# Patient Record
Sex: Male | Born: 1935 | Race: White | Hispanic: No | Smoking: Former smoker
Health system: Southern US, Community
[De-identification: ages and names within clinical notes are randomized; demographics above are authoritative.]

## PROBLEM LIST (undated history)

## (undated) DIAGNOSIS — G20A1 Parkinson's disease without dyskinesia, without mention of fluctuations: Secondary | ICD-10-CM

## (undated) DIAGNOSIS — J45909 Unspecified asthma, uncomplicated: Secondary | ICD-10-CM

## (undated) DIAGNOSIS — H9193 Unspecified hearing loss, bilateral: Secondary | ICD-10-CM

## (undated) DIAGNOSIS — M1990 Unspecified osteoarthritis, unspecified site: Secondary | ICD-10-CM

## (undated) DIAGNOSIS — J439 Emphysema, unspecified: Secondary | ICD-10-CM

## (undated) DIAGNOSIS — G20 Parkinson's disease: Secondary | ICD-10-CM

## (undated) DIAGNOSIS — E785 Hyperlipidemia, unspecified: Secondary | ICD-10-CM

## (undated) DIAGNOSIS — I10 Essential (primary) hypertension: Secondary | ICD-10-CM

## (undated) DIAGNOSIS — I2510 Atherosclerotic heart disease of native coronary artery without angina pectoris: Secondary | ICD-10-CM

## (undated) DIAGNOSIS — J849 Interstitial pulmonary disease, unspecified: Secondary | ICD-10-CM

## (undated) HISTORY — DX: Emphysema, unspecified: J43.9

## (undated) HISTORY — DX: Parkinson's disease without dyskinesia, without mention of fluctuations: G20.A1

## (undated) HISTORY — DX: Parkinson's disease: G20

## (undated) HISTORY — DX: Unspecified osteoarthritis, unspecified site: M19.90

## (undated) HISTORY — DX: Unspecified asthma, uncomplicated: J45.909

## (undated) HISTORY — DX: Unspecified hearing loss, bilateral: H91.93

## (undated) HISTORY — PX: CORONARY ANGIOPLASTY WITH STENT PLACEMENT: SHX49

## (undated) HISTORY — DX: Essential (primary) hypertension: I10

## (undated) HISTORY — DX: Interstitial pulmonary disease, unspecified: J84.9

## (undated) HISTORY — DX: Hyperlipidemia, unspecified: E78.5

## (undated) HISTORY — PX: VEIN BYPASS SURGERY: SHX833

---

## 1990-02-06 HISTORY — PX: CORONARY ARTERY BYPASS GRAFT: SHX141

## 2013-10-07 HISTORY — PX: HERNIA REPAIR: SHX51

## 2014-02-12 DIAGNOSIS — Z9861 Coronary angioplasty status: Secondary | ICD-10-CM | POA: Diagnosis not present

## 2014-02-12 DIAGNOSIS — I2510 Atherosclerotic heart disease of native coronary artery without angina pectoris: Secondary | ICD-10-CM | POA: Diagnosis not present

## 2014-02-16 DIAGNOSIS — Z9861 Coronary angioplasty status: Secondary | ICD-10-CM | POA: Diagnosis not present

## 2014-02-16 DIAGNOSIS — I2510 Atherosclerotic heart disease of native coronary artery without angina pectoris: Secondary | ICD-10-CM | POA: Diagnosis not present

## 2014-02-18 DIAGNOSIS — Z9861 Coronary angioplasty status: Secondary | ICD-10-CM | POA: Diagnosis not present

## 2014-02-18 DIAGNOSIS — I2510 Atherosclerotic heart disease of native coronary artery without angina pectoris: Secondary | ICD-10-CM | POA: Diagnosis not present

## 2014-02-20 DIAGNOSIS — Z9861 Coronary angioplasty status: Secondary | ICD-10-CM | POA: Diagnosis not present

## 2014-02-20 DIAGNOSIS — I2510 Atherosclerotic heart disease of native coronary artery without angina pectoris: Secondary | ICD-10-CM | POA: Diagnosis not present

## 2014-02-25 DIAGNOSIS — I2510 Atherosclerotic heart disease of native coronary artery without angina pectoris: Secondary | ICD-10-CM | POA: Diagnosis not present

## 2014-02-25 DIAGNOSIS — Z9861 Coronary angioplasty status: Secondary | ICD-10-CM | POA: Diagnosis not present

## 2014-03-02 DIAGNOSIS — J441 Chronic obstructive pulmonary disease with (acute) exacerbation: Secondary | ICD-10-CM | POA: Diagnosis not present

## 2014-03-02 DIAGNOSIS — J45909 Unspecified asthma, uncomplicated: Secondary | ICD-10-CM | POA: Diagnosis not present

## 2014-03-02 DIAGNOSIS — R000 Tachycardia, unspecified: Secondary | ICD-10-CM | POA: Diagnosis not present

## 2014-03-02 DIAGNOSIS — I2510 Atherosclerotic heart disease of native coronary artery without angina pectoris: Secondary | ICD-10-CM | POA: Diagnosis not present

## 2014-03-02 DIAGNOSIS — R0600 Dyspnea, unspecified: Secondary | ICD-10-CM | POA: Diagnosis not present

## 2014-03-02 DIAGNOSIS — R05 Cough: Secondary | ICD-10-CM | POA: Diagnosis not present

## 2014-03-02 DIAGNOSIS — J90 Pleural effusion, not elsewhere classified: Secondary | ICD-10-CM | POA: Diagnosis not present

## 2014-03-02 DIAGNOSIS — J9611 Chronic respiratory failure with hypoxia: Secondary | ICD-10-CM | POA: Diagnosis not present

## 2014-03-02 DIAGNOSIS — J9811 Atelectasis: Secondary | ICD-10-CM | POA: Diagnosis not present

## 2014-03-02 DIAGNOSIS — J449 Chronic obstructive pulmonary disease, unspecified: Secondary | ICD-10-CM | POA: Diagnosis not present

## 2014-03-02 DIAGNOSIS — R0602 Shortness of breath: Secondary | ICD-10-CM | POA: Diagnosis not present

## 2014-03-02 DIAGNOSIS — J984 Other disorders of lung: Secondary | ICD-10-CM | POA: Diagnosis not present

## 2014-03-02 DIAGNOSIS — R0902 Hypoxemia: Secondary | ICD-10-CM | POA: Diagnosis not present

## 2014-03-03 DIAGNOSIS — J90 Pleural effusion, not elsewhere classified: Secondary | ICD-10-CM | POA: Diagnosis present

## 2014-03-03 DIAGNOSIS — J441 Chronic obstructive pulmonary disease with (acute) exacerbation: Secondary | ICD-10-CM | POA: Diagnosis not present

## 2014-03-03 DIAGNOSIS — I2510 Atherosclerotic heart disease of native coronary artery without angina pectoris: Secondary | ICD-10-CM | POA: Diagnosis present

## 2014-03-03 DIAGNOSIS — I10 Essential (primary) hypertension: Secondary | ICD-10-CM | POA: Diagnosis present

## 2014-03-03 DIAGNOSIS — R05 Cough: Secondary | ICD-10-CM | POA: Diagnosis not present

## 2014-03-03 DIAGNOSIS — J40 Bronchitis, not specified as acute or chronic: Secondary | ICD-10-CM | POA: Diagnosis present

## 2014-03-03 DIAGNOSIS — J9690 Respiratory failure, unspecified, unspecified whether with hypoxia or hypercapnia: Secondary | ICD-10-CM | POA: Diagnosis not present

## 2014-03-03 DIAGNOSIS — J9811 Atelectasis: Secondary | ICD-10-CM | POA: Diagnosis present

## 2014-03-03 DIAGNOSIS — Z955 Presence of coronary angioplasty implant and graft: Secondary | ICD-10-CM | POA: Diagnosis not present

## 2014-03-03 DIAGNOSIS — J449 Chronic obstructive pulmonary disease, unspecified: Secondary | ICD-10-CM | POA: Diagnosis not present

## 2014-03-03 DIAGNOSIS — G20 Parkinson's disease: Secondary | ICD-10-CM | POA: Diagnosis present

## 2014-03-03 DIAGNOSIS — J9611 Chronic respiratory failure with hypoxia: Secondary | ICD-10-CM | POA: Diagnosis present

## 2014-03-03 DIAGNOSIS — J45909 Unspecified asthma, uncomplicated: Secondary | ICD-10-CM | POA: Diagnosis present

## 2014-03-03 DIAGNOSIS — R0902 Hypoxemia: Secondary | ICD-10-CM | POA: Diagnosis not present

## 2014-03-03 DIAGNOSIS — R0602 Shortness of breath: Secondary | ICD-10-CM | POA: Diagnosis not present

## 2014-03-03 DIAGNOSIS — I252 Old myocardial infarction: Secondary | ICD-10-CM | POA: Diagnosis not present

## 2014-03-03 DIAGNOSIS — Z951 Presence of aortocoronary bypass graft: Secondary | ICD-10-CM | POA: Diagnosis not present

## 2014-03-05 DIAGNOSIS — J441 Chronic obstructive pulmonary disease with (acute) exacerbation: Secondary | ICD-10-CM | POA: Diagnosis not present

## 2014-03-09 HISTORY — PX: CARPAL TUNNEL RELEASE: SHX101

## 2014-03-17 DIAGNOSIS — I2510 Atherosclerotic heart disease of native coronary artery without angina pectoris: Secondary | ICD-10-CM | POA: Diagnosis not present

## 2014-03-17 DIAGNOSIS — E782 Mixed hyperlipidemia: Secondary | ICD-10-CM | POA: Diagnosis not present

## 2014-03-17 DIAGNOSIS — R5383 Other fatigue: Secondary | ICD-10-CM | POA: Diagnosis not present

## 2014-03-17 DIAGNOSIS — Z955 Presence of coronary angioplasty implant and graft: Secondary | ICD-10-CM | POA: Diagnosis not present

## 2014-03-17 DIAGNOSIS — I10 Essential (primary) hypertension: Secondary | ICD-10-CM | POA: Diagnosis not present

## 2014-03-17 DIAGNOSIS — Z9861 Coronary angioplasty status: Secondary | ICD-10-CM | POA: Diagnosis not present

## 2014-03-23 DIAGNOSIS — J449 Chronic obstructive pulmonary disease, unspecified: Secondary | ICD-10-CM | POA: Diagnosis not present

## 2014-03-23 DIAGNOSIS — E785 Hyperlipidemia, unspecified: Secondary | ICD-10-CM | POA: Diagnosis not present

## 2014-04-02 DIAGNOSIS — J449 Chronic obstructive pulmonary disease, unspecified: Secondary | ICD-10-CM | POA: Diagnosis not present

## 2014-04-02 DIAGNOSIS — R0901 Asphyxia: Secondary | ICD-10-CM | POA: Diagnosis not present

## 2014-04-07 DIAGNOSIS — Z955 Presence of coronary angioplasty implant and graft: Secondary | ICD-10-CM | POA: Diagnosis not present

## 2014-04-07 DIAGNOSIS — E782 Mixed hyperlipidemia: Secondary | ICD-10-CM | POA: Diagnosis not present

## 2014-04-07 DIAGNOSIS — I2510 Atherosclerotic heart disease of native coronary artery without angina pectoris: Secondary | ICD-10-CM | POA: Diagnosis not present

## 2014-04-07 DIAGNOSIS — Z9861 Coronary angioplasty status: Secondary | ICD-10-CM | POA: Diagnosis not present

## 2014-04-07 DIAGNOSIS — I10 Essential (primary) hypertension: Secondary | ICD-10-CM | POA: Diagnosis not present

## 2014-04-09 DIAGNOSIS — H3532 Exudative age-related macular degeneration: Secondary | ICD-10-CM | POA: Diagnosis not present

## 2014-04-10 DIAGNOSIS — J449 Chronic obstructive pulmonary disease, unspecified: Secondary | ICD-10-CM | POA: Diagnosis not present

## 2014-04-10 DIAGNOSIS — Z7982 Long term (current) use of aspirin: Secondary | ICD-10-CM | POA: Diagnosis not present

## 2014-04-10 DIAGNOSIS — G20 Parkinson's disease: Secondary | ICD-10-CM | POA: Diagnosis not present

## 2014-04-10 DIAGNOSIS — Z7951 Long term (current) use of inhaled steroids: Secondary | ICD-10-CM | POA: Diagnosis not present

## 2014-04-10 DIAGNOSIS — Z955 Presence of coronary angioplasty implant and graft: Secondary | ICD-10-CM | POA: Diagnosis not present

## 2014-04-10 DIAGNOSIS — E039 Hypothyroidism, unspecified: Secondary | ICD-10-CM | POA: Diagnosis not present

## 2014-04-10 DIAGNOSIS — J45909 Unspecified asthma, uncomplicated: Secondary | ICD-10-CM | POA: Diagnosis not present

## 2014-04-10 DIAGNOSIS — Z7902 Long term (current) use of antithrombotics/antiplatelets: Secondary | ICD-10-CM | POA: Diagnosis not present

## 2014-04-10 DIAGNOSIS — M1990 Unspecified osteoarthritis, unspecified site: Secondary | ICD-10-CM | POA: Diagnosis not present

## 2014-04-10 DIAGNOSIS — I2510 Atherosclerotic heart disease of native coronary artery without angina pectoris: Secondary | ICD-10-CM | POA: Diagnosis not present

## 2014-04-10 DIAGNOSIS — I252 Old myocardial infarction: Secondary | ICD-10-CM | POA: Diagnosis not present

## 2014-04-10 DIAGNOSIS — I10 Essential (primary) hypertension: Secondary | ICD-10-CM | POA: Diagnosis not present

## 2014-04-10 DIAGNOSIS — J9611 Chronic respiratory failure with hypoxia: Secondary | ICD-10-CM | POA: Diagnosis not present

## 2014-04-10 DIAGNOSIS — Z951 Presence of aortocoronary bypass graft: Secondary | ICD-10-CM | POA: Diagnosis not present

## 2014-04-10 DIAGNOSIS — E785 Hyperlipidemia, unspecified: Secondary | ICD-10-CM | POA: Diagnosis not present

## 2014-04-10 DIAGNOSIS — Z9981 Dependence on supplemental oxygen: Secondary | ICD-10-CM | POA: Diagnosis not present

## 2014-04-13 DIAGNOSIS — G20 Parkinson's disease: Secondary | ICD-10-CM | POA: Diagnosis not present

## 2014-04-13 DIAGNOSIS — M1990 Unspecified osteoarthritis, unspecified site: Secondary | ICD-10-CM | POA: Diagnosis not present

## 2014-04-13 DIAGNOSIS — J9611 Chronic respiratory failure with hypoxia: Secondary | ICD-10-CM | POA: Diagnosis not present

## 2014-04-13 DIAGNOSIS — I2510 Atherosclerotic heart disease of native coronary artery without angina pectoris: Secondary | ICD-10-CM | POA: Diagnosis not present

## 2014-04-13 DIAGNOSIS — I10 Essential (primary) hypertension: Secondary | ICD-10-CM | POA: Diagnosis not present

## 2014-04-13 DIAGNOSIS — J449 Chronic obstructive pulmonary disease, unspecified: Secondary | ICD-10-CM | POA: Diagnosis not present

## 2014-04-14 DIAGNOSIS — J449 Chronic obstructive pulmonary disease, unspecified: Secondary | ICD-10-CM | POA: Diagnosis not present

## 2014-04-14 DIAGNOSIS — I2510 Atherosclerotic heart disease of native coronary artery without angina pectoris: Secondary | ICD-10-CM | POA: Diagnosis not present

## 2014-04-14 DIAGNOSIS — M1990 Unspecified osteoarthritis, unspecified site: Secondary | ICD-10-CM | POA: Diagnosis not present

## 2014-04-14 DIAGNOSIS — I10 Essential (primary) hypertension: Secondary | ICD-10-CM | POA: Diagnosis not present

## 2014-04-14 DIAGNOSIS — J9611 Chronic respiratory failure with hypoxia: Secondary | ICD-10-CM | POA: Diagnosis not present

## 2014-04-14 DIAGNOSIS — G20 Parkinson's disease: Secondary | ICD-10-CM | POA: Diagnosis not present

## 2014-04-17 DIAGNOSIS — I10 Essential (primary) hypertension: Secondary | ICD-10-CM | POA: Diagnosis not present

## 2014-04-17 DIAGNOSIS — J9611 Chronic respiratory failure with hypoxia: Secondary | ICD-10-CM | POA: Diagnosis not present

## 2014-04-17 DIAGNOSIS — G20 Parkinson's disease: Secondary | ICD-10-CM | POA: Diagnosis not present

## 2014-04-17 DIAGNOSIS — J449 Chronic obstructive pulmonary disease, unspecified: Secondary | ICD-10-CM | POA: Diagnosis not present

## 2014-04-17 DIAGNOSIS — I2510 Atherosclerotic heart disease of native coronary artery without angina pectoris: Secondary | ICD-10-CM | POA: Diagnosis not present

## 2014-04-17 DIAGNOSIS — M1990 Unspecified osteoarthritis, unspecified site: Secondary | ICD-10-CM | POA: Diagnosis not present

## 2014-04-20 DIAGNOSIS — G20 Parkinson's disease: Secondary | ICD-10-CM | POA: Diagnosis not present

## 2014-04-20 DIAGNOSIS — M1990 Unspecified osteoarthritis, unspecified site: Secondary | ICD-10-CM | POA: Diagnosis not present

## 2014-04-20 DIAGNOSIS — J449 Chronic obstructive pulmonary disease, unspecified: Secondary | ICD-10-CM | POA: Diagnosis not present

## 2014-04-20 DIAGNOSIS — I10 Essential (primary) hypertension: Secondary | ICD-10-CM | POA: Diagnosis not present

## 2014-04-20 DIAGNOSIS — I2510 Atherosclerotic heart disease of native coronary artery without angina pectoris: Secondary | ICD-10-CM | POA: Diagnosis not present

## 2014-04-20 DIAGNOSIS — J9611 Chronic respiratory failure with hypoxia: Secondary | ICD-10-CM | POA: Diagnosis not present

## 2014-04-21 DIAGNOSIS — Z9981 Dependence on supplemental oxygen: Secondary | ICD-10-CM | POA: Diagnosis not present

## 2014-04-21 DIAGNOSIS — R55 Syncope and collapse: Secondary | ICD-10-CM | POA: Diagnosis not present

## 2014-04-21 DIAGNOSIS — R1310 Dysphagia, unspecified: Secondary | ICD-10-CM | POA: Diagnosis not present

## 2014-04-21 DIAGNOSIS — R0602 Shortness of breath: Secondary | ICD-10-CM | POA: Diagnosis not present

## 2014-04-21 DIAGNOSIS — J9611 Chronic respiratory failure with hypoxia: Secondary | ICD-10-CM | POA: Diagnosis not present

## 2014-04-21 DIAGNOSIS — R072 Precordial pain: Secondary | ICD-10-CM | POA: Diagnosis not present

## 2014-04-21 DIAGNOSIS — J849 Interstitial pulmonary disease, unspecified: Secondary | ICD-10-CM | POA: Diagnosis not present

## 2014-04-21 DIAGNOSIS — D509 Iron deficiency anemia, unspecified: Secondary | ICD-10-CM | POA: Diagnosis not present

## 2014-04-22 DIAGNOSIS — Z7982 Long term (current) use of aspirin: Secondary | ICD-10-CM | POA: Diagnosis not present

## 2014-04-22 DIAGNOSIS — I480 Paroxysmal atrial fibrillation: Secondary | ICD-10-CM | POA: Diagnosis not present

## 2014-04-22 DIAGNOSIS — E039 Hypothyroidism, unspecified: Secondary | ICD-10-CM | POA: Diagnosis present

## 2014-04-22 DIAGNOSIS — R1310 Dysphagia, unspecified: Secondary | ICD-10-CM | POA: Diagnosis present

## 2014-04-22 DIAGNOSIS — J449 Chronic obstructive pulmonary disease, unspecified: Secondary | ICD-10-CM | POA: Diagnosis present

## 2014-04-22 DIAGNOSIS — D500 Iron deficiency anemia secondary to blood loss (chronic): Secondary | ICD-10-CM | POA: Diagnosis not present

## 2014-04-22 DIAGNOSIS — I6523 Occlusion and stenosis of bilateral carotid arteries: Secondary | ICD-10-CM | POA: Diagnosis not present

## 2014-04-22 DIAGNOSIS — Z7902 Long term (current) use of antithrombotics/antiplatelets: Secondary | ICD-10-CM | POA: Diagnosis not present

## 2014-04-22 DIAGNOSIS — R51 Headache: Secondary | ICD-10-CM | POA: Diagnosis not present

## 2014-04-22 DIAGNOSIS — Z79899 Other long term (current) drug therapy: Secondary | ICD-10-CM | POA: Diagnosis not present

## 2014-04-22 DIAGNOSIS — R062 Wheezing: Secondary | ICD-10-CM | POA: Diagnosis not present

## 2014-04-22 DIAGNOSIS — Z951 Presence of aortocoronary bypass graft: Secondary | ICD-10-CM | POA: Diagnosis not present

## 2014-04-22 DIAGNOSIS — J45909 Unspecified asthma, uncomplicated: Secondary | ICD-10-CM | POA: Diagnosis present

## 2014-04-22 DIAGNOSIS — G20 Parkinson's disease: Secondary | ICD-10-CM | POA: Diagnosis present

## 2014-04-22 DIAGNOSIS — Z955 Presence of coronary angioplasty implant and graft: Secondary | ICD-10-CM | POA: Diagnosis not present

## 2014-04-22 DIAGNOSIS — J9611 Chronic respiratory failure with hypoxia: Secondary | ICD-10-CM | POA: Diagnosis present

## 2014-04-22 DIAGNOSIS — J439 Emphysema, unspecified: Secondary | ICD-10-CM | POA: Diagnosis not present

## 2014-04-22 DIAGNOSIS — E119 Type 2 diabetes mellitus without complications: Secondary | ICD-10-CM | POA: Diagnosis present

## 2014-04-22 DIAGNOSIS — R55 Syncope and collapse: Secondary | ICD-10-CM | POA: Diagnosis not present

## 2014-04-22 DIAGNOSIS — E785 Hyperlipidemia, unspecified: Secondary | ICD-10-CM | POA: Diagnosis present

## 2014-04-22 DIAGNOSIS — Z9981 Dependence on supplemental oxygen: Secondary | ICD-10-CM | POA: Diagnosis not present

## 2014-04-22 DIAGNOSIS — I2510 Atherosclerotic heart disease of native coronary artery without angina pectoris: Secondary | ICD-10-CM | POA: Diagnosis present

## 2014-04-22 DIAGNOSIS — T17300A Unspecified foreign body in larynx causing asphyxiation, initial encounter: Secondary | ICD-10-CM | POA: Diagnosis not present

## 2014-04-22 DIAGNOSIS — D509 Iron deficiency anemia, unspecified: Secondary | ICD-10-CM | POA: Diagnosis present

## 2014-04-22 DIAGNOSIS — Z87891 Personal history of nicotine dependence: Secondary | ICD-10-CM | POA: Diagnosis not present

## 2014-04-22 DIAGNOSIS — Z888 Allergy status to other drugs, medicaments and biological substances status: Secondary | ICD-10-CM | POA: Diagnosis not present

## 2014-04-22 DIAGNOSIS — I252 Old myocardial infarction: Secondary | ICD-10-CM | POA: Diagnosis not present

## 2014-04-22 DIAGNOSIS — I10 Essential (primary) hypertension: Secondary | ICD-10-CM | POA: Diagnosis present

## 2014-04-22 DIAGNOSIS — R072 Precordial pain: Secondary | ICD-10-CM | POA: Diagnosis not present

## 2014-04-22 DIAGNOSIS — J849 Interstitial pulmonary disease, unspecified: Secondary | ICD-10-CM | POA: Diagnosis present

## 2014-04-22 DIAGNOSIS — J8410 Pulmonary fibrosis, unspecified: Secondary | ICD-10-CM | POA: Diagnosis not present

## 2014-04-26 DIAGNOSIS — G20 Parkinson's disease: Secondary | ICD-10-CM | POA: Diagnosis not present

## 2014-04-26 DIAGNOSIS — J449 Chronic obstructive pulmonary disease, unspecified: Secondary | ICD-10-CM | POA: Diagnosis not present

## 2014-04-26 DIAGNOSIS — M1990 Unspecified osteoarthritis, unspecified site: Secondary | ICD-10-CM | POA: Diagnosis not present

## 2014-04-26 DIAGNOSIS — J9611 Chronic respiratory failure with hypoxia: Secondary | ICD-10-CM | POA: Diagnosis not present

## 2014-04-26 DIAGNOSIS — I10 Essential (primary) hypertension: Secondary | ICD-10-CM | POA: Diagnosis not present

## 2014-04-26 DIAGNOSIS — I2510 Atherosclerotic heart disease of native coronary artery without angina pectoris: Secondary | ICD-10-CM | POA: Diagnosis not present

## 2014-04-28 DIAGNOSIS — M1990 Unspecified osteoarthritis, unspecified site: Secondary | ICD-10-CM | POA: Diagnosis not present

## 2014-04-28 DIAGNOSIS — G20 Parkinson's disease: Secondary | ICD-10-CM | POA: Diagnosis not present

## 2014-04-28 DIAGNOSIS — J449 Chronic obstructive pulmonary disease, unspecified: Secondary | ICD-10-CM | POA: Diagnosis not present

## 2014-04-28 DIAGNOSIS — J9611 Chronic respiratory failure with hypoxia: Secondary | ICD-10-CM | POA: Diagnosis not present

## 2014-04-28 DIAGNOSIS — I10 Essential (primary) hypertension: Secondary | ICD-10-CM | POA: Diagnosis not present

## 2014-04-28 DIAGNOSIS — I2510 Atherosclerotic heart disease of native coronary artery without angina pectoris: Secondary | ICD-10-CM | POA: Diagnosis not present

## 2014-04-29 DIAGNOSIS — I10 Essential (primary) hypertension: Secondary | ICD-10-CM | POA: Diagnosis not present

## 2014-04-29 DIAGNOSIS — G20 Parkinson's disease: Secondary | ICD-10-CM | POA: Diagnosis not present

## 2014-04-29 DIAGNOSIS — J111 Influenza due to unidentified influenza virus with other respiratory manifestations: Secondary | ICD-10-CM | POA: Diagnosis not present

## 2014-04-29 DIAGNOSIS — J449 Chronic obstructive pulmonary disease, unspecified: Secondary | ICD-10-CM | POA: Diagnosis not present

## 2014-04-29 DIAGNOSIS — R918 Other nonspecific abnormal finding of lung field: Secondary | ICD-10-CM | POA: Diagnosis not present

## 2014-04-29 DIAGNOSIS — M1990 Unspecified osteoarthritis, unspecified site: Secondary | ICD-10-CM | POA: Diagnosis not present

## 2014-04-29 DIAGNOSIS — R05 Cough: Secondary | ICD-10-CM | POA: Diagnosis not present

## 2014-04-29 DIAGNOSIS — R509 Fever, unspecified: Secondary | ICD-10-CM | POA: Diagnosis not present

## 2014-04-29 DIAGNOSIS — I2510 Atherosclerotic heart disease of native coronary artery without angina pectoris: Secondary | ICD-10-CM | POA: Diagnosis not present

## 2014-04-29 DIAGNOSIS — J9611 Chronic respiratory failure with hypoxia: Secondary | ICD-10-CM | POA: Diagnosis not present

## 2014-04-30 DIAGNOSIS — Z961 Presence of intraocular lens: Secondary | ICD-10-CM | POA: Diagnosis not present

## 2014-04-30 DIAGNOSIS — H3531 Nonexudative age-related macular degeneration: Secondary | ICD-10-CM | POA: Diagnosis not present

## 2014-04-30 DIAGNOSIS — J449 Chronic obstructive pulmonary disease, unspecified: Secondary | ICD-10-CM | POA: Diagnosis not present

## 2014-04-30 DIAGNOSIS — I2510 Atherosclerotic heart disease of native coronary artery without angina pectoris: Secondary | ICD-10-CM | POA: Diagnosis not present

## 2014-04-30 DIAGNOSIS — H3532 Exudative age-related macular degeneration: Secondary | ICD-10-CM | POA: Diagnosis not present

## 2014-04-30 DIAGNOSIS — H04123 Dry eye syndrome of bilateral lacrimal glands: Secondary | ICD-10-CM | POA: Diagnosis not present

## 2014-04-30 DIAGNOSIS — M1990 Unspecified osteoarthritis, unspecified site: Secondary | ICD-10-CM | POA: Diagnosis not present

## 2014-04-30 DIAGNOSIS — G20 Parkinson's disease: Secondary | ICD-10-CM | POA: Diagnosis not present

## 2014-04-30 DIAGNOSIS — J9611 Chronic respiratory failure with hypoxia: Secondary | ICD-10-CM | POA: Diagnosis not present

## 2014-04-30 DIAGNOSIS — I10 Essential (primary) hypertension: Secondary | ICD-10-CM | POA: Diagnosis not present

## 2014-05-01 DIAGNOSIS — I10 Essential (primary) hypertension: Secondary | ICD-10-CM | POA: Diagnosis not present

## 2014-05-01 DIAGNOSIS — J9611 Chronic respiratory failure with hypoxia: Secondary | ICD-10-CM | POA: Diagnosis not present

## 2014-05-01 DIAGNOSIS — I2510 Atherosclerotic heart disease of native coronary artery without angina pectoris: Secondary | ICD-10-CM | POA: Diagnosis not present

## 2014-05-01 DIAGNOSIS — G20 Parkinson's disease: Secondary | ICD-10-CM | POA: Diagnosis not present

## 2014-05-01 DIAGNOSIS — M1990 Unspecified osteoarthritis, unspecified site: Secondary | ICD-10-CM | POA: Diagnosis not present

## 2014-05-01 DIAGNOSIS — J449 Chronic obstructive pulmonary disease, unspecified: Secondary | ICD-10-CM | POA: Diagnosis not present

## 2014-05-02 DIAGNOSIS — I10 Essential (primary) hypertension: Secondary | ICD-10-CM | POA: Diagnosis not present

## 2014-05-02 DIAGNOSIS — G20 Parkinson's disease: Secondary | ICD-10-CM | POA: Diagnosis not present

## 2014-05-02 DIAGNOSIS — M1990 Unspecified osteoarthritis, unspecified site: Secondary | ICD-10-CM | POA: Diagnosis not present

## 2014-05-02 DIAGNOSIS — J449 Chronic obstructive pulmonary disease, unspecified: Secondary | ICD-10-CM | POA: Diagnosis not present

## 2014-05-02 DIAGNOSIS — I2510 Atherosclerotic heart disease of native coronary artery without angina pectoris: Secondary | ICD-10-CM | POA: Diagnosis not present

## 2014-05-02 DIAGNOSIS — J9611 Chronic respiratory failure with hypoxia: Secondary | ICD-10-CM | POA: Diagnosis not present

## 2014-05-04 DIAGNOSIS — J449 Chronic obstructive pulmonary disease, unspecified: Secondary | ICD-10-CM | POA: Diagnosis not present

## 2014-05-04 DIAGNOSIS — M1990 Unspecified osteoarthritis, unspecified site: Secondary | ICD-10-CM | POA: Diagnosis not present

## 2014-05-04 DIAGNOSIS — I2510 Atherosclerotic heart disease of native coronary artery without angina pectoris: Secondary | ICD-10-CM | POA: Diagnosis not present

## 2014-05-04 DIAGNOSIS — G20 Parkinson's disease: Secondary | ICD-10-CM | POA: Diagnosis not present

## 2014-05-04 DIAGNOSIS — I10 Essential (primary) hypertension: Secondary | ICD-10-CM | POA: Diagnosis not present

## 2014-05-04 DIAGNOSIS — J9611 Chronic respiratory failure with hypoxia: Secondary | ICD-10-CM | POA: Diagnosis not present

## 2014-05-05 DIAGNOSIS — I10 Essential (primary) hypertension: Secondary | ICD-10-CM | POA: Diagnosis not present

## 2014-05-05 DIAGNOSIS — G20 Parkinson's disease: Secondary | ICD-10-CM | POA: Diagnosis not present

## 2014-05-05 DIAGNOSIS — R0901 Asphyxia: Secondary | ICD-10-CM | POA: Diagnosis not present

## 2014-05-05 DIAGNOSIS — M1990 Unspecified osteoarthritis, unspecified site: Secondary | ICD-10-CM | POA: Diagnosis not present

## 2014-05-05 DIAGNOSIS — R0600 Dyspnea, unspecified: Secondary | ICD-10-CM | POA: Diagnosis not present

## 2014-05-05 DIAGNOSIS — I2510 Atherosclerotic heart disease of native coronary artery without angina pectoris: Secondary | ICD-10-CM | POA: Diagnosis not present

## 2014-05-05 DIAGNOSIS — J9611 Chronic respiratory failure with hypoxia: Secondary | ICD-10-CM | POA: Diagnosis not present

## 2014-05-05 DIAGNOSIS — J849 Interstitial pulmonary disease, unspecified: Secondary | ICD-10-CM | POA: Diagnosis not present

## 2014-05-05 DIAGNOSIS — J449 Chronic obstructive pulmonary disease, unspecified: Secondary | ICD-10-CM | POA: Diagnosis not present

## 2014-05-07 DIAGNOSIS — M1990 Unspecified osteoarthritis, unspecified site: Secondary | ICD-10-CM | POA: Diagnosis not present

## 2014-05-07 DIAGNOSIS — J449 Chronic obstructive pulmonary disease, unspecified: Secondary | ICD-10-CM | POA: Diagnosis not present

## 2014-05-07 DIAGNOSIS — I10 Essential (primary) hypertension: Secondary | ICD-10-CM | POA: Diagnosis not present

## 2014-05-07 DIAGNOSIS — G20 Parkinson's disease: Secondary | ICD-10-CM | POA: Diagnosis not present

## 2014-05-07 DIAGNOSIS — I2510 Atherosclerotic heart disease of native coronary artery without angina pectoris: Secondary | ICD-10-CM | POA: Diagnosis not present

## 2014-05-07 DIAGNOSIS — J9611 Chronic respiratory failure with hypoxia: Secondary | ICD-10-CM | POA: Diagnosis not present

## 2014-05-11 DIAGNOSIS — Z09 Encounter for follow-up examination after completed treatment for conditions other than malignant neoplasm: Secondary | ICD-10-CM | POA: Diagnosis not present

## 2014-05-11 DIAGNOSIS — G20 Parkinson's disease: Secondary | ICD-10-CM | POA: Diagnosis not present

## 2014-05-12 DIAGNOSIS — I2510 Atherosclerotic heart disease of native coronary artery without angina pectoris: Secondary | ICD-10-CM | POA: Diagnosis not present

## 2014-05-12 DIAGNOSIS — G20 Parkinson's disease: Secondary | ICD-10-CM | POA: Diagnosis not present

## 2014-05-12 DIAGNOSIS — J449 Chronic obstructive pulmonary disease, unspecified: Secondary | ICD-10-CM | POA: Diagnosis not present

## 2014-05-12 DIAGNOSIS — M1990 Unspecified osteoarthritis, unspecified site: Secondary | ICD-10-CM | POA: Diagnosis not present

## 2014-05-12 DIAGNOSIS — J9611 Chronic respiratory failure with hypoxia: Secondary | ICD-10-CM | POA: Diagnosis not present

## 2014-05-12 DIAGNOSIS — I10 Essential (primary) hypertension: Secondary | ICD-10-CM | POA: Diagnosis not present

## 2014-05-14 DIAGNOSIS — J9611 Chronic respiratory failure with hypoxia: Secondary | ICD-10-CM | POA: Diagnosis not present

## 2014-05-14 DIAGNOSIS — J449 Chronic obstructive pulmonary disease, unspecified: Secondary | ICD-10-CM | POA: Diagnosis not present

## 2014-05-14 DIAGNOSIS — G20 Parkinson's disease: Secondary | ICD-10-CM | POA: Diagnosis not present

## 2014-05-14 DIAGNOSIS — M1990 Unspecified osteoarthritis, unspecified site: Secondary | ICD-10-CM | POA: Diagnosis not present

## 2014-05-14 DIAGNOSIS — I2510 Atherosclerotic heart disease of native coronary artery without angina pectoris: Secondary | ICD-10-CM | POA: Diagnosis not present

## 2014-05-14 DIAGNOSIS — I10 Essential (primary) hypertension: Secondary | ICD-10-CM | POA: Diagnosis not present

## 2014-05-15 DIAGNOSIS — M1990 Unspecified osteoarthritis, unspecified site: Secondary | ICD-10-CM | POA: Diagnosis not present

## 2014-05-15 DIAGNOSIS — J9611 Chronic respiratory failure with hypoxia: Secondary | ICD-10-CM | POA: Diagnosis not present

## 2014-05-15 DIAGNOSIS — I2510 Atherosclerotic heart disease of native coronary artery without angina pectoris: Secondary | ICD-10-CM | POA: Diagnosis not present

## 2014-05-15 DIAGNOSIS — I10 Essential (primary) hypertension: Secondary | ICD-10-CM | POA: Diagnosis not present

## 2014-05-15 DIAGNOSIS — J449 Chronic obstructive pulmonary disease, unspecified: Secondary | ICD-10-CM | POA: Diagnosis not present

## 2014-05-15 DIAGNOSIS — G20 Parkinson's disease: Secondary | ICD-10-CM | POA: Diagnosis not present

## 2014-05-19 DIAGNOSIS — J449 Chronic obstructive pulmonary disease, unspecified: Secondary | ICD-10-CM | POA: Diagnosis not present

## 2014-05-19 DIAGNOSIS — I2510 Atherosclerotic heart disease of native coronary artery without angina pectoris: Secondary | ICD-10-CM | POA: Diagnosis not present

## 2014-05-19 DIAGNOSIS — J9611 Chronic respiratory failure with hypoxia: Secondary | ICD-10-CM | POA: Diagnosis not present

## 2014-05-19 DIAGNOSIS — G20 Parkinson's disease: Secondary | ICD-10-CM | POA: Diagnosis not present

## 2014-05-19 DIAGNOSIS — I10 Essential (primary) hypertension: Secondary | ICD-10-CM | POA: Diagnosis not present

## 2014-05-19 DIAGNOSIS — M1990 Unspecified osteoarthritis, unspecified site: Secondary | ICD-10-CM | POA: Diagnosis not present

## 2014-05-21 DIAGNOSIS — J449 Chronic obstructive pulmonary disease, unspecified: Secondary | ICD-10-CM | POA: Diagnosis not present

## 2014-05-21 DIAGNOSIS — M1990 Unspecified osteoarthritis, unspecified site: Secondary | ICD-10-CM | POA: Diagnosis not present

## 2014-05-21 DIAGNOSIS — J9611 Chronic respiratory failure with hypoxia: Secondary | ICD-10-CM | POA: Diagnosis not present

## 2014-05-21 DIAGNOSIS — G20 Parkinson's disease: Secondary | ICD-10-CM | POA: Diagnosis not present

## 2014-05-21 DIAGNOSIS — I2510 Atherosclerotic heart disease of native coronary artery without angina pectoris: Secondary | ICD-10-CM | POA: Diagnosis not present

## 2014-05-21 DIAGNOSIS — I10 Essential (primary) hypertension: Secondary | ICD-10-CM | POA: Diagnosis not present

## 2014-05-25 DIAGNOSIS — G20 Parkinson's disease: Secondary | ICD-10-CM | POA: Diagnosis not present

## 2014-05-25 DIAGNOSIS — I2510 Atherosclerotic heart disease of native coronary artery without angina pectoris: Secondary | ICD-10-CM | POA: Diagnosis not present

## 2014-05-25 DIAGNOSIS — E782 Mixed hyperlipidemia: Secondary | ICD-10-CM | POA: Diagnosis not present

## 2014-05-25 DIAGNOSIS — M545 Low back pain: Secondary | ICD-10-CM | POA: Diagnosis not present

## 2014-05-26 DIAGNOSIS — J449 Chronic obstructive pulmonary disease, unspecified: Secondary | ICD-10-CM | POA: Diagnosis not present

## 2014-05-26 DIAGNOSIS — I2510 Atherosclerotic heart disease of native coronary artery without angina pectoris: Secondary | ICD-10-CM | POA: Diagnosis not present

## 2014-05-26 DIAGNOSIS — I10 Essential (primary) hypertension: Secondary | ICD-10-CM | POA: Diagnosis not present

## 2014-05-26 DIAGNOSIS — G20 Parkinson's disease: Secondary | ICD-10-CM | POA: Diagnosis not present

## 2014-05-26 DIAGNOSIS — J9611 Chronic respiratory failure with hypoxia: Secondary | ICD-10-CM | POA: Diagnosis not present

## 2014-05-26 DIAGNOSIS — M1990 Unspecified osteoarthritis, unspecified site: Secondary | ICD-10-CM | POA: Diagnosis not present

## 2014-06-02 DIAGNOSIS — G20 Parkinson's disease: Secondary | ICD-10-CM | POA: Diagnosis not present

## 2014-06-02 DIAGNOSIS — I10 Essential (primary) hypertension: Secondary | ICD-10-CM | POA: Diagnosis not present

## 2014-06-02 DIAGNOSIS — I2510 Atherosclerotic heart disease of native coronary artery without angina pectoris: Secondary | ICD-10-CM | POA: Diagnosis not present

## 2014-06-02 DIAGNOSIS — M1990 Unspecified osteoarthritis, unspecified site: Secondary | ICD-10-CM | POA: Diagnosis not present

## 2014-06-02 DIAGNOSIS — J449 Chronic obstructive pulmonary disease, unspecified: Secondary | ICD-10-CM | POA: Diagnosis not present

## 2014-06-02 DIAGNOSIS — J9611 Chronic respiratory failure with hypoxia: Secondary | ICD-10-CM | POA: Diagnosis not present

## 2014-06-08 DIAGNOSIS — K4091 Unilateral inguinal hernia, without obstruction or gangrene, recurrent: Secondary | ICD-10-CM | POA: Diagnosis not present

## 2014-06-09 DIAGNOSIS — K4090 Unilateral inguinal hernia, without obstruction or gangrene, not specified as recurrent: Secondary | ICD-10-CM | POA: Diagnosis not present

## 2014-06-11 DIAGNOSIS — I2510 Atherosclerotic heart disease of native coronary artery without angina pectoris: Secondary | ICD-10-CM | POA: Diagnosis not present

## 2014-06-16 DIAGNOSIS — I2510 Atherosclerotic heart disease of native coronary artery without angina pectoris: Secondary | ICD-10-CM | POA: Diagnosis not present

## 2014-06-18 DIAGNOSIS — J84111 Idiopathic interstitial pneumonia, not otherwise specified: Secondary | ICD-10-CM | POA: Diagnosis not present

## 2014-06-18 DIAGNOSIS — R0600 Dyspnea, unspecified: Secondary | ICD-10-CM | POA: Diagnosis not present

## 2014-06-18 DIAGNOSIS — R05 Cough: Secondary | ICD-10-CM | POA: Diagnosis not present

## 2014-06-19 DIAGNOSIS — I2510 Atherosclerotic heart disease of native coronary artery without angina pectoris: Secondary | ICD-10-CM | POA: Diagnosis not present

## 2014-06-22 DIAGNOSIS — I2510 Atherosclerotic heart disease of native coronary artery without angina pectoris: Secondary | ICD-10-CM | POA: Diagnosis not present

## 2014-06-24 DIAGNOSIS — I2510 Atherosclerotic heart disease of native coronary artery without angina pectoris: Secondary | ICD-10-CM | POA: Diagnosis not present

## 2014-06-26 DIAGNOSIS — I2510 Atherosclerotic heart disease of native coronary artery without angina pectoris: Secondary | ICD-10-CM | POA: Diagnosis not present

## 2014-07-01 DIAGNOSIS — I2510 Atherosclerotic heart disease of native coronary artery without angina pectoris: Secondary | ICD-10-CM | POA: Diagnosis not present

## 2014-07-03 DIAGNOSIS — I2510 Atherosclerotic heart disease of native coronary artery without angina pectoris: Secondary | ICD-10-CM | POA: Diagnosis not present

## 2014-07-08 DIAGNOSIS — I2510 Atherosclerotic heart disease of native coronary artery without angina pectoris: Secondary | ICD-10-CM | POA: Diagnosis not present

## 2014-07-10 DIAGNOSIS — I2510 Atherosclerotic heart disease of native coronary artery without angina pectoris: Secondary | ICD-10-CM | POA: Diagnosis not present

## 2014-07-13 DIAGNOSIS — I2510 Atherosclerotic heart disease of native coronary artery without angina pectoris: Secondary | ICD-10-CM | POA: Diagnosis not present

## 2014-07-15 DIAGNOSIS — I2510 Atherosclerotic heart disease of native coronary artery without angina pectoris: Secondary | ICD-10-CM | POA: Diagnosis not present

## 2014-07-20 DIAGNOSIS — I2510 Atherosclerotic heart disease of native coronary artery without angina pectoris: Secondary | ICD-10-CM | POA: Diagnosis not present

## 2014-07-22 DIAGNOSIS — I2510 Atherosclerotic heart disease of native coronary artery without angina pectoris: Secondary | ICD-10-CM | POA: Diagnosis not present

## 2014-07-24 DIAGNOSIS — I2510 Atherosclerotic heart disease of native coronary artery without angina pectoris: Secondary | ICD-10-CM | POA: Diagnosis not present

## 2014-08-03 DIAGNOSIS — I2510 Atherosclerotic heart disease of native coronary artery without angina pectoris: Secondary | ICD-10-CM | POA: Diagnosis not present

## 2014-08-11 DIAGNOSIS — R1031 Right lower quadrant pain: Secondary | ICD-10-CM | POA: Diagnosis not present

## 2014-08-12 DIAGNOSIS — K439 Ventral hernia without obstruction or gangrene: Secondary | ICD-10-CM | POA: Diagnosis not present

## 2014-08-12 DIAGNOSIS — K4090 Unilateral inguinal hernia, without obstruction or gangrene, not specified as recurrent: Secondary | ICD-10-CM | POA: Diagnosis not present

## 2014-08-13 DIAGNOSIS — E782 Mixed hyperlipidemia: Secondary | ICD-10-CM | POA: Diagnosis not present

## 2014-08-13 DIAGNOSIS — I10 Essential (primary) hypertension: Secondary | ICD-10-CM | POA: Diagnosis not present

## 2014-08-13 DIAGNOSIS — Z951 Presence of aortocoronary bypass graft: Secondary | ICD-10-CM | POA: Diagnosis not present

## 2014-08-13 DIAGNOSIS — I2510 Atherosclerotic heart disease of native coronary artery without angina pectoris: Secondary | ICD-10-CM | POA: Diagnosis not present

## 2014-08-13 DIAGNOSIS — Z9861 Coronary angioplasty status: Secondary | ICD-10-CM | POA: Diagnosis not present

## 2014-08-20 DIAGNOSIS — I723 Aneurysm of iliac artery: Secondary | ICD-10-CM | POA: Diagnosis not present

## 2014-08-20 DIAGNOSIS — K5660 Unspecified intestinal obstruction: Secondary | ICD-10-CM | POA: Diagnosis not present

## 2014-08-20 DIAGNOSIS — K439 Ventral hernia without obstruction or gangrene: Secondary | ICD-10-CM | POA: Diagnosis not present

## 2014-08-20 DIAGNOSIS — K4090 Unilateral inguinal hernia, without obstruction or gangrene, not specified as recurrent: Secondary | ICD-10-CM | POA: Diagnosis not present

## 2014-09-14 DIAGNOSIS — R109 Unspecified abdominal pain: Secondary | ICD-10-CM | POA: Diagnosis not present

## 2014-09-14 DIAGNOSIS — K4000 Bilateral inguinal hernia, with obstruction, without gangrene, not specified as recurrent: Secondary | ICD-10-CM | POA: Diagnosis not present

## 2014-09-14 DIAGNOSIS — K5660 Unspecified intestinal obstruction: Secondary | ICD-10-CM | POA: Diagnosis not present

## 2014-09-14 DIAGNOSIS — Z79899 Other long term (current) drug therapy: Secondary | ICD-10-CM | POA: Diagnosis not present

## 2014-09-14 DIAGNOSIS — I2510 Atherosclerotic heart disease of native coronary artery without angina pectoris: Secondary | ICD-10-CM | POA: Diagnosis not present

## 2014-09-14 DIAGNOSIS — K420 Umbilical hernia with obstruction, without gangrene: Secondary | ICD-10-CM | POA: Diagnosis not present

## 2014-09-14 DIAGNOSIS — K5669 Other intestinal obstruction: Secondary | ICD-10-CM | POA: Diagnosis not present

## 2014-09-14 DIAGNOSIS — K436 Other and unspecified ventral hernia with obstruction, without gangrene: Secondary | ICD-10-CM | POA: Diagnosis not present

## 2014-09-14 DIAGNOSIS — Z87891 Personal history of nicotine dependence: Secondary | ICD-10-CM | POA: Diagnosis not present

## 2014-09-14 DIAGNOSIS — Z9861 Coronary angioplasty status: Secondary | ICD-10-CM | POA: Diagnosis not present

## 2014-09-14 DIAGNOSIS — G20 Parkinson's disease: Secondary | ICD-10-CM | POA: Diagnosis not present

## 2014-09-14 DIAGNOSIS — I252 Old myocardial infarction: Secondary | ICD-10-CM | POA: Diagnosis not present

## 2014-09-14 DIAGNOSIS — I10 Essential (primary) hypertension: Secondary | ICD-10-CM | POA: Diagnosis not present

## 2014-09-14 DIAGNOSIS — J449 Chronic obstructive pulmonary disease, unspecified: Secondary | ICD-10-CM | POA: Diagnosis not present

## 2014-09-14 DIAGNOSIS — J45909 Unspecified asthma, uncomplicated: Secondary | ICD-10-CM | POA: Diagnosis not present

## 2014-09-14 DIAGNOSIS — E039 Hypothyroidism, unspecified: Secondary | ICD-10-CM | POA: Diagnosis not present

## 2014-09-14 DIAGNOSIS — E785 Hyperlipidemia, unspecified: Secondary | ICD-10-CM | POA: Diagnosis not present

## 2014-09-14 DIAGNOSIS — J439 Emphysema, unspecified: Secondary | ICD-10-CM | POA: Diagnosis not present

## 2014-09-15 DIAGNOSIS — K439 Ventral hernia without obstruction or gangrene: Secondary | ICD-10-CM | POA: Diagnosis not present

## 2014-09-15 DIAGNOSIS — K4090 Unilateral inguinal hernia, without obstruction or gangrene, not specified as recurrent: Secondary | ICD-10-CM | POA: Diagnosis not present

## 2014-09-15 DIAGNOSIS — K436 Other and unspecified ventral hernia with obstruction, without gangrene: Secondary | ICD-10-CM | POA: Diagnosis not present

## 2014-09-15 DIAGNOSIS — I739 Peripheral vascular disease, unspecified: Secondary | ICD-10-CM | POA: Diagnosis not present

## 2014-09-15 DIAGNOSIS — K429 Umbilical hernia without obstruction or gangrene: Secondary | ICD-10-CM | POA: Diagnosis not present

## 2014-09-15 DIAGNOSIS — K5669 Other intestinal obstruction: Secondary | ICD-10-CM | POA: Diagnosis not present

## 2014-09-15 DIAGNOSIS — K5660 Unspecified intestinal obstruction: Secondary | ICD-10-CM | POA: Diagnosis not present

## 2014-09-15 DIAGNOSIS — I10 Essential (primary) hypertension: Secondary | ICD-10-CM | POA: Diagnosis not present

## 2014-09-15 DIAGNOSIS — I2510 Atherosclerotic heart disease of native coronary artery without angina pectoris: Secondary | ICD-10-CM | POA: Diagnosis not present

## 2014-09-15 DIAGNOSIS — R109 Unspecified abdominal pain: Secondary | ICD-10-CM | POA: Diagnosis not present

## 2014-09-15 DIAGNOSIS — R9431 Abnormal electrocardiogram [ECG] [EKG]: Secondary | ICD-10-CM | POA: Diagnosis not present

## 2014-09-21 DIAGNOSIS — K439 Ventral hernia without obstruction or gangrene: Secondary | ICD-10-CM | POA: Diagnosis not present

## 2014-09-21 DIAGNOSIS — E782 Mixed hyperlipidemia: Secondary | ICD-10-CM | POA: Diagnosis not present

## 2014-09-21 DIAGNOSIS — I2510 Atherosclerotic heart disease of native coronary artery without angina pectoris: Secondary | ICD-10-CM | POA: Diagnosis not present

## 2014-09-21 DIAGNOSIS — M545 Low back pain: Secondary | ICD-10-CM | POA: Diagnosis not present

## 2014-09-21 DIAGNOSIS — G20 Parkinson's disease: Secondary | ICD-10-CM | POA: Diagnosis not present

## 2014-09-21 DIAGNOSIS — G8929 Other chronic pain: Secondary | ICD-10-CM | POA: Diagnosis not present

## 2014-09-21 DIAGNOSIS — E039 Hypothyroidism, unspecified: Secondary | ICD-10-CM | POA: Diagnosis not present

## 2014-09-21 DIAGNOSIS — K4090 Unilateral inguinal hernia, without obstruction or gangrene, not specified as recurrent: Secondary | ICD-10-CM | POA: Diagnosis not present

## 2014-09-21 DIAGNOSIS — J9611 Chronic respiratory failure with hypoxia: Secondary | ICD-10-CM | POA: Diagnosis not present

## 2014-09-21 DIAGNOSIS — J410 Simple chronic bronchitis: Secondary | ICD-10-CM | POA: Diagnosis not present

## 2014-09-23 DIAGNOSIS — K4030 Unilateral inguinal hernia, with obstruction, without gangrene, not specified as recurrent: Secondary | ICD-10-CM | POA: Diagnosis not present

## 2014-10-01 DIAGNOSIS — M79604 Pain in right leg: Secondary | ICD-10-CM | POA: Diagnosis not present

## 2014-10-01 DIAGNOSIS — M79605 Pain in left leg: Secondary | ICD-10-CM | POA: Diagnosis not present

## 2014-10-01 DIAGNOSIS — I2510 Atherosclerotic heart disease of native coronary artery without angina pectoris: Secondary | ICD-10-CM | POA: Diagnosis not present

## 2014-10-01 DIAGNOSIS — Z01810 Encounter for preprocedural cardiovascular examination: Secondary | ICD-10-CM | POA: Diagnosis not present

## 2014-10-01 DIAGNOSIS — R262 Difficulty in walking, not elsewhere classified: Secondary | ICD-10-CM | POA: Diagnosis not present

## 2014-10-08 DIAGNOSIS — J849 Interstitial pulmonary disease, unspecified: Secondary | ICD-10-CM | POA: Diagnosis not present

## 2014-10-08 DIAGNOSIS — Z01811 Encounter for preprocedural respiratory examination: Secondary | ICD-10-CM | POA: Diagnosis not present

## 2014-10-13 DIAGNOSIS — R739 Hyperglycemia, unspecified: Secondary | ICD-10-CM | POA: Diagnosis not present

## 2014-10-13 DIAGNOSIS — I509 Heart failure, unspecified: Secondary | ICD-10-CM | POA: Diagnosis not present

## 2014-10-13 DIAGNOSIS — G8918 Other acute postprocedural pain: Secondary | ICD-10-CM | POA: Diagnosis not present

## 2014-10-13 DIAGNOSIS — G4700 Insomnia, unspecified: Secondary | ICD-10-CM | POA: Diagnosis not present

## 2014-10-13 DIAGNOSIS — Z9981 Dependence on supplemental oxygen: Secondary | ICD-10-CM | POA: Diagnosis not present

## 2014-10-13 DIAGNOSIS — R6510 Systemic inflammatory response syndrome (SIRS) of non-infectious origin without acute organ dysfunction: Secondary | ICD-10-CM | POA: Diagnosis not present

## 2014-10-13 DIAGNOSIS — Z951 Presence of aortocoronary bypass graft: Secondary | ICD-10-CM | POA: Diagnosis not present

## 2014-10-13 DIAGNOSIS — R5381 Other malaise: Secondary | ICD-10-CM | POA: Diagnosis not present

## 2014-10-13 DIAGNOSIS — E039 Hypothyroidism, unspecified: Secondary | ICD-10-CM | POA: Diagnosis not present

## 2014-10-13 DIAGNOSIS — K469 Unspecified abdominal hernia without obstruction or gangrene: Secondary | ICD-10-CM | POA: Diagnosis not present

## 2014-10-13 DIAGNOSIS — I10 Essential (primary) hypertension: Secondary | ICD-10-CM | POA: Diagnosis present

## 2014-10-13 DIAGNOSIS — N400 Enlarged prostate without lower urinary tract symptoms: Secondary | ICD-10-CM | POA: Diagnosis not present

## 2014-10-13 DIAGNOSIS — D638 Anemia in other chronic diseases classified elsewhere: Secondary | ICD-10-CM | POA: Diagnosis present

## 2014-10-13 DIAGNOSIS — I252 Old myocardial infarction: Secondary | ICD-10-CM | POA: Diagnosis not present

## 2014-10-13 DIAGNOSIS — I2510 Atherosclerotic heart disease of native coronary artery without angina pectoris: Secondary | ICD-10-CM | POA: Diagnosis present

## 2014-10-13 DIAGNOSIS — R1310 Dysphagia, unspecified: Secondary | ICD-10-CM | POA: Diagnosis present

## 2014-10-13 DIAGNOSIS — K429 Umbilical hernia without obstruction or gangrene: Secondary | ICD-10-CM | POA: Diagnosis not present

## 2014-10-13 DIAGNOSIS — H3530 Unspecified macular degeneration: Secondary | ICD-10-CM | POA: Diagnosis present

## 2014-10-13 DIAGNOSIS — J849 Interstitial pulmonary disease, unspecified: Secondary | ICD-10-CM | POA: Diagnosis not present

## 2014-10-13 DIAGNOSIS — R000 Tachycardia, unspecified: Secondary | ICD-10-CM | POA: Diagnosis not present

## 2014-10-13 DIAGNOSIS — R918 Other nonspecific abnormal finding of lung field: Secondary | ICD-10-CM | POA: Diagnosis not present

## 2014-10-13 DIAGNOSIS — E785 Hyperlipidemia, unspecified: Secondary | ICD-10-CM | POA: Diagnosis not present

## 2014-10-13 DIAGNOSIS — K439 Ventral hernia without obstruction or gangrene: Secondary | ICD-10-CM | POA: Diagnosis not present

## 2014-10-13 DIAGNOSIS — R5082 Postprocedural fever: Secondary | ICD-10-CM | POA: Diagnosis not present

## 2014-10-13 DIAGNOSIS — K430 Incisional hernia with obstruction, without gangrene: Secondary | ICD-10-CM | POA: Diagnosis not present

## 2014-10-13 DIAGNOSIS — J84115 Respiratory bronchiolitis interstitial lung disease: Secondary | ICD-10-CM | POA: Diagnosis not present

## 2014-10-13 DIAGNOSIS — M6281 Muscle weakness (generalized): Secondary | ICD-10-CM | POA: Diagnosis not present

## 2014-10-13 DIAGNOSIS — K4030 Unilateral inguinal hernia, with obstruction, without gangrene, not specified as recurrent: Secondary | ICD-10-CM | POA: Diagnosis not present

## 2014-10-13 DIAGNOSIS — Z87891 Personal history of nicotine dependence: Secondary | ICD-10-CM | POA: Diagnosis not present

## 2014-10-13 DIAGNOSIS — R0902 Hypoxemia: Secondary | ICD-10-CM | POA: Diagnosis not present

## 2014-10-13 DIAGNOSIS — Z4801 Encounter for change or removal of surgical wound dressing: Secondary | ICD-10-CM | POA: Diagnosis not present

## 2014-10-13 DIAGNOSIS — J9811 Atelectasis: Secondary | ICD-10-CM | POA: Diagnosis not present

## 2014-10-13 DIAGNOSIS — G20 Parkinson's disease: Secondary | ICD-10-CM | POA: Diagnosis present

## 2014-10-13 DIAGNOSIS — J449 Chronic obstructive pulmonary disease, unspecified: Secondary | ICD-10-CM | POA: Diagnosis not present

## 2014-10-13 DIAGNOSIS — Z7982 Long term (current) use of aspirin: Secondary | ICD-10-CM | POA: Diagnosis not present

## 2014-10-13 DIAGNOSIS — R0689 Other abnormalities of breathing: Secondary | ICD-10-CM | POA: Diagnosis not present

## 2014-10-13 DIAGNOSIS — R200 Anesthesia of skin: Secondary | ICD-10-CM | POA: Diagnosis not present

## 2014-10-13 DIAGNOSIS — J439 Emphysema, unspecified: Secondary | ICD-10-CM | POA: Diagnosis not present

## 2014-10-13 DIAGNOSIS — Z4889 Encounter for other specified surgical aftercare: Secondary | ICD-10-CM | POA: Diagnosis not present

## 2014-10-13 DIAGNOSIS — K4000 Bilateral inguinal hernia, with obstruction, without gangrene, not specified as recurrent: Secondary | ICD-10-CM | POA: Diagnosis not present

## 2014-10-13 DIAGNOSIS — D649 Anemia, unspecified: Secondary | ICD-10-CM | POA: Diagnosis not present

## 2014-10-13 DIAGNOSIS — K460 Unspecified abdominal hernia with obstruction, without gangrene: Secondary | ICD-10-CM | POA: Diagnosis not present

## 2014-10-16 DIAGNOSIS — G20 Parkinson's disease: Secondary | ICD-10-CM | POA: Diagnosis not present

## 2014-10-16 DIAGNOSIS — E039 Hypothyroidism, unspecified: Secondary | ICD-10-CM | POA: Diagnosis not present

## 2014-10-16 DIAGNOSIS — R531 Weakness: Secondary | ICD-10-CM | POA: Diagnosis not present

## 2014-10-16 DIAGNOSIS — K460 Unspecified abdominal hernia with obstruction, without gangrene: Secondary | ICD-10-CM | POA: Diagnosis not present

## 2014-10-16 DIAGNOSIS — R0902 Hypoxemia: Secondary | ICD-10-CM | POA: Diagnosis not present

## 2014-10-16 DIAGNOSIS — I2510 Atherosclerotic heart disease of native coronary artery without angina pectoris: Secondary | ICD-10-CM | POA: Diagnosis not present

## 2014-10-16 DIAGNOSIS — G629 Polyneuropathy, unspecified: Secondary | ICD-10-CM | POA: Diagnosis not present

## 2014-10-16 DIAGNOSIS — R000 Tachycardia, unspecified: Secondary | ICD-10-CM | POA: Diagnosis not present

## 2014-10-16 DIAGNOSIS — J439 Emphysema, unspecified: Secondary | ICD-10-CM | POA: Diagnosis not present

## 2014-10-16 DIAGNOSIS — R1310 Dysphagia, unspecified: Secondary | ICD-10-CM | POA: Diagnosis not present

## 2014-10-16 DIAGNOSIS — E785 Hyperlipidemia, unspecified: Secondary | ICD-10-CM | POA: Diagnosis not present

## 2014-10-16 DIAGNOSIS — N400 Enlarged prostate without lower urinary tract symptoms: Secondary | ICD-10-CM | POA: Diagnosis not present

## 2014-10-16 DIAGNOSIS — J329 Chronic sinusitis, unspecified: Secondary | ICD-10-CM | POA: Diagnosis not present

## 2014-10-16 DIAGNOSIS — Z5189 Encounter for other specified aftercare: Secondary | ICD-10-CM | POA: Diagnosis not present

## 2014-10-16 DIAGNOSIS — R5082 Postprocedural fever: Secondary | ICD-10-CM | POA: Diagnosis not present

## 2014-10-16 DIAGNOSIS — I509 Heart failure, unspecified: Secondary | ICD-10-CM | POA: Diagnosis not present

## 2014-10-16 DIAGNOSIS — Z4801 Encounter for change or removal of surgical wound dressing: Secondary | ICD-10-CM | POA: Diagnosis not present

## 2014-10-16 DIAGNOSIS — R2689 Other abnormalities of gait and mobility: Secondary | ICD-10-CM | POA: Diagnosis not present

## 2014-10-16 DIAGNOSIS — K4000 Bilateral inguinal hernia, with obstruction, without gangrene, not specified as recurrent: Secondary | ICD-10-CM | POA: Diagnosis not present

## 2014-10-16 DIAGNOSIS — I209 Angina pectoris, unspecified: Secondary | ICD-10-CM | POA: Diagnosis not present

## 2014-10-16 DIAGNOSIS — Z9981 Dependence on supplemental oxygen: Secondary | ICD-10-CM | POA: Diagnosis not present

## 2014-10-16 DIAGNOSIS — K5900 Constipation, unspecified: Secondary | ICD-10-CM | POA: Diagnosis not present

## 2014-10-16 DIAGNOSIS — M6281 Muscle weakness (generalized): Secondary | ICD-10-CM | POA: Diagnosis not present

## 2014-10-16 DIAGNOSIS — H3530 Unspecified macular degeneration: Secondary | ICD-10-CM | POA: Diagnosis not present

## 2014-10-16 DIAGNOSIS — K469 Unspecified abdominal hernia without obstruction or gangrene: Secondary | ICD-10-CM | POA: Diagnosis not present

## 2014-10-16 DIAGNOSIS — M79675 Pain in left toe(s): Secondary | ICD-10-CM | POA: Diagnosis not present

## 2014-10-16 DIAGNOSIS — J84115 Respiratory bronchiolitis interstitial lung disease: Secondary | ICD-10-CM | POA: Diagnosis not present

## 2014-10-16 DIAGNOSIS — M79674 Pain in right toe(s): Secondary | ICD-10-CM | POA: Diagnosis not present

## 2014-10-16 DIAGNOSIS — B351 Tinea unguium: Secondary | ICD-10-CM | POA: Diagnosis not present

## 2014-10-16 DIAGNOSIS — R0689 Other abnormalities of breathing: Secondary | ICD-10-CM | POA: Diagnosis not present

## 2014-10-16 DIAGNOSIS — J449 Chronic obstructive pulmonary disease, unspecified: Secondary | ICD-10-CM | POA: Diagnosis not present

## 2014-10-16 DIAGNOSIS — G4700 Insomnia, unspecified: Secondary | ICD-10-CM | POA: Diagnosis not present

## 2014-10-16 DIAGNOSIS — K439 Ventral hernia without obstruction or gangrene: Secondary | ICD-10-CM | POA: Diagnosis not present

## 2014-10-16 DIAGNOSIS — R918 Other nonspecific abnormal finding of lung field: Secondary | ICD-10-CM | POA: Diagnosis not present

## 2014-10-16 DIAGNOSIS — J849 Interstitial pulmonary disease, unspecified: Secondary | ICD-10-CM | POA: Diagnosis not present

## 2014-10-16 DIAGNOSIS — Z4889 Encounter for other specified surgical aftercare: Secondary | ICD-10-CM | POA: Diagnosis not present

## 2014-10-17 DIAGNOSIS — K5900 Constipation, unspecified: Secondary | ICD-10-CM | POA: Diagnosis not present

## 2014-10-17 DIAGNOSIS — G20 Parkinson's disease: Secondary | ICD-10-CM | POA: Diagnosis not present

## 2014-10-17 DIAGNOSIS — I209 Angina pectoris, unspecified: Secondary | ICD-10-CM | POA: Diagnosis not present

## 2014-10-17 DIAGNOSIS — J449 Chronic obstructive pulmonary disease, unspecified: Secondary | ICD-10-CM | POA: Diagnosis not present

## 2014-10-18 DIAGNOSIS — M79675 Pain in left toe(s): Secondary | ICD-10-CM | POA: Diagnosis not present

## 2014-10-18 DIAGNOSIS — B351 Tinea unguium: Secondary | ICD-10-CM | POA: Diagnosis not present

## 2014-10-18 DIAGNOSIS — M79674 Pain in right toe(s): Secondary | ICD-10-CM | POA: Diagnosis not present

## 2014-10-19 DIAGNOSIS — Z5189 Encounter for other specified aftercare: Secondary | ICD-10-CM | POA: Diagnosis not present

## 2014-10-19 DIAGNOSIS — G20 Parkinson's disease: Secondary | ICD-10-CM | POA: Diagnosis not present

## 2014-10-19 DIAGNOSIS — E039 Hypothyroidism, unspecified: Secondary | ICD-10-CM | POA: Diagnosis not present

## 2014-10-19 DIAGNOSIS — R531 Weakness: Secondary | ICD-10-CM | POA: Diagnosis not present

## 2014-10-23 DIAGNOSIS — Z5189 Encounter for other specified aftercare: Secondary | ICD-10-CM | POA: Diagnosis not present

## 2014-10-23 DIAGNOSIS — J849 Interstitial pulmonary disease, unspecified: Secondary | ICD-10-CM | POA: Diagnosis not present

## 2014-10-23 DIAGNOSIS — G629 Polyneuropathy, unspecified: Secondary | ICD-10-CM | POA: Diagnosis not present

## 2014-10-23 DIAGNOSIS — R2689 Other abnormalities of gait and mobility: Secondary | ICD-10-CM | POA: Diagnosis not present

## 2014-10-23 DIAGNOSIS — J449 Chronic obstructive pulmonary disease, unspecified: Secondary | ICD-10-CM | POA: Diagnosis not present

## 2014-10-23 DIAGNOSIS — G20 Parkinson's disease: Secondary | ICD-10-CM | POA: Diagnosis not present

## 2014-10-23 DIAGNOSIS — R0902 Hypoxemia: Secondary | ICD-10-CM | POA: Diagnosis not present

## 2014-10-23 DIAGNOSIS — Z9981 Dependence on supplemental oxygen: Secondary | ICD-10-CM | POA: Diagnosis not present

## 2014-10-25 DIAGNOSIS — J449 Chronic obstructive pulmonary disease, unspecified: Secondary | ICD-10-CM | POA: Diagnosis not present

## 2014-10-25 DIAGNOSIS — Z9981 Dependence on supplemental oxygen: Secondary | ICD-10-CM | POA: Diagnosis not present

## 2014-10-25 DIAGNOSIS — G20 Parkinson's disease: Secondary | ICD-10-CM | POA: Diagnosis not present

## 2014-10-25 DIAGNOSIS — Z5189 Encounter for other specified aftercare: Secondary | ICD-10-CM | POA: Diagnosis not present

## 2014-10-30 DIAGNOSIS — E039 Hypothyroidism, unspecified: Secondary | ICD-10-CM | POA: Diagnosis not present

## 2014-10-30 DIAGNOSIS — Z5189 Encounter for other specified aftercare: Secondary | ICD-10-CM | POA: Diagnosis not present

## 2014-10-30 DIAGNOSIS — G20 Parkinson's disease: Secondary | ICD-10-CM | POA: Diagnosis not present

## 2014-10-30 DIAGNOSIS — R531 Weakness: Secondary | ICD-10-CM | POA: Diagnosis not present

## 2014-11-01 DIAGNOSIS — Z9981 Dependence on supplemental oxygen: Secondary | ICD-10-CM | POA: Diagnosis not present

## 2014-11-01 DIAGNOSIS — J449 Chronic obstructive pulmonary disease, unspecified: Secondary | ICD-10-CM | POA: Diagnosis not present

## 2014-11-01 DIAGNOSIS — Z5189 Encounter for other specified aftercare: Secondary | ICD-10-CM | POA: Diagnosis not present

## 2014-11-01 DIAGNOSIS — G20 Parkinson's disease: Secondary | ICD-10-CM | POA: Diagnosis not present

## 2014-11-05 DIAGNOSIS — G20 Parkinson's disease: Secondary | ICD-10-CM | POA: Diagnosis not present

## 2014-11-05 DIAGNOSIS — R0902 Hypoxemia: Secondary | ICD-10-CM | POA: Diagnosis not present

## 2014-11-05 DIAGNOSIS — R2689 Other abnormalities of gait and mobility: Secondary | ICD-10-CM | POA: Diagnosis not present

## 2014-11-05 DIAGNOSIS — G629 Polyneuropathy, unspecified: Secondary | ICD-10-CM | POA: Diagnosis not present

## 2014-11-05 DIAGNOSIS — J849 Interstitial pulmonary disease, unspecified: Secondary | ICD-10-CM | POA: Diagnosis not present

## 2014-11-05 DIAGNOSIS — J449 Chronic obstructive pulmonary disease, unspecified: Secondary | ICD-10-CM | POA: Diagnosis not present

## 2014-11-07 DIAGNOSIS — J329 Chronic sinusitis, unspecified: Secondary | ICD-10-CM | POA: Diagnosis not present

## 2014-11-07 DIAGNOSIS — Z9981 Dependence on supplemental oxygen: Secondary | ICD-10-CM | POA: Diagnosis not present

## 2014-11-07 DIAGNOSIS — J449 Chronic obstructive pulmonary disease, unspecified: Secondary | ICD-10-CM | POA: Diagnosis not present

## 2014-11-07 DIAGNOSIS — G20 Parkinson's disease: Secondary | ICD-10-CM | POA: Diagnosis not present

## 2014-11-11 DIAGNOSIS — J449 Chronic obstructive pulmonary disease, unspecified: Secondary | ICD-10-CM | POA: Diagnosis not present

## 2014-11-11 DIAGNOSIS — M6281 Muscle weakness (generalized): Secondary | ICD-10-CM | POA: Diagnosis not present

## 2014-11-11 DIAGNOSIS — G20 Parkinson's disease: Secondary | ICD-10-CM | POA: Diagnosis not present

## 2014-11-11 DIAGNOSIS — I2510 Atherosclerotic heart disease of native coronary artery without angina pectoris: Secondary | ICD-10-CM | POA: Diagnosis not present

## 2014-11-11 DIAGNOSIS — E039 Hypothyroidism, unspecified: Secondary | ICD-10-CM | POA: Diagnosis not present

## 2014-11-11 DIAGNOSIS — I509 Heart failure, unspecified: Secondary | ICD-10-CM | POA: Diagnosis not present

## 2014-11-11 DIAGNOSIS — E785 Hyperlipidemia, unspecified: Secondary | ICD-10-CM | POA: Diagnosis not present

## 2014-11-11 DIAGNOSIS — Z9981 Dependence on supplemental oxygen: Secondary | ICD-10-CM | POA: Diagnosis not present

## 2014-11-12 DIAGNOSIS — I509 Heart failure, unspecified: Secondary | ICD-10-CM | POA: Diagnosis not present

## 2014-11-12 DIAGNOSIS — G20 Parkinson's disease: Secondary | ICD-10-CM | POA: Diagnosis not present

## 2014-11-12 DIAGNOSIS — R200 Anesthesia of skin: Secondary | ICD-10-CM | POA: Diagnosis not present

## 2014-11-12 DIAGNOSIS — Z9981 Dependence on supplemental oxygen: Secondary | ICD-10-CM | POA: Diagnosis not present

## 2014-11-12 DIAGNOSIS — I2510 Atherosclerotic heart disease of native coronary artery without angina pectoris: Secondary | ICD-10-CM | POA: Diagnosis not present

## 2014-11-12 DIAGNOSIS — Z09 Encounter for follow-up examination after completed treatment for conditions other than malignant neoplasm: Secondary | ICD-10-CM | POA: Diagnosis not present

## 2014-11-12 DIAGNOSIS — J449 Chronic obstructive pulmonary disease, unspecified: Secondary | ICD-10-CM | POA: Diagnosis not present

## 2014-11-12 DIAGNOSIS — M79601 Pain in right arm: Secondary | ICD-10-CM | POA: Diagnosis not present

## 2014-11-12 DIAGNOSIS — M6281 Muscle weakness (generalized): Secondary | ICD-10-CM | POA: Diagnosis not present

## 2014-11-13 DIAGNOSIS — Z9981 Dependence on supplemental oxygen: Secondary | ICD-10-CM | POA: Diagnosis not present

## 2014-11-13 DIAGNOSIS — I509 Heart failure, unspecified: Secondary | ICD-10-CM | POA: Diagnosis not present

## 2014-11-13 DIAGNOSIS — G20 Parkinson's disease: Secondary | ICD-10-CM | POA: Diagnosis not present

## 2014-11-13 DIAGNOSIS — M6281 Muscle weakness (generalized): Secondary | ICD-10-CM | POA: Diagnosis not present

## 2014-11-13 DIAGNOSIS — J449 Chronic obstructive pulmonary disease, unspecified: Secondary | ICD-10-CM | POA: Diagnosis not present

## 2014-11-13 DIAGNOSIS — I2510 Atherosclerotic heart disease of native coronary artery without angina pectoris: Secondary | ICD-10-CM | POA: Diagnosis not present

## 2014-11-14 DIAGNOSIS — I2510 Atherosclerotic heart disease of native coronary artery without angina pectoris: Secondary | ICD-10-CM | POA: Diagnosis not present

## 2014-11-14 DIAGNOSIS — M6281 Muscle weakness (generalized): Secondary | ICD-10-CM | POA: Diagnosis not present

## 2014-11-14 DIAGNOSIS — G20 Parkinson's disease: Secondary | ICD-10-CM | POA: Diagnosis not present

## 2014-11-14 DIAGNOSIS — J449 Chronic obstructive pulmonary disease, unspecified: Secondary | ICD-10-CM | POA: Diagnosis not present

## 2014-11-14 DIAGNOSIS — Z9981 Dependence on supplemental oxygen: Secondary | ICD-10-CM | POA: Diagnosis not present

## 2014-11-14 DIAGNOSIS — I509 Heart failure, unspecified: Secondary | ICD-10-CM | POA: Diagnosis not present

## 2014-11-16 DIAGNOSIS — Z9981 Dependence on supplemental oxygen: Secondary | ICD-10-CM | POA: Diagnosis not present

## 2014-11-16 DIAGNOSIS — I2510 Atherosclerotic heart disease of native coronary artery without angina pectoris: Secondary | ICD-10-CM | POA: Diagnosis not present

## 2014-11-16 DIAGNOSIS — I509 Heart failure, unspecified: Secondary | ICD-10-CM | POA: Diagnosis not present

## 2014-11-16 DIAGNOSIS — M6281 Muscle weakness (generalized): Secondary | ICD-10-CM | POA: Diagnosis not present

## 2014-11-16 DIAGNOSIS — G20 Parkinson's disease: Secondary | ICD-10-CM | POA: Diagnosis not present

## 2014-11-16 DIAGNOSIS — J449 Chronic obstructive pulmonary disease, unspecified: Secondary | ICD-10-CM | POA: Diagnosis not present

## 2014-11-17 DIAGNOSIS — J449 Chronic obstructive pulmonary disease, unspecified: Secondary | ICD-10-CM | POA: Diagnosis not present

## 2014-11-17 DIAGNOSIS — Z9981 Dependence on supplemental oxygen: Secondary | ICD-10-CM | POA: Diagnosis not present

## 2014-11-17 DIAGNOSIS — M6281 Muscle weakness (generalized): Secondary | ICD-10-CM | POA: Diagnosis not present

## 2014-11-17 DIAGNOSIS — I509 Heart failure, unspecified: Secondary | ICD-10-CM | POA: Diagnosis not present

## 2014-11-17 DIAGNOSIS — G20 Parkinson's disease: Secondary | ICD-10-CM | POA: Diagnosis not present

## 2014-11-17 DIAGNOSIS — I2510 Atherosclerotic heart disease of native coronary artery without angina pectoris: Secondary | ICD-10-CM | POA: Diagnosis not present

## 2014-11-18 DIAGNOSIS — Z9981 Dependence on supplemental oxygen: Secondary | ICD-10-CM | POA: Diagnosis not present

## 2014-11-18 DIAGNOSIS — I2510 Atherosclerotic heart disease of native coronary artery without angina pectoris: Secondary | ICD-10-CM | POA: Diagnosis not present

## 2014-11-18 DIAGNOSIS — M6281 Muscle weakness (generalized): Secondary | ICD-10-CM | POA: Diagnosis not present

## 2014-11-18 DIAGNOSIS — G20 Parkinson's disease: Secondary | ICD-10-CM | POA: Diagnosis not present

## 2014-11-18 DIAGNOSIS — I509 Heart failure, unspecified: Secondary | ICD-10-CM | POA: Diagnosis not present

## 2014-11-18 DIAGNOSIS — J449 Chronic obstructive pulmonary disease, unspecified: Secondary | ICD-10-CM | POA: Diagnosis not present

## 2014-11-19 DIAGNOSIS — M6281 Muscle weakness (generalized): Secondary | ICD-10-CM | POA: Diagnosis not present

## 2014-11-19 DIAGNOSIS — J449 Chronic obstructive pulmonary disease, unspecified: Secondary | ICD-10-CM | POA: Diagnosis not present

## 2014-11-19 DIAGNOSIS — Z9981 Dependence on supplemental oxygen: Secondary | ICD-10-CM | POA: Diagnosis not present

## 2014-11-19 DIAGNOSIS — I2510 Atherosclerotic heart disease of native coronary artery without angina pectoris: Secondary | ICD-10-CM | POA: Diagnosis not present

## 2014-11-19 DIAGNOSIS — G20 Parkinson's disease: Secondary | ICD-10-CM | POA: Diagnosis not present

## 2014-11-19 DIAGNOSIS — I509 Heart failure, unspecified: Secondary | ICD-10-CM | POA: Diagnosis not present

## 2014-11-23 DIAGNOSIS — M6281 Muscle weakness (generalized): Secondary | ICD-10-CM | POA: Diagnosis not present

## 2014-11-23 DIAGNOSIS — I2510 Atherosclerotic heart disease of native coronary artery without angina pectoris: Secondary | ICD-10-CM | POA: Diagnosis not present

## 2014-11-23 DIAGNOSIS — Z9981 Dependence on supplemental oxygen: Secondary | ICD-10-CM | POA: Diagnosis not present

## 2014-11-23 DIAGNOSIS — I509 Heart failure, unspecified: Secondary | ICD-10-CM | POA: Diagnosis not present

## 2014-11-23 DIAGNOSIS — G20 Parkinson's disease: Secondary | ICD-10-CM | POA: Diagnosis not present

## 2014-11-23 DIAGNOSIS — J449 Chronic obstructive pulmonary disease, unspecified: Secondary | ICD-10-CM | POA: Diagnosis not present

## 2014-11-24 DIAGNOSIS — Z9981 Dependence on supplemental oxygen: Secondary | ICD-10-CM | POA: Diagnosis not present

## 2014-11-24 DIAGNOSIS — J449 Chronic obstructive pulmonary disease, unspecified: Secondary | ICD-10-CM | POA: Diagnosis not present

## 2014-11-24 DIAGNOSIS — M6281 Muscle weakness (generalized): Secondary | ICD-10-CM | POA: Diagnosis not present

## 2014-11-24 DIAGNOSIS — I2510 Atherosclerotic heart disease of native coronary artery without angina pectoris: Secondary | ICD-10-CM | POA: Diagnosis not present

## 2014-11-24 DIAGNOSIS — I509 Heart failure, unspecified: Secondary | ICD-10-CM | POA: Diagnosis not present

## 2014-11-24 DIAGNOSIS — G20 Parkinson's disease: Secondary | ICD-10-CM | POA: Diagnosis not present

## 2014-11-25 DIAGNOSIS — Z9981 Dependence on supplemental oxygen: Secondary | ICD-10-CM | POA: Diagnosis not present

## 2014-11-25 DIAGNOSIS — I509 Heart failure, unspecified: Secondary | ICD-10-CM | POA: Diagnosis not present

## 2014-11-25 DIAGNOSIS — G20 Parkinson's disease: Secondary | ICD-10-CM | POA: Diagnosis not present

## 2014-11-25 DIAGNOSIS — M6281 Muscle weakness (generalized): Secondary | ICD-10-CM | POA: Diagnosis not present

## 2014-11-25 DIAGNOSIS — G608 Other hereditary and idiopathic neuropathies: Secondary | ICD-10-CM | POA: Diagnosis not present

## 2014-11-25 DIAGNOSIS — J449 Chronic obstructive pulmonary disease, unspecified: Secondary | ICD-10-CM | POA: Diagnosis not present

## 2014-11-25 DIAGNOSIS — I2510 Atherosclerotic heart disease of native coronary artery without angina pectoris: Secondary | ICD-10-CM | POA: Diagnosis not present

## 2014-11-25 DIAGNOSIS — R208 Other disturbances of skin sensation: Secondary | ICD-10-CM | POA: Diagnosis not present

## 2014-11-25 DIAGNOSIS — G5603 Carpal tunnel syndrome, bilateral upper limbs: Secondary | ICD-10-CM | POA: Diagnosis not present

## 2014-11-26 DIAGNOSIS — I509 Heart failure, unspecified: Secondary | ICD-10-CM | POA: Diagnosis not present

## 2014-11-26 DIAGNOSIS — M6281 Muscle weakness (generalized): Secondary | ICD-10-CM | POA: Diagnosis not present

## 2014-11-26 DIAGNOSIS — Z9981 Dependence on supplemental oxygen: Secondary | ICD-10-CM | POA: Diagnosis not present

## 2014-11-26 DIAGNOSIS — I2510 Atherosclerotic heart disease of native coronary artery without angina pectoris: Secondary | ICD-10-CM | POA: Diagnosis not present

## 2014-11-26 DIAGNOSIS — J449 Chronic obstructive pulmonary disease, unspecified: Secondary | ICD-10-CM | POA: Diagnosis not present

## 2014-11-26 DIAGNOSIS — G20 Parkinson's disease: Secondary | ICD-10-CM | POA: Diagnosis not present

## 2014-11-27 DIAGNOSIS — K436 Other and unspecified ventral hernia with obstruction, without gangrene: Secondary | ICD-10-CM | POA: Diagnosis not present

## 2014-11-27 DIAGNOSIS — I2510 Atherosclerotic heart disease of native coronary artery without angina pectoris: Secondary | ICD-10-CM | POA: Diagnosis not present

## 2014-11-27 DIAGNOSIS — Z23 Encounter for immunization: Secondary | ICD-10-CM | POA: Diagnosis not present

## 2014-11-27 DIAGNOSIS — K429 Umbilical hernia without obstruction or gangrene: Secondary | ICD-10-CM | POA: Diagnosis not present

## 2014-11-27 DIAGNOSIS — R1084 Generalized abdominal pain: Secondary | ICD-10-CM | POA: Diagnosis not present

## 2014-11-30 DIAGNOSIS — J449 Chronic obstructive pulmonary disease, unspecified: Secondary | ICD-10-CM | POA: Diagnosis not present

## 2014-11-30 DIAGNOSIS — I509 Heart failure, unspecified: Secondary | ICD-10-CM | POA: Diagnosis not present

## 2014-11-30 DIAGNOSIS — M6281 Muscle weakness (generalized): Secondary | ICD-10-CM | POA: Diagnosis not present

## 2014-11-30 DIAGNOSIS — G20 Parkinson's disease: Secondary | ICD-10-CM | POA: Diagnosis not present

## 2014-11-30 DIAGNOSIS — I2510 Atherosclerotic heart disease of native coronary artery without angina pectoris: Secondary | ICD-10-CM | POA: Diagnosis not present

## 2014-11-30 DIAGNOSIS — Z9981 Dependence on supplemental oxygen: Secondary | ICD-10-CM | POA: Diagnosis not present

## 2014-12-01 DIAGNOSIS — Z9981 Dependence on supplemental oxygen: Secondary | ICD-10-CM | POA: Diagnosis not present

## 2014-12-01 DIAGNOSIS — M6281 Muscle weakness (generalized): Secondary | ICD-10-CM | POA: Diagnosis not present

## 2014-12-01 DIAGNOSIS — I509 Heart failure, unspecified: Secondary | ICD-10-CM | POA: Diagnosis not present

## 2014-12-01 DIAGNOSIS — J449 Chronic obstructive pulmonary disease, unspecified: Secondary | ICD-10-CM | POA: Diagnosis not present

## 2014-12-01 DIAGNOSIS — I2510 Atherosclerotic heart disease of native coronary artery without angina pectoris: Secondary | ICD-10-CM | POA: Diagnosis not present

## 2014-12-01 DIAGNOSIS — G20 Parkinson's disease: Secondary | ICD-10-CM | POA: Diagnosis not present

## 2014-12-02 DIAGNOSIS — G20 Parkinson's disease: Secondary | ICD-10-CM | POA: Diagnosis not present

## 2014-12-02 DIAGNOSIS — M6281 Muscle weakness (generalized): Secondary | ICD-10-CM | POA: Diagnosis not present

## 2014-12-02 DIAGNOSIS — Z9981 Dependence on supplemental oxygen: Secondary | ICD-10-CM | POA: Diagnosis not present

## 2014-12-02 DIAGNOSIS — J449 Chronic obstructive pulmonary disease, unspecified: Secondary | ICD-10-CM | POA: Diagnosis not present

## 2014-12-02 DIAGNOSIS — I509 Heart failure, unspecified: Secondary | ICD-10-CM | POA: Diagnosis not present

## 2014-12-02 DIAGNOSIS — I2510 Atherosclerotic heart disease of native coronary artery without angina pectoris: Secondary | ICD-10-CM | POA: Diagnosis not present

## 2014-12-03 DIAGNOSIS — G20 Parkinson's disease: Secondary | ICD-10-CM | POA: Diagnosis not present

## 2014-12-03 DIAGNOSIS — I509 Heart failure, unspecified: Secondary | ICD-10-CM | POA: Diagnosis not present

## 2014-12-03 DIAGNOSIS — I2510 Atherosclerotic heart disease of native coronary artery without angina pectoris: Secondary | ICD-10-CM | POA: Diagnosis not present

## 2014-12-03 DIAGNOSIS — J449 Chronic obstructive pulmonary disease, unspecified: Secondary | ICD-10-CM | POA: Diagnosis not present

## 2014-12-03 DIAGNOSIS — M6281 Muscle weakness (generalized): Secondary | ICD-10-CM | POA: Diagnosis not present

## 2014-12-03 DIAGNOSIS — Z9981 Dependence on supplemental oxygen: Secondary | ICD-10-CM | POA: Diagnosis not present

## 2014-12-07 DIAGNOSIS — G20 Parkinson's disease: Secondary | ICD-10-CM | POA: Diagnosis not present

## 2014-12-07 DIAGNOSIS — I509 Heart failure, unspecified: Secondary | ICD-10-CM | POA: Diagnosis not present

## 2014-12-07 DIAGNOSIS — J449 Chronic obstructive pulmonary disease, unspecified: Secondary | ICD-10-CM | POA: Diagnosis not present

## 2014-12-07 DIAGNOSIS — M6281 Muscle weakness (generalized): Secondary | ICD-10-CM | POA: Diagnosis not present

## 2014-12-07 DIAGNOSIS — Z9981 Dependence on supplemental oxygen: Secondary | ICD-10-CM | POA: Diagnosis not present

## 2014-12-07 DIAGNOSIS — I2510 Atherosclerotic heart disease of native coronary artery without angina pectoris: Secondary | ICD-10-CM | POA: Diagnosis not present

## 2014-12-09 DIAGNOSIS — I509 Heart failure, unspecified: Secondary | ICD-10-CM | POA: Diagnosis not present

## 2014-12-09 DIAGNOSIS — G20 Parkinson's disease: Secondary | ICD-10-CM | POA: Diagnosis not present

## 2014-12-09 DIAGNOSIS — J449 Chronic obstructive pulmonary disease, unspecified: Secondary | ICD-10-CM | POA: Diagnosis not present

## 2014-12-09 DIAGNOSIS — I2510 Atherosclerotic heart disease of native coronary artery without angina pectoris: Secondary | ICD-10-CM | POA: Diagnosis not present

## 2014-12-09 DIAGNOSIS — M6281 Muscle weakness (generalized): Secondary | ICD-10-CM | POA: Diagnosis not present

## 2014-12-09 DIAGNOSIS — Z9981 Dependence on supplemental oxygen: Secondary | ICD-10-CM | POA: Diagnosis not present

## 2014-12-10 DIAGNOSIS — G20 Parkinson's disease: Secondary | ICD-10-CM | POA: Diagnosis not present

## 2014-12-10 DIAGNOSIS — I2510 Atherosclerotic heart disease of native coronary artery without angina pectoris: Secondary | ICD-10-CM | POA: Diagnosis not present

## 2014-12-10 DIAGNOSIS — M6281 Muscle weakness (generalized): Secondary | ICD-10-CM | POA: Diagnosis not present

## 2014-12-10 DIAGNOSIS — J449 Chronic obstructive pulmonary disease, unspecified: Secondary | ICD-10-CM | POA: Diagnosis not present

## 2014-12-10 DIAGNOSIS — Z9981 Dependence on supplemental oxygen: Secondary | ICD-10-CM | POA: Diagnosis not present

## 2014-12-10 DIAGNOSIS — I509 Heart failure, unspecified: Secondary | ICD-10-CM | POA: Diagnosis not present

## 2014-12-11 DIAGNOSIS — J322 Chronic ethmoidal sinusitis: Secondary | ICD-10-CM | POA: Diagnosis not present

## 2014-12-11 DIAGNOSIS — J320 Chronic maxillary sinusitis: Secondary | ICD-10-CM | POA: Diagnosis not present

## 2014-12-15 DIAGNOSIS — J449 Chronic obstructive pulmonary disease, unspecified: Secondary | ICD-10-CM | POA: Diagnosis not present

## 2014-12-15 DIAGNOSIS — Z9981 Dependence on supplemental oxygen: Secondary | ICD-10-CM | POA: Diagnosis not present

## 2014-12-15 DIAGNOSIS — I2510 Atherosclerotic heart disease of native coronary artery without angina pectoris: Secondary | ICD-10-CM | POA: Diagnosis not present

## 2014-12-15 DIAGNOSIS — G20 Parkinson's disease: Secondary | ICD-10-CM | POA: Diagnosis not present

## 2014-12-15 DIAGNOSIS — M6281 Muscle weakness (generalized): Secondary | ICD-10-CM | POA: Diagnosis not present

## 2014-12-15 DIAGNOSIS — I509 Heart failure, unspecified: Secondary | ICD-10-CM | POA: Diagnosis not present

## 2014-12-17 DIAGNOSIS — I509 Heart failure, unspecified: Secondary | ICD-10-CM | POA: Diagnosis not present

## 2014-12-17 DIAGNOSIS — G20 Parkinson's disease: Secondary | ICD-10-CM | POA: Diagnosis not present

## 2014-12-17 DIAGNOSIS — M6281 Muscle weakness (generalized): Secondary | ICD-10-CM | POA: Diagnosis not present

## 2014-12-17 DIAGNOSIS — Z9981 Dependence on supplemental oxygen: Secondary | ICD-10-CM | POA: Diagnosis not present

## 2014-12-17 DIAGNOSIS — I2510 Atherosclerotic heart disease of native coronary artery without angina pectoris: Secondary | ICD-10-CM | POA: Diagnosis not present

## 2014-12-17 DIAGNOSIS — J449 Chronic obstructive pulmonary disease, unspecified: Secondary | ICD-10-CM | POA: Diagnosis not present

## 2014-12-18 DIAGNOSIS — G20 Parkinson's disease: Secondary | ICD-10-CM | POA: Diagnosis not present

## 2014-12-18 DIAGNOSIS — I509 Heart failure, unspecified: Secondary | ICD-10-CM | POA: Diagnosis not present

## 2014-12-18 DIAGNOSIS — Z9981 Dependence on supplemental oxygen: Secondary | ICD-10-CM | POA: Diagnosis not present

## 2014-12-18 DIAGNOSIS — M6281 Muscle weakness (generalized): Secondary | ICD-10-CM | POA: Diagnosis not present

## 2014-12-18 DIAGNOSIS — J449 Chronic obstructive pulmonary disease, unspecified: Secondary | ICD-10-CM | POA: Diagnosis not present

## 2014-12-18 DIAGNOSIS — I2510 Atherosclerotic heart disease of native coronary artery without angina pectoris: Secondary | ICD-10-CM | POA: Diagnosis not present

## 2014-12-21 DIAGNOSIS — K5669 Other intestinal obstruction: Secondary | ICD-10-CM | POA: Diagnosis not present

## 2014-12-21 DIAGNOSIS — I2510 Atherosclerotic heart disease of native coronary artery without angina pectoris: Secondary | ICD-10-CM | POA: Diagnosis not present

## 2014-12-21 DIAGNOSIS — E039 Hypothyroidism, unspecified: Secondary | ICD-10-CM | POA: Diagnosis not present

## 2014-12-21 DIAGNOSIS — N401 Enlarged prostate with lower urinary tract symptoms: Secondary | ICD-10-CM | POA: Diagnosis not present

## 2014-12-22 DIAGNOSIS — I509 Heart failure, unspecified: Secondary | ICD-10-CM | POA: Diagnosis not present

## 2014-12-22 DIAGNOSIS — F5101 Primary insomnia: Secondary | ICD-10-CM | POA: Diagnosis not present

## 2014-12-22 DIAGNOSIS — J449 Chronic obstructive pulmonary disease, unspecified: Secondary | ICD-10-CM | POA: Diagnosis not present

## 2014-12-22 DIAGNOSIS — M6281 Muscle weakness (generalized): Secondary | ICD-10-CM | POA: Diagnosis not present

## 2014-12-22 DIAGNOSIS — G20 Parkinson's disease: Secondary | ICD-10-CM | POA: Diagnosis not present

## 2014-12-22 DIAGNOSIS — F419 Anxiety disorder, unspecified: Secondary | ICD-10-CM | POA: Diagnosis not present

## 2014-12-22 DIAGNOSIS — I2510 Atherosclerotic heart disease of native coronary artery without angina pectoris: Secondary | ICD-10-CM | POA: Diagnosis not present

## 2014-12-22 DIAGNOSIS — Z9981 Dependence on supplemental oxygen: Secondary | ICD-10-CM | POA: Diagnosis not present

## 2014-12-24 DIAGNOSIS — J449 Chronic obstructive pulmonary disease, unspecified: Secondary | ICD-10-CM | POA: Diagnosis not present

## 2014-12-24 DIAGNOSIS — I509 Heart failure, unspecified: Secondary | ICD-10-CM | POA: Diagnosis not present

## 2014-12-24 DIAGNOSIS — I2510 Atherosclerotic heart disease of native coronary artery without angina pectoris: Secondary | ICD-10-CM | POA: Diagnosis not present

## 2014-12-24 DIAGNOSIS — M6281 Muscle weakness (generalized): Secondary | ICD-10-CM | POA: Diagnosis not present

## 2014-12-24 DIAGNOSIS — G20 Parkinson's disease: Secondary | ICD-10-CM | POA: Diagnosis not present

## 2014-12-24 DIAGNOSIS — Z9981 Dependence on supplemental oxygen: Secondary | ICD-10-CM | POA: Diagnosis not present

## 2014-12-25 DIAGNOSIS — I509 Heart failure, unspecified: Secondary | ICD-10-CM | POA: Diagnosis not present

## 2014-12-25 DIAGNOSIS — G20 Parkinson's disease: Secondary | ICD-10-CM | POA: Diagnosis not present

## 2014-12-25 DIAGNOSIS — M6281 Muscle weakness (generalized): Secondary | ICD-10-CM | POA: Diagnosis not present

## 2014-12-25 DIAGNOSIS — I2510 Atherosclerotic heart disease of native coronary artery without angina pectoris: Secondary | ICD-10-CM | POA: Diagnosis not present

## 2014-12-25 DIAGNOSIS — J449 Chronic obstructive pulmonary disease, unspecified: Secondary | ICD-10-CM | POA: Diagnosis not present

## 2014-12-25 DIAGNOSIS — Z9981 Dependence on supplemental oxygen: Secondary | ICD-10-CM | POA: Diagnosis not present

## 2014-12-27 DIAGNOSIS — G20 Parkinson's disease: Secondary | ICD-10-CM | POA: Diagnosis not present

## 2014-12-27 DIAGNOSIS — E785 Hyperlipidemia, unspecified: Secondary | ICD-10-CM | POA: Diagnosis not present

## 2014-12-27 DIAGNOSIS — I2510 Atherosclerotic heart disease of native coronary artery without angina pectoris: Secondary | ICD-10-CM | POA: Diagnosis not present

## 2014-12-27 DIAGNOSIS — F329 Major depressive disorder, single episode, unspecified: Secondary | ICD-10-CM | POA: Diagnosis not present

## 2014-12-27 DIAGNOSIS — E039 Hypothyroidism, unspecified: Secondary | ICD-10-CM | POA: Diagnosis not present

## 2014-12-27 DIAGNOSIS — J449 Chronic obstructive pulmonary disease, unspecified: Secondary | ICD-10-CM | POA: Diagnosis not present

## 2014-12-27 DIAGNOSIS — R040 Epistaxis: Secondary | ICD-10-CM | POA: Diagnosis not present

## 2014-12-27 DIAGNOSIS — Z9861 Coronary angioplasty status: Secondary | ICD-10-CM | POA: Diagnosis not present

## 2014-12-27 DIAGNOSIS — Z9981 Dependence on supplemental oxygen: Secondary | ICD-10-CM | POA: Diagnosis not present

## 2015-01-01 DIAGNOSIS — M6281 Muscle weakness (generalized): Secondary | ICD-10-CM | POA: Diagnosis not present

## 2015-01-01 DIAGNOSIS — I509 Heart failure, unspecified: Secondary | ICD-10-CM | POA: Diagnosis not present

## 2015-01-01 DIAGNOSIS — J449 Chronic obstructive pulmonary disease, unspecified: Secondary | ICD-10-CM | POA: Diagnosis not present

## 2015-01-01 DIAGNOSIS — G20 Parkinson's disease: Secondary | ICD-10-CM | POA: Diagnosis not present

## 2015-01-01 DIAGNOSIS — I2510 Atherosclerotic heart disease of native coronary artery without angina pectoris: Secondary | ICD-10-CM | POA: Diagnosis not present

## 2015-01-01 DIAGNOSIS — Z9981 Dependence on supplemental oxygen: Secondary | ICD-10-CM | POA: Diagnosis not present

## 2015-01-05 DIAGNOSIS — R040 Epistaxis: Secondary | ICD-10-CM | POA: Diagnosis not present

## 2015-01-06 DIAGNOSIS — Z9981 Dependence on supplemental oxygen: Secondary | ICD-10-CM | POA: Diagnosis not present

## 2015-01-06 DIAGNOSIS — I2510 Atherosclerotic heart disease of native coronary artery without angina pectoris: Secondary | ICD-10-CM | POA: Diagnosis not present

## 2015-01-06 DIAGNOSIS — G20 Parkinson's disease: Secondary | ICD-10-CM | POA: Diagnosis not present

## 2015-01-06 DIAGNOSIS — M6281 Muscle weakness (generalized): Secondary | ICD-10-CM | POA: Diagnosis not present

## 2015-01-06 DIAGNOSIS — I509 Heart failure, unspecified: Secondary | ICD-10-CM | POA: Diagnosis not present

## 2015-01-06 DIAGNOSIS — J449 Chronic obstructive pulmonary disease, unspecified: Secondary | ICD-10-CM | POA: Diagnosis not present

## 2015-01-10 DIAGNOSIS — N400 Enlarged prostate without lower urinary tract symptoms: Secondary | ICD-10-CM | POA: Diagnosis not present

## 2015-01-10 DIAGNOSIS — Z9981 Dependence on supplemental oxygen: Secondary | ICD-10-CM | POA: Diagnosis not present

## 2015-01-10 DIAGNOSIS — G20 Parkinson's disease: Secondary | ICD-10-CM | POA: Diagnosis not present

## 2015-01-10 DIAGNOSIS — J449 Chronic obstructive pulmonary disease, unspecified: Secondary | ICD-10-CM | POA: Diagnosis not present

## 2015-01-10 DIAGNOSIS — E039 Hypothyroidism, unspecified: Secondary | ICD-10-CM | POA: Diagnosis not present

## 2015-01-10 DIAGNOSIS — E785 Hyperlipidemia, unspecified: Secondary | ICD-10-CM | POA: Diagnosis not present

## 2015-01-10 DIAGNOSIS — I5032 Chronic diastolic (congestive) heart failure: Secondary | ICD-10-CM | POA: Diagnosis not present

## 2015-01-10 DIAGNOSIS — M79641 Pain in right hand: Secondary | ICD-10-CM | POA: Diagnosis not present

## 2015-01-11 DIAGNOSIS — M79641 Pain in right hand: Secondary | ICD-10-CM | POA: Diagnosis not present

## 2015-01-11 DIAGNOSIS — G5603 Carpal tunnel syndrome, bilateral upper limbs: Secondary | ICD-10-CM | POA: Diagnosis not present

## 2015-01-11 DIAGNOSIS — G5601 Carpal tunnel syndrome, right upper limb: Secondary | ICD-10-CM | POA: Diagnosis not present

## 2015-01-12 DIAGNOSIS — I7389 Other specified peripheral vascular diseases: Secondary | ICD-10-CM | POA: Diagnosis not present

## 2015-01-12 DIAGNOSIS — M2011 Hallux valgus (acquired), right foot: Secondary | ICD-10-CM | POA: Diagnosis not present

## 2015-01-14 DIAGNOSIS — G20 Parkinson's disease: Secondary | ICD-10-CM | POA: Diagnosis not present

## 2015-01-14 DIAGNOSIS — J449 Chronic obstructive pulmonary disease, unspecified: Secondary | ICD-10-CM | POA: Diagnosis not present

## 2015-01-14 DIAGNOSIS — E785 Hyperlipidemia, unspecified: Secondary | ICD-10-CM | POA: Diagnosis not present

## 2015-01-14 DIAGNOSIS — F411 Generalized anxiety disorder: Secondary | ICD-10-CM | POA: Diagnosis not present

## 2015-01-14 DIAGNOSIS — F331 Major depressive disorder, recurrent, moderate: Secondary | ICD-10-CM | POA: Diagnosis not present

## 2015-01-14 DIAGNOSIS — I5032 Chronic diastolic (congestive) heart failure: Secondary | ICD-10-CM | POA: Diagnosis not present

## 2015-01-14 DIAGNOSIS — E039 Hypothyroidism, unspecified: Secondary | ICD-10-CM | POA: Diagnosis not present

## 2015-01-14 DIAGNOSIS — F419 Anxiety disorder, unspecified: Secondary | ICD-10-CM | POA: Diagnosis not present

## 2015-01-14 DIAGNOSIS — G4700 Insomnia, unspecified: Secondary | ICD-10-CM | POA: Diagnosis not present

## 2015-01-14 DIAGNOSIS — M79641 Pain in right hand: Secondary | ICD-10-CM | POA: Diagnosis not present

## 2015-01-18 DIAGNOSIS — M5031 Other cervical disc degeneration,  high cervical region: Secondary | ICD-10-CM | POA: Diagnosis not present

## 2015-01-18 DIAGNOSIS — M542 Cervicalgia: Secondary | ICD-10-CM | POA: Diagnosis not present

## 2015-01-18 DIAGNOSIS — M4802 Spinal stenosis, cervical region: Secondary | ICD-10-CM | POA: Diagnosis not present

## 2015-01-19 DIAGNOSIS — R05 Cough: Secondary | ICD-10-CM | POA: Diagnosis not present

## 2015-01-21 DIAGNOSIS — R0602 Shortness of breath: Secondary | ICD-10-CM | POA: Diagnosis not present

## 2015-01-21 DIAGNOSIS — R05 Cough: Secondary | ICD-10-CM | POA: Diagnosis not present

## 2015-01-21 DIAGNOSIS — J9811 Atelectasis: Secondary | ICD-10-CM | POA: Diagnosis not present

## 2015-01-21 DIAGNOSIS — J939 Pneumothorax, unspecified: Secondary | ICD-10-CM | POA: Diagnosis not present

## 2015-01-21 DIAGNOSIS — J90 Pleural effusion, not elsewhere classified: Secondary | ICD-10-CM | POA: Diagnosis not present

## 2015-01-21 DIAGNOSIS — J439 Emphysema, unspecified: Secondary | ICD-10-CM | POA: Diagnosis not present

## 2015-01-21 DIAGNOSIS — R599 Enlarged lymph nodes, unspecified: Secondary | ICD-10-CM | POA: Diagnosis not present

## 2015-01-22 DIAGNOSIS — J449 Chronic obstructive pulmonary disease, unspecified: Secondary | ICD-10-CM | POA: Diagnosis not present

## 2015-01-22 DIAGNOSIS — E785 Hyperlipidemia, unspecified: Secondary | ICD-10-CM | POA: Diagnosis not present

## 2015-01-22 DIAGNOSIS — G20 Parkinson's disease: Secondary | ICD-10-CM | POA: Diagnosis not present

## 2015-01-22 DIAGNOSIS — E039 Hypothyroidism, unspecified: Secondary | ICD-10-CM | POA: Diagnosis not present

## 2015-01-22 DIAGNOSIS — M79641 Pain in right hand: Secondary | ICD-10-CM | POA: Diagnosis not present

## 2015-01-22 DIAGNOSIS — I5032 Chronic diastolic (congestive) heart failure: Secondary | ICD-10-CM | POA: Diagnosis not present

## 2015-01-27 DIAGNOSIS — I5032 Chronic diastolic (congestive) heart failure: Secondary | ICD-10-CM | POA: Diagnosis not present

## 2015-01-27 DIAGNOSIS — M79641 Pain in right hand: Secondary | ICD-10-CM | POA: Diagnosis not present

## 2015-01-27 DIAGNOSIS — G20 Parkinson's disease: Secondary | ICD-10-CM | POA: Diagnosis not present

## 2015-01-27 DIAGNOSIS — J449 Chronic obstructive pulmonary disease, unspecified: Secondary | ICD-10-CM | POA: Diagnosis not present

## 2015-01-27 DIAGNOSIS — E785 Hyperlipidemia, unspecified: Secondary | ICD-10-CM | POA: Diagnosis not present

## 2015-01-27 DIAGNOSIS — E039 Hypothyroidism, unspecified: Secondary | ICD-10-CM | POA: Diagnosis not present

## 2015-01-28 DIAGNOSIS — F331 Major depressive disorder, recurrent, moderate: Secondary | ICD-10-CM | POA: Diagnosis not present

## 2015-01-28 DIAGNOSIS — F411 Generalized anxiety disorder: Secondary | ICD-10-CM | POA: Diagnosis not present

## 2015-01-29 DIAGNOSIS — Z09 Encounter for follow-up examination after completed treatment for conditions other than malignant neoplasm: Secondary | ICD-10-CM | POA: Diagnosis not present

## 2015-01-29 DIAGNOSIS — D689 Coagulation defect, unspecified: Secondary | ICD-10-CM | POA: Diagnosis not present

## 2015-01-29 DIAGNOSIS — Z112 Encounter for screening for other bacterial diseases: Secondary | ICD-10-CM | POA: Diagnosis not present

## 2015-01-29 DIAGNOSIS — Z87891 Personal history of nicotine dependence: Secondary | ICD-10-CM | POA: Diagnosis not present

## 2015-01-29 DIAGNOSIS — R918 Other nonspecific abnormal finding of lung field: Secondary | ICD-10-CM | POA: Diagnosis not present

## 2015-01-29 DIAGNOSIS — N390 Urinary tract infection, site not specified: Secondary | ICD-10-CM | POA: Diagnosis not present

## 2015-01-29 DIAGNOSIS — G5601 Carpal tunnel syndrome, right upper limb: Secondary | ICD-10-CM | POA: Diagnosis not present

## 2015-02-03 DIAGNOSIS — I5032 Chronic diastolic (congestive) heart failure: Secondary | ICD-10-CM | POA: Diagnosis not present

## 2015-02-03 DIAGNOSIS — G20 Parkinson's disease: Secondary | ICD-10-CM | POA: Diagnosis not present

## 2015-02-03 DIAGNOSIS — M79641 Pain in right hand: Secondary | ICD-10-CM | POA: Diagnosis not present

## 2015-02-03 DIAGNOSIS — E039 Hypothyroidism, unspecified: Secondary | ICD-10-CM | POA: Diagnosis not present

## 2015-02-03 DIAGNOSIS — E785 Hyperlipidemia, unspecified: Secondary | ICD-10-CM | POA: Diagnosis not present

## 2015-02-03 DIAGNOSIS — J449 Chronic obstructive pulmonary disease, unspecified: Secondary | ICD-10-CM | POA: Diagnosis not present

## 2015-02-11 DIAGNOSIS — F331 Major depressive disorder, recurrent, moderate: Secondary | ICD-10-CM | POA: Diagnosis not present

## 2015-02-11 DIAGNOSIS — F411 Generalized anxiety disorder: Secondary | ICD-10-CM | POA: Diagnosis not present

## 2015-02-13 DIAGNOSIS — G20 Parkinson's disease: Secondary | ICD-10-CM | POA: Diagnosis not present

## 2015-02-13 DIAGNOSIS — J449 Chronic obstructive pulmonary disease, unspecified: Secondary | ICD-10-CM | POA: Diagnosis not present

## 2015-02-13 DIAGNOSIS — M79641 Pain in right hand: Secondary | ICD-10-CM | POA: Diagnosis not present

## 2015-02-13 DIAGNOSIS — I5032 Chronic diastolic (congestive) heart failure: Secondary | ICD-10-CM | POA: Diagnosis not present

## 2015-02-13 DIAGNOSIS — E785 Hyperlipidemia, unspecified: Secondary | ICD-10-CM | POA: Diagnosis not present

## 2015-02-13 DIAGNOSIS — E039 Hypothyroidism, unspecified: Secondary | ICD-10-CM | POA: Diagnosis not present

## 2015-02-18 DIAGNOSIS — G5601 Carpal tunnel syndrome, right upper limb: Secondary | ICD-10-CM | POA: Diagnosis not present

## 2015-02-18 DIAGNOSIS — J449 Chronic obstructive pulmonary disease, unspecified: Secondary | ICD-10-CM | POA: Diagnosis not present

## 2015-02-18 DIAGNOSIS — G20 Parkinson's disease: Secondary | ICD-10-CM | POA: Diagnosis not present

## 2015-02-20 DIAGNOSIS — E039 Hypothyroidism, unspecified: Secondary | ICD-10-CM | POA: Diagnosis not present

## 2015-02-20 DIAGNOSIS — E785 Hyperlipidemia, unspecified: Secondary | ICD-10-CM | POA: Diagnosis not present

## 2015-02-20 DIAGNOSIS — G20 Parkinson's disease: Secondary | ICD-10-CM | POA: Diagnosis not present

## 2015-02-20 DIAGNOSIS — M79641 Pain in right hand: Secondary | ICD-10-CM | POA: Diagnosis not present

## 2015-02-20 DIAGNOSIS — J449 Chronic obstructive pulmonary disease, unspecified: Secondary | ICD-10-CM | POA: Diagnosis not present

## 2015-02-20 DIAGNOSIS — I5032 Chronic diastolic (congestive) heart failure: Secondary | ICD-10-CM | POA: Diagnosis not present

## 2015-02-23 DIAGNOSIS — I10 Essential (primary) hypertension: Secondary | ICD-10-CM | POA: Diagnosis not present

## 2015-02-23 DIAGNOSIS — J449 Chronic obstructive pulmonary disease, unspecified: Secondary | ICD-10-CM | POA: Diagnosis not present

## 2015-02-23 DIAGNOSIS — G5601 Carpal tunnel syndrome, right upper limb: Secondary | ICD-10-CM | POA: Diagnosis not present

## 2015-02-23 DIAGNOSIS — E039 Hypothyroidism, unspecified: Secondary | ICD-10-CM | POA: Diagnosis not present

## 2015-02-23 DIAGNOSIS — Z9981 Dependence on supplemental oxygen: Secondary | ICD-10-CM | POA: Diagnosis not present

## 2015-02-23 DIAGNOSIS — E7800 Pure hypercholesterolemia, unspecified: Secondary | ICD-10-CM | POA: Diagnosis not present

## 2015-02-23 DIAGNOSIS — Z79899 Other long term (current) drug therapy: Secondary | ICD-10-CM | POA: Diagnosis not present

## 2015-02-25 DIAGNOSIS — F331 Major depressive disorder, recurrent, moderate: Secondary | ICD-10-CM | POA: Diagnosis not present

## 2015-02-25 DIAGNOSIS — I5032 Chronic diastolic (congestive) heart failure: Secondary | ICD-10-CM | POA: Diagnosis not present

## 2015-02-25 DIAGNOSIS — G20 Parkinson's disease: Secondary | ICD-10-CM | POA: Diagnosis not present

## 2015-02-25 DIAGNOSIS — J449 Chronic obstructive pulmonary disease, unspecified: Secondary | ICD-10-CM | POA: Diagnosis not present

## 2015-02-25 DIAGNOSIS — M79641 Pain in right hand: Secondary | ICD-10-CM | POA: Diagnosis not present

## 2015-02-25 DIAGNOSIS — E785 Hyperlipidemia, unspecified: Secondary | ICD-10-CM | POA: Diagnosis not present

## 2015-02-25 DIAGNOSIS — F411 Generalized anxiety disorder: Secondary | ICD-10-CM | POA: Diagnosis not present

## 2015-02-25 DIAGNOSIS — E039 Hypothyroidism, unspecified: Secondary | ICD-10-CM | POA: Diagnosis not present

## 2015-03-03 DIAGNOSIS — E039 Hypothyroidism, unspecified: Secondary | ICD-10-CM | POA: Diagnosis not present

## 2015-03-03 DIAGNOSIS — E785 Hyperlipidemia, unspecified: Secondary | ICD-10-CM | POA: Diagnosis not present

## 2015-03-03 DIAGNOSIS — J449 Chronic obstructive pulmonary disease, unspecified: Secondary | ICD-10-CM | POA: Diagnosis not present

## 2015-03-03 DIAGNOSIS — M79641 Pain in right hand: Secondary | ICD-10-CM | POA: Diagnosis not present

## 2015-03-03 DIAGNOSIS — G20 Parkinson's disease: Secondary | ICD-10-CM | POA: Diagnosis not present

## 2015-03-03 DIAGNOSIS — I5032 Chronic diastolic (congestive) heart failure: Secondary | ICD-10-CM | POA: Diagnosis not present

## 2015-03-09 DIAGNOSIS — Z4789 Encounter for other orthopedic aftercare: Secondary | ICD-10-CM | POA: Diagnosis not present

## 2015-03-09 DIAGNOSIS — M79641 Pain in right hand: Secondary | ICD-10-CM | POA: Diagnosis not present

## 2015-03-09 DIAGNOSIS — M25641 Stiffness of right hand, not elsewhere classified: Secondary | ICD-10-CM | POA: Diagnosis not present

## 2015-03-09 DIAGNOSIS — G5601 Carpal tunnel syndrome, right upper limb: Secondary | ICD-10-CM | POA: Diagnosis not present

## 2015-03-10 DIAGNOSIS — G20 Parkinson's disease: Secondary | ICD-10-CM | POA: Diagnosis not present

## 2015-03-10 DIAGNOSIS — I5032 Chronic diastolic (congestive) heart failure: Secondary | ICD-10-CM | POA: Diagnosis not present

## 2015-03-10 DIAGNOSIS — E785 Hyperlipidemia, unspecified: Secondary | ICD-10-CM | POA: Diagnosis not present

## 2015-03-10 DIAGNOSIS — M79641 Pain in right hand: Secondary | ICD-10-CM | POA: Diagnosis not present

## 2015-03-10 DIAGNOSIS — J449 Chronic obstructive pulmonary disease, unspecified: Secondary | ICD-10-CM | POA: Diagnosis not present

## 2015-03-10 DIAGNOSIS — E039 Hypothyroidism, unspecified: Secondary | ICD-10-CM | POA: Diagnosis not present

## 2015-03-10 DIAGNOSIS — I7389 Other specified peripheral vascular diseases: Secondary | ICD-10-CM | POA: Diagnosis not present

## 2015-03-10 DIAGNOSIS — M2011 Hallux valgus (acquired), right foot: Secondary | ICD-10-CM | POA: Diagnosis not present

## 2015-03-11 DIAGNOSIS — F411 Generalized anxiety disorder: Secondary | ICD-10-CM | POA: Diagnosis not present

## 2015-03-11 DIAGNOSIS — N400 Enlarged prostate without lower urinary tract symptoms: Secondary | ICD-10-CM | POA: Diagnosis not present

## 2015-03-11 DIAGNOSIS — Z9981 Dependence on supplemental oxygen: Secondary | ICD-10-CM | POA: Diagnosis not present

## 2015-03-11 DIAGNOSIS — Z4789 Encounter for other orthopedic aftercare: Secondary | ICD-10-CM | POA: Diagnosis not present

## 2015-03-11 DIAGNOSIS — I25810 Atherosclerosis of coronary artery bypass graft(s) without angina pectoris: Secondary | ICD-10-CM | POA: Diagnosis not present

## 2015-03-11 DIAGNOSIS — I5032 Chronic diastolic (congestive) heart failure: Secondary | ICD-10-CM | POA: Diagnosis not present

## 2015-03-11 DIAGNOSIS — G5601 Carpal tunnel syndrome, right upper limb: Secondary | ICD-10-CM | POA: Diagnosis not present

## 2015-03-11 DIAGNOSIS — E785 Hyperlipidemia, unspecified: Secondary | ICD-10-CM | POA: Diagnosis not present

## 2015-03-11 DIAGNOSIS — I2510 Atherosclerotic heart disease of native coronary artery without angina pectoris: Secondary | ICD-10-CM | POA: Diagnosis not present

## 2015-03-11 DIAGNOSIS — E782 Mixed hyperlipidemia: Secondary | ICD-10-CM | POA: Diagnosis not present

## 2015-03-11 DIAGNOSIS — G20 Parkinson's disease: Secondary | ICD-10-CM | POA: Diagnosis not present

## 2015-03-11 DIAGNOSIS — J449 Chronic obstructive pulmonary disease, unspecified: Secondary | ICD-10-CM | POA: Diagnosis not present

## 2015-03-11 DIAGNOSIS — G5600 Carpal tunnel syndrome, unspecified upper limb: Secondary | ICD-10-CM | POA: Diagnosis not present

## 2015-03-11 DIAGNOSIS — M79641 Pain in right hand: Secondary | ICD-10-CM | POA: Diagnosis not present

## 2015-03-11 DIAGNOSIS — M25641 Stiffness of right hand, not elsewhere classified: Secondary | ICD-10-CM | POA: Diagnosis not present

## 2015-03-11 DIAGNOSIS — E039 Hypothyroidism, unspecified: Secondary | ICD-10-CM | POA: Diagnosis not present

## 2015-03-11 DIAGNOSIS — F331 Major depressive disorder, recurrent, moderate: Secondary | ICD-10-CM | POA: Diagnosis not present

## 2015-03-11 DIAGNOSIS — I10 Essential (primary) hypertension: Secondary | ICD-10-CM | POA: Diagnosis not present

## 2015-03-16 DIAGNOSIS — G5601 Carpal tunnel syndrome, right upper limb: Secondary | ICD-10-CM | POA: Diagnosis not present

## 2015-03-16 DIAGNOSIS — M79641 Pain in right hand: Secondary | ICD-10-CM | POA: Diagnosis not present

## 2015-03-16 DIAGNOSIS — M25641 Stiffness of right hand, not elsewhere classified: Secondary | ICD-10-CM | POA: Diagnosis not present

## 2015-03-16 DIAGNOSIS — Z4789 Encounter for other orthopedic aftercare: Secondary | ICD-10-CM | POA: Diagnosis not present

## 2015-03-18 DIAGNOSIS — M25641 Stiffness of right hand, not elsewhere classified: Secondary | ICD-10-CM | POA: Diagnosis not present

## 2015-03-18 DIAGNOSIS — F411 Generalized anxiety disorder: Secondary | ICD-10-CM | POA: Diagnosis not present

## 2015-03-18 DIAGNOSIS — M79641 Pain in right hand: Secondary | ICD-10-CM | POA: Diagnosis not present

## 2015-03-18 DIAGNOSIS — G20 Parkinson's disease: Secondary | ICD-10-CM | POA: Diagnosis not present

## 2015-03-18 DIAGNOSIS — I5032 Chronic diastolic (congestive) heart failure: Secondary | ICD-10-CM | POA: Diagnosis not present

## 2015-03-18 DIAGNOSIS — Z4789 Encounter for other orthopedic aftercare: Secondary | ICD-10-CM | POA: Diagnosis not present

## 2015-03-18 DIAGNOSIS — Z9981 Dependence on supplemental oxygen: Secondary | ICD-10-CM | POA: Diagnosis not present

## 2015-03-18 DIAGNOSIS — J449 Chronic obstructive pulmonary disease, unspecified: Secondary | ICD-10-CM | POA: Diagnosis not present

## 2015-03-18 DIAGNOSIS — G5601 Carpal tunnel syndrome, right upper limb: Secondary | ICD-10-CM | POA: Diagnosis not present

## 2015-03-18 DIAGNOSIS — G5600 Carpal tunnel syndrome, unspecified upper limb: Secondary | ICD-10-CM | POA: Diagnosis not present

## 2015-03-18 DIAGNOSIS — F331 Major depressive disorder, recurrent, moderate: Secondary | ICD-10-CM | POA: Diagnosis not present

## 2015-03-21 DIAGNOSIS — F411 Generalized anxiety disorder: Secondary | ICD-10-CM | POA: Diagnosis not present

## 2015-03-21 DIAGNOSIS — F331 Major depressive disorder, recurrent, moderate: Secondary | ICD-10-CM | POA: Diagnosis not present

## 2015-03-23 DIAGNOSIS — Z4789 Encounter for other orthopedic aftercare: Secondary | ICD-10-CM | POA: Diagnosis not present

## 2015-03-23 DIAGNOSIS — M25641 Stiffness of right hand, not elsewhere classified: Secondary | ICD-10-CM | POA: Diagnosis not present

## 2015-03-23 DIAGNOSIS — G5601 Carpal tunnel syndrome, right upper limb: Secondary | ICD-10-CM | POA: Diagnosis not present

## 2015-03-23 DIAGNOSIS — M79641 Pain in right hand: Secondary | ICD-10-CM | POA: Diagnosis not present

## 2015-03-25 DIAGNOSIS — G20 Parkinson's disease: Secondary | ICD-10-CM | POA: Diagnosis not present

## 2015-03-25 DIAGNOSIS — F411 Generalized anxiety disorder: Secondary | ICD-10-CM | POA: Diagnosis not present

## 2015-03-25 DIAGNOSIS — G5601 Carpal tunnel syndrome, right upper limb: Secondary | ICD-10-CM | POA: Diagnosis not present

## 2015-03-25 DIAGNOSIS — G5600 Carpal tunnel syndrome, unspecified upper limb: Secondary | ICD-10-CM | POA: Diagnosis not present

## 2015-03-25 DIAGNOSIS — M79641 Pain in right hand: Secondary | ICD-10-CM | POA: Diagnosis not present

## 2015-03-25 DIAGNOSIS — Z9981 Dependence on supplemental oxygen: Secondary | ICD-10-CM | POA: Diagnosis not present

## 2015-03-25 DIAGNOSIS — M25641 Stiffness of right hand, not elsewhere classified: Secondary | ICD-10-CM | POA: Diagnosis not present

## 2015-03-25 DIAGNOSIS — Z4789 Encounter for other orthopedic aftercare: Secondary | ICD-10-CM | POA: Diagnosis not present

## 2015-03-25 DIAGNOSIS — F331 Major depressive disorder, recurrent, moderate: Secondary | ICD-10-CM | POA: Diagnosis not present

## 2015-03-25 DIAGNOSIS — J449 Chronic obstructive pulmonary disease, unspecified: Secondary | ICD-10-CM | POA: Diagnosis not present

## 2015-03-25 DIAGNOSIS — I5032 Chronic diastolic (congestive) heart failure: Secondary | ICD-10-CM | POA: Diagnosis not present

## 2015-03-30 DIAGNOSIS — Z4789 Encounter for other orthopedic aftercare: Secondary | ICD-10-CM | POA: Diagnosis not present

## 2015-03-30 DIAGNOSIS — M25641 Stiffness of right hand, not elsewhere classified: Secondary | ICD-10-CM | POA: Diagnosis not present

## 2015-03-30 DIAGNOSIS — G5601 Carpal tunnel syndrome, right upper limb: Secondary | ICD-10-CM | POA: Diagnosis not present

## 2015-03-30 DIAGNOSIS — M79641 Pain in right hand: Secondary | ICD-10-CM | POA: Diagnosis not present

## 2015-04-01 DIAGNOSIS — J849 Interstitial pulmonary disease, unspecified: Secondary | ICD-10-CM | POA: Diagnosis not present

## 2015-04-01 DIAGNOSIS — F331 Major depressive disorder, recurrent, moderate: Secondary | ICD-10-CM | POA: Diagnosis not present

## 2015-04-01 DIAGNOSIS — R0902 Hypoxemia: Secondary | ICD-10-CM | POA: Diagnosis not present

## 2015-04-01 DIAGNOSIS — G5601 Carpal tunnel syndrome, right upper limb: Secondary | ICD-10-CM | POA: Diagnosis not present

## 2015-04-01 DIAGNOSIS — F411 Generalized anxiety disorder: Secondary | ICD-10-CM | POA: Diagnosis not present

## 2015-04-01 DIAGNOSIS — M25641 Stiffness of right hand, not elsewhere classified: Secondary | ICD-10-CM | POA: Diagnosis not present

## 2015-04-01 DIAGNOSIS — Z4789 Encounter for other orthopedic aftercare: Secondary | ICD-10-CM | POA: Diagnosis not present

## 2015-04-01 DIAGNOSIS — R0600 Dyspnea, unspecified: Secondary | ICD-10-CM | POA: Diagnosis not present

## 2015-04-01 DIAGNOSIS — M79641 Pain in right hand: Secondary | ICD-10-CM | POA: Diagnosis not present

## 2015-04-01 DIAGNOSIS — R05 Cough: Secondary | ICD-10-CM | POA: Diagnosis not present

## 2015-04-02 DIAGNOSIS — Z4789 Encounter for other orthopedic aftercare: Secondary | ICD-10-CM | POA: Diagnosis not present

## 2015-04-02 DIAGNOSIS — J449 Chronic obstructive pulmonary disease, unspecified: Secondary | ICD-10-CM | POA: Diagnosis not present

## 2015-04-02 DIAGNOSIS — Z9981 Dependence on supplemental oxygen: Secondary | ICD-10-CM | POA: Diagnosis not present

## 2015-04-02 DIAGNOSIS — G5600 Carpal tunnel syndrome, unspecified upper limb: Secondary | ICD-10-CM | POA: Diagnosis not present

## 2015-04-02 DIAGNOSIS — I5032 Chronic diastolic (congestive) heart failure: Secondary | ICD-10-CM | POA: Diagnosis not present

## 2015-04-02 DIAGNOSIS — G20 Parkinson's disease: Secondary | ICD-10-CM | POA: Diagnosis not present

## 2015-04-05 DIAGNOSIS — Z09 Encounter for follow-up examination after completed treatment for conditions other than malignant neoplasm: Secondary | ICD-10-CM | POA: Diagnosis not present

## 2015-04-05 DIAGNOSIS — G5601 Carpal tunnel syndrome, right upper limb: Secondary | ICD-10-CM | POA: Diagnosis not present

## 2015-04-06 DIAGNOSIS — M25641 Stiffness of right hand, not elsewhere classified: Secondary | ICD-10-CM | POA: Diagnosis not present

## 2015-04-06 DIAGNOSIS — Z4789 Encounter for other orthopedic aftercare: Secondary | ICD-10-CM | POA: Diagnosis not present

## 2015-04-06 DIAGNOSIS — G5601 Carpal tunnel syndrome, right upper limb: Secondary | ICD-10-CM | POA: Diagnosis not present

## 2015-04-06 DIAGNOSIS — M79641 Pain in right hand: Secondary | ICD-10-CM | POA: Diagnosis not present

## 2015-04-08 DIAGNOSIS — G5600 Carpal tunnel syndrome, unspecified upper limb: Secondary | ICD-10-CM | POA: Diagnosis not present

## 2015-04-08 DIAGNOSIS — Z9981 Dependence on supplemental oxygen: Secondary | ICD-10-CM | POA: Diagnosis not present

## 2015-04-08 DIAGNOSIS — J449 Chronic obstructive pulmonary disease, unspecified: Secondary | ICD-10-CM | POA: Diagnosis not present

## 2015-04-08 DIAGNOSIS — M25641 Stiffness of right hand, not elsewhere classified: Secondary | ICD-10-CM | POA: Diagnosis not present

## 2015-04-08 DIAGNOSIS — G5601 Carpal tunnel syndrome, right upper limb: Secondary | ICD-10-CM | POA: Diagnosis not present

## 2015-04-08 DIAGNOSIS — I5032 Chronic diastolic (congestive) heart failure: Secondary | ICD-10-CM | POA: Diagnosis not present

## 2015-04-08 DIAGNOSIS — Z4789 Encounter for other orthopedic aftercare: Secondary | ICD-10-CM | POA: Diagnosis not present

## 2015-04-08 DIAGNOSIS — G20 Parkinson's disease: Secondary | ICD-10-CM | POA: Diagnosis not present

## 2015-04-08 DIAGNOSIS — M79641 Pain in right hand: Secondary | ICD-10-CM | POA: Diagnosis not present

## 2015-04-10 DIAGNOSIS — F331 Major depressive disorder, recurrent, moderate: Secondary | ICD-10-CM | POA: Diagnosis not present

## 2015-04-10 DIAGNOSIS — F411 Generalized anxiety disorder: Secondary | ICD-10-CM | POA: Diagnosis not present

## 2015-04-13 DIAGNOSIS — G5601 Carpal tunnel syndrome, right upper limb: Secondary | ICD-10-CM | POA: Diagnosis not present

## 2015-04-13 DIAGNOSIS — Z4789 Encounter for other orthopedic aftercare: Secondary | ICD-10-CM | POA: Diagnosis not present

## 2015-04-13 DIAGNOSIS — J309 Allergic rhinitis, unspecified: Secondary | ICD-10-CM | POA: Diagnosis not present

## 2015-04-13 DIAGNOSIS — J320 Chronic maxillary sinusitis: Secondary | ICD-10-CM | POA: Diagnosis not present

## 2015-04-13 DIAGNOSIS — M25641 Stiffness of right hand, not elsewhere classified: Secondary | ICD-10-CM | POA: Diagnosis not present

## 2015-04-13 DIAGNOSIS — J322 Chronic ethmoidal sinusitis: Secondary | ICD-10-CM | POA: Diagnosis not present

## 2015-04-13 DIAGNOSIS — M79641 Pain in right hand: Secondary | ICD-10-CM | POA: Diagnosis not present

## 2015-04-15 DIAGNOSIS — G5600 Carpal tunnel syndrome, unspecified upper limb: Secondary | ICD-10-CM | POA: Diagnosis not present

## 2015-04-15 DIAGNOSIS — I2510 Atherosclerotic heart disease of native coronary artery without angina pectoris: Secondary | ICD-10-CM | POA: Diagnosis not present

## 2015-04-15 DIAGNOSIS — J449 Chronic obstructive pulmonary disease, unspecified: Secondary | ICD-10-CM | POA: Diagnosis not present

## 2015-04-15 DIAGNOSIS — G20 Parkinson's disease: Secondary | ICD-10-CM | POA: Diagnosis not present

## 2015-04-15 DIAGNOSIS — F419 Anxiety disorder, unspecified: Secondary | ICD-10-CM | POA: Diagnosis not present

## 2015-04-15 DIAGNOSIS — M25641 Stiffness of right hand, not elsewhere classified: Secondary | ICD-10-CM | POA: Diagnosis not present

## 2015-04-15 DIAGNOSIS — F411 Generalized anxiety disorder: Secondary | ICD-10-CM | POA: Diagnosis not present

## 2015-04-15 DIAGNOSIS — G4700 Insomnia, unspecified: Secondary | ICD-10-CM | POA: Diagnosis not present

## 2015-04-15 DIAGNOSIS — M79641 Pain in right hand: Secondary | ICD-10-CM | POA: Diagnosis not present

## 2015-04-15 DIAGNOSIS — G5601 Carpal tunnel syndrome, right upper limb: Secondary | ICD-10-CM | POA: Diagnosis not present

## 2015-04-15 DIAGNOSIS — F331 Major depressive disorder, recurrent, moderate: Secondary | ICD-10-CM | POA: Diagnosis not present

## 2015-04-15 DIAGNOSIS — Z4789 Encounter for other orthopedic aftercare: Secondary | ICD-10-CM | POA: Diagnosis not present

## 2015-04-15 DIAGNOSIS — Z9981 Dependence on supplemental oxygen: Secondary | ICD-10-CM | POA: Diagnosis not present

## 2015-04-15 DIAGNOSIS — I5032 Chronic diastolic (congestive) heart failure: Secondary | ICD-10-CM | POA: Diagnosis not present

## 2015-04-20 DIAGNOSIS — M25641 Stiffness of right hand, not elsewhere classified: Secondary | ICD-10-CM | POA: Diagnosis not present

## 2015-04-20 DIAGNOSIS — G5601 Carpal tunnel syndrome, right upper limb: Secondary | ICD-10-CM | POA: Diagnosis not present

## 2015-04-20 DIAGNOSIS — Z4789 Encounter for other orthopedic aftercare: Secondary | ICD-10-CM | POA: Diagnosis not present

## 2015-04-20 DIAGNOSIS — M79641 Pain in right hand: Secondary | ICD-10-CM | POA: Diagnosis not present

## 2015-04-22 DIAGNOSIS — F411 Generalized anxiety disorder: Secondary | ICD-10-CM | POA: Diagnosis not present

## 2015-04-22 DIAGNOSIS — F331 Major depressive disorder, recurrent, moderate: Secondary | ICD-10-CM | POA: Diagnosis not present

## 2015-04-24 DIAGNOSIS — J449 Chronic obstructive pulmonary disease, unspecified: Secondary | ICD-10-CM | POA: Diagnosis not present

## 2015-04-24 DIAGNOSIS — G5600 Carpal tunnel syndrome, unspecified upper limb: Secondary | ICD-10-CM | POA: Diagnosis not present

## 2015-04-24 DIAGNOSIS — I5032 Chronic diastolic (congestive) heart failure: Secondary | ICD-10-CM | POA: Diagnosis not present

## 2015-04-24 DIAGNOSIS — G20 Parkinson's disease: Secondary | ICD-10-CM | POA: Diagnosis not present

## 2015-04-24 DIAGNOSIS — Z9981 Dependence on supplemental oxygen: Secondary | ICD-10-CM | POA: Diagnosis not present

## 2015-04-24 DIAGNOSIS — Z4789 Encounter for other orthopedic aftercare: Secondary | ICD-10-CM | POA: Diagnosis not present

## 2015-04-27 DIAGNOSIS — G5600 Carpal tunnel syndrome, unspecified upper limb: Secondary | ICD-10-CM | POA: Diagnosis not present

## 2015-04-27 DIAGNOSIS — J449 Chronic obstructive pulmonary disease, unspecified: Secondary | ICD-10-CM | POA: Diagnosis not present

## 2015-04-27 DIAGNOSIS — Z9981 Dependence on supplemental oxygen: Secondary | ICD-10-CM | POA: Diagnosis not present

## 2015-04-27 DIAGNOSIS — G5601 Carpal tunnel syndrome, right upper limb: Secondary | ICD-10-CM | POA: Diagnosis not present

## 2015-04-27 DIAGNOSIS — Z4789 Encounter for other orthopedic aftercare: Secondary | ICD-10-CM | POA: Diagnosis not present

## 2015-04-27 DIAGNOSIS — I5032 Chronic diastolic (congestive) heart failure: Secondary | ICD-10-CM | POA: Diagnosis not present

## 2015-04-27 DIAGNOSIS — G20 Parkinson's disease: Secondary | ICD-10-CM | POA: Diagnosis not present

## 2015-04-27 DIAGNOSIS — M79641 Pain in right hand: Secondary | ICD-10-CM | POA: Diagnosis not present

## 2015-04-27 DIAGNOSIS — M25641 Stiffness of right hand, not elsewhere classified: Secondary | ICD-10-CM | POA: Diagnosis not present

## 2015-04-29 DIAGNOSIS — M25641 Stiffness of right hand, not elsewhere classified: Secondary | ICD-10-CM | POA: Diagnosis not present

## 2015-04-29 DIAGNOSIS — M79641 Pain in right hand: Secondary | ICD-10-CM | POA: Diagnosis not present

## 2015-04-29 DIAGNOSIS — Z4789 Encounter for other orthopedic aftercare: Secondary | ICD-10-CM | POA: Diagnosis not present

## 2015-04-29 DIAGNOSIS — G5601 Carpal tunnel syndrome, right upper limb: Secondary | ICD-10-CM | POA: Diagnosis not present

## 2015-05-04 DIAGNOSIS — G20 Parkinson's disease: Secondary | ICD-10-CM | POA: Diagnosis not present

## 2015-05-04 DIAGNOSIS — Z9981 Dependence on supplemental oxygen: Secondary | ICD-10-CM | POA: Diagnosis not present

## 2015-05-04 DIAGNOSIS — J449 Chronic obstructive pulmonary disease, unspecified: Secondary | ICD-10-CM | POA: Diagnosis not present

## 2015-05-04 DIAGNOSIS — G5600 Carpal tunnel syndrome, unspecified upper limb: Secondary | ICD-10-CM | POA: Diagnosis not present

## 2015-05-04 DIAGNOSIS — Z4789 Encounter for other orthopedic aftercare: Secondary | ICD-10-CM | POA: Diagnosis not present

## 2015-05-04 DIAGNOSIS — I5032 Chronic diastolic (congestive) heart failure: Secondary | ICD-10-CM | POA: Diagnosis not present

## 2015-05-06 DIAGNOSIS — F411 Generalized anxiety disorder: Secondary | ICD-10-CM | POA: Diagnosis not present

## 2015-05-06 DIAGNOSIS — F331 Major depressive disorder, recurrent, moderate: Secondary | ICD-10-CM | POA: Diagnosis not present

## 2015-05-10 DIAGNOSIS — R209 Unspecified disturbances of skin sensation: Secondary | ICD-10-CM | POA: Diagnosis not present

## 2015-05-10 DIAGNOSIS — Z9889 Other specified postprocedural states: Secondary | ICD-10-CM | POA: Diagnosis not present

## 2015-05-10 DIAGNOSIS — G5601 Carpal tunnel syndrome, right upper limb: Secondary | ICD-10-CM | POA: Diagnosis not present

## 2015-05-15 DIAGNOSIS — F331 Major depressive disorder, recurrent, moderate: Secondary | ICD-10-CM | POA: Diagnosis not present

## 2015-05-15 DIAGNOSIS — F411 Generalized anxiety disorder: Secondary | ICD-10-CM | POA: Diagnosis not present

## 2015-05-26 ENCOUNTER — Institutional Professional Consult (permissible substitution): Payer: Self-pay | Admitting: Pulmonary Disease

## 2015-05-26 ENCOUNTER — Encounter: Payer: Self-pay | Admitting: Pulmonary Disease

## 2015-05-26 ENCOUNTER — Ambulatory Visit (INDEPENDENT_AMBULATORY_CARE_PROVIDER_SITE_OTHER): Payer: Medicare Other | Admitting: Pulmonary Disease

## 2015-05-26 VITALS — BP 130/72 | HR 91 | Ht 70.5 in | Wt 178.8 lb

## 2015-05-26 DIAGNOSIS — I2510 Atherosclerotic heart disease of native coronary artery without angina pectoris: Secondary | ICD-10-CM | POA: Diagnosis not present

## 2015-05-26 DIAGNOSIS — J8410 Pulmonary fibrosis, unspecified: Secondary | ICD-10-CM

## 2015-05-26 DIAGNOSIS — J449 Chronic obstructive pulmonary disease, unspecified: Secondary | ICD-10-CM | POA: Diagnosis not present

## 2015-05-26 DIAGNOSIS — R0902 Hypoxemia: Secondary | ICD-10-CM

## 2015-05-27 ENCOUNTER — Encounter: Payer: Self-pay | Admitting: Pulmonary Disease

## 2015-05-27 NOTE — Progress Notes (Signed)
PULMONARY CONSULT NOTE  Requesting MD/Service: Self referral Date of initial consultation: 05/26/15 Reason for consultation: COPD, ILD, Chronic hypoxemia  PT PROFILE: 82 y.o. M self referred, former smoker, previously followed by pulmonologist in [place], chronically O2 dependent, history of COPD, post inflammatory pulmonary fibrosis, Parkinson's disease, chronic aspiration  HPI:  82 yo M with above history moved recently from [place] and here to establish pulmonary care. He has COPD, ILD, chronic aspiration diagnosed by [facility] in 2016 (presumably related to Parkinson's). He has been hospitalized twice for PNA in the past 2 years. He has also recently undergone inguinal and umbilical hernia repairs in Sept 2016. He has a prior history of CAD and has undergone CABG and stent placement. At his baseline, he is moderately to severely limited by dyspnea and fatigue as well as by Parkinson's. He is presently @ his baseline. Denies CP, fever, purulent sputum, hemoptysis, LE edema and calf tenderness. He wears O2 chronically @ 3-4 lpm by [place]   Past Medical History  Diagnosis Date  . Parkinson disease ([place])   . ILD (interstitial lung disease) ([place])   . Heart attack ([place])   . Hearing difficulty of both ears   Hypothyroidism Pneumonias - 2015, 2016  Past Surgical History  Procedure Laterality Date  . Vein bypass surgery    . Stents in heart     CABG. Umbilical hernia repair. Inguinal hernia repair  MEDICATIONS: I have reviewed all medications and confirmed regimen as documented  Social History   Social History  . Marital Status: Divorced    Spouse Name: N/A  . Number of Children: N/A  . Years of Education: N/A   Occupational History  . Not on file.   Social History Main Topics  . Smoking status: Former Smoker -- 2.00 packs/day for 35 years  . Smokeless tobacco: Former Systems developer    Quit date: 05/13/1990  . Alcohol Use: Yes     Comment: occ  . Drug Use: No  . Sexual Activity: Not on file    Other Topics Concern  . Not on file   Social History Narrative    Family History  Problem Relation Age of Onset  . Arthritis Mother   . Colon cancer Father   . Stroke Father   . Arthritis Sister   . Heart disease Brother   . Kidney cancer Brother     ROS: No fever, myalgias/arthralgias, unexplained weight loss or weight gain No new focal weakness or sensory deficits No otalgia, hearing loss, visual changes, nasal and sinus symptoms, mouth and throat problems No neck pain or adenopathy No abdominal pain, N/V/D, diarrhea, change in bowel pattern No dysuria, change in urinary pattern No LE edema or calf tenderness   Filed Vitals:   05/26/15 1111  BP: 130/72  Pulse: 91  Height: 5' 10.5" (1.791 m)  Weight: 178 lb 12.8 oz (81.103 kg)  SpO2: 95%  2 LPM [place]   EXAM:  Gen: tremulous, NAD @ rest HEENT: NCAT, sclera white, oropharynx normal Neck: Supple without LAN, thyromegaly, JVD Lungs: breath sounds moderately diminished, percussion note normal, No wheezes Cardiovascular: Normal rate, reg rhythm, no murmurs noted Abdomen: Soft, nontender, normal BS Ext: without clubbing, cyanosis, edema Neuro: tremor, CNs intact, motor and sensory intact, DTRs symmetric Skin: Limited exam, no lesions noted  DATA:   No flowsheet data found.  No flowsheet data found.   Ambulatory SpO2 on RA: 81%. Recovered back to 95% on 2 lpm [place] with rest  CXR:  N/A  IMPRESSION:  ICD-9-CM ICD-10-CM   1. Hypoxemia 799.02 R09.02   2. Postinflammatory pulmonary fibrosis (HCC) 515 J84.10 DG Chest 2 View     AMB REFERRAL FOR DME  3. Chronic obstructive pulmonary disease, unspecified COPD type ([place]) 496 J44.9   4. Coronary artery disease involving native coronary artery of native heart without angina pectoris 414.01 I25.10 Ambulatory referral to Cardiology   COPD regimen: Spiriva Advair Singulair Albuterol PRN - rarely uses  PLAN:  Cont regimen as above Establish home oxygen  company Cardiology referral to establish care with them ROV 8 weeks with CXR   Merton Border, MD PCCM service Mobile [phone] Pager [phone] 05/27/2015

## 2015-06-03 ENCOUNTER — Ambulatory Visit: Payer: Medicare Other | Admitting: Adult Health

## 2015-06-15 ENCOUNTER — Encounter: Payer: Self-pay | Admitting: Cardiology

## 2015-06-15 ENCOUNTER — Ambulatory Visit (INDEPENDENT_AMBULATORY_CARE_PROVIDER_SITE_OTHER): Payer: Medicare Other | Admitting: Cardiology

## 2015-06-15 VITALS — BP 110/70 | HR 83 | Ht 70.5 in | Wt 183.8 lb

## 2015-06-15 DIAGNOSIS — Z955 Presence of coronary angioplasty implant and graft: Secondary | ICD-10-CM | POA: Diagnosis not present

## 2015-06-15 DIAGNOSIS — I2510 Atherosclerotic heart disease of native coronary artery without angina pectoris: Secondary | ICD-10-CM | POA: Diagnosis not present

## 2015-06-15 DIAGNOSIS — Z951 Presence of aortocoronary bypass graft: Secondary | ICD-10-CM | POA: Diagnosis not present

## 2015-06-15 DIAGNOSIS — E785 Hyperlipidemia, unspecified: Secondary | ICD-10-CM | POA: Diagnosis not present

## 2015-06-15 NOTE — Patient Instructions (Signed)
Medication Instructions:  Your physician recommends that you continue on your current medications as directed. Please refer to the Current Medication list given to you today.   Labwork: None ordered  Testing/Procedures: None ordered  Follow-Up: Your physician wants you to follow-up in: 6 months with Dr. Ingal. You will receive a reminder letter in the mail two months in advance. If you don't receive a letter, please call our office to schedule the follow-up appointment.   Any Other Special Instructions Will Be Listed Below (If Applicable).     If you need a refill on your cardiac medications before your next appointment, please call your pharmacy.   

## 2015-06-15 NOTE — Progress Notes (Signed)
Cardiology Office Note   Date:  06/15/2015   ID:  Bradley Bennett, DOB [birth year], MRN [id number]  Referring Doctor:  Laverda Sorenson, PA-C   Cardiologist:   Wende Bushy, MD   Reason for consultation:  Chief Complaint  Patient presents with  . Coronary Artery Disease    no cp, sob or swelling. not other complaints.  . New Patient (Initial Visit)      History of Present Illness: Bradley Bennett is a 82 y.o. male who presents for Establishing care for history of CAD. Patient moved to the area approximately 2 months ago.  Patient reports a history of heart attack back in 1992 for which he ended up with CABG 4. Afterwards, he had several rounds of stents last one being in 2015.  Currently, he has chronic shortness of breath from an interstitial lung disease. He is on chronic O2. Otherwise, no complaints of chest pain, palpitations, loss of consciousness, dizziness, lightheadedness.  No PND, orthopnea, edema. No fever, cough, colds, abdominal pain.   ROS:  Please see the history of present illness. Aside from mentioned under HPI, all other systems are reviewed and negative.    Past Medical History  Diagnosis Date  . Parkinson disease ([place])   . ILD (interstitial lung disease) ([place])   . Heart attack ([place])   . Hearing difficulty of both ears     Past Surgical History  Procedure Laterality Date  . Vein bypass surgery    . Stents in heart        reports that he has quit smoking. He quit smokeless tobacco use about 25 years ago. He reports that he drinks alcohol. He reports that he does not use illicit drugs.   family history includes Arthritis in his mother and sister; Colon cancer in his father; Heart disease in his brother; Kidney cancer in his brother; Stroke in his father.   Current Outpatient Prescriptions  Medication Sig Dispense Refill  . ALPRAZolam (XANAX) 0.25 MG tablet Take 0.25 mg by mouth at bedtime as needed for anxiety.    Marland Kitchen atorvastatin (LIPITOR) 10 MG tablet  Take 10 mg by mouth daily.    . busPIRone (BUSPAR) 5 MG tablet Take 5 mg by mouth daily.  1  . clopidogrel (PLAVIX) 75 MG tablet Take 75 mg by mouth daily.    . finasteride (PROSCAR) 5 MG tablet Take 5 mg by mouth daily.    . Fluticasone-Salmeterol (ADVAIR DISKUS) 500-50 MCG/DOSE AEPB Inhale 1 puff into the lungs 2 (two) times daily.    Marland Kitchen levothyroxine (SYNTHROID, LEVOTHROID) 137 MCG tablet Take 137 mcg by mouth daily before breakfast.    . Multiple Vitamins-Minerals (CENTRUM VITAMINTS PO) Take 1 tablet by mouth daily.     . NON FORMULARY Take 1 tablet by mouth 3 (three) times daily. Carbo/levo 1-1/2 tab 3x daily 25-100mg     . tamsulosin (FLOMAX) 0.4 MG CAPS capsule Take 0.4 mg by mouth daily.     . traZODone (DESYREL) 50 MG tablet Take 50 mg by mouth at bedtime.    Marland Kitchen umeclidinium bromide (INCRUSE ELLIPTA) 62.5 MCG/INH AEPB Inhale 1 puff into the lungs daily.     No current facility-administered medications for this visit.    Allergies: Isosorbide    PHYSICAL EXAM: VS:  BP 80/58 mmHg  Pulse 83  Ht 5' 10.5" (1.791 m)  Wt 183 lb 12.8 oz (83.371 kg)  BMI 25.99 kg/m2 , Body mass index is 25.99 kg/(m^2). Wt Readings from Last 3 Encounters:  06/15/15 183 lb 12.8 oz (83.371 kg)  05/26/15 178 lb 12.8 oz (81.103 kg)    GENERAL:  well developed, well nourished, not in acute distress HEENT: normocephalic, pink conjunctivae, anicteric sclerae, no xanthelasma, normal dentition, oropharynx clear NECK:  no neck vein engorgement, JVP normal, no hepatojugular reflux, carotid upstroke brisk and symmetric, no bruit, no thyromegaly, no lymphadenopathy LUNGS:  good respiratory effort, clear to auscultation bilaterally CV:  PMI not displaced, no thrills, no lifts, S1 and S2 within normal limits, no palpable S3 or S4, no murmurs, no rubs, no gallops ABD:  Soft, nontender, nondistended, normoactive bowel sounds, no abdominal aortic bruit, no hepatomegaly, no splenomegaly MS: nontender back, no kyphosis, no  scoliosis, no joint deformities EXT:  2+ DP/PT pulses, no edema, no varicosities, no cyanosis, no clubbing SKIN: warm, nondiaphoretic, normal turgor, no ulcers NEUROPSYCH: alert, oriented to person, place, and time, sensory/motor grossly intact, normal mood, appropriate affect  Recent Labs: No results found for requested labs within last 365 days.   Lipid Panel No results found for: CHOL, TRIG, HDL, CHOLHDL, VLDL, LDLCALC, LDLDIRECT   Other studies Reviewed:  EKG:  The ekg from 06/15/2015 was personally reviewed by me and it revealed sinus rhythm, 83 BPM. Q waves in V1.  Additional studies/ records that were reviewed personally reviewed by me today include: None available   ASSESSMENT AND PLAN:  CAD status post CABG 4, 1992, in setting of MI Status post multiple PCI, last one in 2015 Patient denies angina. Chronic shortness of breath from interstitial lung disease. No evidence of congestive heart failure on physical examination. We'll need to obtain records from previous cardiologist. Burnis Medin need to review most recent office note, echocardiogram, stress testing, etc.  In the meantime, agree with Plavix, atorvastatin. LDL goal is less than 70. Patient will be seeing his PCP soon. Likely patient is not on beta blockade/ACE inhibitor/ARB due to borderline blood pressure.  Hyperlipidemia LDL goal less than 70  Current medicines are reviewed at length with the patient today.  The patient does not have concerns regarding medicines.  Labs/ tests ordered today include:  Orders Placed This Encounter  Procedures  . EKG 12-Lead    I had a lengthy and detailed discussion with the patient regarding diagnoses, prognosis, diagnostic options, treatment options, and side effects of medications.   I counseled the patient on importance of lifestyle modification including heart healthy diet, regular physical activity .   Disposition:   FU with undersigned In 6 months. Will await records  from previous cardiologist.  I spent at least 45 minutes with the patient today and more than 50% of the time was spent counseling the patient and coordinating care.    Signed, Wende Bushy, MD  06/15/2015 3:26 PM    [place]

## 2015-07-09 ENCOUNTER — Telehealth: Payer: Self-pay | Admitting: Pulmonary Disease

## 2015-07-09 NOTE — Telephone Encounter (Signed)
Attempted  to r/s appt 6/15 with Dr. Alva Garnet.  Schedule changed and provider out of office.  Can see Kasa same day or r/s with [place] .

## 2015-07-16 ENCOUNTER — Encounter: Payer: Self-pay | Admitting: Internal Medicine

## 2015-07-16 ENCOUNTER — Ambulatory Visit (INDEPENDENT_AMBULATORY_CARE_PROVIDER_SITE_OTHER)
Admission: RE | Admit: 2015-07-16 | Discharge: 2015-07-16 | Disposition: A | Discharge status: Home / Self Care | Payer: Medicare Other | Source: Ambulatory Visit | Attending: Internal Medicine | Admitting: Internal Medicine

## 2015-07-16 ENCOUNTER — Ambulatory Visit (INDEPENDENT_AMBULATORY_CARE_PROVIDER_SITE_OTHER): Payer: Medicare Other | Admitting: Internal Medicine

## 2015-07-16 VITALS — BP 134/84 | HR 81 | Temp 97.9°F | Resp 18 | Wt 189.0 lb

## 2015-07-16 DIAGNOSIS — G20A1 Parkinson's disease without dyskinesia, without mention of fluctuations: Secondary | ICD-10-CM

## 2015-07-16 DIAGNOSIS — E039 Hypothyroidism, unspecified: Secondary | ICD-10-CM | POA: Insufficient documentation

## 2015-07-16 DIAGNOSIS — G20 Parkinson's disease: Secondary | ICD-10-CM

## 2015-07-16 DIAGNOSIS — I2510 Atherosclerotic heart disease of native coronary artery without angina pectoris: Secondary | ICD-10-CM

## 2015-07-16 DIAGNOSIS — M545 Low back pain, unspecified: Secondary | ICD-10-CM

## 2015-07-16 DIAGNOSIS — G5601 Carpal tunnel syndrome, right upper limb: Secondary | ICD-10-CM

## 2015-07-16 DIAGNOSIS — M47816 Spondylosis without myelopathy or radiculopathy, lumbar region: Secondary | ICD-10-CM | POA: Diagnosis not present

## 2015-07-16 DIAGNOSIS — F419 Anxiety disorder, unspecified: Secondary | ICD-10-CM

## 2015-07-16 DIAGNOSIS — E038 Other specified hypothyroidism: Secondary | ICD-10-CM

## 2015-07-16 DIAGNOSIS — J8410 Pulmonary fibrosis, unspecified: Secondary | ICD-10-CM | POA: Diagnosis not present

## 2015-07-16 DIAGNOSIS — J449 Chronic obstructive pulmonary disease, unspecified: Secondary | ICD-10-CM

## 2015-07-16 DIAGNOSIS — G8929 Other chronic pain: Secondary | ICD-10-CM | POA: Diagnosis not present

## 2015-07-16 DIAGNOSIS — G4700 Insomnia, unspecified: Secondary | ICD-10-CM

## 2015-07-16 DIAGNOSIS — R0902 Hypoxemia: Secondary | ICD-10-CM | POA: Diagnosis not present

## 2015-07-16 DIAGNOSIS — R739 Hyperglycemia, unspecified: Secondary | ICD-10-CM

## 2015-07-16 DIAGNOSIS — E785 Hyperlipidemia, unspecified: Secondary | ICD-10-CM

## 2015-07-16 DIAGNOSIS — N400 Enlarged prostate without lower urinary tract symptoms: Secondary | ICD-10-CM

## 2015-07-16 DIAGNOSIS — H3530 Unspecified macular degeneration: Secondary | ICD-10-CM | POA: Insufficient documentation

## 2015-07-16 MED ORDER — TRAZODONE HCL 50 MG PO TABS: 50.0000 mg | ORAL_TABLET | Freq: Every day | ORAL | Status: DC

## 2015-07-16 MED ORDER — BUSPIRONE HCL 5 MG PO TABS: 5.0000 mg | ORAL_TABLET | Freq: Every day | ORAL | Status: DC

## 2015-07-16 MED ORDER — FINASTERIDE 5 MG PO TABS: 5.0000 mg | ORAL_TABLET | Freq: Every day | ORAL | Status: DC

## 2015-07-16 NOTE — Assessment & Plan Note (Signed)
Following with pulmonary With hypoxemia on oxygen continuously Using incruse

## 2015-07-16 NOTE — Assessment & Plan Note (Signed)
S/p surgery which helped, but now with overactive bladder symptoms, some incontinence Taking proscar and flomax Will refer to urology

## 2015-07-16 NOTE — Assessment & Plan Note (Signed)
?   H/o diabetes Check a1c

## 2015-07-16 NOTE — Assessment & Plan Note (Signed)
Following with pulmonary On oxygen 3-4 lpm

## 2015-07-16 NOTE — Assessment & Plan Note (Signed)
Refer to neurology to establish Continue current medication Encouraged increased activity Referred to PT for back pain and to help increase activity

## 2015-07-16 NOTE — Assessment & Plan Note (Signed)
Controlled on buspar 5 mg daily Has xanax that he can take if needed, but has not taken it in a long time - encouraged him to avoid taking if possible - discussed possible side effects

## 2015-07-16 NOTE — Assessment & Plan Note (Signed)
DOE likely related to COPD No concerning cardiac symptoms Following with cardiology Continue current medication

## 2015-07-16 NOTE — Progress Notes (Signed)
Subjective:    Patient ID: Bradley Bennett, male    DOB: 1932-06-20, 82 y.o.   MRN: [id number]  HPI He is here to establish with a new pcp.   He just moved to the area two months ago.  His daughter is here with him.    Lower back pain:  He is having chronic lower back pain and arthritis in the lower back.  He did have a L5 vertebral fracture in the past.  She cannot get out of bed without stretching his hamstrings.  He denies pain radiating into his legs and denies numbness/tingling in his legs.  He has not seen anyone for the back and has not had an imaging done recently.    BPH:  He has had surgery for BPH about 15 years ago and it helped, but recently the prostate has gotten large again.  He is taking proscar and flomax daily.  He has frequent urination and nocturia x several times.  He has some incontinence.  He denies any difficulty initiating urination or emptying his bladder.  He also has fecal incontinence at times. His bowel movements are typically normal, but he will have bouts of loose stool.    Parkinson's disease, dysarthria:  He is taking his medication daily.  He needs a referral to neuro.  He is not currently not exercising regularly.  He tires easily.  He has a walker, but does not use it much.  He does have urinary and bowel incontinence.    Hypothyroidism:  He is taking his medication daily.  He denies any recent changes in energy or weight that are unexplained.   CAD:  He has already seen Cardiology here.  He had a CABG x 4 and has had stent placement as well.  He is taking all his medication daily.  He denies chest pain, palpitations, edema and regular headaches.  He has some SOB and tires easily with exertion.  This is thought to be related to his COPD.  COPD, hypoxemia:  He has also seen pulmonary.  He is on oxygen continuously 3-4 L/min.  He is not very active and get SOB with exertion.  He denies a cough or wheeze.   He was told he had diabetes in the past but was not  told what to do about this. He has never been on medication.  He thinks his last blood work was in the fall.    Hyperlipidemia: He is taking his medication daily. He is compliant with a low fat/cholesterol diet. He is not exercising regularly. He denies myalgias.   Anxiety: He is taking his medication daily as prescribed (buspar).  He also has xanax to use if needed and has not taken this in a long time.  He denies any side effects from the medication. He feels his anxiety is well controlled and he is happy with his current dose of medication.   Insomnia:  He is taking the trazodone nightly. He does have to get up to urinate, but sleeps well. He feels the medication is working well.     Medications and allergies reviewed with patient and updated if appropriate.  Patient Active Problem List   Diagnosis Date Noted  . COPD (chronic obstructive pulmonary disease) ([place]) 07/16/2015  . Hypoxemia 07/16/2015  . Postinflammatory pulmonary fibrosis ([place]) 07/16/2015  . Parkinson disease ([place]) 07/16/2015  . Hypothyroidism 07/16/2015  . Insomnia 07/16/2015  . BPH (benign prostatic hypertrophy) 07/16/2015  . Anxiety 07/16/2015  . CAD in native  artery 06/15/2015  . 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 S/P coronary artery stent placement 06/15/2015  . Hyperlipidemia 06/15/2015    Current Outpatient Prescriptions on File Prior to Visit  Medication Sig Dispense Refill  . ALPRAZolam (XANAX) 0.25 MG tablet Take 0.25 mg by mouth at bedtime as needed for anxiety.    Marland Kitchen atorvastatin (LIPITOR) 10 MG tablet Take 10 mg by mouth daily.    . busPIRone (BUSPAR) 5 MG tablet Take 5 mg by mouth daily.  1  . clopidogrel (PLAVIX) 75 MG tablet Take 75 mg by mouth daily.    . finasteride (PROSCAR) 5 MG tablet Take 5 mg by mouth daily.    . Fluticasone-Salmeterol (ADVAIR DISKUS) 500-50 MCG/DOSE AEPB Inhale 1 puff into the lungs 2 (two) times daily.    Marland Kitchen levothyroxine (SYNTHROID, LEVOTHROID) 137 MCG tablet Take 137 mcg by mouth  daily before breakfast.    . Multiple Vitamins-Minerals (CENTRUM VITAMINTS PO) Take 1 tablet by mouth daily.     . NON FORMULARY Take 1 tablet by mouth 3 (three) times daily. Carbo/levo 1-1/2 tab 3x daily 25-100mg     . tamsulosin (FLOMAX) 0.4 MG CAPS capsule Take 0.4 mg by mouth daily.     . traZODone (DESYREL) 50 MG tablet Take 50 mg by mouth at bedtime.    Marland Kitchen umeclidinium bromide (INCRUSE ELLIPTA) 62.5 MCG/INH AEPB Inhale 1 puff into the lungs daily.     No current facility-administered medications on file prior to visit.    Past Medical History  Diagnosis Date  . Parkinson disease ([place])   . ILD (interstitial lung disease) ([place])   . Heart attack ([place])   . Hearing difficulty of both ears   . Asthma   . Arthritis   . Emphysema of lung ([place])   . Heart murmur   . Hyperlipidemia   . Hypertension     Past Surgical History  Procedure Laterality Date  . Vein bypass surgery    . Stents in heart       Social History   Social History  . Marital Status: Divorced    Spouse Name: N/A  . Number of Children: N/A  . Years of Education: N/A   Social History Main Topics  . Smoking status: Former Smoker -- 2.00 packs/day for 35 years  . Smokeless tobacco: Former Systems developer    Quit date: 05/13/1990  . Alcohol Use: Yes     Comment: occ  . Drug Use: No  . Sexual Activity: Not Asked   Other Topics Concern  . None   Social History Narrative    Family History  Problem Relation Age of Onset  . Arthritis Mother   . Colon cancer Father   . Stroke Father   . Arthritis Sister   . Heart disease Brother   . Kidney cancer Brother     Review of Systems  Constitutional: Positive for fatigue. Negative for fever, chills, appetite change and unexpected weight change.  Eyes: Negative for visual disturbance.  Respiratory: Positive for shortness of breath (50 feet). Negative for cough and wheezing.   Cardiovascular: Negative for chest pain, palpitations and leg swelling.  Gastrointestinal:  Negative for nausea, abdominal pain, diarrhea, constipation and blood in stool.       No gerd occ loose stools occ fecal incontinence  Genitourinary: Positive for frequency. Negative for dysuria, hematuria and difficulty urinating.       Occ incontinence  Musculoskeletal: Positive for back pain.  Neurological: Positive for numbness (bottom of feet,  right fingers from CTS). Negative for dizziness, light-headedness and headaches.  Psychiatric/Behavioral: Negative for dysphoric mood. The patient is nervous/anxious (controlled - related to breathing difficulty).        Objective:   Filed Vitals:   07/16/15 0833  BP: 134/84  Pulse: 81  Temp: 97.9 F (36.6 C)  Resp: 18   Filed Weights   07/16/15 0833  Weight: 189 lb (85.73 kg)   Body mass index is 26.73 kg/(m^2).   Physical Exam Constitutional: He appears well-developed and well-nourished. No distress. Has  on.  HENT:  Head: Normocephalic and atraumatic.  Right Ear: External ear normal.  Wearing hearing aides. Left Ear: External ear normal.  Mouth/Throat: Oropharynx is clear and moist.  Eyes: Conjunctivae and EOM are normal.  Neck: Neck supple. No tracheal deviation present. No thyromegaly present.  No carotid bruit  Cardiovascular: Normal rate, regular rhythm, normal heart sounds and intact distal pulses.  No murmur heard. Pulmonary/Chest: Effort normal and breath sounds normal. No respiratory distress. He has no wheezes. He has no rales.  Abdominal: Soft. He exhibits no distension. There is no tenderness.  Musculoskeletal: He exhibits trace edema.  Lymphadenopathy:   He has no cervical adenopathy.  Skin: Skin is warm and dry. He is not diaphoretic.  Psychiatric: He has a normal mood and affect. His behavior is normal.        Assessment & Plan:   See Problem List for Assessment and Plan of chronic medical problems.   F/u in 6 months, sooner if needed

## 2015-07-16 NOTE — Patient Instructions (Addendum)
  Test(s) ordered today. Your results will be released to [place] (or called to you) after review, usually within 72hours after test completion. If any changes need to be made, you will be notified at that same time.   No immunizations administered today.   Medications reviewed and updated.  No changes recommended at this time.  Your prescription(s) have been submitted to your pharmacy. Please take as directed and contact our office if you believe you are having problem(s) with the medication(s).  A referral was ordered for physical therapy, urology and neurology.   Please followup in 6 months

## 2015-07-16 NOTE — Assessment & Plan Note (Signed)
Xray today Will refer for PT

## 2015-07-16 NOTE — Assessment & Plan Note (Signed)
Check lipids On lipitor 10 mg

## 2015-07-16 NOTE — Assessment & Plan Note (Signed)
Check tsh  Titrate med dose if needed  

## 2015-07-16 NOTE — Assessment & Plan Note (Signed)
Controlled with trazodone Continue current dose of trazodone

## 2015-07-16 NOTE — Progress Notes (Signed)
Pre visit review using our clinic review tool, if applicable. No additional management support is needed unless otherwise documented below in the visit note. 

## 2015-07-17 ENCOUNTER — Encounter: Payer: Self-pay | Admitting: Internal Medicine

## 2015-07-22 ENCOUNTER — Ambulatory Visit: Payer: Medicare Other | Admitting: Pulmonary Disease

## 2015-07-23 ENCOUNTER — Other Ambulatory Visit (INDEPENDENT_AMBULATORY_CARE_PROVIDER_SITE_OTHER): Payer: Medicare Other

## 2015-07-23 DIAGNOSIS — J449 Chronic obstructive pulmonary disease, unspecified: Secondary | ICD-10-CM | POA: Diagnosis not present

## 2015-07-23 DIAGNOSIS — G8929 Other chronic pain: Secondary | ICD-10-CM

## 2015-07-23 DIAGNOSIS — J8410 Pulmonary fibrosis, unspecified: Secondary | ICD-10-CM

## 2015-07-23 DIAGNOSIS — G20A1 Parkinson's disease without dyskinesia, without mention of fluctuations: Secondary | ICD-10-CM

## 2015-07-23 DIAGNOSIS — R739 Hyperglycemia, unspecified: Secondary | ICD-10-CM | POA: Diagnosis not present

## 2015-07-23 DIAGNOSIS — M545 Low back pain, unspecified: Secondary | ICD-10-CM

## 2015-07-23 DIAGNOSIS — F419 Anxiety disorder, unspecified: Secondary | ICD-10-CM

## 2015-07-23 DIAGNOSIS — I2510 Atherosclerotic heart disease of native coronary artery without angina pectoris: Secondary | ICD-10-CM

## 2015-07-23 DIAGNOSIS — E038 Other specified hypothyroidism: Secondary | ICD-10-CM

## 2015-07-23 DIAGNOSIS — G20 Parkinson's disease: Secondary | ICD-10-CM

## 2015-07-23 DIAGNOSIS — R0902 Hypoxemia: Secondary | ICD-10-CM

## 2015-07-23 DIAGNOSIS — G5601 Carpal tunnel syndrome, right upper limb: Secondary | ICD-10-CM

## 2015-07-23 DIAGNOSIS — G4700 Insomnia, unspecified: Secondary | ICD-10-CM

## 2015-07-23 DIAGNOSIS — N400 Enlarged prostate without lower urinary tract symptoms: Secondary | ICD-10-CM

## 2015-07-23 LAB — LIPID PANEL
CHOLESTEROL: 172 mg/dL (ref 0–200)
HDL: 57.6 mg/dL (ref >39.00)
LDL CALC: 92 mg/dL (ref 0–99)
NONHDL: 114.06
Total CHOL/HDL Ratio: 3
Triglycerides: 108 mg/dL (ref 0.0–149.0)
VLDL: 21.6 mg/dL (ref 0.0–40.0)

## 2015-07-23 LAB — CBC WITH DIFFERENTIAL/PLATELET
BASOS PCT: 0.4 % (ref 0.0–3.0)
Basophils Absolute: 0 10*3/uL (ref 0.0–0.1)
EOS PCT: 4.8 % (ref 0.0–5.0)
Eosinophils Absolute: 0.3 10*3/uL (ref 0.0–0.7)
HCT: 43.6 % (ref 39.0–52.0)
Hemoglobin: 14.1 g/dL (ref 13.0–17.0)
LYMPHS ABS: 2.2 10*3/uL (ref 0.7–4.0)
Lymphocytes Relative: 31.8 % (ref 12.0–46.0)
MCHC: 32.4 g/dL (ref 30.0–36.0)
MCV: 85.8 fl (ref 78.0–100.0)
MONO ABS: 0.7 10*3/uL (ref 0.1–1.0)
MONOS PCT: 9.4 % (ref 3.0–12.0)
NEUTROS ABS: 3.7 10*3/uL (ref 1.4–7.7)
NEUTROS PCT: 53.6 % (ref 43.0–77.0)
Platelets: 264 10*3/uL (ref 150.0–400.0)
RBC: 5.08 Mil/uL (ref 4.22–5.81)
RDW: 19.1 % — AB (ref 11.5–15.5)
WBC: 7 10*3/uL (ref 4.0–10.5)

## 2015-07-23 LAB — COMPREHENSIVE METABOLIC PANEL
ALK PHOS: 85 U/L (ref 39–117)
ALT: 4 U/L (ref 0–53)
AST: 18 U/L (ref 0–37)
Albumin: 4 g/dL (ref 3.5–5.2)
BUN: 19 mg/dL (ref 6–23)
CHLORIDE: 103 meq/L (ref 96–112)
CO2: 28 mEq/L (ref 19–32)
Calcium: 9.7 mg/dL (ref 8.4–10.5)
Creatinine, Ser: 0.88 mg/dL (ref 0.40–1.50)
GFR: 88.31 mL/min (ref >60.00)
GLUCOSE: 99 mg/dL (ref 70–99)
POTASSIUM: 4.4 meq/L (ref 3.5–5.1)
SODIUM: 139 meq/L (ref 135–145)
TOTAL PROTEIN: 7.4 g/dL (ref 6.0–8.3)
Total Bilirubin: 0.7 mg/dL (ref 0.2–1.2)

## 2015-07-23 LAB — TSH: TSH: 8.75 u[IU]/mL — AB (ref 0.35–4.50)

## 2015-07-23 LAB — HEMOGLOBIN A1C: Hgb A1c MFr Bld: 5.7 % (ref 4.6–6.5)

## 2015-07-24 ENCOUNTER — Other Ambulatory Visit: Payer: Self-pay | Admitting: Internal Medicine

## 2015-07-24 DIAGNOSIS — E038 Other specified hypothyroidism: Secondary | ICD-10-CM

## 2015-07-24 MED ORDER — LEVOTHYROXINE SODIUM 150 MCG PO TABS: 150.0000 ug | ORAL_TABLET | Freq: Every day | ORAL | Status: DC

## 2015-07-26 ENCOUNTER — Telehealth: Payer: Self-pay

## 2015-07-26 NOTE — Telephone Encounter (Signed)
Daughter called back. I informed her of the labs and the new rx being sent. She understood.

## 2015-07-26 NOTE — Telephone Encounter (Signed)
Noted  

## 2015-07-27 DIAGNOSIS — M545 Low back pain: Secondary | ICD-10-CM | POA: Diagnosis not present

## 2015-07-28 ENCOUNTER — Ambulatory Visit (INDEPENDENT_AMBULATORY_CARE_PROVIDER_SITE_OTHER): Payer: Medicare Other | Admitting: Pulmonary Disease

## 2015-07-28 ENCOUNTER — Ambulatory Visit
Admission: RE | Admit: 2015-07-28 | Discharge: 2015-07-28 | Disposition: A | Discharge status: Home / Self Care | Payer: Medicare Other | Source: Ambulatory Visit | Attending: Pulmonary Disease | Admitting: Pulmonary Disease

## 2015-07-28 ENCOUNTER — Encounter: Payer: Self-pay | Admitting: Pulmonary Disease

## 2015-07-28 VITALS — BP 106/60 | HR 91 | Ht 70.5 in | Wt 184.0 lb

## 2015-07-28 DIAGNOSIS — I2510 Atherosclerotic heart disease of native coronary artery without angina pectoris: Secondary | ICD-10-CM | POA: Diagnosis not present

## 2015-07-28 DIAGNOSIS — Z955 Presence of coronary angioplasty implant and graft: Secondary | ICD-10-CM | POA: Insufficient documentation

## 2015-07-28 DIAGNOSIS — J449 Chronic obstructive pulmonary disease, unspecified: Secondary | ICD-10-CM

## 2015-07-28 DIAGNOSIS — I700 Atherosclerosis of aorta: Secondary | ICD-10-CM | POA: Diagnosis not present

## 2015-07-28 DIAGNOSIS — J8410 Pulmonary fibrosis, unspecified: Secondary | ICD-10-CM | POA: Diagnosis not present

## 2015-07-28 NOTE — Patient Instructions (Signed)
Continue Advair and Incruse Follow up in 4-6 months

## 2015-07-29 ENCOUNTER — Encounter: Payer: Self-pay | Admitting: Pulmonary Disease

## 2015-07-29 NOTE — Progress Notes (Signed)
PULMONARY OFFICE FOLLOW UP NOTE  Requesting MD/Service: Self referral Date of initial consultation: 05/26/15 Reason for consultation: COPD, ILD, Chronic hypoxemia  PT PROFILE: 82 y.o. M self referred, former smoker, previously followed by pulmonologist in [place], chronically O2 dependent, history of COPD, post inflammatory pulmonary fibrosis, Parkinson's disease, chronic aspiration  INITIAL HPI:  82 yo M with above history moved recently from [place] and here to establish pulmonary care. He has COPD, ILD, chronic aspiration diagnosed by [facility] in 2016 (presumably related to Parkinson's). He has been hospitalized twice for PNA in the past 2 years. He has also recently undergone inguinal and umbilical hernia repairs in Sept 2016. He has a prior history of CAD and has undergone CABG and stent placement. At his baseline, he is moderately to severely limited by dyspnea and fatigue as well as by Parkinson's. He is presently @ his baseline. Denies CP, fever, purulent sputum, hemoptysis, LE edema and calf tenderness. He wears O2 chronically @ 3-4 lpm by [place] INITIAL IMPRESSION/PLAN: Chronic hypoxic respiratory failure due to mild COPD and severe pulmonary fibrosis due to chronic aspiration  SUBJ: No new complaints. No distress. Denies CP, fever, purulent sputum, hemoptysis, LE edema and calf tenderness   OBJ:  Filed Vitals:   07/28/15 1046  BP: 106/60  Pulse: 91  Height: 5' 10.5" (1.791 m)  Weight: 184 lb (83.462 kg)  SpO2: 93%  3 LPM [place] (pulse setting)   EXAM:  Gen: tremulous, NAD @ rest HEENT: NCAT, sclera white, oropharynx normal Neck: Supple without LAN, thyromegaly, JVD Lungs: Bilateral crackles, no wheezes Cardiovascular: Normal rate, reg rhythm, no murmurs noted Abdomen: Soft, nontender, normal BS Ext: without clubbing, cyanosis, edema Neuro: coarse tremor, no focal deficits  DATA:   BMP Latest Ref Rng 07/23/2015  Glucose 70 - 99 mg/dL 99  BUN 6 - 23 mg/dL 19  Creatinine 0.40  - 1.50 mg/dL 0.88  Sodium 135 - 145 mEq/L 139  Potassium 3.5 - 5.1 mEq/L 4.4  Chloride 96 - 112 mEq/L 103  CO2 19 - 32 mEq/L 28  Calcium 8.4 - 10.5 mg/dL 9.7    CBC Latest Ref Rng 07/23/2015  WBC 4.0 - 10.5 K/uL 7.0  Hemoglobin 13.0 - 17.0 g/dL 14.1  Hematocrit 39.0 - 52.0 % 43.6  Platelets 150.0 - 400.0 K/uL 264.0      CXR (07/28/15): extensive fibrosis    IMPRESSION:   Chronic hypoxemic respiratory failure due to: 1) COPD - suspect mild obstruction but he seems to benefit from bronchodilator therapy COPD regimen: Incruse Advair Albuterol PRN - rarely uses  2) Post inflammatory pulmonary fibrosis/chronic aspiration  Due to Parkinson's related dysphagia   PLAN:  Cont regimen as above Continue supplemental O2 by [place] Discussed aspiration precautions - he voiced great reluctance to use thickening agents   ROV 4-6 months   Merton Border, MD PCCM service Mobile [phone] Pager [phone] 07/29/2015

## 2015-07-30 DIAGNOSIS — M545 Low back pain: Secondary | ICD-10-CM | POA: Diagnosis not present

## 2015-08-03 DIAGNOSIS — M545 Low back pain: Secondary | ICD-10-CM | POA: Diagnosis not present

## 2015-08-05 DIAGNOSIS — M545 Low back pain: Secondary | ICD-10-CM | POA: Diagnosis not present

## 2015-08-09 DIAGNOSIS — M545 Low back pain: Secondary | ICD-10-CM | POA: Diagnosis not present

## 2015-08-16 ENCOUNTER — Ambulatory Visit (INDEPENDENT_AMBULATORY_CARE_PROVIDER_SITE_OTHER): Payer: Medicare Other | Admitting: Neurology

## 2015-08-16 ENCOUNTER — Encounter: Payer: Self-pay | Admitting: Neurology

## 2015-08-16 VITALS — BP 104/60 | HR 100 | Ht 70.5 in | Wt 186.0 lb

## 2015-08-16 DIAGNOSIS — G20 Parkinson's disease: Secondary | ICD-10-CM | POA: Diagnosis not present

## 2015-08-16 DIAGNOSIS — I2510 Atherosclerotic heart disease of native coronary artery without angina pectoris: Secondary | ICD-10-CM

## 2015-08-16 DIAGNOSIS — R292 Abnormal reflex: Secondary | ICD-10-CM | POA: Diagnosis not present

## 2015-08-16 DIAGNOSIS — F458 Other somatoform disorders: Secondary | ICD-10-CM | POA: Diagnosis not present

## 2015-08-16 DIAGNOSIS — R1319 Other dysphagia: Secondary | ICD-10-CM

## 2015-08-16 MED ORDER — CARBIDOPA-LEVODOPA ER 50-200 MG PO TBCR: 1.0000 | EXTENDED_RELEASE_TABLET | Freq: Every day | ORAL | Status: DC

## 2015-08-16 MED ORDER — CARBIDOPA-LEVODOPA 25-100 MG PO TABS: 2.0000 | ORAL_TABLET | Freq: Three times a day (TID) | ORAL | Status: DC

## 2015-08-16 NOTE — Patient Instructions (Signed)
1. Increase Carbidopa Levodopa 25/100 IR to 2 tablets at 8 am, 2 tablets at 12 pm, and 2 tablets at 4 pm Add Carbidopa Levodopa 50/200 CR- 1 tablet at bedtime  2. We have sent a referral to [facility] for your MRI and they will call you directly to schedule your appt. They are located at [place]. If you need to contact them directly please call [phone].

## 2015-08-16 NOTE — Progress Notes (Signed)
Bradley Bennett was seen today in the movement disorders clinic for neurologic consultation at the request of [name], MD.  The consultation is for the evaluation of PD.  This patient is accompanied in the office by his child who supplements the history.  Pt just moved here from [place].  I don't have any previous neurology records.  Pt reports that he was dx with PD about 4 years ago per pt and about 10 years ago per daughter.  His first sx was L hand tremor.  Daughter states he was on something else prior to the levodopa but she isn't sure that it was specifically for PD.  He is currently on carbidopa/levodopa 25/100, 1.5 tablets tid (8:30am/5:30/9pm).  Daughter thinks that the med wears off   Specific Symptoms:  Tremor: Yes.  . L more than R hand and sometimes L leg Family hx of similar:  No. Voice: yes,  Sleep: sleeps well but nocturia x 1  Vivid Dreams:  No.  Acting out dreams:  No. Wet Pillows: Yes.   Postural symptoms:  Yes.  , but been doing PT  Falls?  No. Bradykinesia symptoms: shuffling gait, slow movements and difficulty getting out of a chair Loss of smell:  Yes.   Loss of taste:  Yes.   Urinary Incontinence:  No. Difficulty Swallowing:  Yes.   ; last swallow test 1.5 years ago.  Per pulm records, pt does have a hx of dysphagia with chronic aspiration/post inflammatory pulm fibrosis but refuses thickening agents. Handwriting, micrographia: No. Trouble with ADL's:  Yes.    Trouble buttoning clothing: Yes.   Depression:  Yes.   (states that will get anxious about breathing but its better and so is depression) Memory changes:  Yes.   ("good days and bad" - plans to get a  drivers license as just moved here; lives independent assisted living.  Daughter sets up pill box and he takes med.  Moved to be closer to daughter) Hallucinations:  No.  visual distortions: No. N/V:  No. Lightheaded:  No.  Syncope: No. Diplopia:  No. Dyskinesia:  No.  PREVIOUS MEDICATIONS:  Sinemet  ALLERGIES:   Allergies  Allergen Reactions  . Isosorbide     CURRENT MEDICATIONS:  Outpatient Encounter Prescriptions as of 08/16/2015  Medication Sig  . acetaminophen (TYLENOL) 500 MG tablet Take 500 mg by mouth as needed.  . ALPRAZolam (XANAX) 0.25 MG tablet Take 0.25 mg by mouth at bedtime as needed for anxiety.  Marland Kitchen atorvastatin (LIPITOR) 10 MG tablet Take 10 mg by mouth daily.  . busPIRone (BUSPAR) 5 MG tablet Take 1 tablet (5 mg total) by mouth daily.  . carbidopa-levodopa (SINEMET IR) 25-100 MG tablet Take 1.5 tablets by mouth 3 (three) times daily.  . clopidogrel (PLAVIX) 75 MG tablet Take 75 mg by mouth daily.  . finasteride (PROSCAR) 5 MG tablet Take 1 tablet (5 mg total) by mouth daily.  . Fluticasone-Salmeterol (ADVAIR DISKUS) 500-50 MCG/DOSE AEPB Inhale 1 puff into the lungs 2 (two) times daily.  Marland Kitchen levothyroxine (SYNTHROID, LEVOTHROID) 150 MCG tablet Take 1 tablet (150 mcg total) by mouth daily.  . Multiple Vitamins-Minerals (CENTRUM VITAMINTS PO) Take 1 tablet by mouth daily.   . tamsulosin (FLOMAX) 0.4 MG CAPS capsule Take 0.4 mg by mouth daily.   . traZODone (DESYREL) 50 MG tablet Take 1 tablet (50 mg total) by mouth at bedtime.  Marland Kitchen umeclidinium bromide (INCRUSE ELLIPTA) 62.5 MCG/INH AEPB Inhale 1 puff into the lungs daily.  . [  DISCONTINUED] carbidopa-levodopa (PARCOPA) 25-100 MG disintegrating tablet Take 1.5 tablets by mouth 3 (three) times daily.   . [DISCONTINUED] NON FORMULARY Take 1 tablet by mouth 3 (three) times daily. Carbo/levo 1-1/2 tab 3x daily 25-100mg    No facility-administered encounter medications on file as of 08/16/2015.    PAST MEDICAL HISTORY:   Past Medical History  Diagnosis Date  . Parkinson disease ([place])   . ILD (interstitial lung disease) ([place])   . Heart attack ([place])   . Hearing difficulty of both ears   . Asthma   . Arthritis   . Emphysema of lung ([place])   . Heart murmur   . Hyperlipidemia   . Hypertension     PAST SURGICAL  HISTORY:   Past Surgical History  Procedure Laterality Date  . Vein bypass surgery    . Coronary angioplasty with stent placement    . Carpal tunnel release Right 03/2014  . Hernia repair  10/2013    x3     SOCIAL HISTORY:   Social History   Social History  . Marital Status: Divorced    Spouse Name: N/A  . Number of Children: N/A  . Years of Education: N/A   Occupational History  . retired     [place] History Main Topics  . Smoking status: Former Smoker -- 2.00 packs/day for 35 years    Quit date: 08/15/1985  . Smokeless tobacco: Never Used     Comment: quit smoking in 1992  . Alcohol Use: 0.0 oz/week    0 Standard drinks or equivalent per week     Comment: twice every 6 months  . Drug Use: No  . Sexual Activity: Not on file   Other Topics Concern  . Not on file   Social History Narrative    FAMILY HISTORY:   Family Status  Relation Status Death Age  . Father Deceased     stroke, colon cancer  . Mother Deceased     arthritis, ruptured spleen  . Brother Deceased     MI, DM, kidney cancer  . Sister Alive     lupus  . Sister Alive     heart disease, DM  . Daughter Alive     2, healthy  . Son Alive     1, healthy    ROS:  A complete 10 system review of systems was obtained and was unremarkable apart from what is mentioned above.  PHYSICAL EXAMINATION:    VITALS:   Filed Vitals:   08/16/15 1422  BP: 104/60  Pulse: 100  Height: 5' 10.5" (1.791 m)  Weight: 186 lb (84.369 kg)    GEN:  The patient appears stated age and is in NAD. HEENT:  Normocephalic, atraumatic.  The mucous membranes are moist. The superficial temporal arteries are without ropiness or tenderness. CV:  RRR Lungs:  CTAB.  He is wearing O2 Neck/HEME:  There are no carotid bruits bilaterally.  Neurological examination:  Orientation:  Montreal Cognitive Assessment  08/16/2015  Visuospatial/ Executive (0/5) 5  Naming (0/3) 3  Attention: Read list of digits (0/2) 2  Attention:  Read list of letters (0/1) 1  Attention: Serial 7 subtraction starting at 100 (0/3) 3  Language: Repeat phrase (0/2) 2  Language : Fluency (0/1) 1  Abstraction (0/2) 2  Delayed Recall (0/5) 2  Orientation (0/6) 6  Total 27  Adjusted Score (based on education) 27   Cranial nerves: There is good facial symmetry. Pupils are equal round and reactive  to light bilaterally. Fundoscopic exam reveals clear margins bilaterally. Extraocular muscles are intact. The visual fields are full to confrontational testing. The speech is fluent and clear. Soft palate rises symmetrically and there is no tongue deviation. Hearing is decreased to conversational tone. Sensation: Sensation is intact to light and pinprick throughout (facial, trunk, extremities). Vibration is intact at the bilateral big toe. There is no extinction with double simultaneous stimulation. There is no sensory dermatomal level identified. Motor: Strength is 5/5 in the bilateral upper and lower extremities.   Shoulder shrug is equal and symmetric.  There is no pronator drift. Deep tendon reflexes: Deep tendon reflexes are 2+-3/4 at the bilateral biceps, triceps, brachioradialis, 3+patella and achilles. Plantar responses are downgoing bilaterally.  Movement examination: Tone: There is mild increased tone in the RUE and mod in the L.    The tone in the lower extremities is normal.  Abnormal movements: There is LUE resting tremor that is near constant but with distraction a moderate RUE resting tremor is noted Coordination:  There is decremation with RAM's, with any form of RAMS, including alternating supination and pronation of the forearm, hand opening and closing, finger taps, heel taps and toe taps on the L.  He is better on the right Gait and Station: The patient has minimal difficulty arising out of a deep-seated chair without the use of the hands. The patient's stride length is good with reemergent tremor on the left.     ASSESSMENT/PLAN:  1.  idiopathic Parkinson's disease.  The patient has tremor, bradykinesia, rigidity and Minimal postural instability.  -We discussed the diagnosis as well as pathophysiology of the disease.  We discussed treatment options as well as prognostic indicators.  Patient education was provided.  -Greater than 50% of the 60 minute visit was spent in counseling answering questions and talking about what to expect now as well as in the future.  We talked about medication options as well as potential future surgical options.  We talked about safety in the home.  -We talked about the way he is currently dosing his levodopa and decided to increase the dose and move it together closer.  He will start taking carbidopa/levodopa 25/100, 2 tablets at 8 AM/noon/4 PM and then we will add carbidopa/levodopa 50/200 at bedtime.  -He really cannot exercise because of pulmonary disease, but encouraged him to do physical therapy every 6 months.  He just completed physical therapy.  2.  Hyperreflexia  -I told the patient this could be from cervical disc disease or cerebrovascular disease or could be physiologic.  I told him that if he was going to do nothing about it if we found something, then there would be no use to doing any neuroimaging.  He wants to go ahead and proceed with neuroimaging.  We will do an MRI of the brain and cervical spine.  He does have low back pain, but we decided to hold off on that given that he is hyperreflexic in the arms.  3.  Dysphagia with aspiration  -Per pulm records, pt does have a hx of dysphagia with chronic aspiration/post inflammatory pulm fibrosis but refuses thickening agents.  His last swallow study per the patient was over a year ago, but if he is unwilling to follow recommendations, there is no use repeating it.  -pt requests DNR status which was added to the chart today.  I asked him to give me a copy of his living will.  4.  The patient is new to [place]  and is hoping to get his [place] driving license.  I told the patient that this is really not something I recommend, although he is going through the state licensing process, so will be screened appropriately.  He did very well on his MoCA today.  5.  Follow up is anticipated in the next few months, sooner should new neurologic issues arise.  Much greater than 50% of this visit was spent in counseling and coordinating care.  Total face to face time:  60 min

## 2015-08-26 ENCOUNTER — Ambulatory Visit
Admission: RE | Admit: 2015-08-26 | Discharge: 2015-08-26 | Disposition: A | Discharge status: Home / Self Care | Payer: Medicare Other | Source: Ambulatory Visit | Attending: Neurology | Admitting: Neurology

## 2015-08-26 DIAGNOSIS — G20A1 Parkinson's disease without dyskinesia, without mention of fluctuations: Secondary | ICD-10-CM

## 2015-08-26 DIAGNOSIS — G20 Parkinson's disease: Secondary | ICD-10-CM

## 2015-08-26 DIAGNOSIS — M4802 Spinal stenosis, cervical region: Secondary | ICD-10-CM | POA: Diagnosis not present

## 2015-08-26 DIAGNOSIS — R4781 Slurred speech: Secondary | ICD-10-CM | POA: Diagnosis not present

## 2015-08-26 DIAGNOSIS — R292 Abnormal reflex: Secondary | ICD-10-CM

## 2015-08-26 DIAGNOSIS — R1319 Other dysphagia: Secondary | ICD-10-CM

## 2015-08-27 ENCOUNTER — Telehealth: Payer: Self-pay | Admitting: Neurology

## 2015-08-27 NOTE — Telephone Encounter (Signed)
-----   Message from [place], DO sent at 08/27/2015  7:36 AM EDT ----- Brain with mild to mod WMD; cervical spine with degenerative changes and spinal stenosis.  Bradley Bennett, you can let pt/daughter know but nothing surgical on examination, which is what I was making sure

## 2015-08-27 NOTE — Telephone Encounter (Signed)
Patients daughter made aware

## 2015-08-31 ENCOUNTER — Encounter: Payer: Self-pay | Admitting: Internal Medicine

## 2015-08-31 ENCOUNTER — Ambulatory Visit (INDEPENDENT_AMBULATORY_CARE_PROVIDER_SITE_OTHER): Payer: Medicare Other | Admitting: Internal Medicine

## 2015-08-31 VITALS — Wt 187.0 lb

## 2015-08-31 DIAGNOSIS — H9193 Unspecified hearing loss, bilateral: Secondary | ICD-10-CM | POA: Insufficient documentation

## 2015-08-31 DIAGNOSIS — I2510 Atherosclerotic heart disease of native coronary artery without angina pectoris: Secondary | ICD-10-CM

## 2015-08-31 DIAGNOSIS — H6123 Impacted cerumen, bilateral: Secondary | ICD-10-CM

## 2015-08-31 DIAGNOSIS — H6120 Impacted cerumen, unspecified ear: Secondary | ICD-10-CM | POA: Insufficient documentation

## 2015-08-31 NOTE — Patient Instructions (Signed)
Start using over the counter ear wax removal drops, such as debrox.

## 2015-08-31 NOTE — Progress Notes (Signed)
Pre visit review using our clinic review tool, if applicable. No additional management support is needed unless otherwise documented below in the visit note. 

## 2015-08-31 NOTE — Progress Notes (Signed)
Subjective:    Patient ID: Bradley Bennett, male    DOB: 09-07-1932, 82 y.o.   MRN: [id number]  HPI He is here today for decreased hearing.   He had excessive wax in the ear canals.  His hearing has been decreased and he followed up with his hearing aid provider who felt it may be cerumen.  He denies pain in his ears, fever, sore throat, PND.  He does have increased nasal congestion and is taking allergy medication.     Medications and allergies reviewed with patient and updated if appropriate.  Patient Active Problem List   Diagnosis Date Noted  . COPD (chronic obstructive pulmonary disease) ([place]) 07/16/2015  . Hypoxemia 07/16/2015  . Postinflammatory pulmonary fibrosis ([place]) 07/16/2015  . Parkinson disease ([place]) 07/16/2015  . Hypothyroidism 07/16/2015  . Insomnia 07/16/2015  . BPH (benign prostatic hypertrophy) 07/16/2015  . Anxiety 07/16/2015  . Carpal tunnel syndrome, right 07/16/2015  . Chronic lower back pain 07/16/2015  . Hyperglycemia 07/16/2015  . Macular degeneration 07/16/2015  . CAD in native artery 06/15/2015  . 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 S/P coronary artery stent placement 06/15/2015  . Hyperlipidemia 06/15/2015    Current Outpatient Prescriptions on File Prior to Visit  Medication Sig Dispense Refill  . acetaminophen (TYLENOL) 500 MG tablet Take 500 mg by mouth as needed.    . ALPRAZolam (XANAX) 0.25 MG tablet Take 0.25 mg by mouth at bedtime as needed for anxiety.    Marland Kitchen atorvastatin (LIPITOR) 10 MG tablet Take 10 mg by mouth daily.    . busPIRone (BUSPAR) 5 MG tablet Take 1 tablet (5 mg total) by mouth daily. 90 tablet 1  . carbidopa-levodopa (SINEMET CR) 50-200 MG tablet Take 1 tablet by mouth at bedtime. 90 tablet 1  . carbidopa-levodopa (SINEMET IR) 25-100 MG tablet Take 1.5 tablets by mouth 3 (three) times daily.    . carbidopa-levodopa (SINEMET IR) 25-100 MG tablet Take 2 tablets by mouth 3 (three) times daily. 540 tablet 1  . clopidogrel (PLAVIX) 75  MG tablet Take 75 mg by mouth daily.    . finasteride (PROSCAR) 5 MG tablet Take 1 tablet (5 mg total) by mouth daily. 90 tablet 1  . Fluticasone-Salmeterol (ADVAIR DISKUS) 500-50 MCG/DOSE AEPB Inhale 1 puff into the lungs 2 (two) times daily.    Marland Kitchen levothyroxine (SYNTHROID, LEVOTHROID) 150 MCG tablet Take 1 tablet (150 mcg total) by mouth daily. 90 tablet 3  . Multiple Vitamins-Minerals (CENTRUM VITAMINTS PO) Take 1 tablet by mouth daily.     . tamsulosin (FLOMAX) 0.4 MG CAPS capsule Take 0.4 mg by mouth daily.     . traZODone (DESYREL) 50 MG tablet Take 1 tablet (50 mg total) by mouth at bedtime. 90 tablet 1  . umeclidinium bromide (INCRUSE ELLIPTA) 62.5 MCG/INH AEPB Inhale 1 puff into the lungs daily.     No current facility-administered medications on file prior to visit.     Past Medical History:  Diagnosis Date  . Arthritis   . Asthma   . Emphysema of lung ([place])   . Hearing difficulty of both ears   . Heart attack ([place])   . Heart murmur   . Hyperlipidemia   . Hypertension   . ILD (interstitial lung disease) ([place])   . Parkinson disease [facility])     Past Surgical History:  Procedure Laterality Date  . CARPAL TUNNEL RELEASE Right 03/2014  . CORONARY ANGIOPLASTY WITH STENT PLACEMENT    . HERNIA REPAIR  10/2013   x3   . VEIN BYPASS SURGERY      Social History   Social History  . Marital status: Divorced    Spouse name: N/A  . Number of children: N/A  . Years of education: N/A   Occupational History  . retired     [place] History Main Topics  . Smoking status: Former Smoker    Packs/day: 2.00    Years: 35.00    Quit date: 08/15/1985  . Smokeless tobacco: Never Used     Comment: quit smoking in 1992  . Alcohol use 0.0 oz/week     Comment: twice every 6 months  . Drug use: No  . Sexual activity: Not on file   Other Topics Concern  . Not on file   Social History Narrative  . No narrative on file    Family History  Problem Relation Age of Onset  .  Arthritis Mother   . Colon cancer Father   . Stroke Father   . Arthritis Sister   . Heart disease Brother   . Kidney cancer Brother     Review of Systems  Constitutional: Negative for fever.  HENT: Positive for congestion and hearing loss. Negative for ear pain, postnasal drip and sore throat.   Respiratory: Positive for cough (a little) and shortness of breath (chronic and unchanged). Negative for chest tightness and wheezing.        Objective:   Vitals:   08/31/15 1609  BP: (P) 138/88  Pulse: (P) 91  Resp: (P) 18  Temp: (P) 98.5 F (36.9 C)   Filed Weights   08/31/15 1609  Weight: 187 lb (84.8 kg)   Body mass index is 26.45 kg/m.   Physical Exam  Constitutional: He appears well-developed and well-nourished. No distress.  HENT:  Head: Normocephalic and atraumatic.  Right Ear: External ear normal.  Left Ear: External ear normal.  Mouth/Throat: Oropharynx is clear and moist.  Left ear canal had excessive cerumen which was cleaned out completely by ear lavage by the CMA; right ear canal had excessive cerumen and most was removed but residual cerumen resided in his ear canal along his TM, there was mild erythema in the ear canal.  Eyes: Conjunctivae are normal.  Neck: Neck supple. No tracheal deviation present. No thyromegaly present.  Lymphadenopathy:    He has no cervical adenopathy.  Skin: Skin is warm and dry. He is not diaphoretic.          Assessment & Plan:   Decreased hearing, acute on chronic  Secondary to impacted ear wax Most of his ear wax was successfully removed, except some residual ear wax in right ear Will try otc ear wax removal drops Difficulty to say if hearing has improved - poor to begin with  Continue allergy medications, saline nasal spray, mucinex and can try flonase for chronic nasal congestion  Follow up as needed

## 2015-09-03 DIAGNOSIS — R351 Nocturia: Secondary | ICD-10-CM | POA: Diagnosis not present

## 2015-09-03 DIAGNOSIS — N401 Enlarged prostate with lower urinary tract symptoms: Secondary | ICD-10-CM | POA: Diagnosis not present

## 2015-10-08 DIAGNOSIS — N3281 Overactive bladder: Secondary | ICD-10-CM | POA: Diagnosis not present

## 2015-10-08 DIAGNOSIS — N401 Enlarged prostate with lower urinary tract symptoms: Secondary | ICD-10-CM | POA: Diagnosis not present

## 2015-10-12 ENCOUNTER — Telehealth: Payer: Self-pay | Admitting: Pulmonary Disease

## 2015-10-12 DIAGNOSIS — J8410 Pulmonary fibrosis, unspecified: Secondary | ICD-10-CM

## 2015-10-12 NOTE — Telephone Encounter (Signed)
LM X1  Will await call back.

## 2015-10-12 NOTE — Telephone Encounter (Signed)
Pt states he is interested in pulmonary rehab as he would like to be a part of a local program. I explained to pt that pulmonary rehab is a exercise program that works with patients that has pulmonary issues such as COPD.   DS please advise if you think this is a good option for this pt. Thanks.

## 2015-10-12 NOTE — Telephone Encounter (Signed)
Patient is interested in a Pulmonary Rehab Program and wanted to get some information from Dr. Alva Garnet office. He would love to be a part of a local program if possible.

## 2015-10-14 NOTE — Telephone Encounter (Signed)
LMOM for daughter Margarita Grizzle to call back in regards to rehab.

## 2015-10-14 NOTE — Telephone Encounter (Signed)
I support his desire to participate in Rehab. Please arrange  Bradley Bennett

## 2015-10-15 NOTE — Telephone Encounter (Signed)
Spoke with daughter Margarita Grizzle and she states pt wants to do [employer] at [facility]. Will place order. Nothing further needed.

## 2015-10-25 ENCOUNTER — Ambulatory Visit (HOSPITAL_COMMUNITY): Payer: Medicare Other

## 2015-10-25 ENCOUNTER — Telehealth (HOSPITAL_COMMUNITY): Payer: Self-pay | Admitting: *Deleted

## 2015-11-08 ENCOUNTER — Encounter (HOSPITAL_COMMUNITY)
Admission: RE | Admit: 2015-11-08 | Discharge: 2015-11-08 | Disposition: A | Discharge status: Home / Self Care | Payer: Medicare Other | Source: Ambulatory Visit | Attending: Pulmonary Disease | Admitting: Pulmonary Disease

## 2015-11-08 ENCOUNTER — Encounter (HOSPITAL_COMMUNITY): Payer: Self-pay

## 2015-11-08 ENCOUNTER — Telehealth (HOSPITAL_COMMUNITY): Payer: Self-pay | Admitting: *Deleted

## 2015-11-08 VITALS — BP 119/75 | HR 86 | Ht 71.0 in | Wt 181.7 lb

## 2015-11-08 DIAGNOSIS — J8410 Pulmonary fibrosis, unspecified: Secondary | ICD-10-CM | POA: Insufficient documentation

## 2015-11-08 HISTORY — DX: Atherosclerotic heart disease of native coronary artery without angina pectoris: I25.10

## 2015-11-08 NOTE — Progress Notes (Signed)
Bradley Bennett 82 y.o. male Pulmonary Rehab Orientation Note Patient arrived today in Cardiac and Pulmonary Rehab for orientation to Pulmonary Rehab. He was transported from [employer] via wheel chair. He does carry portable oxygen. Per pt, he uses oxygen continuously. Color good, skin warm and dry. Patient is oriented to time and place. Patient's medical history, psychosocial health, and medications reviewed. Psychosocial assessment reveals pt lives alone, but in a retirement facility that provides his meals and apartment cleaning services. Pt is currently retired as a  Insurance account manager. Pt hobbies include chess on the computer that he plays daily.  He used to square dance and enjoy photography. Pt reports his stress level is low. He states he has no stress in his life.  Pt does not exhibit signs of depression. PHQ2/9 score 0/0. Pt shows good  coping skills with positive outlook. Physical assessment reveals heart rate is normal, breath sounds clear to auscultation, no wheezes, rales, or rhonchi. Grip strength equal, strong. Distal pulses 3+ bilateral posterior tibial pulses present. Patient reports he does take medications as prescribed. Patient states he follows a Regular diet. The patient reports no specific efforts to gain or lose weight.. Patient's weight will be monitored closely. Demonstration and practice of PLB using pulse oximeter. Patient able to return demonstration satisfactorily. Safety and hand hygiene in the exercise area reviewed with patient. Patient voices understanding of the information reviewed. Department expectations discussed with patient and achievable goals were set. The patient shows enthusiasm about attending the program and we look forward to working with this nice gentleman. The patient is scheduled for a 6 min walk test on Tuesday, November 09, 2015 @ 3:45 pm and to begin exercise on Tuesday, November 16, 2015 in the 1030 class.  [id number]

## 2015-11-09 ENCOUNTER — Encounter (HOSPITAL_COMMUNITY)
Admission: RE | Admit: 2015-11-09 | Discharge: 2015-11-09 | Disposition: A | Discharge status: Home / Self Care | Payer: Medicare Other | Source: Ambulatory Visit | Attending: Pulmonary Disease | Admitting: Pulmonary Disease

## 2015-11-09 DIAGNOSIS — J8410 Pulmonary fibrosis, unspecified: Secondary | ICD-10-CM

## 2015-11-09 NOTE — Progress Notes (Signed)
Bradley Bennett was seen today in the movement disorders clinic for neurologic consultation at the request of Bradley Rail, MD.  The consultation is for the evaluation of PD.  This patient is accompanied in the office by his child who supplements the history.  Pt just moved here from [place].  I don't have any previous neurology records.  Pt reports that he was dx with PD about 4 years ago per pt and about 10 years ago per daughter.  His first sx was L hand tremor.  Daughter states he was on something else prior to the levodopa but she isn't sure that it was specifically for PD.  He is currently on carbidopa/levodopa 25/100, 1.5 tablets tid (8:30am/5:30/9pm).  Daughter thinks that the med wears off   11/16/15 update: The patient follows up today, accompanied by his daughter who supplements the history.  Last visit, we talked about changing his dosing of his medication, so that he takes carbidopa/levodopa 25/100, 2 tablets at 8 AM/noon/4 PM and then we added carbidopa/levodopa 50/200 at bedtime.  Daughter thinks that it helps.  He had an MRI of the brain and cervical spine since our last visit.  The MRI of the brain demonstrated mild to moderate white matter disease.  The MRI of the cervical spine demonstrated degenerative changes and spinal stenosis, but nothing surgical noted.  He has not had any falls since last visit.  No hallucinations.  No lightheadedness or near syncope.  He is using melatonin, 10 mg, for sleep and trazodone for sleep.  Doing pulm rehab on tues/thurs but daughter states that more active on the other days.  He is doing Wii bowling.  No hallucinations.  He lives in independent assisted living but he does his own medication.  His daughter prepares the pill box and he has no trouble remembering to take the medication.    PREVIOUS MEDICATIONS: Sinemet  ALLERGIES:   Allergies  Allergen Reactions  . Isosorbide     CURRENT MEDICATIONS:  Outpatient Encounter Prescriptions as of  11/16/2015  Medication Sig  . acetaminophen (TYLENOL) 500 MG tablet Take 500 mg by mouth as needed.  . ALPRAZolam (XANAX) 0.25 MG tablet Take 0.25 mg by mouth at bedtime as needed for anxiety.  Marland Kitchen atorvastatin (LIPITOR) 10 MG tablet Take 10 mg by mouth daily.  . busPIRone (BUSPAR) 5 MG tablet Take 1 tablet (5 mg total) by mouth daily.  . carbidopa-levodopa (SINEMET CR) 50-200 MG tablet Take 1 tablet by mouth at bedtime.  . carbidopa-levodopa (SINEMET IR) 25-100 MG tablet Take 1.5 tablets by mouth 3 (three) times daily.  . carbidopa-levodopa (SINEMET IR) 25-100 MG tablet Take 2 tablets by mouth 3 (three) times daily.  . clopidogrel (PLAVIX) 75 MG tablet Take 75 mg by mouth daily.  . finasteride (PROSCAR) 5 MG tablet Take 1 tablet (5 mg total) by mouth daily.  . Fluticasone-Salmeterol (ADVAIR DISKUS) 500-50 MCG/DOSE AEPB Inhale 1 puff into the lungs 2 (two) times daily.  Marland Kitchen levothyroxine (SYNTHROID, LEVOTHROID) 150 MCG tablet Take 1 tablet (150 mcg total) by mouth daily.  . Multiple Vitamins-Minerals (CENTRUM VITAMINTS PO) Take 1 tablet by mouth daily.   . tamsulosin (FLOMAX) 0.4 MG CAPS capsule Take 0.4 mg by mouth daily.   . traZODone (DESYREL) 50 MG tablet Take 1 tablet (50 mg total) by mouth at bedtime.  Marland Kitchen umeclidinium bromide (INCRUSE ELLIPTA) 62.5 MCG/INH AEPB Inhale 1 puff into the lungs daily.   No facility-administered encounter medications on file as of  11/16/2015.     PAST MEDICAL HISTORY:   Past Medical History:  Diagnosis Date  . Arthritis   . Asthma   . Coronary artery disease   . Emphysema of lung ([place])   . Hearing difficulty of both ears   . Heart attack   . Heart murmur   . Hyperlipidemia   . Hypertension   . ILD (interstitial lung disease) ([place])   . Parkinson disease ([place])     PAST SURGICAL HISTORY:   Past Surgical History:  Procedure Laterality Date  . CARPAL TUNNEL RELEASE Right 03/2014  . CORONARY ANGIOPLASTY WITH STENT PLACEMENT    . CORONARY ARTERY BYPASS  GRAFT    . HERNIA REPAIR  10/2013   x3   . VEIN BYPASS SURGERY      SOCIAL HISTORY:   Social History   Social History  . Marital status: Divorced    Spouse name: N/A  . Number of children: N/A  . Years of education: N/A   Occupational History  . retired     [place] History Main Topics  . Smoking status: Former Smoker    Packs/day: 2.00    Years: 35.00    Quit date: 08/15/1985  . Smokeless tobacco: Never Used     Comment: quit smoking in 1992  . Alcohol use 0.0 oz/week     Comment: twice every 6 months  . Drug use: No  . Sexual activity: Not on file   Other Topics Concern  . Not on file   Social History Narrative  . No narrative on file    FAMILY HISTORY:   Family Status  Relation Status  . Father Deceased   stroke, colon cancer  . Mother Deceased   arthritis, ruptured spleen  . Brother Deceased   MI, DM, kidney cancer  . Sister Alive   lupus  . Sister Alive   heart disease, DM  . Daughter Alive   2, healthy  . Son Alive   1, healthy  . Sister   . Brother     ROS:  A complete 10 system review of systems was obtained and was unremarkable apart from what is mentioned above.  PHYSICAL EXAMINATION:    VITALS:   There were no vitals filed for this visit.  GEN:  The patient appears stated age and is in NAD. HEENT:  Normocephalic, atraumatic.  The mucous membranes are moist. The superficial temporal arteries are without ropiness or tenderness. CV:  RRR Lungs:  CTAB.  He is wearing O2 Neck/HEME:  There are no carotid bruits bilaterally.  Neurological examination:  Orientation:  Montreal Cognitive Assessment  08/16/2015  Visuospatial/ Executive (0/5) 5  Naming (0/3) 3  Attention: Read list of digits (0/2) 2  Attention: Read list of letters (0/1) 1  Attention: Serial 7 subtraction starting at 100 (0/3) 3  Language: Repeat phrase (0/2) 2  Language : Fluency (0/1) 1  Abstraction (0/2) 2  Delayed Recall (0/5) 2  Orientation (0/6) 6  Total 27    Adjusted Score (based on education) 27   Cranial nerves: There is good facial symmetry. Pupils are equal round and reactive to light bilaterally. Fundoscopic exam reveals clear margins bilaterally. Extraocular muscles are intact. The visual fields are full to confrontational testing. The speech is fluent and clear. Soft palate rises symmetrically and there is no tongue deviation. Hearing is decreased to conversational tone. Sensation: Sensation is intact to light and pinprick throughout (facial, trunk, extremities). Vibration is intact at  the bilateral big toe. There is no extinction with double simultaneous stimulation. There is no sensory dermatomal level identified. Motor: Strength is 5/5 in the bilateral upper and lower extremities.   Shoulder shrug is equal and symmetric.  There is no pronator drift. Deep tendon reflexes: Deep tendon reflexes are 2+-3/4 at the bilateral biceps, triceps, brachioradialis, 3+patella and achilles. Plantar responses are downgoing bilaterally.  Movement examination: Tone: There is no increased tone in the UE's.     The tone in the lower extremities is normal.  Abnormal movements: There is LUE resting tremor  Coordination:  There is decremation with RAM's, with any form of RAMS, including alternating supination and pronation of the forearm, hand opening and closing, finger taps, heel taps and toe taps on the L.  He is better on the right Gait and Station: The patient has minimal difficulty arising out of a deep-seated chair without the use of the hands. The patient's stride length is good with reemergent tremor on the left.    ASSESSMENT/PLAN:  1.  idiopathic Parkinson's disease.  The patient has tremor, bradykinesia, rigidity and Minimal postural instability.  -We will continue carbidopa/levodopa 25/100, 2 tablets at 8 AM/noon/4 PM.  Looks better with that increase.  -He will continue carbidopa/levodopa 50/200 at bedtime.  -He really cannot exercise because of  pulmonary disease, but encouraged him to do physical therapy every 6 months.    2.  Hyperreflexia  -MRI of the brain just demonstrated mild to moderate white matter disease and MRI the cervical spine demonstrates degenerative changes, but no cervical surgical lesions.  3.  Dysphagia with aspiration  -Per pulm records, pt does have a hx of dysphagia with chronic aspiration/post inflammatory pulm fibrosis but refuses thickening agents.  His last swallow study per the patient was over a year ago, but if he is unwilling to follow recommendations, there is no use repeating it.  -pt requests DNR status which was added to the chart today.  I asked him to give me a copy of his living will.  4.   Follow up is anticipated in the next few months, sooner should new neurologic issues arise.  Much greater than 50% of this visit was spent in counseling and coordinating care.  Total face to face time:  60 min

## 2015-11-11 ENCOUNTER — Encounter (HOSPITAL_COMMUNITY)
Admission: RE | Admit: 2015-11-11 | Discharge: 2015-11-11 | Disposition: A | Discharge status: Home / Self Care | Payer: Medicare Other | Source: Ambulatory Visit | Attending: Pulmonary Disease | Admitting: Pulmonary Disease

## 2015-11-11 VITALS — Wt 183.2 lb

## 2015-11-11 DIAGNOSIS — J8410 Pulmonary fibrosis, unspecified: Secondary | ICD-10-CM | POA: Diagnosis not present

## 2015-11-11 NOTE — Progress Notes (Signed)
Daily Session Note  Patient Details  Name: Bradley Bennett MRN: [id number] Date of Birth: Mar 01, 1932 Referring Provider:   [name] Pulmonary Rehab Walk Test from 11/09/2015 in [place]  Referring Provider  Dr. Alva Garnet      Encounter Date: 11/11/2015  Check In:     Session Check In - 11/11/15 1033      Check-In   Location [facility]   Staff Present Su Hilt, MS, ACSM RCEP, Exercise Physiologist;Joan Leonia Reeves, RN, BSN;Takoda Janowiak, RN;Portia Rollene Rotunda, RN, BSN   Supervising physician immediately available to respond to emergencies [facility] immediately available   Physician(s) Dr. [place] Pink   Medication changes reported     No   Fall or balance concerns reported    No   Warm-up and Cool-down Performed as group-led instruction   Resistance Training Performed Yes   VAD Patient? No     Pain Assessment   Currently in Pain? No/denies   Multiple Pain Sites No      Capillary Blood Glucose: No results found for this or any previous visit (from the past 24 hour(s)).      Exercise Prescription Changes - 11/11/15 1200      Response to Exercise   Blood Pressure (Admit) 112/72   Blood Pressure (Exercise) 150/80   Blood Pressure (Exit) 100/60   Heart Rate (Admit) 77 bpm   Heart Rate (Exercise) 114 bpm   Heart Rate (Exit) 88 bpm   Oxygen Saturation (Admit) 98 %   Oxygen Saturation (Exercise) 86 %   Oxygen Saturation (Exit) 99 %   Rating of Perceived Exertion (Exercise) 12   Perceived Dyspnea (Exercise) 1   Duration Progress to 45 minutes of aerobic exercise without signs/symptoms of physical distress   Intensity --  40-80% of THRR     Progression   Progression Continue to progress workloads to maintain intensity without signs/symptoms of physical distress.     Resistance Training   Training Prescription Yes   Weight green bands   Reps --  10 minutes of strength training     Oxygen   Oxygen Continuous   Liters 8     NuStep   Level 2   Minutes 17     Track   Laps 8   Minutes 17     Goals Met:  Exercise tolerated well No report of cardiac concerns or symptoms Strength training completed today  Goals Unmet:  Not Applicable  Comments: Service time is from 1030 to 1220    Dr. Rush Farmer is Medical Director for Pulmonary Rehab at [facility].

## 2015-11-11 NOTE — Progress Notes (Signed)
Pulmonary Individual Treatment Plan  Patient Details  Name: Bradley Bennett MRN: [id number] Date of Birth: 17-Jun-1932 Referring Provider:   [name] Pulmonary Rehab Walk Test from 11/09/2015 in [place]  Referring Provider  Dr. Alva Garnet      Initial Encounter Date:  Flowsheet Row Pulmonary Rehab Walk Test from 11/09/2015 in [place]  Date  11/11/15  Referring Provider  Dr. 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  acetaminophen (TYLENOL) 500 MG tablet, Take 500 mg by mouth as needed., Disp: , Rfl:  .  ALPRAZolam (XANAX) 0.25 MG tablet, Take 0.25 mg by mouth at bedtime as needed for anxiety., Disp: , Rfl:  .  atorvastatin (LIPITOR) 10 MG tablet, Take 10 mg by mouth daily., Disp: , Rfl:  .  busPIRone (BUSPAR) 5 MG tablet, Take 1 tablet (5 mg total) by mouth daily., Disp: 90 tablet, Rfl: 1 .  carbidopa-levodopa (SINEMET CR) 50-200 MG tablet, Take 1 tablet by mouth at bedtime., Disp: 90 tablet, Rfl: 1 .  carbidopa-levodopa (SINEMET IR) 25-100 MG tablet, Take 1.5 tablets by mouth 3 (three) times daily., Disp: , Rfl:  .  carbidopa-levodopa (SINEMET IR) 25-100 MG tablet, Take 2 tablets by mouth 3 (three) times daily., Disp: 540 tablet, Rfl: 1 .  clopidogrel (PLAVIX) 75 MG tablet, Take 75 mg by mouth daily., Disp: , Rfl:  .  finasteride (PROSCAR) 5 MG tablet, Take 1 tablet (5 mg total) by mouth daily., Disp: 90 tablet, Rfl: 1 .  Fluticasone-Salmeterol (ADVAIR DISKUS) 500-50 MCG/DOSE AEPB, Inhale 1 puff into the lungs 2 (two) times daily., Disp: , Rfl:  .  levothyroxine (SYNTHROID, LEVOTHROID) 150 MCG tablet, Take 1 tablet (150 mcg total) by mouth daily., Disp: 90 tablet, Rfl: 3 .  Multiple Vitamins-Minerals (CENTRUM VITAMINTS PO), Take 1 tablet by mouth daily. , Disp: , Rfl:  .  tamsulosin (FLOMAX) 0.4 MG CAPS  capsule, Take 0.4 mg by mouth daily. , Disp: , Rfl:  .  traZODone (DESYREL) 50 MG tablet, Take 1 tablet (50 mg total) by mouth at bedtime., Disp: 90 tablet, Rfl: 1 .  umeclidinium bromide (INCRUSE ELLIPTA) 62.5 MCG/INH AEPB, Inhale 1 puff into the lungs daily., Disp: , Rfl:   Past Medical History: Past Medical History:  Diagnosis Date  . Arthritis   . Asthma   . Coronary artery disease   . Emphysema of lung ([place])   . Hearing difficulty of both ears   . Heart attack   . Heart murmur   . Hyperlipidemia   . Hypertension   . ILD (interstitial lung disease) ([place])   . Parkinson disease ([place])     Tobacco Use: History  Smoking Status  . Former Smoker  . Packs/day: 2.00  . Years: 35.00  . Quit date: 08/15/1985  Smokeless Tobacco  . Never Used    Comment: quit smoking in 1992    Labs: Recent Review Flowsheet Data    Labs for ITP Cardiac and Pulmonary Rehab Latest Ref Rng & Units 07/23/2015   Cholestrol 0 - 200 mg/dL 172   LDLCALC 0 - 99 mg/dL 92   HDL >39.00 mg/dL 57.60   Trlycerides 0.0 - 149.0 mg/dL 108.0   Hemoglobin A1c 4.6 - 6.5 % 5.7      Capillary Blood Glucose: No results found for: GLUCAP   ADL UCSD:   Pulmonary Function Assessment:  Pulmonary Function Assessment - 11/08/15 1100      Breath   Bilateral Breath Sounds Clear   Shortness of Breath Yes      Exercise Target Goals: Date: 11/11/15  Exercise Program Goal: Individual exercise prescription set with THRR, safety & activity barriers. Participant demonstrates ability to understand and report RPE using BORG scale, to self-measure pulse accurately, and to acknowledge the importance of the exercise prescription.  Exercise Prescription Goal: Starting with aerobic activity 30 plus minutes a day, 3 days per week for initial exercise prescription. Provide home exercise prescription and guidelines that participant acknowledges understanding prior to discharge.  Activity Barriers & Risk  Stratification:     Activity Barriers & Cardiac Risk Stratification - 11/08/15 1059      Activity Barriers & Cardiac Risk Stratification   Activity Barriers Arthritis      6 Minute Walk:     6 Minute Walk    Row Name 11/11/15 0656         6 Minute Walk   Phase Initial     Distance 825 feet     Walk Time 4.55 minutes  4 minutes and 55 seconds     # of Rest Breaks 2  first rest break: 45 seconds Second Break: 20 seconds     MPH 1.56     METS 2.23     RPE 11     Perceived Dyspnea  1     Symptoms No     Resting HR 101 bpm     Resting BP 100/70     Max Ex. HR 122 bpm     Max Ex. BP 102/74       Interval HR   Baseline HR 101     1 Minute HR 102     2 Minute HR 103     3 Minute HR 115     4 Minute HR 107     5 Minute HR 122     6 Minute HR 120     2 Minute Post HR 113     Interval Heart Rate? Yes       Interval Oxygen   Interval Oxygen? Yes     Baseline Oxygen Saturation % 93 %     Baseline Liters of Oxygen 4 L     1 Minute Oxygen Saturation % 88 %     1 Minute Liters of Oxygen 4 L     2 Minute Oxygen Saturation % 81 %     2 Minute Liters of Oxygen 4 L     3 Minute Oxygen Saturation % 87 %     3 Minute Liters of Oxygen 8 L     4 Minute Oxygen Saturation % 81 %     4 Minute Liters of Oxygen 8 L     5 Minute Oxygen Saturation % 85 %     5 Minute Liters of Oxygen 8 L     6 Minute Oxygen Saturation % 91 %     6 Minute Liters of Oxygen 8 L        Initial Exercise Prescription:     Initial Exercise Prescription - 11/11/15 0600      Date of Initial Exercise RX and Referring Provider   Date 11/11/15   Referring Provider Dr. Alva Garnet     Oxygen   Oxygen Continuous   Liters 6-8     NuStep   Level 2   Minutes 17   METs 1.5  Arm Ergometer   Level 2   Minutes 17     Track   Laps 4   Minutes 17     Prescription Details   Frequency (times per week) 2   Duration Progress to 45 minutes of aerobic exercise without signs/symptoms of physical  distress     Intensity   THRR 40-80% of Max Heartrate 56-111   Ratings of Perceived Exertion 11-13   Perceived Dyspnea 0-4     Progression   Progression Continue progressive overload as per policy without signs/symptoms or physical distress.     Resistance Training   Training Prescription Yes   Weight GREEN BANDS   Reps 10-12      Perform Capillary Blood Glucose checks as needed.  Exercise Prescription Changes:   Exercise Comments:   Discharge Exercise Prescription (Final Exercise Prescription Changes):    Nutrition:  Target Goals: Understanding of nutrition guidelines, daily intake of sodium 1500mg , cholesterol 200mg , calories 30% from fat and 7% or less from saturated fats, daily to have 5 or more servings of fruits and vegetables.  Biometrics:     Pre Biometrics - 11/08/15 1102      Pre Biometrics   Grip Strength 30 kg       Nutrition Therapy Plan and Nutrition Goals:   Nutrition Discharge: Rate Your Plate Scores:   Psychosocial: Target Goals: Acknowledge presence or absence of depression, maximize coping skills, provide positive support system. Participant is able to verbalize types and ability to use techniques and skills needed for reducing stress and depression.  Initial Review & Psychosocial Screening:     Initial Psych Review & Screening - 11/08/15 [place]? Yes  daughters live in [place]     Barriers   Psychosocial barriers to participate in program There are no identifiable barriers or psychosocial needs.     Screening Interventions   Interventions Encouraged to exercise      Quality of Life Scores:   PHQ-9: Recent Review Flowsheet Data    Depression screen [facility] 2/9 11/08/2015   Decreased Interest 0   Down, Depressed, Hopeless 0   PHQ - 2 Score 0      Psychosocial Evaluation and Intervention:     Psychosocial Evaluation - 11/08/15 1106      Psychosocial Evaluation  & Interventions   Continued Psychosocial Services Needed --  none have been needed in the past, none needed now      Psychosocial Re-Evaluation:  Education: Education Goals: Education classes will be provided on a weekly basis, covering required topics. Participant will state understanding/return demonstration of topics presented.  Learning Barriers/Preferences:     Learning Barriers/Preferences - 11/08/15 1059      Learning Barriers/Preferences   Learning Barriers None   Learning Preferences Audio;Group Instruction;Individual Instruction;Skilled Demonstration;Verbal Instruction      Education Topics: Risk Factor Reduction:  -Group instruction that is supported by a PowerPoint presentation. Instructor discusses the definition of a risk factor, different risk factors for pulmonary disease, and how the heart and lungs work together.     Nutrition for Pulmonary Patient:  -Group instruction provided by PowerPoint slides, verbal discussion, and written materials to support subject matter. The instructor gives an explanation and review of healthy diet recommendations, which includes a discussion on weight management, recommendations for fruit and vegetable consumption, as well as protein, fluid, caffeine, fiber, sodium, sugar, and alcohol. Tips for eating when patients are short of breath are  discussed.   Pursed Lip Breathing:  -Group instruction that is supported by demonstration and informational handouts. Instructor discusses the benefits of pursed lip and diaphragmatic breathing and detailed demonstration on how to preform both.     Oxygen Safety:  -Group instruction provided by PowerPoint, verbal discussion, and written material to support subject matter. There is an overview of "What is Oxygen" and "Why do we need it".  Instructor also reviews how to create a safe environment for oxygen use, the importance of using oxygen as prescribed, and the risks of noncompliance. There is a  brief discussion on traveling with oxygen and resources the patient may utilize.   Oxygen Equipment:  -Group instruction provided by [facility] Staff utilizing handouts, written materials, and equipment demonstrations.   Signs and Symptoms:  -Group instruction provided by written material and verbal discussion to support subject matter. Warning signs and symptoms of infection, stroke, and heart attack are reviewed and when to call the physician/911 reinforced. Tips for preventing the spread of infection discussed.   Advanced Directives:  -Group instruction provided by verbal instruction and written material to support subject matter. Instructor reviews Advanced Directive laws and proper instruction for filling out document.   Pulmonary Video:  -Group video education that reviews the importance of medication and oxygen compliance, exercise, good nutrition, pulmonary hygiene, and pursed lip and diaphragmatic breathing for the pulmonary patient.   Exercise for the Pulmonary Patient:  -Group instruction that is supported by a PowerPoint presentation. Instructor discusses benefits of exercise, core components of exercise, frequency, duration, and intensity of an exercise routine, importance of utilizing pulse oximetry during exercise, safety while exercising, and options of places to exercise outside of rehab.     Pulmonary Medications:  -Verbally interactive group education provided by instructor with focus on inhaled medications and proper administration.   Anatomy and Physiology of the Respiratory System and Intimacy:  -Group instruction provided by PowerPoint, verbal discussion, and written material to support subject matter. Instructor reviews respiratory cycle and anatomical components of the respiratory system and their functions. Instructor also reviews differences in obstructive and restrictive respiratory diseases with examples of each. Intimacy, Sex, and Sexuality differences are  reviewed with a discussion on how relationships can change when diagnosed with pulmonary disease. Common sexual concerns are reviewed.   Knowledge Questionnaire Score:   Core Components/Risk Factors/Patient Goals at Admission:     Personal Goals and Risk Factors at Admission - 11/08/15 1102      Core Components/Risk Factors/Patient Goals on Admission   Increase Strength and Stamina Yes   Intervention Provide advice, education, support and counseling about physical activity/exercise needs.;Develop an individualized exercise prescription for aerobic and resistive training based on initial evaluation findings, risk stratification, comorbidities and participant's personal goals.   Expected Outcomes Achievement of increased cardiorespiratory fitness and enhanced flexibility, muscular endurance and strength shown through measurements of functional capacity and personal statement of participant.   Improve shortness of breath with ADL's Yes   Intervention Provide education, individualized exercise plan and daily activity instruction to help decrease symptoms of SOB with activities of daily living.   Expected Outcomes Short Term: Achieves a reduction of symptoms when performing activities of daily living.   Develop more efficient breathing techniques such as purse lipped breathing and diaphragmatic breathing; and practicing self-pacing with activity Yes   Intervention Provide education, demonstration and support about specific breathing techniuqes utilized for more efficient breathing. Include techniques such as pursed lipped breathing, diaphragmatic breathing and self-pacing activity.  Expected Outcomes Short Term: Participant will be able to demonstrate and use breathing techniques as needed throughout daily activities.      Core Components/Risk Factors/Patient Goals Review:    Core Components/Risk Factors/Patient Goals at Discharge (Final Review):    ITP Comments:   Comments:

## 2015-11-15 ENCOUNTER — Telehealth: Payer: Self-pay | Admitting: *Deleted

## 2015-11-15 DIAGNOSIS — J449 Chronic obstructive pulmonary disease, unspecified: Secondary | ICD-10-CM

## 2015-11-15 NOTE — Telephone Encounter (Signed)
New order placed per DS to increase pt's O2 w/exertion to 6L and at rest 4L. Order placed. Daughter informed. Nothing further needed.

## 2015-11-15 NOTE — Telephone Encounter (Signed)
-----   Message from Wilhelmina Mcardle, MD sent at 11/12/2015 11:46 AM EDT ----- Regarding: RE: Pulmonary Rehab I'm not sure who referred him to pulmonary rehab. I do not regard him as much of a candidate for rehab since his lung disease is so advanced and his likelihood of obtaining benefit is so limited. Nonetheless, I will forward this to this to my office and we will try to obtain proper oxygen delivery method.  Dannielle Huh,  Please address with is provider. Thanks  Waunita Schooner  ----- Message ----- From: Feliz Beam Divincenzo, RCEP Sent: 11/12/2015   8:14 AM To: Benedetto Goad, RN, Wilhelmina Mcardle, MD Subject: Pulmonary Rehab                                Dr. Alva Garnet,  We have Mr. Jonothan Melian in our Pulmonary Rehab. Mr. Friar completed a 6MWT on 11/09/15. During the walk test I needed to stop him twice due to extreme desaturations. The lowest he dropped was 81% on 4 liters continuous. I gradually increased him to 8 liters continuous which slowly progressed him to 91%.   He came for his first day of rehab on 11/11/15. His first exercise station was walking the track. I placed him on 8 liters continuous and monitored him very closely. The lowest he dropped was 86%. I used a forehead probe just to make sure I was getting an accurate reading.   My worry is that at home he uses a portable oxygen concentrator that only goes up to 5 liters pulsed, which I know is not enough oxygen for exertional activities of daily living or exercising at home. He stated that when he is making the bed he has observed his oxygen saturation drop into the 70's.   I feel that he needs oxygen tanks at home instead of the POC. I also feel that if he is going to be on a high flow, he would be more comfortable using an oxymizer pendant. If you agree, will you please place an order for both?  Thank you, Su Hilt, MS, ACSM RCEP

## 2015-11-16 ENCOUNTER — Ambulatory Visit (INDEPENDENT_AMBULATORY_CARE_PROVIDER_SITE_OTHER): Payer: Medicare Other | Admitting: Neurology

## 2015-11-16 ENCOUNTER — Encounter: Payer: Self-pay | Admitting: Neurology

## 2015-11-16 ENCOUNTER — Encounter (HOSPITAL_COMMUNITY)
Admission: RE | Admit: 2015-11-16 | Discharge: 2015-11-16 | Disposition: A | Discharge status: Home / Self Care | Payer: Medicare Other | Source: Ambulatory Visit | Attending: Pulmonary Disease | Admitting: Pulmonary Disease

## 2015-11-16 VITALS — Wt 184.3 lb

## 2015-11-16 VITALS — BP 104/68 | HR 99 | Ht 71.0 in | Wt 184.0 lb

## 2015-11-16 DIAGNOSIS — I2510 Atherosclerotic heart disease of native coronary artery without angina pectoris: Secondary | ICD-10-CM

## 2015-11-16 DIAGNOSIS — J8410 Pulmonary fibrosis, unspecified: Secondary | ICD-10-CM

## 2015-11-16 DIAGNOSIS — G20 Parkinson's disease: Secondary | ICD-10-CM | POA: Diagnosis not present

## 2015-11-16 MED ORDER — CARBIDOPA-LEVODOPA ER 50-200 MG PO TBCR: 1.0000 | EXTENDED_RELEASE_TABLET | Freq: Every day | ORAL | 1 refills | Status: DC

## 2015-11-16 MED ORDER — CARBIDOPA-LEVODOPA 25-100 MG PO TABS: 2.0000 | ORAL_TABLET | Freq: Three times a day (TID) | ORAL | 1 refills | Status: DC

## 2015-11-16 NOTE — Progress Notes (Signed)
Daily Session Note  Patient Details  Name: Bradley Bennett MRN: [id number] Date of Birth: 12-17-1932 Referring Provider:   [name] Pulmonary Rehab Walk Test from 11/09/2015 in [place]  Referring Provider  Dr. Alva Garnet      Encounter Date: 11/16/2015  Check In:     Session Check In - 11/16/15 1020      Check-In   Location [facility]   Staff Present Rosebud Poles, RN, BSN;Lyndol Vanderheiden, MS, ACSM RCEP, Exercise Physiologist;Lisa Ysidro Evert, RN;Portia Rollene Rotunda, RN, BSN   Supervising physician immediately available to respond to emergencies [facility] immediately available   Physician(s) Dr. Waldron Labs   Medication changes reported     No   Fall or balance concerns reported    No   Warm-up and Cool-down Performed as group-led instruction   Resistance Training Performed Yes   VAD Patient? No     Pain Assessment   Currently in Pain? No/denies   Multiple Pain Sites No      Capillary Blood Glucose: No results found for this or any previous visit (from the past 24 hour(s)).      Exercise Prescription Changes - 11/16/15 1200      Response to Exercise   Blood Pressure (Admit) 114/70   Blood Pressure (Exercise) 130/66   Blood Pressure (Exit) 102/60   Heart Rate (Admit) 93 bpm   Heart Rate (Exercise) 111 bpm   Heart Rate (Exit) 90 bpm   Oxygen Saturation (Admit) 95 %   Oxygen Saturation (Exercise) 82 %   Oxygen Saturation (Exit) 97 %   Rating of Perceived Exertion (Exercise) 13   Perceived Dyspnea (Exercise) 1   Duration Progress to 45 minutes of aerobic exercise without signs/symptoms of physical distress   Intensity --  40-80% of THRR     Progression   Progression Continue to progress workloads to maintain intensity without signs/symptoms of physical distress.     Resistance Training   Training Prescription Yes   Weight green bands   Reps --  10 minutes of strength training     Oxygen   Oxygen Continuous    Liters 8     NuStep   Level 4   Minutes 17   METs 2     Arm Ergometer   Level 2   Minutes 17     Track   Laps 8   Minutes 17     Goals Met:  Exercise tolerated well No report of cardiac concerns or symptoms Strength training completed today  Goals Unmet:  Not Applicable  Comments: Service time is from 10:30am to 12:00pm    Dr. Rush Farmer is Medical Director for Pulmonary Rehab at [facility].

## 2015-11-16 NOTE — Progress Notes (Signed)
Pulmonary Individual Treatment Plan  Patient Details  Name: Bradley Bennett MRN: [id number] Date of Birth: 10/30/1932 Referring Provider:   [name] Pulmonary Rehab Walk Test from 11/09/2015 in [place]  Referring Provider  Dr. Alva Garnet      Initial Encounter Date:  Flowsheet Row Pulmonary Rehab Walk Test from 11/09/2015 in [place]  Date  11/11/15  Referring Provider  Dr. 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  acetaminophen (TYLENOL) 500 MG tablet, Take 500 mg by mouth as needed., Disp: , Rfl:  .  ALPRAZolam (XANAX) 0.25 MG tablet, Take 0.25 mg by mouth at bedtime as needed for anxiety., Disp: , Rfl:  .  atorvastatin (LIPITOR) 10 MG tablet, Take 10 mg by mouth daily., Disp: , Rfl:  .  busPIRone (BUSPAR) 5 MG tablet, Take 1 tablet (5 mg total) by mouth daily., Disp: 90 tablet, Rfl: 1 .  carbidopa-levodopa (SINEMET CR) 50-200 MG tablet, Take 1 tablet by mouth at bedtime., Disp: 90 tablet, Rfl: 1 .  carbidopa-levodopa (SINEMET IR) 25-100 MG tablet, Take 1.5 tablets by mouth 3 (three) times daily., Disp: , Rfl:  .  carbidopa-levodopa (SINEMET IR) 25-100 MG tablet, Take 2 tablets by mouth 3 (three) times daily., Disp: 540 tablet, Rfl: 1 .  clopidogrel (PLAVIX) 75 MG tablet, Take 75 mg by mouth daily., Disp: , Rfl:  .  finasteride (PROSCAR) 5 MG tablet, Take 1 tablet (5 mg total) by mouth daily., Disp: 90 tablet, Rfl: 1 .  Fluticasone-Salmeterol (ADVAIR DISKUS) 500-50 MCG/DOSE AEPB, Inhale 1 puff into the lungs 2 (two) times daily., Disp: , Rfl:  .  levothyroxine (SYNTHROID, LEVOTHROID) 150 MCG tablet, Take 1 tablet (150 mcg total) by mouth daily., Disp: 90 tablet, Rfl: 3 .  Multiple Vitamins-Minerals (CENTRUM VITAMINTS PO), Take 1 tablet by mouth daily. , Disp: , Rfl:  .  tamsulosin (FLOMAX) 0.4 MG CAPS  capsule, Take 0.4 mg by mouth daily. , Disp: , Rfl:  .  traZODone (DESYREL) 50 MG tablet, Take 1 tablet (50 mg total) by mouth at bedtime., Disp: 90 tablet, Rfl: 1 .  umeclidinium bromide (INCRUSE ELLIPTA) 62.5 MCG/INH AEPB, Inhale 1 puff into the lungs daily., Disp: , Rfl:   Past Medical History: Past Medical History:  Diagnosis Date  . Arthritis   . Asthma   . Coronary artery disease   . Emphysema of lung ([place])   . Hearing difficulty of both ears   . Heart attack   . Heart murmur   . Hyperlipidemia   . Hypertension   . ILD (interstitial lung disease) ([place])   . Parkinson disease ([place])     Tobacco Use: History  Smoking Status  . Former Smoker  . Packs/day: 2.00  . Years: 35.00  . Quit date: 08/15/1985  Smokeless Tobacco  . Never Used    Comment: quit smoking in 1992    Labs: Recent Review Flowsheet Data    Labs for ITP Cardiac and Pulmonary Rehab Latest Ref Rng & Units 07/23/2015   Cholestrol 0 - 200 mg/dL 172   LDLCALC 0 - 99 mg/dL 92   HDL >39.00 mg/dL 57.60   Trlycerides 0.0 - 149.0 mg/dL 108.0   Hemoglobin A1c 4.6 - 6.5 % 5.7      Capillary Blood Glucose: No results found for: GLUCAP   ADL UCSD:   Pulmonary Function Assessment:  Pulmonary Function Assessment - 11/08/15 1100      Breath   Bilateral Breath Sounds Clear   Shortness of Breath Yes      Exercise Target Goals:    Exercise Program Goal: Individual exercise prescription set with THRR, safety & activity barriers. Participant demonstrates ability to understand and report RPE using BORG scale, to self-measure pulse accurately, and to acknowledge the importance of the exercise prescription.  Exercise Prescription Goal: Starting with aerobic activity 30 plus minutes a day, 3 days per week for initial exercise prescription. Provide home exercise prescription and guidelines that participant acknowledges understanding prior to discharge.  Activity Barriers & Risk Stratification:      Activity Barriers & Cardiac Risk Stratification - 11/08/15 1059      Activity Barriers & Cardiac Risk Stratification   Activity Barriers Arthritis      6 Minute Walk:     6 Minute Walk    Row Name 11/11/15 0656         6 Minute Walk   Phase Initial     Distance 825 feet     Walk Time 4.55 minutes  4 minutes and 55 seconds     # of Rest Breaks 2  first rest break: 45 seconds Second Break: 20 seconds     MPH 1.56     METS 2.23     RPE 11     Perceived Dyspnea  1     Symptoms No     Resting HR 101 bpm     Resting BP 100/70     Max Ex. HR 122 bpm     Max Ex. BP 102/74       Interval HR   Baseline HR 101     1 Minute HR 102     2 Minute HR 103     3 Minute HR 115     4 Minute HR 107     5 Minute HR 122     6 Minute HR 120     2 Minute Post HR 113     Interval Heart Rate? Yes       Interval Oxygen   Interval Oxygen? Yes     Baseline Oxygen Saturation % 93 %     Baseline Liters of Oxygen 4 L     1 Minute Oxygen Saturation % 88 %     1 Minute Liters of Oxygen 4 L     2 Minute Oxygen Saturation % 81 %     2 Minute Liters of Oxygen 4 L     3 Minute Oxygen Saturation % 87 %     3 Minute Liters of Oxygen 8 L     4 Minute Oxygen Saturation % 81 %     4 Minute Liters of Oxygen 8 L     5 Minute Oxygen Saturation % 85 %     5 Minute Liters of Oxygen 8 L     6 Minute Oxygen Saturation % 91 %     6 Minute Liters of Oxygen 8 L        Initial Exercise Prescription:     Initial Exercise Prescription - 11/11/15 0600      Date of Initial Exercise RX and Referring Provider   Date 11/11/15   Referring Provider Dr. Alva Garnet     Oxygen   Oxygen Continuous   Liters 6-8     NuStep   Level 2   Minutes 17   METs 1.5  Arm Ergometer   Level 2   Minutes 17     Track   Laps 4   Minutes 17     Prescription Details   Frequency (times per week) 2   Duration Progress to 45 minutes of aerobic exercise without signs/symptoms of physical distress     Intensity    THRR 40-80% of Max Heartrate 56-111   Ratings of Perceived Exertion 11-13   Perceived Dyspnea 0-4     Progression   Progression Continue progressive overload as per policy without signs/symptoms or physical distress.     Resistance Training   Training Prescription Yes   Weight GREEN BANDS   Reps 10-12      Perform Capillary Blood Glucose checks as needed.  Exercise Prescription Changes:     Exercise Prescription Changes    Row Name 11/11/15 1200             Response to Exercise   Blood Pressure (Admit) 112/72       Blood Pressure (Exercise) 150/80       Blood Pressure (Exit) 100/60       Heart Rate (Admit) 77 bpm       Heart Rate (Exercise) 114 bpm       Heart Rate (Exit) 88 bpm       Oxygen Saturation (Admit) 98 %       Oxygen Saturation (Exercise) 86 %       Oxygen Saturation (Exit) 99 %       Rating of Perceived Exertion (Exercise) 12       Perceived Dyspnea (Exercise) 1       Duration Progress to 45 minutes of aerobic exercise without signs/symptoms of physical distress       Intensity -  40-80% of THRR         Progression   Progression Continue to progress workloads to maintain intensity without signs/symptoms of physical distress.         Resistance Training   Training Prescription Yes       Weight green bands       Reps -  10 minutes of strength training         Oxygen   Oxygen Continuous       Liters 8         NuStep   Level 2       Minutes 17         Track   Laps 8       Minutes 17          Exercise Comments:     Exercise Comments    Row Name 11/15/15 1021           Exercise Comments Patient has only attended one sessions. Will cont. to monitor.           Discharge Exercise Prescription (Final Exercise Prescription Changes):     Exercise Prescription Changes - 11/11/15 1200      Response to Exercise   Blood Pressure (Admit) 112/72   Blood Pressure (Exercise) 150/80   Blood Pressure (Exit) 100/60   Heart Rate (Admit) 77  bpm   Heart Rate (Exercise) 114 bpm   Heart Rate (Exit) 88 bpm   Oxygen Saturation (Admit) 98 %   Oxygen Saturation (Exercise) 86 %   Oxygen Saturation (Exit) 99 %   Rating of Perceived Exertion (Exercise) 12   Perceived Dyspnea (Exercise) 1   Duration Progress to 45 minutes of aerobic exercise without signs/symptoms of  physical distress   Intensity --  40-80% of THRR     Progression   Progression Continue to progress workloads to maintain intensity without signs/symptoms of physical distress.     Resistance Training   Training Prescription Yes   Weight green bands   Reps --  10 minutes of strength training     Oxygen   Oxygen Continuous   Liters 8     NuStep   Level 2   Minutes 17     Track   Laps 8   Minutes 17       Nutrition:  Target Goals: Understanding of nutrition guidelines, daily intake of sodium 1500mg , cholesterol 200mg , calories 30% from fat and 7% or less from saturated fats, daily to have 5 or more servings of fruits and vegetables.  Biometrics:     Pre Biometrics - 11/08/15 1102      Pre Biometrics   Grip Strength 30 kg       Nutrition Therapy Plan and Nutrition Goals:   Nutrition Discharge: Rate Your Plate Scores:   Psychosocial: Target Goals: Acknowledge presence or absence of depression, maximize coping skills, provide positive support system. Participant is able to verbalize types and ability to use techniques and skills needed for reducing stress and depression.  Initial Review & Psychosocial Screening:     Initial Psych Review & Screening - 11/08/15 [place]? Yes  daughters live in [place]     Barriers   Psychosocial barriers to participate in program There are no identifiable barriers or psychosocial needs.     Screening Interventions   Interventions Encouraged to exercise      Quality of Life Scores:   PHQ-9: Recent Review Flowsheet Data    Depression  screen [facility] 2/9 11/08/2015   Decreased Interest 0   Down, Depressed, Hopeless 0   PHQ - 2 Score 0      Psychosocial Evaluation and Intervention:     Psychosocial Evaluation - 11/08/15 1106      Psychosocial Evaluation & Interventions   Continued Psychosocial Services Needed --  none have been needed in the past, none needed now      Psychosocial Re-Evaluation:     Psychosocial Re-Evaluation    [place] Name 11/16/15 0657             Psychosocial Re-Evaluation   Interventions Encouraged to attend Pulmonary Rehabilitation for the exercise       Comments no psychosocial barriers identified at this time         Education: Education Goals: Education classes will be provided on a weekly basis, covering required topics. Participant will state understanding/return demonstration of topics presented.  Learning Barriers/Preferences:     Learning Barriers/Preferences - 11/08/15 1059      Learning Barriers/Preferences   Learning Barriers None   Learning Preferences Audio;Group Instruction;Individual Instruction;Skilled Demonstration;Verbal Instruction      Education Topics: Risk Factor Reduction:  -Group instruction that is supported by a PowerPoint presentation. Instructor discusses the definition of a risk factor, different risk factors for pulmonary disease, and how the heart and lungs work together.     Nutrition for Pulmonary Patient:  -Group instruction provided by PowerPoint slides, verbal discussion, and written materials to support subject matter. The instructor gives an explanation and review of healthy diet recommendations, which includes a discussion on weight management, recommendations for fruit and vegetable consumption, as well as protein, fluid, caffeine, fiber, sodium, sugar,  and alcohol. Tips for eating when patients are short of breath are discussed.   Pursed Lip Breathing:  -Group instruction that is supported by demonstration and informational handouts.  Instructor discusses the benefits of pursed lip and diaphragmatic breathing and detailed demonstration on how to preform both.     Oxygen Safety:  -Group instruction provided by PowerPoint, verbal discussion, and written material to support subject matter. There is an overview of "What is Oxygen" and "Why do we need it".  Instructor also reviews how to create a safe environment for oxygen use, the importance of using oxygen as prescribed, and the risks of noncompliance. There is a brief discussion on traveling with oxygen and resources the patient may utilize.   Oxygen Equipment:  -Group instruction provided by [facility] Staff utilizing handouts, written materials, and equipment demonstrations.   Signs and Symptoms:  -Group instruction provided by written material and verbal discussion to support subject matter. Warning signs and symptoms of infection, stroke, and heart attack are reviewed and when to call the physician/911 reinforced. Tips for preventing the spread of infection discussed.   Advanced Directives:  -Group instruction provided by verbal instruction and written material to support subject matter. Instructor reviews Advanced Directive laws and proper instruction for filling out document.   Pulmonary Video:  -Group video education that reviews the importance of medication and oxygen compliance, exercise, good nutrition, pulmonary hygiene, and pursed lip and diaphragmatic breathing for the pulmonary patient.   Exercise for the Pulmonary Patient:  -Group instruction that is supported by a PowerPoint presentation. Instructor discusses benefits of exercise, core components of exercise, frequency, duration, and intensity of an exercise routine, importance of utilizing pulse oximetry during exercise, safety while exercising, and options of places to exercise outside of rehab.     Pulmonary Medications:  -Verbally interactive group education provided by instructor with focus on  inhaled medications and proper administration.   Anatomy and Physiology of the Respiratory System and Intimacy:  -Group instruction provided by PowerPoint, verbal discussion, and written material to support subject matter. Instructor reviews respiratory cycle and anatomical components of the respiratory system and their functions. Instructor also reviews differences in obstructive and restrictive respiratory diseases with examples of each. Intimacy, Sex, and Sexuality differences are reviewed with a discussion on how relationships can change when diagnosed with pulmonary disease. Common sexual concerns are reviewed.   Knowledge Questionnaire Score:   Core Components/Risk Factors/Patient Goals at Admission:     Personal Goals and Risk Factors at Admission - 11/08/15 1102      Core Components/Risk Factors/Patient Goals on Admission   Increase Strength and Stamina Yes   Intervention Provide advice, education, support and counseling about physical activity/exercise needs.;Develop an individualized exercise prescription for aerobic and resistive training based on initial evaluation findings, risk stratification, comorbidities and participant's personal goals.   Expected Outcomes Achievement of increased cardiorespiratory fitness and enhanced flexibility, muscular endurance and strength shown through measurements of functional capacity and personal statement of participant.   Improve shortness of breath with ADL's Yes   Intervention Provide education, individualized exercise plan and daily activity instruction to help decrease symptoms of SOB with activities of daily living.   Expected Outcomes Short Term: Achieves a reduction of symptoms when performing activities of daily living.   Develop more efficient breathing techniques such as purse lipped breathing and diaphragmatic breathing; and practicing self-pacing with activity Yes   Intervention Provide education, demonstration and support about  specific breathing techniuqes utilized for more efficient breathing. Include  techniques such as pursed lipped breathing, diaphragmatic breathing and self-pacing activity.   Expected Outcomes Short Term: Participant will be able to demonstrate and use breathing techniques as needed throughout daily activities.      Core Components/Risk Factors/Patient Goals Review:      Goals and Risk Factor Review    Row Name 11/16/15 0655             Core Components/Risk Factors/Patient Goals Review   Personal Goals Review Increase Strength and Stamina;Improve shortness of breath with ADL's;Develop more efficient breathing techniques such as purse lipped breathing and diaphragmatic breathing and practicing self-pacing with activity.       Review see "comments" section on ITP       Expected Outcomes see "Admission" expected outcomes          Core Components/Risk Factors/Patient Goals at Discharge (Final Review):      Goals and Risk Factor Review - 11/16/15 0655      Core Components/Risk Factors/Patient Goals Review   Personal Goals Review Increase Strength and Stamina;Improve shortness of breath with ADL's;Develop more efficient breathing techniques such as purse lipped breathing and diaphragmatic breathing and practicing self-pacing with activity.   Review see "comments" section on ITP   Expected Outcomes see "Admission" expected outcomes      ITP Comments:   Comments: ITP REVIEW Pt has only attended her first exercise session. Continue to recommend continued exercise, life style modification, education, and utilization of breathing techniques to increase stamina and strength and decrease shortness of breath with exertion. Will follow up with pulmonary rehab goals within the next 30 days.

## 2015-11-18 ENCOUNTER — Encounter (HOSPITAL_COMMUNITY)
Admission: RE | Admit: 2015-11-18 | Discharge: 2015-11-18 | Disposition: A | Discharge status: Home / Self Care | Payer: Medicare Other | Source: Ambulatory Visit | Attending: Pulmonary Disease | Admitting: Pulmonary Disease

## 2015-11-18 VITALS — Wt 185.4 lb

## 2015-11-18 DIAGNOSIS — J8410 Pulmonary fibrosis, unspecified: Secondary | ICD-10-CM

## 2015-11-18 NOTE — Progress Notes (Signed)
Daily Session Note  Patient Details  Name: Bradley Bennett MRN: [id number] Date of Birth: 1933-01-30 Referring Provider:   [name] Pulmonary Rehab Walk Test from 11/09/2015 in [place]  Referring Provider  Dr. Alva Garnet      Encounter Date: 11/18/2015  Check In:     Session Check In - 11/18/15 1219      Check-In   Location [facility]   Staff Present Rosebud Poles, RN, BSN;Aerika Groll, MS, ACSM RCEP, Exercise Physiologist;Lisa Ysidro Evert, RN;Portia Rollene Rotunda, RN, BSN   Supervising physician immediately available to respond to emergencies [facility] immediately available   Physician(s) Dr. Alfredia Ferguson   Medication changes reported     No   Fall or balance concerns reported    No   Warm-up and Cool-down Performed as group-led instruction   Resistance Training Performed Yes   VAD Patient? No     Pain Assessment   Currently in Pain? No/denies   Multiple Pain Sites No      Capillary Blood Glucose: No results found for this or any previous visit (from the past 24 hour(s)).      Exercise Prescription Changes - 11/18/15 1200      Response to Exercise   Blood Pressure (Admit) 110/56   Blood Pressure (Exercise) 118/80   Blood Pressure (Exit) 114/60   Heart Rate (Admit) 94 bpm   Heart Rate (Exercise) 90 bpm   Heart Rate (Exit) 92 bpm   Oxygen Saturation (Admit) 97 %   Oxygen Saturation (Exercise) 94 %   Oxygen Saturation (Exit) 97 %   Rating of Perceived Exertion (Exercise) 11   Perceived Dyspnea (Exercise) 1   Duration Progress to 45 minutes of aerobic exercise without signs/symptoms of physical distress   Intensity --  40-80% of THRR     Progression   Progression Continue to progress workloads to maintain intensity without signs/symptoms of physical distress.     Resistance Training   Training Prescription Yes   Weight green bands   Reps --  10 minutes of strength training     Oxygen   Oxygen Continuous   Liters 8     NuStep   Level 4   Minutes 17   METs 1.8     Arm Ergometer   Level 2   Minutes 17     Goals Met:  Exercise tolerated well No report of cardiac concerns or symptoms Strength training completed today  Goals Unmet:  Not Applicable  Comments: Service time is from 10:30am to 12:10pm    Dr. Rush Farmer is Medical Director for Pulmonary Rehab at [facility].

## 2015-11-23 ENCOUNTER — Encounter (HOSPITAL_COMMUNITY)
Admission: RE | Admit: 2015-11-23 | Discharge: 2015-11-23 | Disposition: A | Discharge status: Home / Self Care | Payer: Medicare Other | Source: Ambulatory Visit | Attending: Pulmonary Disease | Admitting: Pulmonary Disease

## 2015-11-23 VITALS — Wt 187.2 lb

## 2015-11-23 DIAGNOSIS — J8410 Pulmonary fibrosis, unspecified: Secondary | ICD-10-CM

## 2015-11-23 NOTE — Progress Notes (Signed)
Daily Session Note  Patient Details  Name: Bradley Bennett MRN: [id number] Date of Birth: [birth year] Referring Provider:   [name] Pulmonary Rehab Walk Test from 11/09/2015 in [place]  Referring Provider  Dr. Alva Garnet      Encounter Date: 11/23/2015  Check In:     Session Check In - 11/23/15 1013      Check-In   Location [facility]   Staff Present Su Hilt, MS, ACSM RCEP, Exercise Physiologist;Diedra Sinor Leonia Reeves, RN, Luisa Hart, RN, BSN;Ramon Dredge, RN, [facility]   Supervising physician immediately available to respond to emergencies [facility] immediately available   Physician(s) Dr. Alfredia Ferguson   Medication changes reported     No   Fall or balance concerns reported    No   Warm-up and Cool-down Performed as group-led instruction   Resistance Training Performed Yes   VAD Patient? No     Pain Assessment   Currently in Pain? No/denies   Multiple Pain Sites No      Capillary Blood Glucose: No results found for this or any previous visit (from the past 24 hour(s)).      Exercise Prescription Changes - 11/23/15 1200      Response to Exercise   Blood Pressure (Admit) 110/62   Blood Pressure (Exercise) 134/64   Blood Pressure (Exit) 102/64   Heart Rate (Admit) 90 bpm   Heart Rate (Exercise) 83 bpm   Heart Rate (Exit) 83 bpm   Oxygen Saturation (Admit) 99 %   Oxygen Saturation (Exercise) 82 %   Oxygen Saturation (Exit) 100 %   Rating of Perceived Exertion (Exercise) 13   Perceived Dyspnea (Exercise) 2   Duration Progress to 45 minutes of aerobic exercise without signs/symptoms of physical distress   Intensity THRR unchanged     Progression   Progression Continue to progress workloads to maintain intensity without signs/symptoms of physical distress.     Resistance Training   Training Prescription Yes   Weight green bands   Reps 10-12  10 minutes of strength training     Interval Training   Interval Training No     Oxygen   Oxygen Continuous   Liters 8     NuStep   Level 2   Minutes 17   METs 1.6     Arm Ergometer   Level 2   Minutes 17     Track   Laps 11   Minutes 17     Goals Met:  Exercise tolerated well Strength training completed today  Goals Unmet:  Not Applicable  Comments: Service time is from 1030 to 1205   Dr. Rush Farmer is Medical Director for Pulmonary Rehab at [facility].

## 2015-11-25 ENCOUNTER — Encounter (HOSPITAL_COMMUNITY)
Admission: RE | Admit: 2015-11-25 | Discharge: 2015-11-25 | Disposition: A | Discharge status: Home / Self Care | Payer: Medicare Other | Source: Ambulatory Visit | Attending: Pulmonary Disease | Admitting: Pulmonary Disease

## 2015-11-25 VITALS — Wt 188.3 lb

## 2015-11-25 DIAGNOSIS — J8410 Pulmonary fibrosis, unspecified: Secondary | ICD-10-CM

## 2015-11-25 NOTE — Progress Notes (Signed)
Daily Session Note  Patient Details  Name: Bradley Bennett MRN: [id number] Date of Birth: [birth year] Referring Provider:   [name] Pulmonary Rehab Walk Test from 11/09/2015 in [place]  Referring Provider  Dr. Alva Garnet      Encounter Date: 11/25/2015  Check In:     Session Check In - 11/25/15 1030      Check-In   Location [facility]   Staff Present Rosebud Poles, RN, BSN;Janvi Ammar, MS, ACSM RCEP, Exercise Physiologist;Annedrea Rosezella [place], RN, MHA;Portia Rollene Rotunda, RN, BSN   Supervising physician immediately available to respond to emergencies [facility] immediately available   Physician(s) Dr. Rockne Menghini   Medication changes reported     No   Fall or balance concerns reported    No   Warm-up and Cool-down Performed as group-led instruction   Resistance Training Performed Yes   VAD Patient? No     Pain Assessment   Currently in Pain? No/denies   Multiple Pain Sites No      Capillary Blood Glucose: No results found for this or any previous visit (from the past 24 hour(s)).      Exercise Prescription Changes - 11/25/15 1200      Response to Exercise   Blood Pressure (Admit) 104/56   Blood Pressure (Exercise) 132/72   Blood Pressure (Exit) 104/60   Heart Rate (Admit) 86 bpm   Heart Rate (Exercise) 91 bpm   Heart Rate (Exit) 75 bpm   Oxygen Saturation (Admit) 95 %   Oxygen Saturation (Exercise) 94 %   Oxygen Saturation (Exit) 95 %   Rating of Perceived Exertion (Exercise) 12   Perceived Dyspnea (Exercise) 1   Duration Progress to 45 minutes of aerobic exercise without signs/symptoms of physical distress   Intensity THRR unchanged     Progression   Progression Continue to progress workloads to maintain intensity without signs/symptoms of physical distress.     Resistance Training   Training Prescription Yes   Weight green bands   Reps 10-12  10 minutes of strength training     Interval Training   Interval Training No     Oxygen   Oxygen Continuous   Liters 8     NuStep   Level 2   Minutes 17   METs 1.7     Arm Ergometer   Level 2   Minutes 17     Goals Met:  Exercise tolerated well No report of cardiac concerns or symptoms Strength training completed today  Goals Unmet:  Not Applicable  Comments: Service time is from 10:30am to 12:00pm    Dr. Rush Farmer is Medical Director for Pulmonary Rehab at [facility].

## 2015-11-30 ENCOUNTER — Encounter (HOSPITAL_COMMUNITY)
Admission: RE | Admit: 2015-11-30 | Discharge: 2015-11-30 | Disposition: A | Discharge status: Home / Self Care | Payer: Medicare Other | Source: Ambulatory Visit | Attending: Pulmonary Disease | Admitting: Pulmonary Disease

## 2015-11-30 VITALS — Wt 184.5 lb

## 2015-11-30 DIAGNOSIS — J8410 Pulmonary fibrosis, unspecified: Secondary | ICD-10-CM

## 2015-11-30 NOTE — Progress Notes (Signed)
Daily Session Note  Patient Details  Name: Bradley Bennett MRN: [id number] Date of Birth: October 26, 1932 Referring Provider:   [name] Pulmonary Rehab Walk Test from 11/09/2015 in [place]  Referring Provider  Dr. Alva Garnet      Encounter Date: 11/30/2015  Check In:     Session Check In - 11/30/15 1016      Check-In   Location [facility]   Staff Present Su Hilt, MS, ACSM RCEP, Exercise Physiologist;Joan Leonia Reeves, RN, Luisa Hart, RN, Roque Cash, RN   Supervising physician immediately available to respond to emergencies [facility] immediately available   Physician(s) Dr. Rockne Menghini   Medication changes reported     No   Fall or balance concerns reported    No   Warm-up and Cool-down Performed as group-led instruction   Resistance Training Performed Yes   VAD Patient? No     Pain Assessment   Currently in Pain? No/denies   Multiple Pain Sites No      Capillary Blood Glucose: No results found for this or any previous visit (from the past 24 hour(s)).      Exercise Prescription Changes - 11/30/15 1226      Exercise Review   Progression Yes     Response to Exercise   Blood Pressure (Admit) 118/60   Blood Pressure (Exercise) 130/70   Blood Pressure (Exit) 122/68   Heart Rate (Admit) 91 bpm   Heart Rate (Exercise) 105 bpm   Heart Rate (Exit) 93 bpm   Oxygen Saturation (Admit) 94 %   Oxygen Saturation (Exercise) 83 %  increased to 93 with o2 increase to 10L   Oxygen Saturation (Exit) 96 %   Rating of Perceived Exertion (Exercise) 12   Perceived Dyspnea (Exercise) 1   Duration Progress to 45 minutes of aerobic exercise without signs/symptoms of physical distress   Intensity THRR unchanged     Progression   Progression Continue to progress workloads to maintain intensity without signs/symptoms of physical distress.     Resistance Training   Training Prescription Yes   Weight green bands   Reps 10-12  10 minutes of strength training     Interval Training   Interval Training No     Oxygen   Oxygen Continuous   Liters 8  increased to 10 on track, 4 at rest     NuStep   Level 3   Minutes 17   METs 1.6     Arm Ergometer   Level 3   Minutes 17     Track   Laps 8   Minutes 17     Goals Met:  Exercise tolerated well Queuing for purse lip breathing No report of cardiac concerns or symptoms Strength training completed today  Goals Unmet:  O2 Sat  Comments: Service time is from 1030 to 1200   Dr. Rush Farmer is Medical Director for Pulmonary Rehab at [facility].

## 2015-12-02 ENCOUNTER — Encounter (HOSPITAL_COMMUNITY)
Admission: RE | Admit: 2015-12-02 | Discharge: 2015-12-02 | Disposition: A | Discharge status: Home / Self Care | Payer: Medicare Other | Source: Ambulatory Visit | Attending: Pulmonary Disease | Admitting: Pulmonary Disease

## 2015-12-02 VITALS — Wt 185.2 lb

## 2015-12-02 DIAGNOSIS — J8410 Pulmonary fibrosis, unspecified: Secondary | ICD-10-CM

## 2015-12-02 NOTE — Progress Notes (Signed)
Daily Session Note  Patient Details  Name: Bradley Bennett MRN: [id number] Date of Birth: July 21, 1932 Referring Provider:   [name] Pulmonary Rehab Walk Test from 11/09/2015 in [place]  Referring Provider  Dr. Alva Garnet      Encounter Date: 12/02/2015  Check In:     Session Check In - 12/02/15 1019      Check-In   Location [facility]   Staff Present Rosebud Poles, RN, BSN;Lisa Ysidro Evert, RN;Molly diVincenzo, MS, ACSM RCEP, Exercise Physiologist;Tylisa Alcivar Rollene Rotunda, RN, BSN   Supervising physician immediately available to respond to emergencies [facility] immediately available   Physician(s) Dr. Alfredia Ferguson   Medication changes reported     No   Fall or balance concerns reported    No   Warm-up and Cool-down Performed as group-led instruction   Resistance Training Performed Yes   VAD Patient? No     Pain Assessment   Currently in Pain? No/denies   Multiple Pain Sites No      Capillary Blood Glucose: No results found for this or any previous visit (from the past 24 hour(s)).      Exercise Prescription Changes - 12/02/15 1324      Response to Exercise   Blood Pressure (Admit) 82/60   Blood Pressure (Exercise) 140/74   Blood Pressure (Exit) 98/54   Heart Rate (Admit) 78 bpm   Heart Rate (Exercise) 85 bpm   Heart Rate (Exit) 75 bpm   Oxygen Saturation (Admit) 98 %   Oxygen Saturation (Exercise) 94 %   Oxygen Saturation (Exit) 96 %   Rating of Perceived Exertion (Exercise) 12   Perceived Dyspnea (Exercise) 1   Duration Progress to 45 minutes of aerobic exercise without signs/symptoms of physical distress   Intensity THRR unchanged     Progression   Progression Continue to progress workloads to maintain intensity without signs/symptoms of physical distress.     Resistance Training   Training Prescription Yes   Weight green bands   Reps 10-12  10 minutes of strength training     Interval Training   Interval  Training No     Oxygen   Oxygen Continuous   Liters 8  increased to 10 on track, 4 at rest     NuStep   Level 3   Minutes 17   METs 1.6     Arm Ergometer   Level 3   Minutes 17     Goals Met:  Exercise tolerated well Queuing for purse lip breathing No report of cardiac concerns or symptoms Strength training completed today  Goals Unmet:  Not Applicable  Comments: Service time is from 1030 to 1230. Patient also attended a question and answer session with pulmonary rehab medical director Dr. Ellin Goodie.   Dr. Rush Farmer is Medical Director for Pulmonary Rehab at [facility].

## 2015-12-03 ENCOUNTER — Telehealth: Payer: Self-pay | Admitting: Cardiology

## 2015-12-03 NOTE — Telephone Encounter (Signed)
New message     Patient daughter calling stating she wants to transfer her father services over to Dr. Radford Pax - due to transportation  & closer to home.

## 2015-12-03 NOTE — Telephone Encounter (Signed)
I'm fine with that 

## 2015-12-07 ENCOUNTER — Encounter (HOSPITAL_COMMUNITY): Admission: RE | Admit: 2015-12-07 | Payer: Medicare Other | Source: Ambulatory Visit

## 2015-12-07 ENCOUNTER — Telehealth: Payer: Self-pay | Admitting: Pulmonary Disease

## 2015-12-07 DIAGNOSIS — J8410 Pulmonary fibrosis, unspecified: Secondary | ICD-10-CM

## 2015-12-07 NOTE — Telephone Encounter (Signed)
Molly with [facility] returned my call and stated that while patient is at [place] with exertion pt is requiring 10 lpm. Pt on 6 lpm is dropping into the low 80's.On 4 lpm at rest patient is dropping to 70's. Molly's recommendation at the present time is for pt to be placed on 10 lpm with exertion and 6 lpm at rest.    Please place referral to [facility] with change in o2 flow. Pt will need a 10 liter concentrator and 10 lpm regulator.   Thanks, Catha Gosselin

## 2015-12-07 NOTE — Telephone Encounter (Signed)
This does not appear to be our recommendation. It might be the recommendation from pulm rehab. If so, make the referral. If not, he needs ambulatory oximetry to determine what his O2 needs are - either in our office or @ pulm rehab center.  Misty, please assist  Thanks  Waunita Schooner

## 2015-12-07 NOTE — Telephone Encounter (Signed)
That is ok 

## 2015-12-07 NOTE — Telephone Encounter (Signed)
Called Pulmonary Rehab at [facility] and spoke with [facility]. Bradley Bennett will have Therapist or nurse call me back to advise on the liter flow issue, is this a liter flow they recommended pt to be on during exertion of 10 lpm. Bradley Bennett

## 2015-12-07 NOTE — Telephone Encounter (Signed)
Forde Radon with [facility] called, stating that patient has contacted them requesting a 10 liter concentrator and a 10 liter regulator. Pt is turning his o2 liter flow up to 10 lpm with exertion which he states that the physician told him he could turn it up between 8-10 lpm.  [facility] order states 6 lpm with exertion and 4 lpm at rest.    If physician is agreeable with this we will need a new referral to [facility] for a 10 liter concentrator and a 10 liter regulator.  Please advise. Rhonda J Cobb

## 2015-12-08 NOTE — Telephone Encounter (Signed)
Order placed for [facility] for a change in pt's O2 per message below. Nothing further needed.

## 2015-12-09 ENCOUNTER — Encounter (HOSPITAL_COMMUNITY)
Admission: RE | Admit: 2015-12-09 | Discharge: 2015-12-09 | Disposition: A | Discharge status: Home / Self Care | Payer: Medicare Other | Source: Ambulatory Visit | Attending: Pulmonary Disease | Admitting: Pulmonary Disease

## 2015-12-09 VITALS — Wt 185.0 lb

## 2015-12-09 DIAGNOSIS — J8410 Pulmonary fibrosis, unspecified: Secondary | ICD-10-CM | POA: Diagnosis not present

## 2015-12-09 NOTE — Progress Notes (Signed)
Bradley Bennett 82 y.o. male Nutrition Note Spoke with pt. Pt is overweight. Per discussion, pt lost wt several years ago and has maintained his wt for the past 1.5 years, which pt is pleased about. There are some ways the pt can make his eating habits healthier. Pt's Rate Your Plate results reviewed with pt. Age-appropriate nutrition recommendations discussed. Pt does not look to avoid salty food; "I used to eat out more frequently just for variety." Pt states he lives in a facility that feeds him 3 meals/d. The role of sodium in lung disease reviewed with pt. According to pt's last A1c, pt is pre-diabetic. Pre-diabetes discussed. Pt encouraged to discuss pre-diabetes with his PCP further. Pt expressed understanding of the information reviewed via feedback method.    Lab Results  Component Value Date   HGBA1C 5.7 07/23/2015   Nutrition Diagnosis ? Food-and nutrition-related knowledge deficit related to lack of exposure to information as related to diagnosis of pulmonary disease Nutrition Intervention ? Pt's individual nutrition plan and goals reviewed with pt. ? Benefits of adopting healthy eating habits discussed when pt's Rate Your Plate reviewed. ? Pt to attend the Nutrition and Lung Disease class ? Continual client-centered nutrition education by RD, as part of interdisciplinary care. Goal(s) Describe the benefit of including fruits, vegetables, whole grains, and low-fat dairy products in a healthy meal plan. Monitor and Evaluate progress toward nutrition goal with team.   Derek Mound, M.Ed, RD, LDN, CDE 12/09/2015 12:31 PM

## 2015-12-09 NOTE — Progress Notes (Signed)
Daily Session Note  Patient Details  Name: Bradley Bennett MRN: [id number] Date of Birth: 1932-08-28 Referring Provider:   [name] Pulmonary Rehab Walk Test from 11/09/2015 in [place]  Referring Provider  Dr. Alva Garnet      Encounter Date: 12/09/2015  Check In:     Session Check In - 12/09/15 1023      Check-In   Location [facility]   Staff Present Rosebud Poles, RN, BSN;Shakayla Hickox Ysidro Evert, RN;Molly diVincenzo, MS, ACSM RCEP, Exercise Physiologist;Portia Rollene Rotunda, RN, BSN   Supervising physician immediately available to respond to emergencies [facility] immediately available   Physician(s) Dr. Lonny Prude   Medication changes reported     No   Fall or balance concerns reported    No   Warm-up and Cool-down Performed as group-led instruction   Resistance Training Performed Yes   VAD Patient? No     Pain Assessment   Currently in Pain? No/denies   Multiple Pain Sites No      Capillary Blood Glucose: No results found for this or any previous visit (from the past 24 hour(s)).      Exercise Prescription Changes - 12/09/15 1200      Response to Exercise   Blood Pressure (Admit) 121/72   Blood Pressure (Exercise) 136/70   Blood Pressure (Exit) 112/60   Heart Rate (Admit) 89 bpm   Heart Rate (Exercise) 107 bpm   Heart Rate (Exit) 91 bpm   Oxygen Saturation (Admit) 97 %   Oxygen Saturation (Exercise) 88 %   Oxygen Saturation (Exit) 99 %   Rating of Perceived Exertion (Exercise) 11   Perceived Dyspnea (Exercise) 1   Duration Progress to 45 minutes of aerobic exercise without signs/symptoms of physical distress   Intensity THRR unchanged     Progression   Progression Continue to progress workloads to maintain intensity without signs/symptoms of physical distress.     Resistance Training   Training Prescription Yes   Weight greeen bands   Reps 10-12  10 minutes of strength training     Oxygen   Oxygen Continuous   Liters 8     NuStep   Level 3   Minutes 17   METs 1.6     Track   Laps 13   Minutes 17     Goals Met:  Exercise tolerated well No report of cardiac concerns or symptoms Strength training completed today  Goals Unmet:  Not Applicable  Comments: Service time is from 1030 to 1230    Dr. Rush Farmer is Medical Director for Pulmonary Rehab at [facility].

## 2015-12-13 ENCOUNTER — Other Ambulatory Visit: Payer: Self-pay | Admitting: *Deleted

## 2015-12-13 DIAGNOSIS — J449 Chronic obstructive pulmonary disease, unspecified: Secondary | ICD-10-CM

## 2015-12-14 ENCOUNTER — Encounter (HOSPITAL_COMMUNITY)
Admission: RE | Admit: 2015-12-14 | Discharge: 2015-12-14 | Disposition: A | Discharge status: Home / Self Care | Payer: Medicare Other | Source: Ambulatory Visit | Attending: Pulmonary Disease | Admitting: Pulmonary Disease

## 2015-12-14 VITALS — Wt 187.4 lb

## 2015-12-14 DIAGNOSIS — J8410 Pulmonary fibrosis, unspecified: Secondary | ICD-10-CM

## 2015-12-14 NOTE — Progress Notes (Signed)
Daily Session Note  Patient Details  Name: Nevaeh Casillas MRN: [id number] Date of Birth: 20-Jun-1932 Referring Provider:   [name] Pulmonary Rehab Walk Test from 11/09/2015 in [place]  Referring Provider  Dr. Alva Garnet      Encounter Date: 12/14/2015  Check In:     Session Check In - 12/14/15 1036      Check-In   Location [facility]   Staff Present Rosebud Poles, RN, BSN;Molly diVincenzo, MS, ACSM RCEP, Exercise Physiologist;Lisa Ysidro Evert, RN;Portia Rollene Rotunda, RN, BSN   Supervising physician immediately available to respond to emergencies [facility] immediately available   Physician(s) Dr. Lonny Prude   Medication changes reported     No   Fall or balance concerns reported    No   Warm-up and Cool-down Performed as group-led instruction   Resistance Training Performed Yes   VAD Patient? No     Pain Assessment   Currently in Pain? No/denies   Multiple Pain Sites No      Capillary Blood Glucose: No results found for this or any previous visit (from the past 24 hour(s)).      Exercise Prescription Changes - 12/14/15 1200      Response to Exercise   Blood Pressure (Admit) 122/60   Blood Pressure (Exercise) 124/64   Blood Pressure (Exit) 106/60   Heart Rate (Admit) 94 bpm   Heart Rate (Exercise) 112 bpm   Heart Rate (Exit) 93 bpm   Oxygen Saturation (Admit) 97 %   Oxygen Saturation (Exercise) 93 %   Oxygen Saturation (Exit) 98 %   Rating of Perceived Exertion (Exercise) 14   Perceived Dyspnea (Exercise) 1   Duration Progress to 45 minutes of aerobic exercise without signs/symptoms of physical distress   Intensity THRR unchanged     Progression   Progression Continue to progress workloads to maintain intensity without signs/symptoms of physical distress.     Resistance Training   Training Prescription Yes   Weight greeen bands   Reps 10-12  10 minutes of strength training     Interval Training   Interval  Training No     Oxygen   Oxygen Continuous   Liters 8-10     NuStep   Level 3   Minutes 17   METs 1.4     Arm Ergometer   Level 3   Minutes 17     Track   Laps 12   Minutes 17     Goals Met:  Exercise tolerated well Strength training completed today  Goals Unmet:  Not Applicable  Comments: Service time is from 1030 to 1205    Dr. Rush Farmer is Medical Director for Pulmonary Rehab at [facility].

## 2015-12-14 NOTE — Progress Notes (Signed)
I have reviewed a Home Exercise Prescription with [name] . Bradley Bennett is not currently exercising at home.  The patient was advised to walk 2-3 days a week for 30 minutes.  Hassell Done and I discussed how to progress their exercise prescription.  The patient stated that their goals were to increase daily energy, increase stamina, and increase strength in legs.  The patient stated that they understand the exercise prescription.  We reviewed exercise guidelines, target heart rate during exercise, oxygen use, weather, home pulse oximeter, endpoints for exercise, and goals.  Patient is encouraged to come to me with any questions. I will continue to follow up with the patient to assist them with progression and safety.

## 2015-12-14 NOTE — Progress Notes (Signed)
Pulmonary Individual Treatment Plan  Patient Details  Name: Bradley Bennett MRN: [id number] Date of Birth: 13-Jun-1932 Referring Provider:   [name] Pulmonary Rehab Walk Test from 11/09/2015 in [place]  Referring Provider  Dr. Alva Garnet      Initial Encounter Date:  Flowsheet Row Pulmonary Rehab Walk Test from 11/09/2015 in [place]  Date  11/11/15  Referring Provider  Dr. 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  acetaminophen (TYLENOL) 500 MG tablet, Take 500 mg by mouth as needed., Disp: , Rfl:  .  ALPRAZolam (XANAX) 0.25 MG tablet, Take 0.25 mg by mouth at bedtime as needed for anxiety., Disp: , Rfl:  .  atorvastatin (LIPITOR) 10 MG tablet, Take 10 mg by mouth daily., Disp: , Rfl:  .  busPIRone (BUSPAR) 5 MG tablet, Take 1 tablet (5 mg total) by mouth daily., Disp: 90 tablet, Rfl: 1 .  carbidopa-levodopa (SINEMET CR) 50-200 MG tablet, Take 1 tablet by mouth at bedtime., Disp: 90 tablet, Rfl: 1 .  carbidopa-levodopa (SINEMET IR) 25-100 MG tablet, Take 2 tablets by mouth 3 (three) times daily., Disp: 540 tablet, Rfl: 1 .  clopidogrel (PLAVIX) 75 MG tablet, Take 75 mg by mouth daily., Disp: , Rfl:  .  finasteride (PROSCAR) 5 MG tablet, Take 1 tablet (5 mg total) by mouth daily., Disp: 90 tablet, Rfl: 1 .  Fluticasone-Salmeterol (ADVAIR DISKUS) 500-50 MCG/DOSE AEPB, Inhale 1 puff into the lungs 2 (two) times daily., Disp: , Rfl:  .  levothyroxine (SYNTHROID, LEVOTHROID) 150 MCG tablet, Take 1 tablet (150 mcg total) by mouth daily., Disp: 90 tablet, Rfl: 3 .  Multiple Vitamins-Minerals (CENTRUM VITAMINTS PO), Take 1 tablet by mouth daily. , Disp: , Rfl:  .  tamsulosin (FLOMAX) 0.4 MG CAPS capsule, Take 0.4 mg by mouth daily. , Disp: , Rfl:  .  traZODone (DESYREL) 50 MG tablet, Take 1 tablet (50 mg total)  by mouth at bedtime., Disp: 90 tablet, Rfl: 1 .  umeclidinium bromide (INCRUSE ELLIPTA) 62.5 MCG/INH AEPB, Inhale 1 puff into the lungs daily., Disp: , Rfl:   Past Medical History: Past Medical History:  Diagnosis Date  . Arthritis   . Asthma   . Coronary artery disease   . Emphysema of lung ([place])   . Hearing difficulty of both ears   . Heart attack   . Heart murmur   . Hyperlipidemia   . Hypertension   . ILD (interstitial lung disease) ([place])   . Parkinson disease ([place])     Tobacco Use: History  Smoking Status  . Former Smoker  . Packs/day: 2.00  . Years: 35.00  . Quit date: 08/15/1985  Smokeless Tobacco  . Never Used    Comment: quit smoking in 1992    Labs: Recent Review Flowsheet Data    Labs for ITP Cardiac and Pulmonary Rehab Latest Ref Rng & Units 07/23/2015   Cholestrol 0 - 200 mg/dL 172   LDLCALC 0 - 99 mg/dL 92   HDL >39.00 mg/dL 57.60   Trlycerides 0.0 - 149.0 mg/dL 108.0   Hemoglobin A1c 4.6 - 6.5 % 5.7      Capillary Blood Glucose: No results found for: GLUCAP   ADL UCSD:     Pulmonary Assessment Scores    Row Name 11/16/15 1617         ADL UCSD   ADL  Phase Entry     SOB Score total 86        Pulmonary Function Assessment:     Pulmonary Function Assessment - 11/08/15 1100      Breath   Bilateral Breath Sounds Clear   Shortness of Breath Yes      Exercise Target Goals:    Exercise Program Goal: Individual exercise prescription set with THRR, safety & activity barriers. Participant demonstrates ability to understand and report RPE using BORG scale, to self-measure pulse accurately, and to acknowledge the importance of the exercise prescription.  Exercise Prescription Goal: Starting with aerobic activity 30 plus minutes a day, 3 days per week for initial exercise prescription. Provide home exercise prescription and guidelines that participant acknowledges understanding prior to discharge.  Activity Barriers & Risk  Stratification:     Activity Barriers & Cardiac Risk Stratification - 11/08/15 1059      Activity Barriers & Cardiac Risk Stratification   Activity Barriers Arthritis      6 Minute Walk:     6 Minute Walk    Row Name 11/11/15 0656         6 Minute Walk   Phase Initial     Distance 825 feet     Walk Time 4.55 minutes  4 minutes and 55 seconds     # of Rest Breaks 2  first rest break: 45 seconds Second Break: 20 seconds     MPH 1.56     METS 2.23     RPE 11     Perceived Dyspnea  1     Symptoms No     Resting HR 101 bpm     Resting BP 100/70     Max Ex. HR 122 bpm     Max Ex. BP 102/74       Interval HR   Baseline HR 101     1 Minute HR 102     2 Minute HR 103     3 Minute HR 115     4 Minute HR 107     5 Minute HR 122     6 Minute HR 120     2 Minute Post HR 113     Interval Heart Rate? Yes       Interval Oxygen   Interval Oxygen? Yes     Baseline Oxygen Saturation % 93 %     Baseline Liters of Oxygen 4 L     1 Minute Oxygen Saturation % 88 %     1 Minute Liters of Oxygen 4 L     2 Minute Oxygen Saturation % 81 %     2 Minute Liters of Oxygen 4 L     3 Minute Oxygen Saturation % 87 %     3 Minute Liters of Oxygen 8 L     4 Minute Oxygen Saturation % 81 %     4 Minute Liters of Oxygen 8 L     5 Minute Oxygen Saturation % 85 %     5 Minute Liters of Oxygen 8 L     6 Minute Oxygen Saturation % 91 %     6 Minute Liters of Oxygen 8 L        Initial Exercise Prescription:     Initial Exercise Prescription - 11/11/15 0600      Date of Initial Exercise RX and Referring Provider   Date 11/11/15   Referring Provider Dr. Alva Garnet     Oxygen  Oxygen Continuous   Liters 6-8     NuStep   Level 2   Minutes 17   METs 1.5     Arm Ergometer   Level 2   Minutes 17     Track   Laps 4   Minutes 17     Prescription Details   Frequency (times per week) 2   Duration Progress to 45 minutes of aerobic exercise without signs/symptoms of physical  distress     Intensity   THRR 40-80% of Max Heartrate 56-111   Ratings of Perceived Exertion 11-13   Perceived Dyspnea 0-4     Progression   Progression Continue progressive overload as per policy without signs/symptoms or physical distress.     Resistance Training   Training Prescription Yes   Weight GREEN BANDS   Reps 10-12      Perform Capillary Blood Glucose checks as needed.  Exercise Prescription Changes:     Exercise Prescription Changes    Row Name 11/11/15 1200 11/16/15 1200 11/18/15 1200 11/23/15 1200 11/25/15 1200     Response to Exercise   Blood Pressure (Admit) 112/72 114/70 110/56 110/62 104/56   Blood Pressure (Exercise) 150/80 130/66 118/80 134/64 132/72   Blood Pressure (Exit) 100/60 102/60 114/60 102/64 104/60   Heart Rate (Admit) 77 bpm 93 bpm 94 bpm 90 bpm 86 bpm   Heart Rate (Exercise) 114 bpm 111 bpm 90 bpm 83 bpm 91 bpm   Heart Rate (Exit) 88 bpm 90 bpm 92 bpm 83 bpm 75 bpm   Oxygen Saturation (Admit) 98 % 95 % 97 % 99 % 95 %   Oxygen Saturation (Exercise) 86 % 82 % 94 % 82 % 94 %   Oxygen Saturation (Exit) 99 % 97 % 97 % 100 % 95 %   Rating of Perceived Exertion (Exercise) 12 13 11 13 12    Perceived Dyspnea (Exercise) 1 1 1 2 1    Duration Progress to 45 minutes of aerobic exercise without signs/symptoms of physical distress Progress to 45 minutes of aerobic exercise without signs/symptoms of physical distress Progress to 45 minutes of aerobic exercise without signs/symptoms of physical distress Progress to 45 minutes of aerobic exercise without signs/symptoms of physical distress Progress to 45 minutes of aerobic exercise without signs/symptoms of physical distress   Intensity -  40-80% of THRR -  40-80% of THRR -  40-80% of THRR THRR unchanged THRR unchanged     Progression   Progression Continue to progress workloads to maintain intensity without signs/symptoms of physical distress. Continue to progress workloads to maintain intensity without  signs/symptoms of physical distress. Continue to progress workloads to maintain intensity without signs/symptoms of physical distress. Continue to progress workloads to maintain intensity without signs/symptoms of physical distress. Continue to progress workloads to maintain intensity without signs/symptoms of physical distress.     Resistance Training   Training Prescription Yes Yes Yes Yes Yes   Weight green bands green bands green bands green bands green bands   Reps -  10 minutes of strength training -  10 minutes of strength training -  10 minutes of strength training 10-12  10 minutes of strength training 10-12  10 minutes of strength training     Interval Training   Interval Training  -  -  - No No     Oxygen   Oxygen Continuous Continuous Continuous Continuous Continuous   Liters 8 8 8 8 8      NuStep   Level 2 4 4  2 2   Minutes 17 17 17 17 17    METs  - 2 1.8 1.6 1.7     Arm Ergometer   Level  - 2 2 2 2    Minutes  - 17 17 17 17      Track   Laps 8 8  - 11  -   Minutes 17 17  - 17  -   Row Name 11/30/15 1226 12/02/15 1324 12/09/15 1200         Exercise Review   Progression Yes  -  -       Response to Exercise   Blood Pressure (Admit) 118/60 82/60 121/72     Blood Pressure (Exercise) 130/70 140/74 136/70     Blood Pressure (Exit) 122/68 98/54 112/60     Heart Rate (Admit) 91 bpm 78 bpm 89 bpm     Heart Rate (Exercise) 105 bpm 85 bpm 107 bpm     Heart Rate (Exit) 93 bpm 75 bpm 91 bpm     Oxygen Saturation (Admit) 94 % 98 % 97 %     Oxygen Saturation (Exercise) 83 %  increased to 93 with o2 increase to 10L 94 % 88 %     Oxygen Saturation (Exit) 96 % 96 % 99 %     Rating of Perceived Exertion (Exercise) 12 12 11      Perceived Dyspnea (Exercise) 1 1 1      Duration Progress to 45 minutes of aerobic exercise without signs/symptoms of physical distress Progress to 45 minutes of aerobic exercise without signs/symptoms of physical distress Progress to 45 minutes of  aerobic exercise without signs/symptoms of physical distress     Intensity THRR unchanged THRR unchanged THRR unchanged       Progression   Progression Continue to progress workloads to maintain intensity without signs/symptoms of physical distress. Continue to progress workloads to maintain intensity without signs/symptoms of physical distress. Continue to progress workloads to maintain intensity without signs/symptoms of physical distress.       Resistance Training   Training Prescription Yes Yes Yes     Weight green bands green bands greeen bands     Reps 10-12  10 minutes of strength training 10-12  10 minutes of strength training 10-12  10 minutes of strength training       Interval Training   Interval Training No No  -       Oxygen   Oxygen Continuous Continuous Continuous     Liters 8  increased to 10 on track, 4 at rest 8  increased to 10 on track, 4 at rest 8       NuStep   Level 3 3 3      Minutes 17 17 17      METs 1.6 1.6 1.6       Arm Ergometer   Level 3 3  -     Minutes 17 17  -       Track   Laps 8  - 13     Minutes 17  - 17        Exercise Comments:     Exercise Comments    Row Name 11/15/15 1021 12/13/15 1644         Exercise Comments Patient has only attended one sessions. Will cont. to monitor.  Patient is slowly progressing in Pulmonary Rehab. We have had to adjust his oxygen needs during exercise. He is now on 10 liters while walking. Will cont. to monitor and progress.  Discharge Exercise Prescription (Final Exercise Prescription Changes):     Exercise Prescription Changes - 12/09/15 1200      Response to Exercise   Blood Pressure (Admit) 121/72   Blood Pressure (Exercise) 136/70   Blood Pressure (Exit) 112/60   Heart Rate (Admit) 89 bpm   Heart Rate (Exercise) 107 bpm   Heart Rate (Exit) 91 bpm   Oxygen Saturation (Admit) 97 %   Oxygen Saturation (Exercise) 88 %   Oxygen Saturation (Exit) 99 %   Rating of Perceived Exertion  (Exercise) 11   Perceived Dyspnea (Exercise) 1   Duration Progress to 45 minutes of aerobic exercise without signs/symptoms of physical distress   Intensity THRR unchanged     Progression   Progression Continue to progress workloads to maintain intensity without signs/symptoms of physical distress.     Resistance Training   Training Prescription Yes   Weight greeen bands   Reps 10-12  10 minutes of strength training     Oxygen   Oxygen Continuous   Liters 8     NuStep   Level 3   Minutes 17   METs 1.6     Track   Laps 13   Minutes 17       Nutrition:  Target Goals: Understanding of nutrition guidelines, daily intake of sodium 1500mg , cholesterol 200mg , calories 30% from fat and 7% or less from saturated fats, daily to have 5 or more servings of fruits and vegetables.  Biometrics:     Pre Biometrics - 11/08/15 1102      Pre Biometrics   Grip Strength 30 kg       Nutrition Therapy Plan and Nutrition Goals:   Nutrition Discharge: Rate Your Plate Scores:   Psychosocial: Target Goals: Acknowledge presence or absence of depression, maximize coping skills, provide positive support system. Participant is able to verbalize types and ability to use techniques and skills needed for reducing stress and depression.  Initial Review & Psychosocial Screening:     Initial Psych Review & Screening - 11/08/15 [place]? Yes  daughters live in [place]     Barriers   Psychosocial barriers to participate in program There are no identifiable barriers or psychosocial needs.     Screening Interventions   Interventions Encouraged to exercise      Quality of Life Scores:     Quality of Life - 11/16/15 1618      Quality of Life Scores   Health/Function Pre 14.43 %   Socioeconomic Pre 21.36 %   Psych/Spiritual Pre 16.64 %   Family Pre 20.5 %   GLOBAL Pre 17.11 %      PHQ-9: Recent Review Flowsheet Data     Depression screen [facility] 2/9 11/08/2015   Decreased Interest 0   Down, Depressed, Hopeless 0   PHQ - 2 Score 0      Psychosocial Evaluation and Intervention:     Psychosocial Evaluation - 11/08/15 1106      Psychosocial Evaluation & Interventions   Continued Psychosocial Services Needed --  none have been needed in the past, none needed now      Psychosocial Re-Evaluation:     Psychosocial Re-Evaluation    [place] Name 11/16/15 0657 12/14/15 0659           Psychosocial Re-Evaluation   Interventions Encouraged to attend Pulmonary Rehabilitation for the exercise Encouraged to attend Pulmonary Rehabilitation for the exercise  Comments no psychosocial barriers identified at this time no psychosocial barriers identified at this time        Education: Education Goals: Education classes will be provided on a weekly basis, covering required topics. Participant will state understanding/return demonstration of topics presented.  Learning Barriers/Preferences:     Learning Barriers/Preferences - 11/08/15 1059      Learning Barriers/Preferences   Learning Barriers None   Learning Preferences Audio;Group Instruction;Individual Instruction;Skilled Demonstration;Verbal Instruction      Education Topics: Risk Factor Reduction:  -Group instruction that is supported by a PowerPoint presentation. Instructor discusses the definition of a risk factor, different risk factors for pulmonary disease, and how the heart and lungs work together.     Nutrition for Pulmonary Patient:  -Group instruction provided by PowerPoint slides, verbal discussion, and written materials to support subject matter. The instructor gives an explanation and review of healthy diet recommendations, which includes a discussion on weight management, recommendations for fruit and vegetable consumption, as well as protein, fluid, caffeine, fiber, sodium, sugar, and alcohol. Tips for eating when patients are short of  breath are discussed.   Pursed Lip Breathing:  -Group instruction that is supported by demonstration and informational handouts. Instructor discusses the benefits of pursed lip and diaphragmatic breathing and detailed demonstration on how to preform both.   Flowsheet Row PULMONARY REHAB OTHER RESPIRATORY from 12/09/2015 in [facility]  Date  11/18/15  Educator  ep  Instruction Review Code  2- meets goals/outcomes      Oxygen Safety:  -Group instruction provided by PowerPoint, verbal discussion, and written material to support subject matter. There is an overview of "What is Oxygen" and "Why do we need it".  Instructor also reviews how to create a safe environment for oxygen use, the importance of using oxygen as prescribed, and the risks of noncompliance. There is a brief discussion on traveling with oxygen and resources the patient may utilize. Flowsheet Row PULMONARY REHAB OTHER RESPIRATORY from 12/09/2015 in [facility]  Date  12/09/15  Educator  RN  Instruction Review Code  2- meets goals/outcomes      Oxygen Equipment:  -Group instruction provided by [facility] Staff utilizing handouts, written materials, and equipment demonstrations.   Signs and Symptoms:  -Group instruction provided by written material and verbal discussion to support subject matter. Warning signs and symptoms of infection, stroke, and heart attack are reviewed and when to call the physician/911 reinforced. Tips for preventing the spread of infection discussed.   Advanced Directives:  -Group instruction provided by verbal instruction and written material to support subject matter. Instructor reviews Advanced Directive laws and proper instruction for filling out document.   Pulmonary Video:  -Group video education that reviews the importance of medication and oxygen compliance, exercise, good nutrition, pulmonary hygiene, and pursed lip and  diaphragmatic breathing for the pulmonary patient. Flowsheet Row PULMONARY REHAB OTHER RESPIRATORY from 12/09/2015 in [place]  Date  11/25/15  Educator  video  Instruction Review Code  2- meets goals/outcomes      Exercise for the Pulmonary Patient:  -Group instruction that is supported by a PowerPoint presentation. Instructor discusses benefits of exercise, core components of exercise, frequency, duration, and intensity of an exercise routine, importance of utilizing pulse oximetry during exercise, safety while exercising, and options of places to exercise outside of rehab.     Pulmonary Medications:  -Verbally interactive group education provided by instructor with focus on  inhaled medications and proper administration.   Anatomy and Physiology of the Respiratory System and Intimacy:  -Group instruction provided by PowerPoint, verbal discussion, and written material to support subject matter. Instructor reviews respiratory cycle and anatomical components of the respiratory system and their functions. Instructor also reviews differences in obstructive and restrictive respiratory diseases with examples of each. Intimacy, Sex, and Sexuality differences are reviewed with a discussion on how relationships can change when diagnosed with pulmonary disease. Common sexual concerns are reviewed.   Knowledge Questionnaire Score:     Knowledge Questionnaire Score - 11/16/15 1617      Knowledge Questionnaire Score   Pre Score 12/13      Core Components/Risk Factors/Patient Goals at Admission:     Personal Goals and Risk Factors at Admission - 11/08/15 1102      Core Components/Risk Factors/Patient Goals on Admission   Increase Strength and Stamina Yes   Intervention Provide advice, education, support and counseling about physical activity/exercise needs.;Develop an individualized exercise prescription for aerobic and resistive training based on initial  evaluation findings, risk stratification, comorbidities and participant's personal goals.   Expected Outcomes Achievement of increased cardiorespiratory fitness and enhanced flexibility, muscular endurance and strength shown through measurements of functional capacity and personal statement of participant.   Improve shortness of breath with ADL's Yes   Intervention Provide education, individualized exercise plan and daily activity instruction to help decrease symptoms of SOB with activities of daily living.   Expected Outcomes Short Term: Achieves a reduction of symptoms when performing activities of daily living.   Develop more efficient breathing techniques such as purse lipped breathing and diaphragmatic breathing; and practicing self-pacing with activity Yes   Intervention Provide education, demonstration and support about specific breathing techniuqes utilized for more efficient breathing. Include techniques such as pursed lipped breathing, diaphragmatic breathing and self-pacing activity.   Expected Outcomes Short Term: Participant will be able to demonstrate and use breathing techniques as needed throughout daily activities.      Core Components/Risk Factors/Patient Goals Review:      Goals and Risk Factor Review    Row Name 11/16/15 0655 12/14/15 0659           Core Components/Risk Factors/Patient Goals Review   Personal Goals Review Increase Strength and Stamina;Improve shortness of breath with ADL's;Develop more efficient breathing techniques such as purse lipped breathing and diaphragmatic breathing and practicing self-pacing with activity. Increase Strength and Stamina;Improve shortness of breath with ADL's;Develop more efficient breathing techniques such as purse lipped breathing and diaphragmatic breathing and practicing self-pacing with activity.      Review see "comments" section on ITP see "comments" section on ITP      Expected Outcomes see "Admission" expected outcomes see  "Admission" expected outcomes         Core Components/Risk Factors/Patient Goals at Discharge (Final Review):      Goals and Risk Factor Review - 12/14/15 0659      Core Components/Risk Factors/Patient Goals Review   Personal Goals Review Increase Strength and Stamina;Improve shortness of breath with ADL's;Develop more efficient breathing techniques such as purse lipped breathing and diaphragmatic breathing and practicing self-pacing with activity.   Review see "comments" section on ITP   Expected Outcomes see "Admission" expected outcomes      ITP Comments:   Comments: ITP REVIEW Pt is beginning to make expected progress toward pulmonary rehab goals after completing 8 sessions. He now has adequate home O2 which will allow him to complete his exercise at home. He  is tolerating workload increases with 10L O2 and a pendant. Recommend continued exercise, life style modification, education, and utilization of breathing techniques to increase stamina and strength and decrease shortness of breath with exertion.

## 2015-12-16 ENCOUNTER — Encounter (HOSPITAL_COMMUNITY)
Admission: RE | Admit: 2015-12-16 | Discharge: 2015-12-16 | Disposition: A | Discharge status: Home / Self Care | Payer: Medicare Other | Source: Ambulatory Visit | Attending: Pulmonary Disease | Admitting: Pulmonary Disease

## 2015-12-16 VITALS — Wt 183.4 lb

## 2015-12-16 DIAGNOSIS — J8410 Pulmonary fibrosis, unspecified: Secondary | ICD-10-CM

## 2015-12-16 NOTE — Progress Notes (Signed)
Daily Session Note  Patient Details  Name: Bradley Bennett MRN: [id number] Date of Birth: 07-30-1932 Referring Provider:   [name] Pulmonary Rehab Walk Test from 11/09/2015 in [place]  Referring Provider  Dr. Alva Garnet      Encounter Date: 12/16/2015  Check In:     Session Check In - 12/16/15 1030      Check-In   Location [facility]   Staff Present Rosebud Poles, RN, BSN;Molly diVincenzo, MS, ACSM RCEP, Exercise Physiologist;Lisa Ysidro Evert, Felipe Drone, RN, MHA;Alaijah Gibler Rollene Rotunda, RN, BSN   Supervising physician immediately available to respond to emergencies [facility] immediately available   Physician(s) Dr. Tana Coast   Medication changes reported     No   Fall or balance concerns reported    No   Warm-up and Cool-down Performed as group-led instruction   Resistance Training Performed Yes   VAD Patient? No     Pain Assessment   Currently in Pain? No/denies   Multiple Pain Sites No      Capillary Blood Glucose: No results found for this or any previous visit (from the past 24 hour(s)).      Exercise Prescription Changes - 12/16/15 1354      Response to Exercise   Blood Pressure (Admit) 100/60   Blood Pressure (Exercise) 118/64   Blood Pressure (Exit) 100/60   Heart Rate (Admit) 95 bpm   Heart Rate (Exercise) 110 bpm   Heart Rate (Exit) 92 bpm   Oxygen Saturation (Admit) 92 %   Oxygen Saturation (Exercise) 92 %   Oxygen Saturation (Exit) 95 %   Rating of Perceived Exertion (Exercise) 14   Perceived Dyspnea (Exercise) 2   Duration Progress to 45 minutes of aerobic exercise without signs/symptoms of physical distress   Intensity THRR unchanged     Progression   Progression Continue to progress workloads to maintain intensity without signs/symptoms of physical distress.     Resistance Training   Training Prescription Yes   Weight greeen bands   Reps 10-12  10 minutes of strength training     Interval Training   Interval Training No     Oxygen   Oxygen Continuous   Liters 8-10     NuStep   Level 3   Minutes 17   METs 1.6     Arm Ergometer   Level 3   Minutes 17     Home Exercise Plan   Plans to continue exercise at Home   Frequency Add 3 additional days to program exercise sessions.     Goals Met:  Improved SOB with ADL's Using PLB without cueing & demonstrates good technique Exercise tolerated well No report of cardiac concerns or symptoms Strength training completed today  Goals Unmet:  Not Applicable  Comments: Service time is from 1030 to 1230   Dr. Rush Farmer is Medical Director for Pulmonary Rehab at [facility].

## 2015-12-21 ENCOUNTER — Encounter (HOSPITAL_COMMUNITY)
Admission: RE | Admit: 2015-12-21 | Discharge: 2015-12-21 | Disposition: A | Discharge status: Home / Self Care | Payer: Medicare Other | Source: Ambulatory Visit | Attending: Pulmonary Disease | Admitting: Pulmonary Disease

## 2015-12-21 VITALS — Wt 187.6 lb

## 2015-12-21 DIAGNOSIS — J8410 Pulmonary fibrosis, unspecified: Secondary | ICD-10-CM | POA: Diagnosis not present

## 2015-12-21 NOTE — Progress Notes (Signed)
Daily Session Note  Patient Details  Name: Bradley Bennett MRN: [id number] Date of Birth: 09/08/1932 Referring Provider:   [name] Pulmonary Rehab Walk Test from 11/09/2015 in [place]  Referring Provider  Dr. Alva Garnet      Encounter Date: 12/21/2015  Check In:     Session Check In - 12/21/15 1215      Check-In   [facility]   Staff Present Rosebud Poles, RN, BSN;Molly diVincenzo, MS, ACSM RCEP, Exercise Physiologist;Lisa Ysidro Evert, Duffy Bruce, RN, BSN   Supervising physician immediately available to respond to emergencies [facility] immediately available   Physician(s) Dr. Tana Coast   Medication changes reported     No   Fall or balance concerns reported    No   Warm-up and Cool-down Performed as group-led instruction   Resistance Training Performed Yes   VAD Patient? No     Pain Assessment   Currently in Pain? No/denies   Multiple Pain Sites No      Capillary Blood Glucose: No results found for this or any previous visit (from the past 24 hour(s)).      Exercise Prescription Changes - 12/21/15 1200      Exercise Review   Progression Yes     Response to Exercise   Blood Pressure (Admit) 120/64   Blood Pressure (Exercise) 126/70   Blood Pressure (Exit) 106/64   Heart Rate (Admit) 104 bpm   Heart Rate (Exercise) 114 bpm   Heart Rate (Exit) 95 bpm   Oxygen Saturation (Admit) 90 %   Oxygen Saturation (Exercise) 91 %   Oxygen Saturation (Exit) 97 %   Rating of Perceived Exertion (Exercise) 12   Perceived Dyspnea (Exercise) 1   Duration Progress to 45 minutes of aerobic exercise without signs/symptoms of physical distress   Intensity THRR unchanged     Progression   Progression Continue to progress workloads to maintain intensity without signs/symptoms of physical distress.     Resistance Training   Training Prescription Yes   Weight greeen bands   Reps 10-12  10 minutes of strength  training     Interval Training   Interval Training No     NuStep   Level 3   Minutes 17   METs 1.6     Arm Ergometer   Level 3   Minutes 17     Track   Laps 8   Minutes 17     Goals Met:  Exercise tolerated well Strength training completed today  Goals Unmet:  Not Applicable  Comments: Service time is from 1030 to 1205    Dr. Rush Farmer is Medical Director for Pulmonary Rehab at [facility].

## 2015-12-22 ENCOUNTER — Ambulatory Visit: Payer: Medicare Other | Admitting: Cardiology

## 2015-12-23 ENCOUNTER — Encounter (HOSPITAL_COMMUNITY)
Admission: RE | Admit: 2015-12-23 | Discharge: 2015-12-23 | Disposition: A | Discharge status: Home / Self Care | Payer: Medicare Other | Source: Ambulatory Visit | Attending: Pulmonary Disease | Admitting: Pulmonary Disease

## 2015-12-23 DIAGNOSIS — J8410 Pulmonary fibrosis, unspecified: Secondary | ICD-10-CM

## 2015-12-23 NOTE — Progress Notes (Signed)
Daily Session Note  Patient Details  Name: Bradley Bennett MRN: [id number] Date of Birth: May 23, 1932 Referring Provider:   [name] Pulmonary Rehab Walk Test from 11/09/2015 in [place]  Referring Provider  Dr. Alva Garnet      Encounter Date: 12/23/2015  Check In:     Session Check In - 12/23/15 1044      Check-In   [facility]   Staff Present Rosebud Poles, RN, BSN;Liyat Faulkenberry, MS, ACSM RCEP, Exercise Physiologist;Lisa Ysidro Evert, RN;Portia Rollene Rotunda, RN, BSN   Supervising physician immediately available to respond to emergencies [facility] immediately available   Physician(s) Dr. Grandville Silos   Medication changes reported     No   Fall or balance concerns reported    No   Warm-up and Cool-down Performed as group-led instruction   Resistance Training Performed Yes   VAD Patient? No     Pain Assessment   Currently in Pain? No/denies   Multiple Pain Sites No      Capillary Blood Glucose: No results found for this or any previous visit (from the past 24 hour(s)).      Exercise Prescription Changes - 12/23/15 1200      Response to Exercise   Blood Pressure (Admit) 98/50   Blood Pressure (Exercise) 100/60   Blood Pressure (Exit) 100/60   Heart Rate (Admit) 94 bpm   Heart Rate (Exercise) 102 bpm   Heart Rate (Exit) 93 bpm   Oxygen Saturation (Admit) 99 %   Oxygen Saturation (Exercise) 88 %   Oxygen Saturation (Exit) 98 %   Rating of Perceived Exertion (Exercise) 11   Perceived Dyspnea (Exercise) 2   Duration Progress to 45 minutes of aerobic exercise without signs/symptoms of physical distress   Intensity THRR unchanged     Progression   Progression Continue to progress workloads to maintain intensity without signs/symptoms of physical distress.     Resistance Training   Training Prescription Yes   Weight greeen bands   Reps 10-12  10 minutes of strength training     Interval Training   Interval  Training No     Arm Ergometer   Level 4   Minutes 17     Track   Laps 8   Minutes 17     Goals Met:  Exercise tolerated well No report of cardiac concerns or symptoms Strength training completed today  Goals Unmet:  Not Applicable  Comments: Service time is from 10:30am to 12:20pm    Dr. Rush Farmer is Medical Director for Pulmonary Rehab at [facility].

## 2015-12-28 ENCOUNTER — Telehealth (HOSPITAL_COMMUNITY): Payer: Self-pay | Admitting: Internal Medicine

## 2015-12-28 ENCOUNTER — Encounter (HOSPITAL_COMMUNITY): Payer: Medicare Other

## 2015-12-30 ENCOUNTER — Encounter (HOSPITAL_COMMUNITY): Payer: Medicare Other

## 2016-01-04 ENCOUNTER — Other Ambulatory Visit: Payer: Self-pay | Admitting: Pulmonary Disease

## 2016-01-04 ENCOUNTER — Encounter (HOSPITAL_COMMUNITY)
Admission: RE | Admit: 2016-01-04 | Discharge: 2016-01-04 | Disposition: A | Discharge status: Home / Self Care | Payer: Medicare Other | Source: Ambulatory Visit | Attending: Pulmonary Disease | Admitting: Pulmonary Disease

## 2016-01-04 ENCOUNTER — Ambulatory Visit (INDEPENDENT_AMBULATORY_CARE_PROVIDER_SITE_OTHER): Payer: Medicare Other | Admitting: Pulmonary Disease

## 2016-01-04 ENCOUNTER — Encounter: Payer: Self-pay | Admitting: Pulmonary Disease

## 2016-01-04 VITALS — Wt 185.4 lb

## 2016-01-04 VITALS — BP 124/62 | HR 104 | Ht 71.0 in | Wt 186.0 lb

## 2016-01-04 DIAGNOSIS — R1310 Dysphagia, unspecified: Secondary | ICD-10-CM | POA: Diagnosis not present

## 2016-01-04 DIAGNOSIS — I2510 Atherosclerotic heart disease of native coronary artery without angina pectoris: Secondary | ICD-10-CM

## 2016-01-04 DIAGNOSIS — J8410 Pulmonary fibrosis, unspecified: Secondary | ICD-10-CM

## 2016-01-04 NOTE — Patient Instructions (Signed)
For your pulmonary fibrosis: We will arrange for a lung function test  For your dysphasia: We will arrange for a modified barium swallow  For your chronic respiratory failure with hypoxemia: You need to use 6 L of continuous flow oxygen at rest, up to 10 with exertion We will contact a local DME company to see if there is a concentrator that can supply this amount of oxygen. In the meantime he needs to use the oxygen tanks in order to facilitate this much oxygen.  For your coronary artery disease: Follow-up with Dr. Radford Pax If the left arm pain accelerates, worsens or becomes more frequent then go to the emergency room  We'll see you back in 2 months or sooner if needed

## 2016-01-04 NOTE — Progress Notes (Signed)
Daily Session Note  Patient Details  Name: Bradley Bennett MRN: [id number] Date of Birth: 1932/08/05 Referring Provider:   [name] Pulmonary Rehab Walk Test from 11/09/2015 in [place]  Referring Provider  Dr. Alva Garnet      Encounter Date: 01/04/2016  Check In:     Session Check In - 01/04/16 1027      Check-In   [facility]   Staff Present Rosebud Poles, RN, BSN;Molly diVincenzo, MS, ACSM RCEP, Exercise Physiologist;Yalitza Teed Ysidro Evert, RN;Portia Rollene Rotunda, RN, BSN   Supervising physician immediately available to respond to emergencies [facility] immediately available   Physician(s) Dr. Cathlean Sauer   Medication changes reported     No   Fall or balance concerns reported    No   Warm-up and Cool-down Performed as group-led instruction   Resistance Training Performed Yes   VAD Patient? No     Pain Assessment   Currently in Pain? No/denies   Multiple Pain Sites No      Capillary Blood Glucose: No results found for this or any previous visit (from the past 24 hour(s)).      Exercise Prescription Changes - 01/04/16 1200      Response to Exercise   Blood Pressure (Admit) 114/58   Blood Pressure (Exercise) 126/70   Blood Pressure (Exit) 123/75   Heart Rate (Admit) 78 bpm   Heart Rate (Exercise) 101 bpm   Heart Rate (Exit) 88 bpm   Oxygen Saturation (Admit) 100 %   Oxygen Saturation (Exercise) 92 %   Oxygen Saturation (Exit) 99 %   Rating of Perceived Exertion (Exercise) 15   Perceived Dyspnea (Exercise) 1   Duration Progress to 45 minutes of aerobic exercise without signs/symptoms of physical distress   Intensity THRR unchanged     Progression   Progression Continue to progress workloads to maintain intensity without signs/symptoms of physical distress.     Resistance Training   Training Prescription Yes   Weight green bands   Reps 10-12  10 minutes of strength training     Interval Training   Interval  Training No     Oxygen   Oxygen Continuous   Liters 8-10     NuStep   Level 4   Minutes 17   METs 1.7     Arm Ergometer   Level 4   Minutes 17     Track   Laps 9   Minutes 17     Goals Met:  Exercise tolerated well No report of cardiac concerns or symptoms Strength training completed today  Goals Unmet:  Not Applicable  Comments: Service time is from 1030 to 1200    Dr. Rush Farmer is Medical Director for Pulmonary Rehab at [facility]

## 2016-01-04 NOTE — Progress Notes (Signed)
Subjective:    Patient ID: Bradley Bennett, male    DOB: [birth year], 82 y.o.   MRN: [id number]  Synopsis: On her fibrosis and possible COPD with a history of chronic aspiration in the setting of Parkinson's disease. Uses 3-4 L of oxygen continuously.  He was started on oxygen around 2013 after his diagnosis of COPD in 2013.  He was diagnosed with an ILD around 7 in [place]. A modified barium swallow in 2015 showed aspiration.  He smoked 35 years, 2 ppd, quit around 1992.   HPI Chief Complaint  Patient presents with  . Follow-up    switching from DS to BQ d/t location.  pt currently in pulm rehab.  Pt wears 6-10lpm.    Bradley Bennett and his daughter come to see me today for evaluation of his pulmonary fibrosis and chronic respiratory failure with hypoxemia.   See details of his prior diagnosis as I have detailed above.    He doubts a change in shortness of breath but his daughter disagrees with this assessment.  He can walk in a supermarket from the grocery store but then he uses a motorized cart.  His daughter notes that he can't walk much more than 100 feet now without stopping.  He feels that his swallowing is worse because he has more chest congestion while eating.  He has more trouble with congestion while lying flat.  He doesn't thicken any of his liquids.  He doesn't change the consistency of his solid foods.    He had a heart attack in 1992 and has had stents put in since then.  However he says that he has been having more trouble with left arm pain in the last few days.  He is supposed to see Dr. Radford Bennett next week.    Past Medical History:  Diagnosis Date  . Arthritis   . Asthma   . Coronary artery disease   . Emphysema of lung ([place])   . Hearing difficulty of both ears   . Heart attack   . Heart murmur   . Hyperlipidemia   . Hypertension   . ILD (interstitial lung disease) ([place])   . Parkinson disease ([place])      Family History  Problem Relation Age of Onset  . Colon  cancer Father   . Stroke Father   . Arthritis Mother   . Arthritis Sister   . Heart disease Brother   . Kidney cancer Brother      Social History   Social History  . Marital status: Divorced    Spouse name: N/A  . Number of children: N/A  . Years of education: N/A   Occupational History  . retired     [place] History Main Topics  . Smoking status: Former Smoker    Packs/day: 2.00    Years: 35.00    Quit date: 08/15/1985  . Smokeless tobacco: Never Used     Comment: quit smoking in 1992  . Alcohol use 0.0 oz/week     Comment: twice every 6 months  . Drug use: No  . Sexual activity: Not on file   Other Topics Concern  . Not on file   Social History Narrative  . No narrative on file     Allergies  Allergen Reactions  . Isosorbide      Outpatient Medications Prior to Visit  Medication Sig Dispense Refill  . acetaminophen (TYLENOL) 500 MG tablet Take 500 mg by mouth as needed.    Marland Kitchen  ALPRAZolam (XANAX) 0.25 MG tablet Take 0.25 mg by mouth at bedtime as needed for anxiety.    Marland Kitchen atorvastatin (LIPITOR) 10 MG tablet Take 10 mg by mouth daily.    . busPIRone (BUSPAR) 5 MG tablet Take 1 tablet (5 mg total) by mouth daily. 90 tablet 1  . carbidopa-levodopa (SINEMET CR) 50-200 MG tablet Take 1 tablet by mouth at bedtime. 90 tablet 1  . carbidopa-levodopa (SINEMET IR) 25-100 MG tablet Take 2 tablets by mouth 3 (three) times daily. 540 tablet 1  . clopidogrel (PLAVIX) 75 MG tablet Take 75 mg by mouth daily.    . finasteride (PROSCAR) 5 MG tablet Take 1 tablet (5 mg total) by mouth daily. 90 tablet 1  . Fluticasone-Salmeterol (ADVAIR DISKUS) 500-50 MCG/DOSE AEPB Inhale 1 puff into the lungs 2 (two) times daily.    Marland Kitchen levothyroxine (SYNTHROID, LEVOTHROID) 150 MCG tablet Take 1 tablet (150 mcg total) by mouth daily. 90 tablet 3  . Multiple Vitamins-Minerals (CENTRUM VITAMINTS PO) Take 1 tablet by mouth daily.     . tamsulosin (FLOMAX) 0.4 MG CAPS capsule Take 0.4 mg by mouth  daily.     . traZODone (DESYREL) 50 MG tablet Take 1 tablet (50 mg total) by mouth at bedtime. 90 tablet 1  . umeclidinium bromide (INCRUSE ELLIPTA) 62.5 MCG/INH AEPB Inhale 1 puff into the lungs daily.     No facility-administered medications prior to visit.       Review of Systems  Constitutional: Negative for chills, fatigue and fever.  HENT: Negative for postnasal drip, rhinorrhea, sinus pain and sinus pressure.   Respiratory: Positive for cough and shortness of breath. Negative for wheezing.   Cardiovascular: Negative for chest pain, palpitations and leg swelling.  Gastrointestinal: Negative for abdominal distention, constipation, diarrhea and nausea.  Musculoskeletal: Negative for arthralgias, gait problem, joint swelling and neck stiffness.  Neurological: Negative for seizures, speech difficulty, light-headedness, numbness and headaches.       Objective:   Physical Exam Vitals:   01/04/16 1540  BP: 124/62  Pulse: (!) 104  SpO2: 91%  Weight: 186 lb (84.4 kg)  Height: 5\' 11"  (1.803 m)   RA  Gen: chronically ill appearing, no acute distress HENT: NCAT, OP clear, neck supple without masses Eyes: PERRL, EOMi Lymph: no cervical lymphadenopathy PULM: Crackles bases bilaterally B, no wheezing CV: RRR, no mgr, no JVD GI: BS+, soft, nontender, no hsm Derm: no rash or skin breakdown MSK: normal bulk and tone Neuro: A&Ox4, CN II-XII intact, strength 5/5 in all 4 extremities Psyche: normal mood and affect  Records from Dr. Jamal Bennett reviewed where he was seen for pulmonary fibrosis.  Chest x-ray images personally reviewed from 2017 showing by basilar fibrotic changes as well as cysts versus bullous disease      Assessment & Plan:  Impression: Pulmonary fibrosis COPD Chronic respiratory failure with Hypoxemia Dysphagia Parkinson's disease  Discussion: Bradley Bennett has chronic fibrosis related to dysphagia and aspiration. He also has chronic respiratory failure with  hypoxemia which appears to be worsening secondary to the same. He currently needs to be using more oxygen. I explained to him today at length that walking around with low oxygen levels puts a significant amount of strain on his heart. Currently he is using an oxygen concentrator that is not supply adequate oxygen. He will need to use tanks until we can find a concentrator which can supply up to 10 L of oxygen continuously.  For your pulmonary fibrosis: We will arrange for a lung  function test  For your dysphasia: We will arrange for a modified barium swallow  For your chronic respiratory failure with hypoxemia: You need to use 6 L of continuous flow oxygen at rest, up to 10 with exertion We will contact a local DME company to see if there is a concentrator that can supply this amount of oxygen. In the meantime he needs to use the oxygen tanks in order to facilitate this much oxygen.  For your coronary artery disease: Follow-up with Dr. Radford Bennett If the left arm pain accelerates, worsens or becomes more frequent then go to the emergency room  We'll see you back in 2 months or sooner if needed  Current Outpatient Prescriptions:  .  acetaminophen (TYLENOL) 500 MG tablet, Take 500 mg by mouth as needed., Disp: , Rfl:  .  ALPRAZolam (XANAX) 0.25 MG tablet, Take 0.25 mg by mouth at bedtime as needed for anxiety., Disp: , Rfl:  .  atorvastatin (LIPITOR) 10 MG tablet, Take 10 mg by mouth daily., Disp: , Rfl:  .  busPIRone (BUSPAR) 5 MG tablet, Take 1 tablet (5 mg total) by mouth daily., Disp: 90 tablet, Rfl: 1 .  carbidopa-levodopa (SINEMET CR) 50-200 MG tablet, Take 1 tablet by mouth at bedtime., Disp: 90 tablet, Rfl: 1 .  carbidopa-levodopa (SINEMET IR) 25-100 MG tablet, Take 2 tablets by mouth 3 (three) times daily., Disp: 540 tablet, Rfl: 1 .  clopidogrel (PLAVIX) 75 MG tablet, Take 75 mg by mouth daily., Disp: , Rfl:  .  finasteride (PROSCAR) 5 MG tablet, Take 1 tablet (5 mg total) by mouth  daily., Disp: 90 tablet, Rfl: 1 .  Fluticasone-Salmeterol (ADVAIR DISKUS) 500-50 MCG/DOSE AEPB, Inhale 1 puff into the lungs 2 (two) times daily., Disp: , Rfl:  .  levothyroxine (SYNTHROID, LEVOTHROID) 150 MCG tablet, Take 1 tablet (150 mcg total) by mouth daily., Disp: 90 tablet, Rfl: 3 .  mirabegron ER (MYRBETRIQ) 25 MG TB24 tablet, Take 25 mg by mouth daily., Disp: , Rfl:  .  Multiple Vitamins-Minerals (CENTRUM VITAMINTS PO), Take 1 tablet by mouth daily. , Disp: , Rfl:  .  tamsulosin (FLOMAX) 0.4 MG CAPS capsule, Take 0.4 mg by mouth daily. , Disp: , Rfl:  .  traZODone (DESYREL) 50 MG tablet, Take 1 tablet (50 mg total) by mouth at bedtime., Disp: 90 tablet, Rfl: 1 .  umeclidinium bromide (INCRUSE ELLIPTA) 62.5 MCG/INH AEPB, Inhale 1 puff into the lungs daily., Disp: , Rfl:

## 2016-01-05 ENCOUNTER — Ambulatory Visit: Payer: Medicare Other | Admitting: Cardiology

## 2016-01-05 ENCOUNTER — Other Ambulatory Visit (HOSPITAL_COMMUNITY): Payer: Self-pay | Admitting: Pulmonary Disease

## 2016-01-05 DIAGNOSIS — R1319 Other dysphagia: Secondary | ICD-10-CM

## 2016-01-06 ENCOUNTER — Encounter (HOSPITAL_COMMUNITY)
Admission: RE | Admit: 2016-01-06 | Discharge: 2016-01-06 | Disposition: A | Discharge status: Home / Self Care | Payer: Medicare Other | Source: Ambulatory Visit | Attending: Pulmonary Disease | Admitting: Pulmonary Disease

## 2016-01-06 VITALS — Wt 186.9 lb

## 2016-01-06 DIAGNOSIS — J8410 Pulmonary fibrosis, unspecified: Secondary | ICD-10-CM | POA: Diagnosis not present

## 2016-01-06 NOTE — Progress Notes (Signed)
Daily Session Note  Patient Details  Name: Bradley Bennett MRN: [id number] Date of Birth: 1932/09/23 Referring Provider:   [name] Pulmonary Rehab Walk Test from 11/09/2015 in [place]  Referring Provider  Dr. Alva Garnet      Encounter Date: 01/06/2016  Check In:     Session Check In - 01/06/16 1020      Check-In   Location [facility]   Staff Present Su Hilt, MS, ACSM RCEP, Exercise Physiologist;Portia Rollene Rotunda, RN, Roque Cash, RN   Supervising physician immediately available to respond to emergencies [facility] immediately available   Physician(s) Dr. Ree Kida   Medication changes reported     No   Fall or balance concerns reported    No   Warm-up and Cool-down Performed as group-led instruction   Resistance Training Performed Yes   VAD Patient? No     Pain Assessment   Currently in Pain? No/denies   Multiple Pain Sites No      Capillary Blood Glucose: No results found for this or any previous visit (from the past 24 hour(s)).      Exercise Prescription Changes - 01/06/16 1200      Response to Exercise   Blood Pressure (Admit) 126/64   Blood Pressure (Exercise) 142/86   Blood Pressure (Exit) 112/76   Heart Rate (Admit) 89 bpm   Heart Rate (Exercise) 108 bpm   Heart Rate (Exit) 88 bpm   Oxygen Saturation (Admit) 99 %   Oxygen Saturation (Exercise) 89 %   Oxygen Saturation (Exit) 99 %   Rating of Perceived Exertion (Exercise) 12   Perceived Dyspnea (Exercise) 1   Duration Progress to 45 minutes of aerobic exercise without signs/symptoms of physical distress   Intensity THRR unchanged     Progression   Progression Continue to progress workloads to maintain intensity without signs/symptoms of physical distress.     Resistance Training   Training Prescription Yes   Weight green bands   Reps 10-12  10 minutes of strength training     Interval Training   Interval Training No     Oxygen    Oxygen Continuous   Liters 10     NuStep   Level 4   Minutes 17   METs 1.7     Track   Laps 8   Minutes 17     Goals Met:  Exercise tolerated well No report of cardiac concerns or symptoms Strength training completed today  Goals Unmet:  Not Applicable  Comments: Service time is from 1030 to 1150    Dr. Rush Farmer is Medical Director for Pulmonary Rehab at [facility].

## 2016-01-11 ENCOUNTER — Ambulatory Visit (HOSPITAL_COMMUNITY)
Admission: RE | Admit: 2016-01-11 | Discharge: 2016-01-11 | Disposition: A | Discharge status: Home / Self Care | Payer: Medicare Other | Source: Ambulatory Visit | Attending: Pulmonary Disease | Admitting: Pulmonary Disease

## 2016-01-11 ENCOUNTER — Encounter (HOSPITAL_COMMUNITY)
Admission: RE | Admit: 2016-01-11 | Discharge: 2016-01-11 | Disposition: A | Discharge status: Home / Self Care | Payer: Medicare Other | Source: Ambulatory Visit | Attending: Pulmonary Disease | Admitting: Pulmonary Disease

## 2016-01-11 ENCOUNTER — Ambulatory Visit (HOSPITAL_COMMUNITY): Payer: Medicare Other

## 2016-01-11 DIAGNOSIS — R1319 Other dysphagia: Secondary | ICD-10-CM

## 2016-01-11 DIAGNOSIS — G20 Parkinson's disease: Secondary | ICD-10-CM | POA: Diagnosis not present

## 2016-01-11 DIAGNOSIS — R942 Abnormal results of pulmonary function studies: Secondary | ICD-10-CM | POA: Insufficient documentation

## 2016-01-11 DIAGNOSIS — J8410 Pulmonary fibrosis, unspecified: Secondary | ICD-10-CM | POA: Insufficient documentation

## 2016-01-11 DIAGNOSIS — R1310 Dysphagia, unspecified: Secondary | ICD-10-CM

## 2016-01-11 DIAGNOSIS — Z87891 Personal history of nicotine dependence: Secondary | ICD-10-CM | POA: Diagnosis not present

## 2016-01-11 LAB — PULMONARY FUNCTION TEST
DL/VA % PRED: 24 %
DL/VA: 1.12 ml/min/mmHg/L
DLCO UNC: 6.75 ml/min/mmHg
DLCO unc % pred: 20 %
FEF 25-75 POST: 2.31 L/s
FEF 25-75 Pre: 2.68 L/sec
FEF2575-%Change-Post: -13 %
FEF2575-%Pred-Post: 119 %
FEF2575-%Pred-Pre: 138 %
FEV1-%CHANGE-POST: -4 %
FEV1-%PRED-PRE: 114 %
FEV1-%Pred-Post: 109 %
FEV1-POST: 3.14 L
FEV1-PRE: 3.28 L
FEV1FVC-%Change-Post: -3 %
FEV1FVC-%Pred-Pre: 105 %
FEV6-%Change-Post: -2 %
FEV6-%PRED-POST: 111 %
FEV6-%PRED-PRE: 114 %
FEV6-POST: 4.22 L
FEV6-PRE: 4.33 L
FEV6FVC-%CHANGE-POST: 0 %
FEV6FVC-%PRED-POST: 106 %
FEV6FVC-%PRED-PRE: 106 %
FVC-%Change-Post: 0 %
FVC-%PRED-PRE: 108 %
FVC-%Pred-Post: 107 %
FVC-POST: 4.37 L
FVC-PRE: 4.39 L
POST FEV6/FVC RATIO: 99 %
PRE FEV6/FVC RATIO: 99 %
Post FEV1/FVC ratio: 72 %
Pre FEV1/FVC ratio: 75 %
RV % PRED: 130 %
RV: 3.56 L
TLC % PRED: 119 %
TLC: 8.55 L

## 2016-01-11 MED ORDER — ALBUTEROL SULFATE (2.5 MG/3ML) 0.083% IN NEBU
2.5000 mg | INHALATION_SOLUTION | Freq: Once | RESPIRATORY_TRACT | Status: AC
Start: 2016-01-11 — End: 2016-01-11
  Administered 2016-01-11: 2.5 mg via RESPIRATORY_TRACT

## 2016-01-11 NOTE — Progress Notes (Signed)
Pulmonary Individual Treatment Plan  Patient Details  Name: Bradley Bennett MRN: [id number] Date of Birth: [birth year] Referring Provider:   [name] Pulmonary Rehab Walk Test from 11/09/2015 in [place]  Referring Provider  Dr. Alva Garnet      Initial Encounter Date:  Flowsheet Row Pulmonary Rehab Walk Test from 11/09/2015 in [place]  Date  11/11/15  Referring Provider  Dr. 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  acetaminophen (TYLENOL) 500 MG tablet, Take 500 mg by mouth as needed., Disp: , Rfl:  .  ALPRAZolam (XANAX) 0.25 MG tablet, Take 0.25 mg by mouth at bedtime as needed for anxiety., Disp: , Rfl:  .  atorvastatin (LIPITOR) 10 MG tablet, Take 10 mg by mouth daily., Disp: , Rfl:  .  busPIRone (BUSPAR) 5 MG tablet, Take 1 tablet (5 mg total) by mouth daily., Disp: 90 tablet, Rfl: 1 .  carbidopa-levodopa (SINEMET CR) 50-200 MG tablet, Take 1 tablet by mouth at bedtime., Disp: 90 tablet, Rfl: 1 .  carbidopa-levodopa (SINEMET IR) 25-100 MG tablet, Take 2 tablets by mouth 3 (three) times daily., Disp: 540 tablet, Rfl: 1 .  clopidogrel (PLAVIX) 75 MG tablet, Take 75 mg by mouth daily., Disp: , Rfl:  .  finasteride (PROSCAR) 5 MG tablet, Take 1 tablet (5 mg total) by mouth daily., Disp: 90 tablet, Rfl: 1 .  Fluticasone-Salmeterol (ADVAIR DISKUS) 500-50 MCG/DOSE AEPB, Inhale 1 puff into the lungs 2 (two) times daily., Disp: , Rfl:  .  levothyroxine (SYNTHROID, LEVOTHROID) 150 MCG tablet, Take 1 tablet (150 mcg total) by mouth daily., Disp: 90 tablet, Rfl: 3 .  mirabegron ER (MYRBETRIQ) 25 MG TB24 tablet, Take 25 mg by mouth daily., Disp: , Rfl:  .  Multiple Vitamins-Minerals (CENTRUM VITAMINTS PO), Take 1 tablet by mouth daily. , Disp: , Rfl:  .  tamsulosin (FLOMAX) 0.4 MG CAPS capsule, Take 0.4 mg by  mouth daily. , Disp: , Rfl:  .  traZODone (DESYREL) 50 MG tablet, Take 1 tablet (50 mg total) by mouth at bedtime., Disp: 90 tablet, Rfl: 1 .  umeclidinium bromide (INCRUSE ELLIPTA) 62.5 MCG/INH AEPB, Inhale 1 puff into the lungs daily., Disp: , Rfl:   Past Medical History: Past Medical History:  Diagnosis Date  . Arthritis   . Asthma   . Coronary artery disease   . Emphysema of lung ([place])   . Hearing difficulty of both ears   . Heart attack   . Heart murmur   . Hyperlipidemia   . Hypertension   . ILD (interstitial lung disease) ([place])   . Parkinson disease ([place])     Tobacco Use: History  Smoking Status  . Former Smoker  . Packs/day: 2.00  . Years: 35.00  . Quit date: 08/15/1985  Smokeless Tobacco  . Never Used    Comment: quit smoking in 1992    Labs: Recent Review Flowsheet Data    Labs for ITP Cardiac and Pulmonary Rehab Latest Ref Rng & Units 07/23/2015   Cholestrol 0 - 200 mg/dL 172   LDLCALC 0 - 99 mg/dL 92   HDL >39.00 mg/dL 57.60   Trlycerides 0.0 - 149.0 mg/dL 108.0   Hemoglobin A1c 4.6 - 6.5 % 5.7      Capillary Blood Glucose: No results found for: GLUCAP   ADL UCSD:     Pulmonary Assessment Scores  [place] Name 11/16/15 1617         ADL UCSD   ADL Phase Entry     SOB Score total 86        Pulmonary Function Assessment:     Pulmonary Function Assessment - 11/08/15 1100      Breath   Bilateral Breath Sounds Clear   Shortness of Breath Yes      Exercise Target Goals:    Exercise Program Goal: Individual exercise prescription set with THRR, safety & activity barriers. Participant demonstrates ability to understand and report RPE using BORG scale, to self-measure pulse accurately, and to acknowledge the importance of the exercise prescription.  Exercise Prescription Goal: Starting with aerobic activity 30 plus minutes a day, 3 days per week for initial exercise prescription. Provide home exercise prescription and guidelines that  participant acknowledges understanding prior to discharge.  Activity Barriers & Risk Stratification:     Activity Barriers & Cardiac Risk Stratification - 11/08/15 1059      Activity Barriers & Cardiac Risk Stratification   Activity Barriers Arthritis      6 Minute Walk:     6 Minute Walk    Row Name 11/11/15 0656         6 Minute Walk   Phase Initial     Distance 825 feet     Walk Time 4.55 minutes  4 minutes and 55 seconds     # of Rest Breaks 2  first rest break: 45 seconds Second Break: 20 seconds     MPH 1.56     METS 2.23     RPE 11     Perceived Dyspnea  1     Symptoms No     Resting HR 101 bpm     Resting BP 100/70     Max Ex. HR 122 bpm     Max Ex. BP 102/74       Interval HR   Baseline HR 101     1 Minute HR 102     2 Minute HR 103     3 Minute HR 115     4 Minute HR 107     5 Minute HR 122     6 Minute HR 120     2 Minute Post HR 113     Interval Heart Rate? Yes       Interval Oxygen   Interval Oxygen? Yes     Baseline Oxygen Saturation % 93 %     Baseline Liters of Oxygen 4 L     1 Minute Oxygen Saturation % 88 %     1 Minute Liters of Oxygen 4 L     2 Minute Oxygen Saturation % 81 %     2 Minute Liters of Oxygen 4 L     3 Minute Oxygen Saturation % 87 %     3 Minute Liters of Oxygen 8 L     4 Minute Oxygen Saturation % 81 %     4 Minute Liters of Oxygen 8 L     5 Minute Oxygen Saturation % 85 %     5 Minute Liters of Oxygen 8 L     6 Minute Oxygen Saturation % 91 %     6 Minute Liters of Oxygen 8 L        Initial Exercise Prescription:     Initial Exercise Prescription - 11/11/15 0600      Date of Initial Exercise RX and Referring Provider  Date 11/11/15   Referring Provider Dr. Alva Garnet     Oxygen   Oxygen Continuous   Liters 6-8     NuStep   Level 2   Minutes 17   METs 1.5     Arm Ergometer   Level 2   Minutes 17     Track   Laps 4   Minutes 17     Prescription Details   Frequency (times per week) 2    Duration Progress to 45 minutes of aerobic exercise without signs/symptoms of physical distress     Intensity   THRR 40-80% of Max Heartrate 56-111   Ratings of Perceived Exertion 11-13   Perceived Dyspnea 0-4     Progression   Progression Continue progressive overload as per policy without signs/symptoms or physical distress.     Resistance Training   Training Prescription Yes   Weight GREEN BANDS   Reps 10-12      Perform Capillary Blood Glucose checks as needed.  Exercise Prescription Changes:     Exercise Prescription Changes    Row Name 11/11/15 1200 11/16/15 1200 11/18/15 1200 11/23/15 1200 11/25/15 1200     Response to Exercise   Blood Pressure (Admit) 112/72 114/70 110/56 110/62 104/56   Blood Pressure (Exercise) 150/80 130/66 118/80 134/64 132/72   Blood Pressure (Exit) 100/60 102/60 114/60 102/64 104/60   Heart Rate (Admit) 77 bpm 93 bpm 94 bpm 90 bpm 86 bpm   Heart Rate (Exercise) 114 bpm 111 bpm 90 bpm 83 bpm 91 bpm   Heart Rate (Exit) 88 bpm 90 bpm 92 bpm 83 bpm 75 bpm   Oxygen Saturation (Admit) 98 % 95 % 97 % 99 % 95 %   Oxygen Saturation (Exercise) 86 % 82 % 94 % 82 % 94 %   Oxygen Saturation (Exit) 99 % 97 % 97 % 100 % 95 %   Rating of Perceived Exertion (Exercise) 12 13 11 13 12    Perceived Dyspnea (Exercise) 1 1 1 2 1    Duration Progress to 45 minutes of aerobic exercise without signs/symptoms of physical distress Progress to 45 minutes of aerobic exercise without signs/symptoms of physical distress Progress to 45 minutes of aerobic exercise without signs/symptoms of physical distress Progress to 45 minutes of aerobic exercise without signs/symptoms of physical distress Progress to 45 minutes of aerobic exercise without signs/symptoms of physical distress   Intensity -  40-80% of THRR -  40-80% of THRR -  40-80% of THRR THRR unchanged THRR unchanged     Progression   Progression Continue to progress workloads to maintain intensity without signs/symptoms  of physical distress. Continue to progress workloads to maintain intensity without signs/symptoms of physical distress. Continue to progress workloads to maintain intensity without signs/symptoms of physical distress. Continue to progress workloads to maintain intensity without signs/symptoms of physical distress. Continue to progress workloads to maintain intensity without signs/symptoms of physical distress.     Resistance Training   Training Prescription Yes Yes Yes Yes Yes   Weight green bands green bands green bands green bands green bands   Reps -  10 minutes of strength training -  10 minutes of strength training -  10 minutes of strength training 10-12  10 minutes of strength training 10-12  10 minutes of strength training     Interval Training   Interval Training  -  -  - No No     Oxygen   Oxygen Continuous Continuous Continuous Continuous Continuous   Liters 8  8 8 8 8      NuStep   Level 2 4 4 2 2    Minutes 17 17 17 17 17    METs  - 2 1.8 1.6 1.7     Arm Ergometer   Level  - 2 2 2 2    Minutes  - 17 17 17 17      Track   Laps 8 8  - 11  -   Minutes 17 17  - 17  -   Row Name 11/30/15 1226 12/02/15 1324 12/09/15 1200 12/14/15 1200 12/16/15 1354     Exercise Review   Progression Yes  -  -  -  -     Response to Exercise   Blood Pressure (Admit) 118/60 82/60 121/72 122/60 100/60   Blood Pressure (Exercise) 130/70 140/74 136/70 124/64 118/64   Blood Pressure (Exit) 122/68 98/54 112/60 106/60 100/60   Heart Rate (Admit) 91 bpm 78 bpm 89 bpm 94 bpm 95 bpm   Heart Rate (Exercise) 105 bpm 85 bpm 107 bpm 112 bpm 110 bpm   Heart Rate (Exit) 93 bpm 75 bpm 91 bpm 93 bpm 92 bpm   Oxygen Saturation (Admit) 94 % 98 % 97 % 97 % 92 %   Oxygen Saturation (Exercise) 83 %  increased to 93 with o2 increase to 10L 94 % 88 % 93 % 92 %   Oxygen Saturation (Exit) 96 % 96 % 99 % 98 % 95 %   Rating of Perceived Exertion (Exercise) 12 12 11 14 14    Perceived Dyspnea (Exercise) 1 1 1 1 2     Duration Progress to 45 minutes of aerobic exercise without signs/symptoms of physical distress Progress to 45 minutes of aerobic exercise without signs/symptoms of physical distress Progress to 45 minutes of aerobic exercise without signs/symptoms of physical distress Progress to 45 minutes of aerobic exercise without signs/symptoms of physical distress Progress to 45 minutes of aerobic exercise without signs/symptoms of physical distress   Intensity THRR unchanged THRR unchanged THRR unchanged THRR unchanged THRR unchanged     Progression   Progression Continue to progress workloads to maintain intensity without signs/symptoms of physical distress. Continue to progress workloads to maintain intensity without signs/symptoms of physical distress. Continue to progress workloads to maintain intensity without signs/symptoms of physical distress. Continue to progress workloads to maintain intensity without signs/symptoms of physical distress. Continue to progress workloads to maintain intensity without signs/symptoms of physical distress.     Resistance Training   Training Prescription Yes Yes Yes Yes Yes   Weight green bands green bands greeen bands greeen bands greeen bands   Reps 10-12  10 minutes of strength training 10-12  10 minutes of strength training 10-12  10 minutes of strength training 10-12  10 minutes of strength training 10-12  10 minutes of strength training     Interval Training   Interval Training No No  - No No     Oxygen   Oxygen Continuous Continuous Continuous Continuous Continuous   Liters 8  increased to 10 on track, 4 at rest 8  increased to 10 on track, 4 at rest 8 8-10 8-10     NuStep   Level 3 3 3 3 3    Minutes 17 17 17 17 17    METs 1.6 1.6 1.6 1.4 1.6     Arm Ergometer   Level 3 3  - 3 3   Minutes 17 17  - 17 [place]  8  - 13 12  -   Minutes 17  - 17 17  -     Home Exercise Plan   Plans to continue exercise at  -  -  - Home Home   Frequency   -  -  - Add 3 additional days to program exercise sessions. Add 3 additional days to program exercise sessions.   Row Name 12/21/15 1200 12/23/15 1200 01/04/16 1200 01/06/16 1200       Exercise Review   Progression Yes  -  -  -      Response to Exercise   Blood Pressure (Admit) 120/64 98/50 114/58 126/64    Blood Pressure (Exercise) 126/70 100/60 126/70 142/86    Blood Pressure (Exit) 106/64 100/60 123/75 112/76    Heart Rate (Admit) 104 bpm 94 bpm 78 bpm 89 bpm    Heart Rate (Exercise) 114 bpm 102 bpm 101 bpm 108 bpm    Heart Rate (Exit) 95 bpm 93 bpm 88 bpm 88 bpm    Oxygen Saturation (Admit) 90 % 99 % 100 % 99 %    Oxygen Saturation (Exercise) 91 % 88 % 92 % 89 %    Oxygen Saturation (Exit) 97 % 98 % 99 % 99 %    Rating of Perceived Exertion (Exercise) 12 11 15 12     Perceived Dyspnea (Exercise) 1 2 1 1     Duration Progress to 45 minutes of aerobic exercise without signs/symptoms of physical distress Progress to 45 minutes of aerobic exercise without signs/symptoms of physical distress Progress to 45 minutes of aerobic exercise without signs/symptoms of physical distress Progress to 45 minutes of aerobic exercise without signs/symptoms of physical distress    Intensity THRR unchanged THRR unchanged THRR unchanged THRR unchanged      Progression   Progression Continue to progress workloads to maintain intensity without signs/symptoms of physical distress. Continue to progress workloads to maintain intensity without signs/symptoms of physical distress. Continue to progress workloads to maintain intensity without signs/symptoms of physical distress. Continue to progress workloads to maintain intensity without signs/symptoms of physical distress.      Resistance Training   Training Prescription Yes Yes Yes Yes    Weight greeen bands greeen bands green bands green bands    Reps 10-12  10 minutes of strength training 10-12  10 minutes of strength training 10-12  10 minutes of strength  training 10-12  10 minutes of strength training      Interval Training   Interval Training No No No No      Oxygen   Oxygen  -  - Continuous Continuous    Liters  -  - 8-10 10      NuStep   Level 3  - 4 4    Minutes 17  - 17 17    METs 1.6  - 1.7 1.7      Arm Ergometer   Level 3 4 4   -    Minutes 17 17 17   -      Track   Laps 8 8 9 8     Minutes 17 17 17 17        Exercise Comments:     Exercise Comments    Row Name 11/15/15 1021 12/13/15 1644 12/14/15 1550 01/10/16 1000     Exercise Comments Patient has only attended one sessions. Will cont. to monitor.  Patient is slowly progressing in Pulmonary Rehab. We have had to adjust his oxygen needs during exercise. He is now  on 10 liters while walking. Will cont. to monitor and progress.  home exercise completed Patient is cont. to improve in workload intensities. O2 is now managed well. Will cont. to monitor.        Discharge Exercise Prescription (Final Exercise Prescription Changes):     Exercise Prescription Changes - 01/06/16 1200      Response to Exercise   Blood Pressure (Admit) 126/64   Blood Pressure (Exercise) 142/86   Blood Pressure (Exit) 112/76   Heart Rate (Admit) 89 bpm   Heart Rate (Exercise) 108 bpm   Heart Rate (Exit) 88 bpm   Oxygen Saturation (Admit) 99 %   Oxygen Saturation (Exercise) 89 %   Oxygen Saturation (Exit) 99 %   Rating of Perceived Exertion (Exercise) 12   Perceived Dyspnea (Exercise) 1   Duration Progress to 45 minutes of aerobic exercise without signs/symptoms of physical distress   Intensity THRR unchanged     Progression   Progression Continue to progress workloads to maintain intensity without signs/symptoms of physical distress.     Resistance Training   Training Prescription Yes   Weight green bands   Reps 10-12  10 minutes of strength training     Interval Training   Interval Training No     Oxygen   Oxygen Continuous   Liters 10     NuStep   Level 4   Minutes 17    METs 1.7     Track   Laps 8   Minutes 17       Nutrition:  Target Goals: Understanding of nutrition guidelines, daily intake of sodium 1500mg , cholesterol 200mg , calories 30% from fat and 7% or less from saturated fats, daily to have 5 or more servings of fruits and vegetables.  Biometrics:     Pre Biometrics - 11/08/15 1102      Pre Biometrics   Grip Strength 30 kg       Nutrition Therapy Plan and Nutrition Goals:   Nutrition Discharge: Rate Your Plate Scores:   Psychosocial: Target Goals: Acknowledge presence or absence of depression, maximize coping skills, provide positive support system. Participant is able to verbalize types and ability to use techniques and skills needed for reducing stress and depression.  Initial Review & Psychosocial Screening:     Initial Psych Review & Screening - 11/08/15 [place]? Yes  daughters live in [place]     Barriers   Psychosocial barriers to participate in program There are no identifiable barriers or psychosocial needs.     Screening Interventions   Interventions Encouraged to exercise      Quality of Life Scores:     Quality of Life - 11/16/15 1618      Quality of Life Scores   Health/Function Pre 14.43 %   Socioeconomic Pre 21.36 %   Psych/Spiritual Pre 16.64 %   Family Pre 20.5 %   GLOBAL Pre 17.11 %      PHQ-9: Recent Review Flowsheet Data    Depression screen PHQ 2/9 11/08/2015   Decreased Interest 0   Down, Depressed, Hopeless 0   PHQ - 2 Score 0      Psychosocial Evaluation and Intervention:     Psychosocial Evaluation - 11/08/15 1106      Psychosocial Evaluation & Interventions   Continued Psychosocial Services Needed --  none have been needed in the past, none needed now      Psychosocial Re-Evaluation:  Psychosocial Re-Evaluation    Row Name 11/16/15 [phone] 12/14/15 0659 01/11/16 0719         Psychosocial  Re-Evaluation   Interventions Encouraged to attend Pulmonary Rehabilitation for the exercise Encouraged to attend Pulmonary Rehabilitation for the exercise Encouraged to attend Pulmonary Rehabilitation for the exercise     Comments no psychosocial barriers identified at this time no psychosocial barriers identified at this time no psychosocial barriers identified at this time       Education: Education Goals: Education classes will be provided on a weekly basis, covering required topics. Participant will state understanding/return demonstration of topics presented.  Learning Barriers/Preferences:     Learning Barriers/Preferences - 11/08/15 1059      Learning Barriers/Preferences   Learning Barriers None   Learning Preferences Audio;Group Instruction;Individual Instruction;Skilled Demonstration;Verbal Instruction      Education Topics: Risk Factor Reduction:  -Group instruction that is supported by a PowerPoint presentation. Instructor discusses the definition of a risk factor, different risk factors for pulmonary disease, and how the heart and lungs work together.     Nutrition for Pulmonary Patient:  -Group instruction provided by PowerPoint slides, verbal discussion, and written materials to support subject matter. The instructor gives an explanation and review of healthy diet recommendations, which includes a discussion on weight management, recommendations for fruit and vegetable consumption, as well as protein, fluid, caffeine, fiber, sodium, sugar, and alcohol. Tips for eating when patients are short of breath are discussed. Flowsheet Row PULMONARY REHAB OTHER RESPIRATORY from 01/06/2016 in [place]  Date  12/16/15 [facility] Eating During the [place]  Educator  RD  Instruction Review Code  2- meets goals/outcomes      Pursed Lip Breathing:  -Group instruction that is supported by demonstration and informational handouts. Instructor discusses  the benefits of pursed lip and diaphragmatic breathing and detailed demonstration on how to preform both.   Flowsheet Row PULMONARY REHAB OTHER RESPIRATORY from 01/06/2016 in [place]  Date  11/18/15  Educator  ep  Instruction Review Code  2- meets goals/outcomes      Oxygen Safety:  -Group instruction provided by PowerPoint, verbal discussion, and written material to support subject matter. There is an overview of "What is Oxygen" and "Why do we need it".  Instructor also reviews how to create a safe environment for oxygen use, the importance of using oxygen as prescribed, and the risks of noncompliance. There is a brief discussion on traveling with oxygen and resources the patient may utilize. Flowsheet Row PULMONARY REHAB OTHER RESPIRATORY from 01/06/2016 in [place]  Date  12/09/15  Educator  RN  Instruction Review Code  2- meets goals/outcomes      Oxygen Equipment:  -Group instruction provided by [facility] Staff utilizing handouts, written materials, and equipment demonstrations. Flowsheet Row PULMONARY REHAB OTHER RESPIRATORY from 01/06/2016 in [place]  Date  12/23/15  Educator  rep  Instruction Review Code  2- meets goals/outcomes      Signs and Symptoms:  -Group instruction provided by written material and verbal discussion to support subject matter. Warning signs and symptoms of infection, stroke, and heart attack are reviewed and when to call the physician/911 reinforced. Tips for preventing the spread of infection discussed.   Advanced Directives:  -Group instruction provided by verbal instruction and written material to support subject matter. Instructor reviews Advanced Directive laws and proper instruction for filling out document.   Pulmonary  Video:  -Group video education that reviews the importance of medication and oxygen compliance, exercise, good nutrition,  pulmonary hygiene, and pursed lip and diaphragmatic breathing for the pulmonary patient. Flowsheet Row PULMONARY REHAB OTHER RESPIRATORY from 01/06/2016 in [place]  Date  11/25/15  Educator  video  Instruction Review Code  2- meets goals/outcomes      Exercise for the Pulmonary Patient:  -Group instruction that is supported by a PowerPoint presentation. Instructor discusses benefits of exercise, core components of exercise, frequency, duration, and intensity of an exercise routine, importance of utilizing pulse oximetry during exercise, safety while exercising, and options of places to exercise outside of rehab.   Flowsheet Row PULMONARY REHAB OTHER RESPIRATORY from 01/06/2016 in [place]  Date  01/06/16  Educator  EP  Instruction Review Code  2- meets goals/outcomes      Pulmonary Medications:  -Verbally interactive group education provided by instructor with focus on inhaled medications and proper administration.   Anatomy and Physiology of the Respiratory System and Intimacy:  -Group instruction provided by PowerPoint, verbal discussion, and written material to support subject matter. Instructor reviews respiratory cycle and anatomical components of the respiratory system and their functions. Instructor also reviews differences in obstructive and restrictive respiratory diseases with examples of each. Intimacy, Sex, and Sexuality differences are reviewed with a discussion on how relationships can change when diagnosed with pulmonary disease. Common sexual concerns are reviewed.   Knowledge Questionnaire Score:     Knowledge Questionnaire Score - 11/16/15 1617      Knowledge Questionnaire Score   Pre Score 12/13      Core Components/Risk Factors/Patient Goals at Admission:     Personal Goals and Risk Factors at Admission - 11/08/15 1102      Core Components/Risk Factors/Patient Goals on Admission    Increase Strength and Stamina Yes   Intervention Provide advice, education, support and counseling about physical activity/exercise needs.;Develop an individualized exercise prescription for aerobic and resistive training based on initial evaluation findings, risk stratification, comorbidities and participant's personal goals.   Expected Outcomes Achievement of increased cardiorespiratory fitness and enhanced flexibility, muscular endurance and strength shown through measurements of functional capacity and personal statement of participant.   Improve shortness of breath with ADL's Yes   Intervention Provide education, individualized exercise plan and daily activity instruction to help decrease symptoms of SOB with activities of daily living.   Expected Outcomes Short Term: Achieves a reduction of symptoms when performing activities of daily living.   Develop more efficient breathing techniques such as purse lipped breathing and diaphragmatic breathing; and practicing self-pacing with activity Yes   Intervention Provide education, demonstration and support about specific breathing techniuqes utilized for more efficient breathing. Include techniques such as pursed lipped breathing, diaphragmatic breathing and self-pacing activity.   Expected Outcomes Short Term: Participant will be able to demonstrate and use breathing techniques as needed throughout daily activities.      Core Components/Risk Factors/Patient Goals Review:      Goals and Risk Factor Review    Row Name 11/16/15 0655 12/14/15 0659 01/11/16 0719         Core Components/Risk Factors/Patient Goals Review   Personal Goals Review Increase Strength and Stamina;Improve shortness of breath with ADL's;Develop more efficient breathing techniques such as purse lipped breathing and diaphragmatic breathing and practicing self-pacing with activity. Increase Strength and Stamina;Improve shortness of breath with ADL's;Develop more efficient  breathing techniques such as purse lipped breathing and  diaphragmatic breathing and practicing self-pacing with activity. Increase Strength and Stamina;Improve shortness of breath with ADL's;Develop more efficient breathing techniques such as purse lipped breathing and diaphragmatic breathing and practicing self-pacing with activity.     Review see "comments" section on ITP see "comments" section on ITP see "comments" section on ITP     Expected Outcomes see "Admission" expected outcomes see "Admission" expected outcomes see "Admission" expected outcomes        Core Components/Risk Factors/Patient Goals at Discharge (Final Review):      Goals and Risk Factor Review - 01/11/16 0719      Core Components/Risk Factors/Patient Goals Review   Personal Goals Review Increase Strength and Stamina;Improve shortness of breath with ADL's;Develop more efficient breathing techniques such as purse lipped breathing and diaphragmatic breathing and practicing self-pacing with activity.   Review see "comments" section on ITP   Expected Outcomes see "Admission" expected outcomes      ITP Comments:   Comments:  Pt is making expected progress toward pulmonary rehab goals after completing 14 sessions. He now has sufficient O2 supply at home that enables him to be more active and to continue his exercise and socialization at his retirement home. Recommend continued exercise, life style modification, education, and utilization of breathing techniques to increase stamina and strength and decrease shortness of breath with exertion.

## 2016-01-13 ENCOUNTER — Encounter (HOSPITAL_COMMUNITY)
Admission: RE | Admit: 2016-01-13 | Discharge: 2016-01-13 | Disposition: A | Discharge status: Home / Self Care | Payer: Medicare Other | Source: Ambulatory Visit | Attending: Pulmonary Disease | Admitting: Pulmonary Disease

## 2016-01-13 VITALS — Wt 188.1 lb

## 2016-01-13 DIAGNOSIS — J8410 Pulmonary fibrosis, unspecified: Secondary | ICD-10-CM

## 2016-01-13 NOTE — Progress Notes (Signed)
Daily Session Note  Patient Details  Name: Bradley Bennett MRN: [id number] Date of Birth: Jun 08, 1932 Referring Provider:   [name] Pulmonary Rehab Walk Test from 11/09/2015 in [place]  Referring Provider  Dr. Alva Garnet      Encounter Date: 01/13/2016  Check In:     Session Check In - 01/13/16 1054      Check-In   Location [facility]   Staff Present Su Hilt, MS, ACSM RCEP, Exercise Physiologist;Portia Rollene Rotunda, RN, Roque Cash, RN   Supervising physician immediately available to respond to emergencies [facility] immediately available   Physician(s) Dr. Allyson Sabal   Medication changes reported     No   Fall or balance concerns reported    No   Warm-up and Cool-down Performed as group-led instruction   Resistance Training Performed Yes   VAD Patient? No     Pain Assessment   Currently in Pain? No/denies   Multiple Pain Sites No      Capillary Blood Glucose: No results found for this or any previous visit (from the past 24 hour(s)).      Exercise Prescription Changes - 01/13/16 1200      Response to Exercise   Blood Pressure (Admit) 124/72   Blood Pressure (Exercise) 140/80   Blood Pressure (Exit) 106/70   Heart Rate (Admit) 94 bpm   Heart Rate (Exercise) 104 bpm   Heart Rate (Exit) 101 bpm   Oxygen Saturation (Admit) 96 %   Oxygen Saturation (Exercise) 89 %   Oxygen Saturation (Exit) 95 %   Rating of Perceived Exertion (Exercise) 11   Perceived Dyspnea (Exercise) 1   Duration Progress to 45 minutes of aerobic exercise without signs/symptoms of physical distress   Intensity THRR unchanged     Progression   Progression Continue to progress workloads to maintain intensity without signs/symptoms of physical distress.     Resistance Training   Training Prescription Yes   Weight green bands   Reps 10-12  10 minutes of sttrength training     Interval Training   Interval Training No     Oxygen    Oxygen Continuous   Liters 10     NuStep   Level 4   Minutes 17   METs 1.6     Track   Laps 8   Minutes 17     Goals Met:  Exercise tolerated well Strength training completed today  Goals Unmet:  Not Applicable  Comments: Service time is from 1030 to 1215    Dr. Rush Farmer is Medical Director for Pulmonary Rehab at [facility].

## 2016-01-13 NOTE — Progress Notes (Signed)
Subjective:    Patient ID: [place] Bennett, male    DOB: 09/24/1932, 82 y.o.   MRN: [id number]  HPI The patient is here for follow up.  In the past few weeks he has lost his taste.  He denies any cold symptoms or change in medication. He has chronic nasal congestion that is worse at night.  He is on oxygen 24/7.  He uses his inhalers daily as prescribed and does not rinse his mouth out after - he usually drinks something, but does not spit it out.   His thumbs lock up and it can be painful.  He has to pry the thumbs open.    Sinus problems:  At night he is unable to breath through his nose at night.  He uses oxygen 24 / 7.  He uses a nasal saline spray.  His oxygen has a humidifier on it at night.   Hypothyroidism:  He is taking his medication daily.  He denies any recent changes in energy or weight that are unexplained.   CAD s/p CABG:  He is taking all of his medications as prescribed.  He denies chest pain, palpitations and leg swelling.    Hyperlipidemia: He is taking his medication daily. He is compliant with a low fat/cholesterol diet. He is not exercising.   Insomnia:  He is taking the trazodone nightly.  2-3 times a week he is not able to sleep and stays awake until early morning.  He denies side effects from the medication.   Anxiety:  He is taking buspar daily and he is not using the xanax.    He feels his anxiety is controlled.    He has cramping in the legs at night, which is not new.     Respiratory failure:  He is using his oxygen all the time. He is at 6 L/min at rest,  8 L/min moving around, 10 L/Min out and about.  He uses his inhalers daily.    Medications and allergies reviewed with patient and updated if appropriate.  Patient Active Problem List   Diagnosis Date Noted  . COPD (chronic obstructive pulmonary disease) ([place]) 07/16/2015  . Hypoxemia 07/16/2015  . Postinflammatory pulmonary fibrosis ([place]) 07/16/2015  . Parkinson disease ([place]) 07/16/2015  .  Hypothyroidism 07/16/2015  . Insomnia 07/16/2015  . BPH (benign prostatic hypertrophy) 07/16/2015  . Anxiety 07/16/2015  . Carpal tunnel syndrome, right 07/16/2015  . Chronic lower back pain 07/16/2015  . Hyperglycemia 07/16/2015  . Macular degeneration 07/16/2015  . CAD in native artery 06/15/2015  . 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 S/P coronary artery stent placement 06/15/2015  . Hyperlipidemia 06/15/2015    Current Outpatient Prescriptions on File Prior to Visit  Medication Sig Dispense Refill  . acetaminophen (TYLENOL) 500 MG tablet Take 500 mg by mouth as needed.    Marland Kitchen atorvastatin (LIPITOR) 10 MG tablet Take 10 mg by mouth daily.    . busPIRone (BUSPAR) 5 MG tablet Take 1 tablet (5 mg total) by mouth daily. 90 tablet 1  . carbidopa-levodopa (SINEMET CR) 50-200 MG tablet Take 1 tablet by mouth at bedtime. 90 tablet 1  . carbidopa-levodopa (SINEMET IR) 25-100 MG tablet Take 2 tablets by mouth 3 (three) times daily. 540 tablet 1  . clopidogrel (PLAVIX) 75 MG tablet Take 75 mg by mouth daily.    . finasteride (PROSCAR) 5 MG tablet Take 1 tablet (5 mg total) by mouth daily. 90 tablet 1  . Fluticasone-Salmeterol (ADVAIR DISKUS) 500-50  MCG/DOSE AEPB Inhale 1 puff into the lungs 2 (two) times daily.    Marland Kitchen levothyroxine (SYNTHROID, LEVOTHROID) 150 MCG tablet Take 1 tablet (150 mcg total) by mouth daily. 90 tablet 3  . mirabegron ER (MYRBETRIQ) 25 MG TB24 tablet Take 25 mg by mouth daily.    . Multiple Vitamins-Minerals (CENTRUM VITAMINTS PO) Take 1 tablet by mouth daily.     . tamsulosin (FLOMAX) 0.4 MG CAPS capsule Take 0.4 mg by mouth daily.     . traZODone (DESYREL) 50 MG tablet Take 1 tablet (50 mg total) by mouth at bedtime. 90 tablet 1  . umeclidinium bromide (INCRUSE ELLIPTA) 62.5 MCG/INH AEPB Inhale 1 puff into the lungs daily.     No current facility-administered medications on file prior to visit.     Past Medical History:  Diagnosis Date  . Arthritis   . Asthma   . Coronary  artery disease   . Emphysema of lung ([place])   . Hearing difficulty of both ears   . Heart attack   . Heart murmur   . Hyperlipidemia   . Hypertension   . ILD (interstitial lung disease) ([place])   . Parkinson disease [facility])     Past Surgical History:  Procedure Laterality Date  . CARPAL TUNNEL RELEASE Right 03/2014  . CORONARY ANGIOPLASTY WITH STENT PLACEMENT    . CORONARY ARTERY BYPASS GRAFT    . HERNIA REPAIR  10/2013   x3   . VEIN BYPASS SURGERY      Social History   Social History  . Marital status: Divorced    Spouse name: N/A  . Number of children: N/A  . Years of education: N/A   Occupational History  . retired     [place] History Main Topics  . Smoking status: Former Smoker    Packs/day: 2.00    Years: 35.00    Quit date: 08/15/1985  . Smokeless tobacco: Never Used     Comment: quit smoking in 1992  . Alcohol use 0.0 oz/week     Comment: twice every 6 months  . Drug use: No  . Sexual activity: Not on file   Other Topics Concern  . Not on file   Social History Narrative  . No narrative on file    Family History  Problem Relation Age of Onset  . Colon cancer Father   . Stroke Father   . Arthritis Mother   . Arthritis Sister   . Heart disease Brother   . Kidney cancer Brother     Review of Systems  Constitutional: Negative for chills and fever.  Respiratory: Positive for cough (occ) and shortness of breath (oxygen 24/7 - chronic). Negative for wheezing.   Cardiovascular: Negative for chest pain, palpitations and leg swelling.  Gastrointestinal: Negative for abdominal pain.  Neurological: Negative for dizziness, light-headedness and headaches.  Psychiatric/Behavioral: Negative for dysphoric mood.       Objective:   Vitals:   01/14/16 0844  BP: 138/78  Pulse: 81  Resp: 20  Temp: 97.8 F (36.6 C)   Filed Weights   01/14/16 0844  Weight: 188 lb (85.3 kg)   Body mass index is 26.22 kg/m.   Physical Exam    Constitutional: Appears  well-developed and well-nourished. No distress.  HENT:  Head: Normocephalic and atraumatic.  Neck: Neck supple. No tracheal deviation present. No thyromegaly present.  No cervical lymphadenopathy. Tongue with white coating in back and mildly erythema  Cardiovascular: Normal rate, regular rhythm and  normal heart sounds.   No murmur heard. No carotid bruit .  Trace b/l LEedema Pulmonary/Chest: Effort normal . No respiratory distress. No has no wheezes. Bibasilar crackles  Skin: Skin is warm and dry. Not diaphoretic.  Psychiatric: Normal mood and affect. Behavior is normal.      Assessment & Plan:   prevnar today  See Problem List for Assessment and Plan of chronic medical problems.   Fu 6 months

## 2016-01-14 ENCOUNTER — Other Ambulatory Visit (INDEPENDENT_AMBULATORY_CARE_PROVIDER_SITE_OTHER): Payer: Medicare Other

## 2016-01-14 ENCOUNTER — Ambulatory Visit (INDEPENDENT_AMBULATORY_CARE_PROVIDER_SITE_OTHER): Payer: Medicare Other | Admitting: Internal Medicine

## 2016-01-14 ENCOUNTER — Encounter: Payer: Self-pay | Admitting: Internal Medicine

## 2016-01-14 VITALS — BP 138/78 | HR 81 | Temp 97.8°F | Resp 20 | Wt 188.0 lb

## 2016-01-14 DIAGNOSIS — B370 Candidal stomatitis: Secondary | ICD-10-CM

## 2016-01-14 DIAGNOSIS — E038 Other specified hypothyroidism: Secondary | ICD-10-CM | POA: Diagnosis not present

## 2016-01-14 DIAGNOSIS — I2510 Atherosclerotic heart disease of native coronary artery without angina pectoris: Secondary | ICD-10-CM | POA: Diagnosis not present

## 2016-01-14 DIAGNOSIS — J449 Chronic obstructive pulmonary disease, unspecified: Secondary | ICD-10-CM

## 2016-01-14 DIAGNOSIS — Z23 Encounter for immunization: Secondary | ICD-10-CM

## 2016-01-14 DIAGNOSIS — M65312 Trigger thumb, left thumb: Secondary | ICD-10-CM

## 2016-01-14 DIAGNOSIS — G4700 Insomnia, unspecified: Secondary | ICD-10-CM

## 2016-01-14 DIAGNOSIS — F419 Anxiety disorder, unspecified: Secondary | ICD-10-CM

## 2016-01-14 DIAGNOSIS — M65311 Trigger thumb, right thumb: Secondary | ICD-10-CM

## 2016-01-14 LAB — COMPREHENSIVE METABOLIC PANEL
ALT: 4 U/L (ref 0–53)
AST: 17 U/L (ref 0–37)
Albumin: 3.7 g/dL (ref 3.5–5.2)
Alkaline Phosphatase: 88 U/L (ref 39–117)
BILIRUBIN TOTAL: 0.5 mg/dL (ref 0.2–1.2)
BUN: 13 mg/dL (ref 6–23)
CALCIUM: 9.1 mg/dL (ref 8.4–10.5)
CO2: 29 meq/L (ref 19–32)
CREATININE: 0.74 mg/dL (ref 0.40–1.50)
Chloride: 104 mEq/L (ref 96–112)
GFR: 107.73 mL/min (ref >60.00)
GLUCOSE: 95 mg/dL (ref 70–99)
Potassium: 4.2 mEq/L (ref 3.5–5.1)
SODIUM: 139 meq/L (ref 135–145)
Total Protein: 6.8 g/dL (ref 6.0–8.3)

## 2016-01-14 LAB — TSH: TSH: 2.11 u[IU]/mL (ref 0.35–4.50)

## 2016-01-14 MED ORDER — TRAZODONE HCL 50 MG PO TABS: 75.0000 mg | ORAL_TABLET | Freq: Every day | ORAL | 1 refills | Status: DC

## 2016-01-14 MED ORDER — CLOTRIMAZOLE 10 MG MT TROC: 10.0000 mg | Freq: Every day | OROMUCOSAL | 0 refills | Status: DC

## 2016-01-14 MED ORDER — MIRABEGRON ER 25 MG PO TB24: 25.0000 mg | ORAL_TABLET | Freq: Every day | ORAL | 1 refills | Status: DC

## 2016-01-14 MED ORDER — FLUTICASONE PROPIONATE 50 MCG/ACT NA SUSP: 2.0000 | Freq: Every day | NASAL | 6 refills | Status: DC

## 2016-01-14 NOTE — Patient Instructions (Addendum)
  Test(s) ordered today. Your results will be released to [facility] [place] (or called to you) after review, usually within 72hours after test completion. If any changes need to be made, you will be notified at that same time.  All other Health Maintenance issues reviewed.   All recommended immunizations and age-appropriate screenings are up-to-date or discussed.  prevnar vaccine administered today.   Medications reviewed and updated.  Changes include starting clotrimazole lozenges for possible thrush.  Your prescription(s) have been submitted to your pharmacy. Please take as directed and contact our office if you believe you are having problem(s) with the medication(s).   Please followup in 6 months

## 2016-01-14 NOTE — Progress Notes (Signed)
Pre visit review using our clinic review tool, if applicable. No additional management support is needed unless otherwise documented below in the visit note. 

## 2016-01-15 ENCOUNTER — Encounter: Payer: Self-pay | Admitting: Internal Medicine

## 2016-01-15 DIAGNOSIS — M65312 Trigger thumb, left thumb: Secondary | ICD-10-CM

## 2016-01-15 DIAGNOSIS — M65311 Trigger thumb, right thumb: Secondary | ICD-10-CM | POA: Insufficient documentation

## 2016-01-15 DIAGNOSIS — B370 Candidal stomatitis: Secondary | ICD-10-CM | POA: Insufficient documentation

## 2016-01-15 NOTE — Assessment & Plan Note (Signed)
Loss of taste likely related to thrush - not rinsing mouth out after using inhalers Tongue appears to have thrush Will treat with clotrimazole 5/day x 10 days If taste does not return consider ENT referral

## 2016-01-15 NOTE — Assessment & Plan Note (Signed)
Deferred ortho referral today

## 2016-01-15 NOTE — Assessment & Plan Note (Signed)
Check tsh  Titrate med dose if needed  

## 2016-01-15 NOTE — Assessment & Plan Note (Signed)
Trazodone at current dose is not effective Will try increasing to 75 mg nightly - can increase to 100 mg Discussed possible increased risk of falls

## 2016-01-15 NOTE — Assessment & Plan Note (Addendum)
Following with pulmonary On oxygen 24/7 - oxygen demands have increased Using inhalers prevnar today

## 2016-01-15 NOTE — Assessment & Plan Note (Addendum)
No chest pain or palpitations Continue current medications cmp

## 2016-01-15 NOTE — Assessment & Plan Note (Signed)
Controlled, stable Continue current dose of medication  

## 2016-01-17 ENCOUNTER — Ambulatory Visit (INDEPENDENT_AMBULATORY_CARE_PROVIDER_SITE_OTHER): Payer: Medicare Other | Admitting: Cardiology

## 2016-01-17 ENCOUNTER — Encounter: Payer: Self-pay | Admitting: Cardiology

## 2016-01-17 VITALS — BP 128/78 | HR 80 | Ht 70.5 in | Wt 188.1 lb

## 2016-01-17 DIAGNOSIS — I2510 Atherosclerotic heart disease of native coronary artery without angina pectoris: Secondary | ICD-10-CM | POA: Diagnosis not present

## 2016-01-17 DIAGNOSIS — E7800 Pure hypercholesterolemia, unspecified: Secondary | ICD-10-CM | POA: Diagnosis not present

## 2016-01-17 NOTE — Patient Instructions (Signed)
Medication Instructions:  Your physician recommends that you continue on your current medications as directed. Please refer to the Current Medication list given to you today.   Labwork: FASTING LABS same day as stress test :LFTs, Lipids  Testing/Procedures: Dr. Radford Pax recommends you have a DOBUTAMINE MYOVIEW.  Follow-Up: Your physician wants you to follow-up in: 6 months with Dr. Radford Pax. You will receive a reminder letter in the mail two months in advance. If you don't receive a letter, please call our office to schedule the follow-up appointment.   Any Other Special Instructions Will Be Listed Below (If Applicable).     If you need a refill on your cardiac medications before your next appointment, please call your pharmacy.

## 2016-01-17 NOTE — Progress Notes (Signed)
Cardiology Office Note    Date:  01/17/2016   ID:  Bradley Bennett, DOB 11-06-1932, MRN [id number]  PCP:  Bradley Rail, MD  Cardiologist:  Bradley Him, MD   No chief complaint on file.   History of Present Illness:  Bradley Bennett is a 82 y.o. male with a history of ASCAD with MI in 10 and subsequently underwent 4 vessel CABG in [place].  Since then he has had several PCIs with the last one being 2015.  He has chronic SOB from ILD and is on chronic O2 and follows with Dr. [place] Bennett.  He denies any chest pain or pressure, LE edema, dizziness, palpitations or syncope.  He has not had any PND, orthopnea.  He says that for a few days he had some pain in his left arm that occurred when he was in but not with exertion and it has resolved.  This occurred about a week ago and was reminiscent of his prior angina.     Past Medical History:  Diagnosis Date  . Arthritis   . Asthma   . Coronary artery disease   . Emphysema of lung ([place])   . Hearing difficulty of both ears   . Heart attack   . Heart murmur   . Hyperlipidemia   . Hypertension   . ILD (interstitial lung disease) ([place])   . Parkinson disease [place])     Past Surgical History:  Procedure Laterality Date  . CARPAL TUNNEL RELEASE Right 03/2014  . CORONARY ANGIOPLASTY WITH STENT PLACEMENT    . CORONARY ARTERY BYPASS GRAFT    . HERNIA REPAIR  10/2013   x3   . VEIN BYPASS SURGERY      Current Medications: Outpatient Medications Prior to Visit  Medication Sig Dispense Refill  . acetaminophen (TYLENOL) 500 MG tablet Take 500 mg by mouth as needed.    Marland Kitchen atorvastatin (LIPITOR) 10 MG tablet Take 10 mg by mouth daily.    . busPIRone (BUSPAR) 5 MG tablet Take 1 tablet (5 mg total) by mouth daily. 90 tablet 1  . carbidopa-levodopa (SINEMET CR) 50-200 MG tablet Take 1 tablet by mouth at bedtime. 90 tablet 1  . carbidopa-levodopa (SINEMET IR) 25-100 MG tablet Take 2 tablets by mouth 3 (three) times daily. 540 tablet 1  .  clopidogrel (PLAVIX) 75 MG tablet Take 75 mg by mouth daily.    . clotrimazole (MYCELEX) 10 MG troche Take 1 tablet (10 mg total) by mouth 5 (five) times daily. 50 tablet 0  . finasteride (PROSCAR) 5 MG tablet Take 1 tablet (5 mg total) by mouth daily. 90 tablet 1  . fluticasone (FLONASE) 50 MCG/ACT nasal spray Place 2 sprays into both nostrils daily. 16 g 6  . Fluticasone-Salmeterol (ADVAIR DISKUS) 500-50 MCG/DOSE AEPB Inhale 1 puff into the lungs 2 (two) times daily.    Marland Kitchen levothyroxine (SYNTHROID, LEVOTHROID) 150 MCG tablet Take 1 tablet (150 mcg total) by mouth daily. 90 tablet 3  . mirabegron ER (MYRBETRIQ) 25 MG TB24 tablet Take 1 tablet (25 mg total) by mouth daily. 90 tablet 1  . Multiple Vitamins-Minerals (CENTRUM VITAMINTS PO) Take 1 tablet by mouth daily.     . tamsulosin (FLOMAX) 0.4 MG CAPS capsule Take 0.4 mg by mouth daily.     . traZODone (DESYREL) 50 MG tablet Take 1.5-2 tablets (75-100 mg total) by mouth at bedtime. 180 tablet 1  . umeclidinium bromide (INCRUSE ELLIPTA) 62.5 MCG/INH AEPB Inhale 1 puff into the lungs daily.  No facility-administered medications prior to visit.      Allergies:   Isosorbide   Social History   Social History  . Marital status: Divorced    Spouse name: N/A  . Number of children: N/A  . Years of education: N/A   Occupational History  . retired     [place] History Main Topics  . Smoking status: Former Smoker    Packs/day: 2.00    Years: 35.00    Quit date: 08/15/1985  . Smokeless tobacco: Never Used     Comment: quit smoking in 1992  . Alcohol use 0.0 oz/week     Comment: twice every 6 months  . Drug use: No  . Sexual activity: Not Asked   Other Topics Concern  . None   Social History Narrative  . None     Family History:  The patient's family history includes Arthritis in his mother and sister; Colon cancer in his father; Heart disease in his brother; Kidney cancer in his brother; Stroke in his father.   ROS:     Please see the history of present illness.    ROS All other systems reviewed and are negative.  No flowsheet data found.     PHYSICAL EXAM:   VS:  BP 128/78   Pulse 80   Ht 5' 10.5" (1.791 m)   Wt 188 lb 1.9 oz (85.3 kg)   BMI 26.61 kg/m    GEN: Well nourished, well developed, in no acute distress  HEENT: normal  Neck: no JVD, carotid bruits, or masses Cardiac: RRR; no murmurs, rubs, or gallops,no edema.  Intact distal pulses bilaterally.  Respiratory:  clear to auscultation bilaterally, normal work of breathing GI: soft, nontender, nondistended, + BS MS: no deformity or atrophy  Skin: warm and dry, no rash Neuro:  Alert and Oriented x 3, Strength and sensation are intact Psych: euthymic mood, full affect  Wt Readings from Last 3 Encounters:  01/17/16 188 lb 1.9 oz (85.3 kg)  01/14/16 188 lb (85.3 kg)  01/13/16 188 lb 0.8 oz (85.3 kg)      Studies/Labs Reviewed:   EKG:  EKG is notordered today.    Recent Labs: 07/23/2015: Hemoglobin 14.1; Platelets 264.0 01/14/2016: ALT 4; BUN 13; Creatinine, Ser 0.74; Potassium 4.2; Sodium 139; TSH 2.11   Lipid Panel    Component Value Date/Time   CHOL 172 07/23/2015 0853   TRIG 108.0 07/23/2015 0853   HDL 57.60 07/23/2015 0853   CHOLHDL 3 07/23/2015 0853   VLDL 21.6 07/23/2015 0853   LDLCALC 92 07/23/2015 0853    Additional studies/ records that were reviewed today include:  none    ASSESSMENT:    1. CAD in native artery   2. Pure hypercholesterolemia      PLAN:  In order of problems listed above:  1. ASCAD s/p remote CABG and subsequently PCI several times, the last in 2015.  He has not had any anginal symptoms.  Continue ASA/Plavix.  No BB due to ILD.  He had some arm pain a week ago that was reminiscent of his prior anginal pain with his MI.  I will get a dobutamine myoview to rule out ischemia.   2. Hyperlipidemia - LDL goal < 70.  Check FLP and ALT.  Continue statin.     Medication Adjustments/Labs and  Tests Ordered: Current medicines are reviewed at length with the patient today.  Concerns regarding medicines are outlined above.  Medication changes, Labs and Tests ordered  today are listed in the Patient Instructions below.  There are no Patient Instructions on file for this visit.   Signed, Bradley Him, MD  01/17/2016 9:49 AM    [address], [place], [place]  [id number] Phone: [phone]; Fax: [phone]

## 2016-01-18 ENCOUNTER — Encounter (HOSPITAL_COMMUNITY)
Admission: RE | Admit: 2016-01-18 | Discharge: 2016-01-18 | Disposition: A | Discharge status: Home / Self Care | Payer: Medicare Other | Source: Ambulatory Visit | Attending: Pulmonary Disease | Admitting: Pulmonary Disease

## 2016-01-18 VITALS — Wt 188.7 lb

## 2016-01-18 DIAGNOSIS — J8410 Pulmonary fibrosis, unspecified: Secondary | ICD-10-CM

## 2016-01-18 NOTE — Progress Notes (Signed)
Daily Session Note  Patient Details  Name: Bradley Bennett MRN: [id number] Date of Birth: 1932/04/08 Referring Provider:   [name] Pulmonary Rehab Walk Test from 11/09/2015 in [place]  Referring Provider  Dr. Alva Garnet      Encounter Date: 01/18/2016  Check In:     Session Check In - 01/18/16 1030      Check-In   Location [facility]   Staff Present Rosebud Poles, RN, BSN;Molly diVincenzo, MS, ACSM RCEP, Exercise Physiologist;Lisa Ysidro Evert, RN;Londyn Wotton Rollene Rotunda, RN, BSN   Supervising physician immediately available to respond to emergencies [facility] immediately available   Physician(s) Dr. Nada Maclachlan   Medication changes reported     No   Fall or balance concerns reported    No   Warm-up and Cool-down Performed as group-led instruction   Resistance Training Performed Yes   VAD Patient? No     Pain Assessment   Currently in Pain? No/denies   Multiple Pain Sites No      Capillary Blood Glucose: No results found for this or any previous visit (from the past 24 hour(s)).      Exercise Prescription Changes - 01/18/16 1213      Exercise Review   Progression Yes     Response to Exercise   Blood Pressure (Admit) 110/56   Blood Pressure (Exercise) 116/60   Blood Pressure (Exit) 132/82  114/80 reck   Heart Rate (Admit) 91 bpm   Heart Rate (Exercise) 107 bpm   Heart Rate (Exit) 81 bpm   Oxygen Saturation (Admit) 100 %   Oxygen Saturation (Exercise) 88 %   Oxygen Saturation (Exit) 98 %   Rating of Perceived Exertion (Exercise) 11   Perceived Dyspnea (Exercise) 1   Duration Progress to 45 minutes of aerobic exercise without signs/symptoms of physical distress   Intensity THRR unchanged     Progression   Progression Continue to progress workloads to maintain intensity without signs/symptoms of physical distress.     Resistance Training   Training Prescription Yes   Weight green bands   Reps 10-12  10 minutes of  sttrength training     Interval Training   Interval Training No     Oxygen   Oxygen Continuous   Liters 10     NuStep   Level 5   Minutes 17   METs 1.8     Arm Ergometer   Level 4   Minutes 17     Track   Laps 10   Minutes 17     Goals Met:  Improved SOB with ADL's Using PLB without cueing & demonstrates good technique Exercise tolerated well No report of cardiac concerns or symptoms Strength training completed today  Goals Unmet:  Not Applicable  Comments: Service time is from 1030 to 1200   Dr. Rush Farmer is Medical Director for Pulmonary Rehab at [facility].

## 2016-01-20 ENCOUNTER — Encounter (HOSPITAL_COMMUNITY)
Admission: RE | Admit: 2016-01-20 | Discharge: 2016-01-20 | Disposition: A | Discharge status: Home / Self Care | Payer: Medicare Other | Source: Ambulatory Visit | Attending: Pulmonary Disease | Admitting: Pulmonary Disease

## 2016-01-20 VITALS — Wt 187.6 lb

## 2016-01-20 DIAGNOSIS — J8410 Pulmonary fibrosis, unspecified: Secondary | ICD-10-CM

## 2016-01-20 NOTE — Progress Notes (Signed)
Daily Session Note  Patient Details  Name: Bradley Bennett MRN: [id number] Date of Birth: Jul 07, 1932 Referring Provider:   [name] Pulmonary Rehab Walk Test from 11/09/2015 in [place]  Referring Provider  Dr. Alva Garnet      Encounter Date: 01/20/2016  Check In:     Session Check In - 01/20/16 1238      Check-In   Location [facility]      Capillary Blood Glucose: No results found for this or any previous visit (from the past 24 hour(s)).      Exercise Prescription Changes - 01/20/16 1200      Response to Exercise   Blood Pressure (Admit) 130/72   Blood Pressure (Exercise) 120/70   Blood Pressure (Exit) 110/60   Heart Rate (Admit) 92 bpm   Heart Rate (Exercise) 89 bpm   Heart Rate (Exit) 76 bpm   Oxygen Saturation (Admit) 95 %   Oxygen Saturation (Exercise) 94 %   Oxygen Saturation (Exit) 96 %   Rating of Perceived Exertion (Exercise) 12   Perceived Dyspnea (Exercise) 1   Duration Progress to 45 minutes of aerobic exercise without signs/symptoms of physical distress   Intensity THRR unchanged     Progression   Progression Continue to progress workloads to maintain intensity without signs/symptoms of physical distress.     Resistance Training   Training Prescription Yes   Weight green bands   Reps 10-12  10 minutes of sttrength training     Interval Training   Interval Training No     Oxygen   Oxygen Continuous   Liters 10     NuStep   Level 5   Minutes 17   METs 1.8     Arm Ergometer   Level 4   Minutes 17     Goals Met:  Exercise tolerated well No report of cardiac concerns or symptoms Strength training completed today  Goals Unmet:  Not Applicable  Comments: Service time is from 10:30am to 12:30pm    Dr. Rush Farmer is Medical Director for Pulmonary Rehab at [facility].

## 2016-01-21 ENCOUNTER — Other Ambulatory Visit: Payer: Self-pay | Admitting: Internal Medicine

## 2016-01-25 ENCOUNTER — Encounter (HOSPITAL_COMMUNITY)
Admission: RE | Admit: 2016-01-25 | Discharge: 2016-01-25 | Disposition: A | Discharge status: Home / Self Care | Payer: Medicare Other | Source: Ambulatory Visit | Attending: Cardiology | Admitting: Cardiology

## 2016-01-25 VITALS — Wt 188.1 lb

## 2016-01-25 DIAGNOSIS — J8410 Pulmonary fibrosis, unspecified: Secondary | ICD-10-CM | POA: Diagnosis not present

## 2016-01-25 NOTE — Progress Notes (Signed)
Daily Session Note  Patient Details  Name: Bradley Bennett MRN: [id number] Date of Birth: 12-12-1932 Referring Provider:   [name] Pulmonary Rehab Walk Test from 11/09/2015 in [place]  Referring Provider  Dr. Alva Garnet      Encounter Date: 01/25/2016  Check In:     Session Check In - 01/25/16 1211      Check-In   Location [facility]   Staff Present Su Hilt, MS, ACSM RCEP, Exercise Physiologist;Maria Whitaker, RN, Luisa Hart, RN, BSN;Ramon Dredge, RN, [facility]   Supervising physician immediately available to respond to emergencies [facility] immediately available   Physician(s) Dr. Carles Collet   Medication changes reported     No   Fall or balance concerns reported    No   Warm-up and Cool-down Performed as group-led instruction   Resistance Training Performed Yes   VAD Patient? No     Pain Assessment   Currently in Pain? No/denies   Multiple Pain Sites No      Capillary Blood Glucose: No results found for this or any previous visit (from the past 24 hour(s)).      Exercise Prescription Changes - 01/25/16 1200      Response to Exercise   Blood Pressure (Admit) 110/60   Blood Pressure (Exercise) 132/74   Blood Pressure (Exit) 106/68   Heart Rate (Admit) 88 bpm   Heart Rate (Exercise) 105 bpm   Heart Rate (Exit) 84 bpm   Oxygen Saturation (Admit) 100 %   Oxygen Saturation (Exercise) 88 %   Oxygen Saturation (Exit) 100 %   Rating of Perceived Exertion (Exercise) 12   Perceived Dyspnea (Exercise) 2   Duration Progress to 45 minutes of aerobic exercise without signs/symptoms of physical distress   Intensity THRR unchanged     Progression   Progression Continue to progress workloads to maintain intensity without signs/symptoms of physical distress.     Resistance Training   Training Prescription Yes   Weight green bands   Reps 10-12  10 minutes of sttrength training     Interval Training    Interval Training No     Oxygen   Oxygen Continuous   Liters 10     NuStep   Level 5   Minutes 17   METs 1.8     Arm Ergometer   Level 4   Minutes 17     Track   Laps 7   Minutes 17     Goals Met:  Exercise tolerated well No report of cardiac concerns or symptoms Strength training completed today  Goals Unmet:  Not Applicable  Comments: Service time is from 10:30am to 12:00pm    Dr. Rush Farmer is Medical Director for Pulmonary Rehab at [facility].

## 2016-01-27 ENCOUNTER — Encounter (HOSPITAL_COMMUNITY)
Admission: RE | Admit: 2016-01-27 | Discharge: 2016-01-27 | Disposition: A | Discharge status: Home / Self Care | Payer: Medicare Other | Source: Ambulatory Visit | Attending: Pulmonary Disease | Admitting: Pulmonary Disease

## 2016-01-27 VITALS — Wt 187.6 lb

## 2016-01-27 DIAGNOSIS — J8410 Pulmonary fibrosis, unspecified: Secondary | ICD-10-CM | POA: Diagnosis not present

## 2016-01-27 NOTE — Progress Notes (Signed)
Daily Session Note  Patient Details  Name: Bradley Bennett MRN: [id number] Date of Birth: [birth year] Referring Provider:   [name] Pulmonary Rehab Walk Test from 11/09/2015 in [place]  Referring Provider  Dr. Alva Garnet      Encounter Date: 01/27/2016  Check In:     Session Check In - 01/27/16 1004      Check-In   Location [facility]   Staff Present Su Hilt, MS, ACSM RCEP, Exercise Physiologist;[place] Fant Rollene Rotunda, RN, Maxcine Ham, RN, BSN   Supervising physician immediately available to respond to emergencies [facility] immediately available   Physician(s) Dr. Ree Kida   Medication changes reported     No   Fall or balance concerns reported    No   Warm-up and Cool-down Performed as group-led instruction   Resistance Training Performed Yes   VAD Patient? No     Pain Assessment   Currently in Pain? No/denies   Multiple Pain Sites No      Capillary Blood Glucose: No results found for this or any previous visit (from the past 24 hour(s)).      Exercise Prescription Changes - 01/27/16 1215      Response to Exercise   Blood Pressure (Admit) 98/50   Blood Pressure (Exercise) 142/70   Blood Pressure (Exit) 116/74   Heart Rate (Admit) 81 bpm   Heart Rate (Exercise) 96 bpm   Heart Rate (Exit) 90 bpm   Oxygen Saturation (Admit) 94 %   Oxygen Saturation (Exercise) 90 %   Oxygen Saturation (Exit) 98 %   Rating of Perceived Exertion (Exercise) 13   Perceived Dyspnea (Exercise) 2   Duration Progress to 45 minutes of aerobic exercise without signs/symptoms of physical distress   Intensity THRR unchanged     Progression   Progression Continue to progress workloads to maintain intensity without signs/symptoms of physical distress.     Resistance Training   Training Prescription Yes   Weight green bands   Reps 10-12  10 minutes of sttrength training     Interval Training   Interval Training No     Oxygen   Oxygen Continuous   Liters 10     Arm Ergometer   Level 4   Minutes 17     Track   Laps 8   Minutes 17     Goals Met:  [place] with exercise equipment Improved SOB with ADL's Using PLB without cueing & demonstrates good technique Exercise tolerated well Strength training completed today  Goals Unmet:  Not Applicable  Comments: Service time is from 1030 to 1200   Dr. Rush Farmer is Medical Director for Pulmonary Rehab at [facility].

## 2016-02-01 ENCOUNTER — Encounter (HOSPITAL_COMMUNITY)
Admission: RE | Admit: 2016-02-01 | Discharge: 2016-02-01 | Disposition: A | Discharge status: Home / Self Care | Payer: Medicare Other | Source: Ambulatory Visit | Attending: Pulmonary Disease | Admitting: Pulmonary Disease

## 2016-02-01 ENCOUNTER — Telehealth (HOSPITAL_COMMUNITY): Payer: Self-pay | Admitting: *Deleted

## 2016-02-01 VITALS — Wt 188.1 lb

## 2016-02-01 DIAGNOSIS — J8410 Pulmonary fibrosis, unspecified: Secondary | ICD-10-CM

## 2016-02-01 NOTE — Progress Notes (Signed)
Daily Session Note  Patient Details  Name: Bradley Bennett MRN: [id number] Date of Birth: [birth year] Referring Provider:   [name] Pulmonary Rehab Walk Test from 11/09/2015 in [place]  Referring Provider  Dr. Alva Garnet      Encounter Date: 02/01/2016  Check In:     Session Check In - 02/01/16 1019      Check-In   Location [facility]   Staff Present Rosebud Poles, RN, BSN;Ramon Dredge, RN, MHA;[place] Rollene Rotunda, RN, BSN   Supervising physician immediately available to respond to emergencies [facility] immediately available   Physician(s) Dr. Ree Kida   Medication changes reported     No   Fall or balance concerns reported    No   Warm-up and Cool-down Performed as group-led instruction   Resistance Training Performed Yes   VAD Patient? No     Pain Assessment   Currently in Pain? No/denies   Multiple Pain Sites No      Capillary Blood Glucose: No results found for this or any previous visit (from the past 24 hour(s)).      Exercise Prescription Changes - 02/01/16 1200      Response to Exercise   Blood Pressure (Admit) 102/40   Blood Pressure (Exercise) 130/60   Blood Pressure (Exit) 104/60   Heart Rate (Admit) 80 bpm   Heart Rate (Exercise) 101 bpm   Heart Rate (Exit) 79 bpm   Oxygen Saturation (Admit) 98 %   Oxygen Saturation (Exercise) 90 %   Oxygen Saturation (Exit) 98 %   Rating of Perceived Exertion (Exercise) 12   Perceived Dyspnea (Exercise) 2   Duration Progress to 45 minutes of aerobic exercise without signs/symptoms of physical distress   Intensity THRR unchanged     Progression   Progression Continue to progress workloads to maintain intensity without signs/symptoms of physical distress.     Resistance Training   Training Prescription Yes   Weight green bands   Reps 10-12  10 minutes of strength training     Interval Training   Interval Training No     Oxygen   Oxygen  Continuous   Liters 8-10     NuStep   Level 5   Minutes 17   METs 1.7     Arm Ergometer   Level 4   Minutes 17     Track   Laps 8   Minutes 17     Goals Met:  Exercise tolerated well Strength training completed today  Goals Unmet:  Not Applicable  Comments: Service time is from 1030 to 1200    Dr. Rush Farmer is Medical Director for Pulmonary Rehab at [facility].

## 2016-02-01 NOTE — Telephone Encounter (Signed)
Patient's daughter was given detailed instructions per Myocardial Perfusion Study Information Sheet for the test on 02/04/16 at 0845 per DPR. Patient notified to arrive 15 minutes early and that it is imperative to arrive on time for appointment to keep from having the test rescheduled.  If you need to cancel or reschedule your appointment, please call the office within 24 hours of your appointment. Failure to do so may result in a cancellation of your appointment, and a $50 no show fee. Patient verbalized understanding.Wiatt Mahabir, [place]

## 2016-02-03 ENCOUNTER — Encounter (HOSPITAL_COMMUNITY)
Admission: RE | Admit: 2016-02-03 | Discharge: 2016-02-03 | Disposition: A | Discharge status: Home / Self Care | Payer: Medicare Other | Source: Ambulatory Visit | Attending: Pulmonary Disease | Admitting: Pulmonary Disease

## 2016-02-03 ENCOUNTER — Other Ambulatory Visit: Payer: Self-pay | Admitting: Neurology

## 2016-02-03 VITALS — Wt 187.4 lb

## 2016-02-03 DIAGNOSIS — J8410 Pulmonary fibrosis, unspecified: Secondary | ICD-10-CM

## 2016-02-03 MED ORDER — CARBIDOPA-LEVODOPA ER 50-200 MG PO TBCR: 1.0000 | EXTENDED_RELEASE_TABLET | Freq: Every day | ORAL | 1 refills | Status: DC

## 2016-02-03 NOTE — Progress Notes (Signed)
Pulmonary Individual Treatment Plan  Patient Details  Name: Bradley Bennett MRN: [id number] Date of Birth: 1933-01-21 Referring Provider:   [name] Pulmonary Rehab Walk Test from 11/09/2015 in [place]  Referring Provider  Dr. Alva Garnet      Initial Encounter Date:  Flowsheet Row Pulmonary Rehab Walk Test from 11/09/2015 in [place]  Date  11/11/15  Referring Provider  Dr. 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  acetaminophen (TYLENOL) 500 MG tablet, Take 500 mg by mouth as needed., Disp: , Rfl:  .  atorvastatin (LIPITOR) 10 MG tablet, Take 10 mg by mouth daily., Disp: , Rfl:  .  busPIRone (BUSPAR) 5 MG tablet, TAKE ONE TABLET BY MOUTH ONCE DAILY, Disp: 90 tablet, Rfl: 1 .  carbidopa-levodopa (SINEMET CR) 50-200 MG tablet, Take 1 tablet by mouth at bedtime., Disp: 90 tablet, Rfl: 1 .  carbidopa-levodopa (SINEMET IR) 25-100 MG tablet, Take 2 tablets by mouth 3 (three) times daily., Disp: 540 tablet, Rfl: 1 .  clopidogrel (PLAVIX) 75 MG tablet, Take 75 mg by mouth daily., Disp: , Rfl:  .  clotrimazole (MYCELEX) 10 MG troche, Take 1 tablet (10 mg total) by mouth 5 (five) times daily., Disp: 50 tablet, Rfl: 0 .  finasteride (PROSCAR) 5 MG tablet, Take 1 tablet (5 mg total) by mouth daily., Disp: 90 tablet, Rfl: 1 .  fluticasone (FLONASE) 50 MCG/ACT nasal spray, Place 2 sprays into both nostrils daily., Disp: 16 g, Rfl: 6 .  Fluticasone-Salmeterol (ADVAIR DISKUS) 500-50 MCG/DOSE AEPB, Inhale 1 puff into the lungs 2 (two) times daily., Disp: , Rfl:  .  levothyroxine (SYNTHROID, LEVOTHROID) 150 MCG tablet, Take 1 tablet (150 mcg total) by mouth daily., Disp: 90 tablet, Rfl: 3 .  mirabegron ER (MYRBETRIQ) 25 MG TB24 tablet, Take 1 tablet (25 mg total) by mouth daily., Disp: 90 tablet, Rfl: 1 .  Multiple  Vitamins-Minerals (CENTRUM VITAMINTS PO), Take 1 tablet by mouth daily. , Disp: , Rfl:  .  tamsulosin (FLOMAX) 0.4 MG CAPS capsule, Take 0.4 mg by mouth daily. , Disp: , Rfl:  .  traZODone (DESYREL) 50 MG tablet, Take 1.5-2 tablets (75-100 mg total) by mouth at bedtime., Disp: 180 tablet, Rfl: 1 .  traZODone (DESYREL) 50 MG tablet, TAKE ONE TABLET BY MOUTH AT BEDTIME, Disp: 90 tablet, Rfl: 1 .  umeclidinium bromide (INCRUSE ELLIPTA) 62.5 MCG/INH AEPB, Inhale 1 puff into the lungs daily., Disp: , Rfl:   Past Medical History: Past Medical History:  Diagnosis Date  . Arthritis   . Asthma   . Coronary artery disease   . Emphysema of lung ([place])   . Hearing difficulty of both ears   . Heart attack   . Heart murmur   . Hyperlipidemia   . Hypertension   . ILD (interstitial lung disease) ([place])   . Parkinson disease ([place])     Tobacco Use: History  Smoking Status  . Former Smoker  . Packs/day: 2.00  . Years: 35.00  . Quit date: 08/15/1985  Smokeless Tobacco  . Never Used    Comment: quit smoking in 1992    Labs: Recent Review Flowsheet Data    Labs for ITP Cardiac and Pulmonary Rehab Latest Ref Rng & Units 07/23/2015   Cholestrol 0 - 200 mg/dL 172   LDLCALC 0 - 99 mg/dL 92   HDL >39.00 mg/dL  57.60   Trlycerides 0.0 - 149.0 mg/dL 108.0   Hemoglobin A1c 4.6 - 6.5 % 5.7      Capillary Blood Glucose: No results found for: GLUCAP   ADL UCSD:     Pulmonary Assessment Scores    Row Name 11/16/15 1617         ADL UCSD   ADL Phase Entry     SOB Score total 86        Pulmonary Function Assessment:     Pulmonary Function Assessment - 11/08/15 1100      Breath   Bilateral Breath Sounds Clear   Shortness of Breath Yes      Exercise Target Goals:    Exercise Program Goal: Individual exercise prescription set with THRR, safety & activity barriers. Participant demonstrates ability to understand and report RPE using BORG scale, to self-measure pulse accurately, and to  acknowledge the importance of the exercise prescription.  Exercise Prescription Goal: Starting with aerobic activity 30 plus minutes a day, 3 days per week for initial exercise prescription. Provide home exercise prescription and guidelines that participant acknowledges understanding prior to discharge.  Activity Barriers & Risk Stratification:     Activity Barriers & Cardiac Risk Stratification - 11/08/15 1059      Activity Barriers & Cardiac Risk Stratification   Activity Barriers Arthritis      6 Minute Walk:     6 Minute Walk    Row Name 11/11/15 0656         6 Minute Walk   Phase Initial     Distance 825 feet     Walk Time 4.55 minutes  4 minutes and 55 seconds     # of Rest Breaks 2  first rest break: 45 seconds Second Break: 20 seconds     MPH 1.56     METS 2.23     RPE 11     Perceived Dyspnea  1     Symptoms No     Resting HR 101 bpm     Resting BP 100/70     Max Ex. HR 122 bpm     Max Ex. BP 102/74       Interval HR   Baseline HR 101     1 Minute HR 102     2 Minute HR 103     3 Minute HR 115     4 Minute HR 107     5 Minute HR 122     6 Minute HR 120     2 Minute Post HR 113     Interval Heart Rate? Yes       Interval Oxygen   Interval Oxygen? Yes     Baseline Oxygen Saturation % 93 %     Baseline Liters of Oxygen 4 L     1 Minute Oxygen Saturation % 88 %     1 Minute Liters of Oxygen 4 L     2 Minute Oxygen Saturation % 81 %     2 Minute Liters of Oxygen 4 L     3 Minute Oxygen Saturation % 87 %     3 Minute Liters of Oxygen 8 L     4 Minute Oxygen Saturation % 81 %     4 Minute Liters of Oxygen 8 L     5 Minute Oxygen Saturation % 85 %     5 Minute Liters of Oxygen 8 L     6 Minute Oxygen Saturation % 91 %  6 Minute Liters of Oxygen 8 L        Initial Exercise Prescription:     Initial Exercise Prescription - 11/11/15 0600      Date of Initial Exercise RX and Referring Provider   Date 11/11/15   Referring Provider Dr.  Alva Garnet     Oxygen   Oxygen Continuous   Liters 6-8     NuStep   Level 2   Minutes 17   METs 1.5     Arm Ergometer   Level 2   Minutes 17     Track   Laps 4   Minutes 17     Prescription Details   Frequency (times per week) 2   Duration Progress to 45 minutes of aerobic exercise without signs/symptoms of physical distress     Intensity   THRR 40-80% of Max Heartrate 56-111   Ratings of Perceived Exertion 11-13   Perceived Dyspnea 0-4     Progression   Progression Continue progressive overload as per policy without signs/symptoms or physical distress.     Resistance Training   Training Prescription Yes   Weight GREEN BANDS   Reps 10-12      Perform Capillary Blood Glucose checks as needed.  Exercise Prescription Changes:     Exercise Prescription Changes    Row Name 11/11/15 1200 11/16/15 1200 11/18/15 1200 11/23/15 1200 11/25/15 1200     Response to Exercise   Blood Pressure (Admit) 112/72 114/70 110/56 110/62 104/56   Blood Pressure (Exercise) 150/80 130/66 118/80 134/64 132/72   Blood Pressure (Exit) 100/60 102/60 114/60 102/64 104/60   Heart Rate (Admit) 77 bpm 93 bpm 94 bpm 90 bpm 86 bpm   Heart Rate (Exercise) 114 bpm 111 bpm 90 bpm 83 bpm 91 bpm   Heart Rate (Exit) 88 bpm 90 bpm 92 bpm 83 bpm 75 bpm   Oxygen Saturation (Admit) 98 % 95 % 97 % 99 % 95 %   Oxygen Saturation (Exercise) 86 % 82 % 94 % 82 % 94 %   Oxygen Saturation (Exit) 99 % 97 % 97 % 100 % 95 %   Rating of Perceived Exertion (Exercise) 12 13 11 13 12    Perceived Dyspnea (Exercise) 1 1 1 2 1    Duration Progress to 45 minutes of aerobic exercise without signs/symptoms of physical distress Progress to 45 minutes of aerobic exercise without signs/symptoms of physical distress Progress to 45 minutes of aerobic exercise without signs/symptoms of physical distress Progress to 45 minutes of aerobic exercise without signs/symptoms of physical distress Progress to 45 minutes of aerobic exercise  without signs/symptoms of physical distress   Intensity -  40-80% of THRR -  40-80% of THRR -  40-80% of THRR THRR unchanged THRR unchanged     Progression   Progression Continue to progress workloads to maintain intensity without signs/symptoms of physical distress. Continue to progress workloads to maintain intensity without signs/symptoms of physical distress. Continue to progress workloads to maintain intensity without signs/symptoms of physical distress. Continue to progress workloads to maintain intensity without signs/symptoms of physical distress. Continue to progress workloads to maintain intensity without signs/symptoms of physical distress.     Resistance Training   Training Prescription Yes Yes Yes Yes Yes   Weight green bands green bands green bands green bands green bands   Reps -  10 minutes of strength training -  10 minutes of strength training -  10 minutes of strength training 10-12  10 minutes of strength training  10-12  10 minutes of strength training     Interval Training   Interval Training  -  -  - No No     Oxygen   Oxygen Continuous Continuous Continuous Continuous Continuous   Liters 8 8 8 8 8      NuStep   Level 2 4 4 2 2    Minutes 17 17 17 17 17    METs  - 2 1.8 1.6 1.7     Arm Ergometer   Level  - 2 2 2 2    Minutes  - 17 17 17 17      Track   Laps 8 8  - 11  -   Minutes [place] Name 11/30/15 1226 12/02/15 1324 12/09/15 1200 12/14/15 1200 12/16/15 1354     Exercise Review   Progression Yes  -  -  -  -     Response to Exercise   Blood Pressure (Admit) 118/60 82/60 121/72 122/60 100/60   Blood Pressure (Exercise) 130/70 140/74 136/70 124/64 118/64   Blood Pressure (Exit) 122/68 98/54 112/60 106/60 100/60   Heart Rate (Admit) 91 bpm 78 bpm 89 bpm 94 bpm 95 bpm   Heart Rate (Exercise) 105 bpm 85 bpm 107 bpm 112 bpm 110 bpm   Heart Rate (Exit) 93 bpm 75 bpm 91 bpm 93 bpm 92 bpm   Oxygen Saturation (Admit) 94 % 98 % 97 % 97 % 92 %    Oxygen Saturation (Exercise) 83 %  increased to 93 with o2 increase to 10L 94 % 88 % 93 % 92 %   Oxygen Saturation (Exit) 96 % 96 % 99 % 98 % 95 %   Rating of Perceived Exertion (Exercise) 12 12 11 14 14    Perceived Dyspnea (Exercise) 1 1 1 1 2    Duration Progress to 45 minutes of aerobic exercise without signs/symptoms of physical distress Progress to 45 minutes of aerobic exercise without signs/symptoms of physical distress Progress to 45 minutes of aerobic exercise without signs/symptoms of physical distress Progress to 45 minutes of aerobic exercise without signs/symptoms of physical distress Progress to 45 minutes of aerobic exercise without signs/symptoms of physical distress   Intensity THRR unchanged THRR unchanged THRR unchanged THRR unchanged THRR unchanged     Progression   Progression Continue to progress workloads to maintain intensity without signs/symptoms of physical distress. Continue to progress workloads to maintain intensity without signs/symptoms of physical distress. Continue to progress workloads to maintain intensity without signs/symptoms of physical distress. Continue to progress workloads to maintain intensity without signs/symptoms of physical distress. Continue to progress workloads to maintain intensity without signs/symptoms of physical distress.     Resistance Training   Training Prescription Yes Yes Yes Yes Yes   Weight green bands green bands greeen bands greeen bands greeen bands   Reps 10-12  10 minutes of strength training 10-12  10 minutes of strength training 10-12  10 minutes of strength training 10-12  10 minutes of strength training 10-12  10 minutes of strength training     Interval Training   Interval Training No No  - No No     Oxygen   Oxygen Continuous Continuous Continuous Continuous Continuous   Liters 8  increased to 10 on track, 4 at rest 8  increased to 10 on track, 4 at rest 8 8-10 8-10     NuStep   Level 3 3 3 3 3    Minutes 17 17  17 17 17    METs 1.6 1.6 1.6 1.4 1.6     Arm Ergometer   Level 3 3  - 3 3   Minutes 17 17  - 17 17     Track   Laps 8  - 13 12  -   Minutes 17  - 17 17  -     Home Exercise Plan   Plans to continue exercise at  -  -  - Home Home   Frequency  -  -  - Add 3 additional days to program exercise sessions. Add 3 additional days to program exercise sessions.   Row Name 12/21/15 1200 12/23/15 1200 01/04/16 1200 01/06/16 1200 01/13/16 1200     Exercise Review   Progression Yes  -  -  -  -     Response to Exercise   Blood Pressure (Admit) 120/64 98/50 114/58 126/64 124/72   Blood Pressure (Exercise) 126/70 100/60 126/70 142/86 140/80   Blood Pressure (Exit) 106/64 100/60 123/75 112/76 106/70   Heart Rate (Admit) 104 bpm 94 bpm 78 bpm 89 bpm 94 bpm   Heart Rate (Exercise) 114 bpm 102 bpm 101 bpm 108 bpm 104 bpm   Heart Rate (Exit) 95 bpm 93 bpm 88 bpm 88 bpm 101 bpm   Oxygen Saturation (Admit) 90 % 99 % 100 % 99 % 96 %   Oxygen Saturation (Exercise) 91 % 88 % 92 % 89 % 89 %   Oxygen Saturation (Exit) 97 % 98 % 99 % 99 % 95 %   Rating of Perceived Exertion (Exercise) 12 11 15 12 11    Perceived Dyspnea (Exercise) 1 2 1 1 1    Duration Progress to 45 minutes of aerobic exercise without signs/symptoms of physical distress Progress to 45 minutes of aerobic exercise without signs/symptoms of physical distress Progress to 45 minutes of aerobic exercise without signs/symptoms of physical distress Progress to 45 minutes of aerobic exercise without signs/symptoms of physical distress Progress to 45 minutes of aerobic exercise without signs/symptoms of physical distress   Intensity THRR unchanged THRR unchanged THRR unchanged THRR unchanged THRR unchanged     Progression   Progression Continue to progress workloads to maintain intensity without signs/symptoms of physical distress. Continue to progress workloads to maintain intensity without signs/symptoms of physical distress. Continue to progress  workloads to maintain intensity without signs/symptoms of physical distress. Continue to progress workloads to maintain intensity without signs/symptoms of physical distress. Continue to progress workloads to maintain intensity without signs/symptoms of physical distress.     Resistance Training   Training Prescription Yes Yes Yes Yes Yes   Weight greeen bands greeen bands green bands green bands green bands   Reps 10-12  10 minutes of strength training 10-12  10 minutes of strength training 10-12  10 minutes of strength training 10-12  10 minutes of strength training 10-12  10 minutes of sttrength training     Interval Training   Interval Training No No No No No     Oxygen   Oxygen  -  - Continuous Continuous Continuous   Liters  -  - 8-10 10 10      NuStep   Level 3  - 4 4 4    Minutes 17  - 17 17 17    METs 1.6  - 1.7 1.7 1.6     Arm Ergometer   Level 3 4 4   -  -   Minutes 17 17 17   -  -  Track   Laps 8 8 9 8 8    Minutes 17 17 17 17 17    Row Name 01/18/16 1213 01/20/16 1200 01/25/16 1200 01/27/16 1215 02/01/16 1200     Exercise Review   Progression Yes  -  -  -  -     Response to Exercise   Blood Pressure (Admit) 110/56 130/72 110/60 98/50 102/40   Blood Pressure (Exercise) 116/60 120/70 132/74 142/70 130/60   Blood Pressure (Exit) 132/82  114/80 reck 110/60 106/68 116/74 104/60   Heart Rate (Admit) 91 bpm 92 bpm 88 bpm 81 bpm 80 bpm   Heart Rate (Exercise) 107 bpm 89 bpm 105 bpm 96 bpm 101 bpm   Heart Rate (Exit) 81 bpm 76 bpm 84 bpm 90 bpm 79 bpm   Oxygen Saturation (Admit) 100 % 95 % 100 % 94 % 98 %   Oxygen Saturation (Exercise) 88 % 94 % 88 % 90 % 90 %   Oxygen Saturation (Exit) 98 % 96 % 100 % 98 % 98 %   Rating of Perceived Exertion (Exercise) 11 12 12 13 12    Perceived Dyspnea (Exercise) 1 1 2 2 2    Duration Progress to 45 minutes of aerobic exercise without signs/symptoms of physical distress Progress to 45 minutes of aerobic exercise without  signs/symptoms of physical distress Progress to 45 minutes of aerobic exercise without signs/symptoms of physical distress Progress to 45 minutes of aerobic exercise without signs/symptoms of physical distress Progress to 45 minutes of aerobic exercise without signs/symptoms of physical distress   Intensity THRR unchanged THRR unchanged THRR unchanged THRR unchanged THRR unchanged     Progression   Progression Continue to progress workloads to maintain intensity without signs/symptoms of physical distress. Continue to progress workloads to maintain intensity without signs/symptoms of physical distress. Continue to progress workloads to maintain intensity without signs/symptoms of physical distress. Continue to progress workloads to maintain intensity without signs/symptoms of physical distress. Continue to progress workloads to maintain intensity without signs/symptoms of physical distress.     Resistance Training   Training Prescription Yes Yes Yes Yes Yes   Weight green bands green bands green bands green bands green bands   Reps 10-12  10 minutes of sttrength training 10-12  10 minutes of sttrength training 10-12  10 minutes of sttrength training 10-12  10 minutes of sttrength training 10-12  10 minutes of strength training     Interval Training   Interval Training No No No No No     Oxygen   Oxygen Continuous Continuous Continuous Continuous Continuous   Liters 10 10 10 10  8-10     NuStep   Level 5 5 5   - 5   Minutes 17 17 17   - 17   METs 1.8 1.8 1.8  - 1.7     Arm Ergometer   Level 4 4 4 4 4    Minutes 17 17 17 17 17      Track   Laps 10  - 7 8 8    Minutes 17  - 17 17 17       Exercise Comments:     Exercise Comments    Row Name 11/15/15 1021 12/13/15 1644 12/14/15 1550 01/10/16 1000 01/25/16 0852   Exercise Comments Patient has only attended one sessions. Will cont. to monitor.  Patient is slowly progressing in Pulmonary Rehab. We have had to adjust his oxygen needs  during exercise. He is now on 10 liters while walking. Will cont. to monitor and progress.  home exercise completed Patient is cont. to improve in workload intensities. O2 is now managed well. Will cont. to monitor.  Patient is cont. to improve in workload intensities. O2 is now managed well. Will cont. to monitor.       Discharge Exercise Prescription (Final Exercise Prescription Changes):     Exercise Prescription Changes - 02/01/16 1200      Response to Exercise   Blood Pressure (Admit) 102/40   Blood Pressure (Exercise) 130/60   Blood Pressure (Exit) 104/60   Heart Rate (Admit) 80 bpm   Heart Rate (Exercise) 101 bpm   Heart Rate (Exit) 79 bpm   Oxygen Saturation (Admit) 98 %   Oxygen Saturation (Exercise) 90 %   Oxygen Saturation (Exit) 98 %   Rating of Perceived Exertion (Exercise) 12   Perceived Dyspnea (Exercise) 2   Duration Progress to 45 minutes of aerobic exercise without signs/symptoms of physical distress   Intensity THRR unchanged     Progression   Progression Continue to progress workloads to maintain intensity without signs/symptoms of physical distress.     Resistance Training   Training Prescription Yes   Weight green bands   Reps 10-12  10 minutes of strength training     Interval Training   Interval Training No     Oxygen   Oxygen Continuous   Liters 8-10     NuStep   Level 5   Minutes 17   METs 1.7     Arm Ergometer   Level 4   Minutes 17     Track   Laps 8   Minutes 17       Nutrition:  Target Goals: Understanding of nutrition guidelines, daily intake of sodium 1500mg , cholesterol 200mg , calories 30% from fat and 7% or less from saturated fats, daily to have 5 or more servings of fruits and vegetables.  Biometrics:     Pre Biometrics - 11/08/15 1102      Pre Biometrics   Grip Strength 30 kg       Nutrition Therapy Plan and Nutrition Goals:   Nutrition Discharge: Rate Your Plate Scores:   Psychosocial: Target Goals:  Acknowledge presence or absence of depression, maximize coping skills, provide positive support system. Participant is able to verbalize types and ability to use techniques and skills needed for reducing stress and depression.  Initial Review & Psychosocial Screening:     Initial Psych Review & Screening - 11/08/15 [place]? Yes  daughters live in [place]     Barriers   Psychosocial barriers to participate in program There are no identifiable barriers or psychosocial needs.     Screening Interventions   Interventions Encouraged to exercise      Quality of Life Scores:     Quality of Life - 11/16/15 1618      Quality of Life Scores   Health/Function Pre 14.43 %   Socioeconomic Pre 21.36 %   Psych/Spiritual Pre 16.64 %   Family Pre 20.5 %   GLOBAL Pre 17.11 %      PHQ-9: Recent Review Flowsheet Data    Depression screen PHQ 2/9 11/08/2015   Decreased Interest 0   Down, Depressed, Hopeless 0   PHQ - 2 Score 0      Psychosocial Evaluation and Intervention:     Psychosocial Evaluation - 11/08/15 1106      Psychosocial Evaluation & Interventions   Continued Psychosocial Services Needed --  none have been needed in the past, none needed now      Psychosocial Re-Evaluation:     Psychosocial Re-Evaluation    [place] Name 11/16/15 [phone] 12/14/15 0659 01/11/16 0719 02/01/16 0839       Psychosocial Re-Evaluation   Interventions Encouraged to attend Pulmonary Rehabilitation for the exercise Encouraged to attend Pulmonary Rehabilitation for the exercise Encouraged to attend Pulmonary Rehabilitation for the exercise Encouraged to attend Pulmonary Rehabilitation for the exercise    Comments no psychosocial barriers identified at this time no psychosocial barriers identified at this time no psychosocial barriers identified at this time no psychosocial barriers identified at this time      Education: Education Goals:  Education classes will be provided on a weekly basis, covering required topics. Participant will state understanding/return demonstration of topics presented.  Learning Barriers/Preferences:     Learning Barriers/Preferences - 11/08/15 1059      Learning Barriers/Preferences   Learning Barriers None   Learning Preferences Audio;Group Instruction;Individual Instruction;Skilled Demonstration;Verbal Instruction      Education Topics: Risk Factor Reduction:  -Group instruction that is supported by a PowerPoint presentation. Instructor discusses the definition of a risk factor, different risk factors for pulmonary disease, and how the heart and lungs work together.     Nutrition for Pulmonary Patient:  -Group instruction provided by PowerPoint slides, verbal discussion, and written materials to support subject matter. The instructor gives an explanation and review of healthy diet recommendations, which includes a discussion on weight management, recommendations for fruit and vegetable consumption, as well as protein, fluid, caffeine, fiber, sodium, sugar, and alcohol. Tips for eating when patients are short of breath are discussed. Flowsheet Row PULMONARY REHAB OTHER RESPIRATORY from 01/27/2016 in [place]  Date  12/16/15 [facility] Eating During the [place]  Educator  RD  Instruction Review Code  2- meets goals/outcomes      Pursed Lip Breathing:  -Group instruction that is supported by demonstration and informational handouts. Instructor discusses the benefits of pursed lip and diaphragmatic breathing and detailed demonstration on how to preform both.   Flowsheet Row PULMONARY REHAB OTHER RESPIRATORY from 01/27/2016 in [place]  Date  01/26/06  Educator  ep  Instruction Review Code  2- meets goals/outcomes      Oxygen Safety:  -Group instruction provided by PowerPoint, verbal discussion, and written material to  support subject matter. There is an overview of "What is Oxygen" and "Why do we need it".  Instructor also reviews how to create a safe environment for oxygen use, the importance of using oxygen as prescribed, and the risks of noncompliance. There is a brief discussion on traveling with oxygen and resources the patient may utilize. Flowsheet Row PULMONARY REHAB OTHER RESPIRATORY from 01/27/2016 in [place]  Date  12/09/15  Educator  RN  Instruction Review Code  2- meets goals/outcomes      Oxygen Equipment:  -Group instruction provided by [facility] Staff utilizing handouts, written materials, and equipment demonstrations. Flowsheet Row PULMONARY REHAB OTHER RESPIRATORY from 01/27/2016 in [place]  Date  12/23/15  Educator  rep  Instruction Review Code  2- meets goals/outcomes      Signs and Symptoms:  -Group instruction provided by written material and verbal discussion to support subject matter. Warning signs and symptoms of infection, stroke, and heart attack are reviewed and when to call the physician/911 reinforced. Tips for preventing the spread of infection  discussed. Flowsheet Row PULMONARY REHAB OTHER RESPIRATORY from 01/27/2016 in [place]  Date  01/13/16  Educator  RN  Instruction Review Code  2- meets goals/outcomes      Advanced Directives:  -Group instruction provided by verbal instruction and written material to support subject matter. Instructor reviews Advanced Directive laws and proper instruction for filling out document.   Pulmonary Video:  -Group video education that reviews the importance of medication and oxygen compliance, exercise, good nutrition, pulmonary hygiene, and pursed lip and diaphragmatic breathing for the pulmonary patient. Flowsheet Row PULMONARY REHAB OTHER RESPIRATORY from 01/27/2016 in [place]  Date  11/25/15   Educator  video  Instruction Review Code  2- meets goals/outcomes      Exercise for the Pulmonary Patient:  -Group instruction that is supported by a PowerPoint presentation. Instructor discusses benefits of exercise, core components of exercise, frequency, duration, and intensity of an exercise routine, importance of utilizing pulse oximetry during exercise, safety while exercising, and options of places to exercise outside of rehab.   Flowsheet Row PULMONARY REHAB OTHER RESPIRATORY from 01/27/2016 in [place]  Date  01/06/16  Educator  EP  Instruction Review Code  2- meets goals/outcomes      Pulmonary Medications:  -Verbally interactive group education provided by instructor with focus on inhaled medications and proper administration.   Anatomy and Physiology of the Respiratory System and Intimacy:  -Group instruction provided by PowerPoint, verbal discussion, and written material to support subject matter. Instructor reviews respiratory cycle and anatomical components of the respiratory system and their functions. Instructor also reviews differences in obstructive and restrictive respiratory diseases with examples of each. Intimacy, Sex, and Sexuality differences are reviewed with a discussion on how relationships can change when diagnosed with pulmonary disease. Common sexual concerns are reviewed. Flowsheet Row PULMONARY REHAB OTHER RESPIRATORY from 01/27/2016 in [place]  Date  01/20/16  Educator  RN  Instruction Review Code  2- meets goals/outcomes      Knowledge Questionnaire Score:     Knowledge Questionnaire Score - 11/16/15 1617      Knowledge Questionnaire Score   Pre Score 12/13      Core Components/Risk Factors/Patient Goals at Admission:     Personal Goals and Risk Factors at Admission - 11/08/15 1102      Core Components/Risk Factors/Patient Goals on Admission   Increase Strength and  Stamina Yes   Intervention Provide advice, education, support and counseling about physical activity/exercise needs.;Develop an individualized exercise prescription for aerobic and resistive training based on initial evaluation findings, risk stratification, comorbidities and participant's personal goals.   Expected Outcomes Achievement of increased cardiorespiratory fitness and enhanced flexibility, muscular endurance and strength shown through measurements of functional capacity and personal statement of participant.   Improve shortness of breath with ADL's Yes   Intervention Provide education, individualized exercise plan and daily activity instruction to help decrease symptoms of SOB with activities of daily living.   Expected Outcomes Short Term: Achieves a reduction of symptoms when performing activities of daily living.   Develop more efficient breathing techniques such as purse lipped breathing and diaphragmatic breathing; and practicing self-pacing with activity Yes   Intervention Provide education, demonstration and support about specific breathing techniuqes utilized for more efficient breathing. Include techniques such as pursed lipped breathing, diaphragmatic breathing and self-pacing activity.   Expected Outcomes Short Term: Participant will be able to demonstrate and use  breathing techniques as needed throughout daily activities.      Core Components/Risk Factors/Patient Goals Review:      Goals and Risk Factor Review    Row Name 11/16/15 0655 12/14/15 0659 01/11/16 0719 02/01/16 [id number]       Core Components/Risk Factors/Patient Goals Review   Personal Goals Review Increase Strength and Stamina;Improve shortness of breath with ADL's;Develop more efficient breathing techniques such as purse lipped breathing and diaphragmatic breathing and practicing self-pacing with activity. Increase Strength and Stamina;Improve shortness of breath with ADL's;Develop more efficient breathing  techniques such as purse lipped breathing and diaphragmatic breathing and practicing self-pacing with activity. Increase Strength and Stamina;Improve shortness of breath with ADL's;Develop more efficient breathing techniques such as purse lipped breathing and diaphragmatic breathing and practicing self-pacing with activity. Increase Strength and Stamina;Improve shortness of breath with ADL's;Develop more efficient breathing techniques such as purse lipped breathing and diaphragmatic breathing and practicing self-pacing with activity.    Review see "comments" section on ITP see "comments" section on ITP see "comments" section on ITP see "comments" section on ITP    Expected Outcomes see "Admission" expected outcomes see "Admission" expected outcomes see "Admission" expected outcomes see "Admission" expected outcomes       Core Components/Risk Factors/Patient Goals at Discharge (Final Review):      Goals and Risk Factor Review - 02/01/16 0838      Core Components/Risk Factors/Patient Goals Review   Personal Goals Review Increase Strength and Stamina;Improve shortness of breath with ADL's;Develop more efficient breathing techniques such as purse lipped breathing and diaphragmatic breathing and practicing self-pacing with activity.   Review see "comments" section on ITP   Expected Outcomes see "Admission" expected outcomes      ITP Comments:   Comments: ITP REVIEW Pt is making expected progress toward pulmonary rehab goals after completing 20 sessions. He will be graduating next week and plans to enroll in the pulmonary rehab maintenance program in order to continue his exercise. Recommend continued exercise, life style modification, education, and utilization of breathing techniques to increase stamina and strength and decrease shortness of breath with exertion.

## 2016-02-03 NOTE — Progress Notes (Signed)
Daily Session Note  Patient Details  Name: Bradley Bennett MRN: [id number] Date of Birth: 01-01-1933 Referring Provider:   [name] Pulmonary Rehab Walk Test from 11/09/2015 in [place]  Referring Provider  Dr. Alva Garnet      Encounter Date: 02/03/2016  Check In:     Session Check In - 02/03/16 1030      Check-In   Location [facility]   Staff Present Trish Fountain, RN, BSN;Ramon Dredge, RN, MHA;Joan Leonia Reeves, RN, Roque Cash, RN   Supervising physician immediately available to respond to emergencies [facility] immediately available   Physician(s) Dr. Wynetta Emery   Medication changes reported     No   Fall or balance concerns reported    No   Warm-up and Cool-down Performed as group-led instruction   Resistance Training Performed Yes   VAD Patient? No     Pain Assessment   Currently in Pain? No/denies   Multiple Pain Sites No      Capillary Blood Glucose: No results found for this or any previous visit (from the past 24 hour(s)).      Exercise Prescription Changes - 02/03/16 1200      Exercise Review   Progression Yes     Response to Exercise   Blood Pressure (Admit) 116/58   Blood Pressure (Exercise) 124/70   Blood Pressure (Exit) 118/64   Heart Rate (Admit) 93 bpm   Heart Rate (Exercise) 90 bpm   Heart Rate (Exit) 86 bpm   Oxygen Saturation (Admit) 98 %   Oxygen Saturation (Exercise) 95 %   Oxygen Saturation (Exit) 100 %   Rating of Perceived Exertion (Exercise) 11   Perceived Dyspnea (Exercise) 1   Duration Progress to 45 minutes of aerobic exercise without signs/symptoms of physical distress   Intensity THRR unchanged     Progression   Progression Continue to progress workloads to maintain intensity without signs/symptoms of physical distress.     Resistance Training   Training Prescription Yes   Weight green bands   Reps 10-12  10 minutes of strength training     Interval Training    Interval Training No     Oxygen   Oxygen Continuous   Liters 8-10     NuStep   Level 6   Minutes 17   METs 1.6     Arm Ergometer   Level 4   Minutes 17     Goals Met:  Exercise tolerated well No report of cardiac concerns or symptoms Strength training completed today  Goals Unmet:  Not Applicable  Comments: Service time is from 1030 to 1215    Dr. Rush Farmer is Medical Director for Pulmonary Rehab at [facility].

## 2016-02-04 ENCOUNTER — Other Ambulatory Visit: Payer: Medicare Other | Admitting: *Deleted

## 2016-02-04 ENCOUNTER — Ambulatory Visit (HOSPITAL_COMMUNITY): Payer: Medicare Other | Attending: Cardiovascular Disease

## 2016-02-04 DIAGNOSIS — E7800 Pure hypercholesterolemia, unspecified: Secondary | ICD-10-CM | POA: Diagnosis not present

## 2016-02-04 DIAGNOSIS — J449 Chronic obstructive pulmonary disease, unspecified: Secondary | ICD-10-CM | POA: Insufficient documentation

## 2016-02-04 DIAGNOSIS — I10 Essential (primary) hypertension: Secondary | ICD-10-CM | POA: Diagnosis not present

## 2016-02-04 DIAGNOSIS — I2510 Atherosclerotic heart disease of native coronary artery without angina pectoris: Secondary | ICD-10-CM | POA: Diagnosis not present

## 2016-02-04 DIAGNOSIS — R9439 Abnormal result of other cardiovascular function study: Secondary | ICD-10-CM | POA: Insufficient documentation

## 2016-02-04 DIAGNOSIS — R0602 Shortness of breath: Secondary | ICD-10-CM | POA: Insufficient documentation

## 2016-02-04 LAB — HEPATIC FUNCTION PANEL
ALBUMIN: 3.8 g/dL (ref 3.6–5.1)
ALT: 5 U/L — ABNORMAL LOW (ref 9–46)
AST: 18 U/L (ref 10–35)
Alkaline Phosphatase: 90 U/L (ref 40–115)
BILIRUBIN DIRECT: 0.1 mg/dL (ref <=0.2)
BILIRUBIN TOTAL: 0.6 mg/dL (ref 0.2–1.2)
Indirect Bilirubin: 0.5 mg/dL (ref 0.2–1.2)
Total Protein: 6.5 g/dL (ref 6.1–8.1)

## 2016-02-04 LAB — LIPID PANEL
CHOL/HDL RATIO: 3 ratio (ref <5.0)
CHOLESTEROL: 142 mg/dL (ref <200)
HDL: 47 mg/dL (ref >40)
LDL Cholesterol: 75 mg/dL (ref <100)
TRIGLYCERIDES: 101 mg/dL (ref <150)
VLDL: 20 mg/dL (ref <30)

## 2016-02-04 LAB — MYOCARDIAL PERFUSION IMAGING
CHL CUP NUCLEAR SDS: 1
CHL CUP NUCLEAR SRS: 9
CHL CUP RESTING HR STRESS: 77 {beats}/min
CSEPPHR: 96 {beats}/min
LHR: 0.52
LV dias vol: 110 mL (ref 62–150)
LVSYSVOL: 41 mL
NUC STRESS TID: 1.12
SSS: 10

## 2016-02-04 MED ORDER — TECHNETIUM TC 99M TETROFOSMIN IV KIT
32.8000 | PACK | Freq: Once | INTRAVENOUS | Status: AC | PRN
  Administered 2016-02-04: 32.8 via INTRAVENOUS
  Filled 2016-02-04: qty 33

## 2016-02-04 MED ORDER — TECHNETIUM TC 99M TETROFOSMIN IV KIT
10.2000 | PACK | Freq: Once | INTRAVENOUS | Status: AC | PRN
  Administered 2016-02-04: 10.2 via INTRAVENOUS
  Filled 2016-02-04: qty 11

## 2016-02-04 MED ORDER — REGADENOSON 0.4 MG/5ML IV SOLN
0.4000 mg | Freq: Once | INTRAVENOUS | Status: AC
  Administered 2016-02-04: 0.4 mg via INTRAVENOUS

## 2016-02-08 ENCOUNTER — Telehealth: Payer: Self-pay | Admitting: Pulmonary Disease

## 2016-02-08 ENCOUNTER — Encounter (HOSPITAL_COMMUNITY)
Admission: RE | Admit: 2016-02-08 | Discharge: 2016-02-08 | Disposition: A | Discharge status: Home / Self Care | Payer: Medicare Other | Source: Ambulatory Visit | Attending: Cardiology | Admitting: Cardiology

## 2016-02-08 ENCOUNTER — Telehealth: Payer: Self-pay

## 2016-02-08 VITALS — Wt 189.2 lb

## 2016-02-08 DIAGNOSIS — J8410 Pulmonary fibrosis, unspecified: Secondary | ICD-10-CM

## 2016-02-08 DIAGNOSIS — E785 Hyperlipidemia, unspecified: Secondary | ICD-10-CM

## 2016-02-08 MED ORDER — ATORVASTATIN CALCIUM 20 MG PO TABS: 20.0000 mg | ORAL_TABLET | Freq: Every day | ORAL | 3 refills | Status: DC

## 2016-02-08 NOTE — Telephone Encounter (Signed)
She can be reached @ [phone] her name is Bradley Bennett.Bradley Bennett

## 2016-02-08 NOTE — Telephone Encounter (Signed)
-----   Message from Sueanne Margarita, MD sent at 02/06/2016 12:34 PM EST ----- LDL mildly elevated - increase Lipitor to 20mg  daily and repeat FLP and ALT in 6 weeks

## 2016-02-08 NOTE — Telephone Encounter (Signed)
lmtcb X1 for Bradley Bennett at [employer]

## 2016-02-08 NOTE — Progress Notes (Signed)
Daily Session Note  Patient Details  Name: Bradley Bennett MRN: [id number] Date of Birth: 20-Sep-1932 Referring Provider:   [name] Pulmonary Rehab Walk Test from 11/09/2015 in [place]  Referring Provider  Dr. Alva Garnet      Encounter Date: 02/08/2016  Check In:     Session Check In - 02/08/16 1016      Check-In   Location [facility]   Staff Present Rosebud Poles, RN, BSN;Molly diVincenzo, MS, ACSM RCEP, Exercise Physiologist;Lisa Ysidro Evert, RN   Supervising physician immediately available to respond to emergencies [facility] immediately available   Physician(s) Dr. Quincy Simmonds   Medication changes reported     No   Fall or balance concerns reported    No   Warm-up and Cool-down Performed as group-led instruction   Resistance Training Performed Yes   VAD Patient? No     Pain Assessment   Currently in Pain? No/denies   Multiple Pain Sites No      Capillary Blood Glucose: No results found for this or any previous visit (from the past 24 hour(s)).      Exercise Prescription Changes - 02/08/16 1200      Exercise Review   Progression Yes     Response to Exercise   Blood Pressure (Admit) 100/54   Blood Pressure (Exercise) 120/60   Blood Pressure (Exit) 98/60   Heart Rate (Admit) 92 bpm   Heart Rate (Exercise) 107 bpm   Heart Rate (Exit) 90 bpm   Oxygen Saturation (Admit) 98 %   Oxygen Saturation (Exercise) 89 %   Oxygen Saturation (Exit) 84 %  was suppose to be on 10 l when walking, was on 8l.   Rating of Perceived Exertion (Exercise) 11   Perceived Dyspnea (Exercise) 1   Duration Progress to 45 minutes of aerobic exercise without signs/symptoms of physical distress   Intensity THRR unchanged     Progression   Progression Continue to progress workloads to maintain intensity without signs/symptoms of physical distress.     Resistance Training   Training Prescription Yes   Weight green bands   Reps 10-12  10  minutes of strength training     Interval Training   Interval Training No     Oxygen   Oxygen Continuous   Liters 8-10     NuStep   Level 7   Minutes 17   METs 1.8     Arm Ergometer   Level 4   Minutes 17     Track   Laps 7   Minutes 17     Goals Met:  Exercise tolerated well Strength training completed today  Goals Unmet:  Not Applicable  Comments: Service time is from 1030 to 1200    Dr. Rush Farmer is Medical Director for Pulmonary Rehab at [facility].

## 2016-02-08 NOTE — Telephone Encounter (Signed)
Informed patient's DPR of results and verbal understanding expressed.   Instructed Bradley Bennett to INCREASE LIPITOR to 20 mg daily. FLP and ALT scheduled 2/19. Rx called to [facility] per [facility] request. She was grateful for call.

## 2016-02-09 NOTE — Telephone Encounter (Signed)
Called over to [facility] and spoke with Thayer Headings and she called Lattie Haw to make her aware that BQ approved to the order being placed. I have placed the order nothing further is needed at this time.

## 2016-02-09 NOTE — Telephone Encounter (Signed)
OK to place order.

## 2016-02-09 NOTE — Telephone Encounter (Signed)
Spoke with Lattie Haw at [employer], states that is about to graduate from [facility] rehab and would like to continue with the maintenance program.    BQ ok to place maintenance pulm rehab order? Thanks

## 2016-02-10 ENCOUNTER — Encounter (HOSPITAL_COMMUNITY)
Admission: RE | Admit: 2016-02-10 | Discharge: 2016-02-10 | Disposition: A | Discharge status: Home / Self Care | Payer: Medicare Other | Source: Ambulatory Visit | Attending: Pulmonary Disease | Admitting: Pulmonary Disease

## 2016-02-10 VITALS — Wt 189.4 lb

## 2016-02-10 DIAGNOSIS — J8410 Pulmonary fibrosis, unspecified: Secondary | ICD-10-CM | POA: Diagnosis not present

## 2016-02-10 NOTE — Progress Notes (Signed)
Daily Session Note  Patient Details  Name: Bradley Bennett MRN: [id number] Date of Birth: March 18, 1932 Referring Provider:   [name] Pulmonary Rehab Walk Test from 11/09/2015 in [place]  Referring Provider  Dr. Alva Garnet      Encounter Date: 02/10/2016  Check In:     Session Check In - 02/10/16 1022      Check-In   Location [facility]   Staff Present Rosebud Poles, RN, BSN;Molly diVincenzo, MS, ACSM RCEP, Exercise Physiologist;Portia Rollene Rotunda, RN, Roque Cash, RN   Supervising physician immediately available to respond to emergencies [facility] immediately available   Physician(s) Dr. Tana Coast   Medication changes reported     No   Fall or balance concerns reported    No   Warm-up and Cool-down Performed as group-led instruction   Resistance Training Performed Yes   VAD Patient? No     Pain Assessment   Currently in Pain? No/denies   Multiple Pain Sites No      Capillary Blood Glucose: No results found for this or any previous visit (from the past 24 hour(s)).      Exercise Prescription Changes - 02/10/16 1200      Response to Exercise   Blood Pressure (Admit) 122/66   Blood Pressure (Exercise) 150/70   Blood Pressure (Exit) 128/80   Heart Rate (Admit) 80 bpm   Heart Rate (Exercise) 92 bpm   Heart Rate (Exit) 79 bpm   Oxygen Saturation (Admit) 96 %   Oxygen Saturation (Exercise) 90 %   Oxygen Saturation (Exit) 97 %   Rating of Perceived Exertion (Exercise) 13   Perceived Dyspnea (Exercise) 1   Duration Progress to 45 minutes of aerobic exercise without signs/symptoms of physical distress   Intensity THRR unchanged     Progression   Progression Continue to progress workloads to maintain intensity without signs/symptoms of physical distress.     Resistance Training   Training Prescription Yes   Weight green bands   Reps 10-12  10 minutes of strength training     Interval Training   Interval Training  No     Oxygen   Oxygen Continuous   Liters 8-10     NuStep   Level 7   Minutes 17   METs 2.2     Arm Ergometer   Level 4   Minutes 17     Goals Met:  Exercise tolerated well No report of cardiac concerns or symptoms Strength training completed today  Goals Unmet:  Not Applicable  Comments: Service time is from 1030 to 1230    Dr. Rush Farmer is Medical Director for Pulmonary Rehab at [facility].

## 2016-02-11 ENCOUNTER — Other Ambulatory Visit: Payer: Self-pay | Admitting: Emergency Medicine

## 2016-02-11 MED ORDER — FINASTERIDE 5 MG PO TABS: 5.0000 mg | ORAL_TABLET | Freq: Every day | ORAL | 1 refills | Status: DC

## 2016-02-15 ENCOUNTER — Encounter (HOSPITAL_COMMUNITY)
Admission: RE | Admit: 2016-02-15 | Discharge: 2016-02-15 | Disposition: A | Discharge status: Home / Self Care | Payer: Medicare Other | Source: Ambulatory Visit | Attending: Pulmonary Disease | Admitting: Pulmonary Disease

## 2016-02-15 DIAGNOSIS — J8410 Pulmonary fibrosis, unspecified: Secondary | ICD-10-CM | POA: Diagnosis not present

## 2016-02-17 ENCOUNTER — Encounter (HOSPITAL_COMMUNITY): Payer: Medicare Other

## 2016-02-18 DIAGNOSIS — R350 Frequency of micturition: Secondary | ICD-10-CM | POA: Diagnosis not present

## 2016-02-18 DIAGNOSIS — N3281 Overactive bladder: Secondary | ICD-10-CM | POA: Diagnosis not present

## 2016-02-18 DIAGNOSIS — N401 Enlarged prostate with lower urinary tract symptoms: Secondary | ICD-10-CM | POA: Diagnosis not present

## 2016-02-22 ENCOUNTER — Encounter (HOSPITAL_COMMUNITY): Payer: Medicare Other

## 2016-02-22 ENCOUNTER — Encounter (HOSPITAL_COMMUNITY)
Admission: RE | Admit: 2016-02-22 | Discharge: 2016-02-22 | Disposition: A | Discharge status: Home / Self Care | Payer: Medicare Other | Source: Ambulatory Visit | Attending: Pulmonary Disease | Admitting: Pulmonary Disease

## 2016-02-22 NOTE — Progress Notes (Signed)
Bradley Bennett started the pulmonary rehab maintenance program today.  He exercised for 45 minutes and tolerated it well.

## 2016-02-24 ENCOUNTER — Encounter (HOSPITAL_COMMUNITY): Payer: Medicare Other

## 2016-02-24 ENCOUNTER — Encounter (HOSPITAL_COMMUNITY): Payer: Self-pay

## 2016-02-24 NOTE — Progress Notes (Signed)
Discharge Summary  Patient Details  Name: Bradley Bennett MRN: [id number] Date of Birth: 1932/02/21 Referring Provider:   [name] Pulmonary Rehab Walk Test from 11/09/2015 in [place]  Referring Provider  Dr. Alva Garnet       Number of Visits: 23  Reason for Discharge:  Patient reached a stable level of exercise. Patient independent in their exercise.  Smoking History:  History  Smoking Status  . Former Smoker  . Packs/day: 2.00  . Years: 35.00  . Quit date: 08/15/1985  Smokeless Tobacco  . Never Used    Comment: quit smoking in 1992    Diagnosis:  No diagnosis found.  ADL UCSD:     Pulmonary Assessment Scores    Row Name 11/16/15 1617 02/09/16 1449       ADL UCSD   ADL Phase Entry Exit    SOB Score total 86 79       Initial Exercise Prescription:     Initial Exercise Prescription - 11/11/15 0600      Date of Initial Exercise RX and Referring Provider   Date 11/11/15   Referring Provider Dr. Alva Garnet     Oxygen   Oxygen Continuous   Liters 6-8     NuStep   Level 2   Minutes 17   METs 1.5     Arm Ergometer   Level 2   Minutes 17     Track   Laps 4   Minutes 17     Prescription Details   Frequency (times per week) 2   Duration Progress to 45 minutes of aerobic exercise without signs/symptoms of physical distress     Intensity   THRR 40-80% of Max Heartrate 56-111   Ratings of Perceived Exertion 11-13   Perceived Dyspnea 0-4     Progression   Progression Continue progressive overload as per policy without signs/symptoms or physical distress.     Resistance Training   Training Prescription Yes   Weight GREEN BANDS   Reps 10-12      Discharge Exercise Prescription (Final Exercise Prescription Changes):     Exercise Prescription Changes - 02/10/16 1200      Response to Exercise   Blood Pressure (Admit) 122/66   Blood Pressure (Exercise) 150/70   Blood Pressure (Exit) 128/80   Heart Rate  (Admit) 80 bpm   Heart Rate (Exercise) 92 bpm   Heart Rate (Exit) 79 bpm   Oxygen Saturation (Admit) 96 %   Oxygen Saturation (Exercise) 90 %   Oxygen Saturation (Exit) 97 %   Rating of Perceived Exertion (Exercise) 13   Perceived Dyspnea (Exercise) 1   Duration Progress to 45 minutes of aerobic exercise without signs/symptoms of physical distress   Intensity THRR unchanged     Progression   Progression Continue to progress workloads to maintain intensity without signs/symptoms of physical distress.     Resistance Training   Training Prescription Yes   Weight green bands   Reps 10-12  10 minutes of strength training     Interval Training   Interval Training No     Oxygen   Oxygen Continuous   Liters 8-10     NuStep   Level 7   Minutes 17   METs 2.2     Arm Ergometer   Level 4   Minutes 17      Functional Capacity:     6 Minute Walk    Row Name 11/11/15 [phone] 02/15/16 1613  6 Minute Walk   Phase Initial Discharge    Distance 825 feet 1100 feet    Walk Time 4.55 minutes  4 minutes and 55 seconds -  5 minutes 45 seconds    # of Rest Breaks 2  first rest break: 45 seconds Second Break: 20 seconds 1  rest break inititated by EP due to desats    MPH 1.56 2.08    METS 2.23 2.53    RPE 11 12    Perceived Dyspnea  1 2    Symptoms No No    Resting HR 101 bpm 96 bpm    Resting BP 100/70 96/44    Max Ex. HR 122 bpm 127 bpm    Max Ex. BP 102/74 118/60      Interval HR   Baseline HR 101 96    1 Minute HR 102 106    2 Minute HR 103 106    3 Minute HR 115 108    4 Minute HR 107 127    5 Minute HR 122 127    6 Minute HR 120 119    2 Minute Post HR 113 114    Interval Heart Rate? Yes Yes      Interval Oxygen   Interval Oxygen? Yes Yes    Baseline Oxygen Saturation % 93 % 96 %    Baseline Liters of Oxygen 4 L 10 L    1 Minute Oxygen Saturation % 88 % 91 %    1 Minute Liters of Oxygen 4 L 10 L    2 Minute Oxygen Saturation % 81 % 85 %    2 Minute  Liters of Oxygen 4 L 10 L    3 Minute Oxygen Saturation % 87 % 77 %    3 Minute Liters of Oxygen 8 L 15 L    4 Minute Oxygen Saturation % 81 % 78 %    4 Minute Liters of Oxygen 8 L 15 L    5 Minute Oxygen Saturation % 85 % 80 %    5 Minute Liters of Oxygen 8 L 15 L    6 Minute Oxygen Saturation % 91 % 80 %    6 Minute Liters of Oxygen 8 L 15 L    2 Minute Post Oxygen Saturation %  - 90 %    2 Minute Post Liters of Oxygen  - 15 L       Psychological, QOL, Others - Outcomes: PHQ 2/9: Depression screen [facility] 2/9 02/15/2016 11/08/2015  Decreased Interest 0 0  Down, Depressed, Hopeless 0 0  PHQ - 2 Score 0 0    Quality of Life:     Quality of Life - 02/09/16 1450      Quality of Life Scores   Health/Function Post 14.83 %   Socioeconomic Post 15.42 %   Psych/Spiritual Post 16.07 %   Family Post 15.25 %   GLOBAL Post 15.27 %      Personal Goals: Goals established at orientation with interventions provided to work toward goal.     Personal Goals and Risk Factors at Admission - 11/08/15 1102      Core Components/Risk Factors/Patient Goals on Admission   Increase Strength and Stamina Yes   Intervention Provide advice, education, support and counseling about physical activity/exercise needs.;Develop an individualized exercise prescription for aerobic and resistive training based on initial evaluation findings, risk stratification, comorbidities and participant's personal goals.   Expected Outcomes Achievement of increased cardiorespiratory fitness and enhanced  flexibility, muscular endurance and strength shown through measurements of functional capacity and personal statement of participant.   Improve shortness of breath with ADL's Yes   Intervention Provide education, individualized exercise plan and daily activity instruction to help decrease symptoms of SOB with activities of daily living.   Expected Outcomes Short Term: Achieves a reduction of symptoms when performing activities  of daily living.   Develop more efficient breathing techniques such as purse lipped breathing and diaphragmatic breathing; and practicing self-pacing with activity Yes   Intervention Provide education, demonstration and support about specific breathing techniuqes utilized for more efficient breathing. Include techniques such as pursed lipped breathing, diaphragmatic breathing and self-pacing activity.   Expected Outcomes Short Term: Participant will be able to demonstrate and use breathing techniques as needed throughout daily activities.       Personal Goals Discharge:     Goals and Risk Factor Review    Row Name 11/16/15 0655 12/14/15 0659 01/11/16 0719 02/01/16 3474       Core Components/Risk Factors/Patient Goals Review   Personal Goals Review Increase Strength and Stamina;Improve shortness of breath with ADL's;Develop more efficient breathing techniques such as purse lipped breathing and diaphragmatic breathing and practicing self-pacing with activity. Increase Strength and Stamina;Improve shortness of breath with ADL's;Develop more efficient breathing techniques such as purse lipped breathing and diaphragmatic breathing and practicing self-pacing with activity. Increase Strength and Stamina;Improve shortness of breath with ADL's;Develop more efficient breathing techniques such as purse lipped breathing and diaphragmatic breathing and practicing self-pacing with activity. Increase Strength and Stamina;Improve shortness of breath with ADL's;Develop more efficient breathing techniques such as purse lipped breathing and diaphragmatic breathing and practicing self-pacing with activity.    Review see "comments" section on ITP see "comments" section on ITP see "comments" section on ITP see "comments" section on ITP    Expected Outcomes see "Admission" expected outcomes see "Admission" expected outcomes see "Admission" expected outcomes see "Admission" expected outcomes       Nutrition & Weight -  Outcomes:     Pre Biometrics - 11/08/15 1102      Pre Biometrics   Grip Strength 30 kg       Nutrition:   Nutrition Discharge:     Nutrition Assessments - 02/18/16 0955      Rate Your Plate Scores   Post Score 51      Education Questionnaire Score:     Knowledge Questionnaire Score - 02/09/16 1449      Knowledge Questionnaire Score   Post Score 12/13     Bradley Bennett has been discharged from pulmonary rehab after successful completion of the program. Bradley Bennett met his personal and program goals while in the program. He increased the distance he was able to walk in 6 min however his O2 requirements increased to 15 liters due to the progression of his disease. He feels he has made significant improvements during his rehab. He plans to continue his exercise by enrolling in the pulmonary rehab maintenance program which meets every Tuesday and Thursday.

## 2016-02-29 ENCOUNTER — Encounter (HOSPITAL_COMMUNITY)
Admission: RE | Admit: 2016-02-29 | Discharge: 2016-02-29 | Disposition: A | Discharge status: Home / Self Care | Payer: Medicare Other | Source: Ambulatory Visit | Attending: Pulmonary Disease | Admitting: Pulmonary Disease

## 2016-03-02 ENCOUNTER — Encounter (HOSPITAL_COMMUNITY)
Admission: RE | Admit: 2016-03-02 | Discharge: 2016-03-02 | Disposition: A | Discharge status: Home / Self Care | Payer: Medicare Other | Source: Ambulatory Visit | Attending: Pulmonary Disease | Admitting: Pulmonary Disease

## 2016-03-06 ENCOUNTER — Ambulatory Visit: Payer: Medicare Other | Admitting: Pulmonary Disease

## 2016-03-07 ENCOUNTER — Encounter (HOSPITAL_COMMUNITY)
Admission: RE | Admit: 2016-03-07 | Discharge: 2016-03-07 | Disposition: A | Discharge status: Home / Self Care | Payer: Medicare Other | Source: Ambulatory Visit | Attending: Pulmonary Disease | Admitting: Pulmonary Disease

## 2016-03-07 ENCOUNTER — Telehealth: Payer: Self-pay | Admitting: Pulmonary Disease

## 2016-03-07 NOTE — Telephone Encounter (Signed)
Not surprising, need to arrange an ambulatory O2 titration.  Likely needs an O2 pendant.

## 2016-03-07 NOTE — Telephone Encounter (Signed)
BQ  Lisa from Pulmonary Rehab called in and wanted to let you know she did place a note in epic as well, but she wanted to let you know that the pt. Stats was dropping in the 80s on 10 Liters. They were having trouble keeping him up while he was walking

## 2016-03-07 NOTE — Telephone Encounter (Signed)
BQ  Spoke with daughter she stated he already has a o2 pendant

## 2016-03-07 NOTE — Progress Notes (Signed)
Pulmonary Maintenance Rehab Program Bradley Bennett is having more SOB with exercising. His O2 saturations on 10 liters walking are dropping to as low as 80% average is 84. We placed a pulse ox on him for continuous monitoring. We explained to him to take rest breaks when his sat dropped to 88%. He had to take rest stop with each lap. We left him on 10 liters for seated exercises sats were 92-95%.

## 2016-03-08 NOTE — Telephone Encounter (Signed)
We need to find out who his oxygen supplier is and see if they have other options for treatment outside pulmonary rehab.  I recommend that he continue to exercise, he just will need very high flow oxygen when he does.

## 2016-03-08 NOTE — Telephone Encounter (Signed)
lmtcb x1 for pt to verify his DME.

## 2016-03-09 ENCOUNTER — Encounter (HOSPITAL_COMMUNITY)
Admission: RE | Admit: 2016-03-09 | Discharge: 2016-03-09 | Disposition: A | Discharge status: Home / Self Care | Payer: Self-pay | Source: Ambulatory Visit | Attending: Pulmonary Disease | Admitting: Pulmonary Disease

## 2016-03-09 DIAGNOSIS — J8410 Pulmonary fibrosis, unspecified: Secondary | ICD-10-CM | POA: Insufficient documentation

## 2016-03-10 ENCOUNTER — Other Ambulatory Visit: Payer: Medicare Other

## 2016-03-10 ENCOUNTER — Encounter: Payer: Self-pay | Admitting: Pulmonary Disease

## 2016-03-10 ENCOUNTER — Ambulatory Visit (INDEPENDENT_AMBULATORY_CARE_PROVIDER_SITE_OTHER)
Admission: RE | Admit: 2016-03-10 | Discharge: 2016-03-10 | Disposition: A | Discharge status: Home / Self Care | Payer: Medicare Other | Source: Ambulatory Visit | Attending: Pulmonary Disease | Admitting: Pulmonary Disease

## 2016-03-10 ENCOUNTER — Ambulatory Visit (INDEPENDENT_AMBULATORY_CARE_PROVIDER_SITE_OTHER): Payer: Medicare Other | Admitting: Pulmonary Disease

## 2016-03-10 VITALS — BP 134/74 | HR 113 | Ht 70.5 in | Wt 189.0 lb

## 2016-03-10 DIAGNOSIS — J8410 Pulmonary fibrosis, unspecified: Secondary | ICD-10-CM | POA: Diagnosis not present

## 2016-03-10 DIAGNOSIS — E785 Hyperlipidemia, unspecified: Secondary | ICD-10-CM

## 2016-03-10 DIAGNOSIS — R0602 Shortness of breath: Secondary | ICD-10-CM | POA: Diagnosis not present

## 2016-03-10 DIAGNOSIS — R0902 Hypoxemia: Secondary | ICD-10-CM | POA: Diagnosis not present

## 2016-03-10 NOTE — Patient Instructions (Signed)
We will call you with the results of the chest x-ray We will call you with the results of the bloodwork We will recheck out to your oxygen supplier to help you get more oxygen at home. Please let us know if they are not helpful by next week. We will see you back in 4 weeks.

## 2016-03-10 NOTE — Assessment & Plan Note (Signed)
He has emphysema but from a symptom standpoint this is not worsened recently and that he's not having increasing cough or chest congestion or wheezing. Primarily his fibrosis is worsening.  Plan: Continue Advair and [place]

## 2016-03-10 NOTE — Assessment & Plan Note (Addendum)
I believe this problem is worsening. It is due to aspiration. However, because of his worsening oxygenation in addition to getting a chest x-ray today on a check a pro BNP to see if there is evidence of him holding onto fluid.  I advised he and his daughter today that his fibrosis is worsening. We may need to consider hospice this year. He voiced understanding.

## 2016-03-10 NOTE — Progress Notes (Signed)
Subjective:    Patient ID: Bradley Bennett, male    DOB: April 03, 1932, 83 y.o.   MRN: [id number]  Synopsis: On her fibrosis and possible COPD with a history of chronic aspiration in the setting of Parkinson's disease. Uses 3-4 L of oxygen continuously.  He was started on oxygen around 2013 after his diagnosis of COPD in 2013.  He was diagnosed with an ILD around 48 in [place]. A modified barium swallow in 2015 showed aspiration.  He smoked 35 years, 2 ppd, quit around 1992.   HPI Chief Complaint  Patient presents with  . Follow-up    pt doing well, denies any breathing complaints today. here to requalify for 02   Lately Bradley Bennett says he is constantly challenged by his dyspnea.  He says that he uses tanks of oxygen at home to help keep up with his oxygen.  He is interested in switching his oxygen supplier because they are not consistently brining enough oxygen for him consistently.  Because of this he is not using his oxygen frequently enough often because he doesn't have them around enough.   He needs about 2 tanks a day in order to get away from his concentrator.   While at rest he feels no dyspnea. He only feels short of breath with exercise or any exercise.  His daughter notes a cough.  He brings up mucus from time to time.     Past Medical History:  Diagnosis Date  . Arthritis   . Asthma   . Coronary artery disease   . Emphysema of lung ([place])   . Hearing difficulty of both ears   . Heart attack   . Heart murmur   . Hyperlipidemia   . Hypertension   . ILD (interstitial lung disease) ([place])   . Parkinson disease ([place])      Family History  Problem Relation Age of Onset  . Colon cancer Father   . Stroke Father   . Arthritis Mother   . Arthritis Sister   . Heart disease Brother   . Kidney cancer Brother      Social History   Social History  . Marital status: Divorced    Spouse name: N/A  . Number of children: N/A  . Years of education: N/A   Occupational History    . retired     [place] History Main Topics  . Smoking status: Former Smoker    Packs/day: 2.00    Years: 35.00    Quit date: 08/15/1985  . Smokeless tobacco: Never Used     Comment: quit smoking in 1992  . Alcohol use 0.0 oz/week     Comment: twice every 6 months  . Drug use: No  . Sexual activity: Not on file   Other Topics Concern  . Not on file   Social History Narrative  . No narrative on file     No Known Allergies   Outpatient Medications Prior to Visit  Medication Sig Dispense Refill  . acetaminophen (TYLENOL) 500 MG tablet Take 500 mg by mouth as needed.    Marland Kitchen atorvastatin (LIPITOR) 20 MG tablet Take 1 tablet (20 mg total) by mouth daily. 90 tablet 3  . busPIRone (BUSPAR) 5 MG tablet TAKE ONE TABLET BY MOUTH ONCE DAILY 90 tablet 1  . carbidopa-levodopa (SINEMET CR) 50-200 MG tablet Take 1 tablet by mouth at bedtime. 90 tablet 1  . carbidopa-levodopa (SINEMET IR) 25-100 MG tablet Take 2 tablets by mouth 3 (three)  times daily. 540 tablet 1  . clopidogrel (PLAVIX) 75 MG tablet Take 75 mg by mouth daily.    . clotrimazole (MYCELEX) 10 MG troche Take 1 tablet (10 mg total) by mouth 5 (five) times daily. 50 tablet 0  . finasteride (PROSCAR) 5 MG tablet Take 1 tablet (5 mg total) by mouth daily. 90 tablet 1  . fluticasone (FLONASE) 50 MCG/ACT nasal spray Place 2 sprays into both nostrils daily. 16 g 6  . Fluticasone-Salmeterol (ADVAIR DISKUS) 500-50 MCG/DOSE AEPB Inhale 1 puff into the lungs 2 (two) times daily.    Marland Kitchen levothyroxine (SYNTHROID, LEVOTHROID) 150 MCG tablet Take 1 tablet (150 mcg total) by mouth daily. 90 tablet 3  . mirabegron ER (MYRBETRIQ) 25 MG TB24 tablet Take 1 tablet (25 mg total) by mouth daily. 90 tablet 1  . Multiple Vitamins-Minerals (CENTRUM VITAMINTS PO) Take 1 tablet by mouth daily.     . tamsulosin (FLOMAX) 0.4 MG CAPS capsule Take 0.4 mg by mouth daily.     . traZODone (DESYREL) 50 MG tablet Take 1.5-2 tablets (75-100 mg total) by mouth at  bedtime. 180 tablet 1  . traZODone (DESYREL) 50 MG tablet TAKE ONE TABLET BY MOUTH AT BEDTIME 90 tablet 1  . umeclidinium bromide (INCRUSE ELLIPTA) 62.5 MCG/INH AEPB Inhale 1 puff into the lungs daily.     No facility-administered medications prior to visit.       Review of Systems  Constitutional: Negative for chills, fatigue and fever.  HENT: Negative for postnasal drip, rhinorrhea, sinus pain and sinus pressure.   Respiratory: Positive for cough and shortness of breath. Negative for wheezing.   Cardiovascular: Negative for chest pain, palpitations and leg swelling.  Gastrointestinal: Negative for abdominal distention, constipation, diarrhea and nausea.  Musculoskeletal: Negative for arthralgias, gait problem, joint swelling and neck stiffness.  Neurological: Negative for seizures, speech difficulty, light-headedness, numbness and headaches.       Objective:   Physical Exam Vitals:   03/10/16 1123  BP: 134/74  Pulse: (!) 113  SpO2: 95%  Weight: 189 lb (85.7 kg)  Height: 5' 10.5" (1.791 m)   RA  Gen: chronically ill appearing HENT: OP clear, TM's clear, neck supple PULM: Crackles bases B, normal percussion CV: RRR, no mgr, trace edema GI: BS+, soft, nontender Derm: no cyanosis or rash Psyche: normal mood and affect   June 2018 CXR reviewed independently again: fibrotic changes bilaterally, emphysema changes  Pulmonary rehab notes form this month reviewed: severe desaturation with exercise     Assessment & Plan:    Current Outpatient Prescriptions:  .  acetaminophen (TYLENOL) 500 MG tablet, Take 500 mg by mouth as needed., Disp: , Rfl:  .  atorvastatin (LIPITOR) 20 MG tablet, Take 1 tablet (20 mg total) by mouth daily., Disp: 90 tablet, Rfl: 3 .  busPIRone (BUSPAR) 5 MG tablet, TAKE ONE TABLET BY MOUTH ONCE DAILY, Disp: 90 tablet, Rfl: 1 .  carbidopa-levodopa (SINEMET CR) 50-200 MG tablet, Take 1 tablet by mouth at bedtime., Disp: 90 tablet, Rfl: 1 .   carbidopa-levodopa (SINEMET IR) 25-100 MG tablet, Take 2 tablets by mouth 3 (three) times daily., Disp: 540 tablet, Rfl: 1 .  clopidogrel (PLAVIX) 75 MG tablet, Take 75 mg by mouth daily., Disp: , Rfl:  .  clotrimazole (MYCELEX) 10 MG troche, Take 1 tablet (10 mg total) by mouth 5 (five) times daily., Disp: 50 tablet, Rfl: 0 .  finasteride (PROSCAR) 5 MG tablet, Take 1 tablet (5 mg total) by mouth daily., Disp:  90 tablet, Rfl: 1 .  fluticasone (FLONASE) 50 MCG/ACT nasal spray, Place 2 sprays into both nostrils daily., Disp: 16 g, Rfl: 6 .  Fluticasone-Salmeterol (ADVAIR DISKUS) 500-50 MCG/DOSE AEPB, Inhale 1 puff into the lungs 2 (two) times daily., Disp: , Rfl:  .  levothyroxine (SYNTHROID, LEVOTHROID) 150 MCG tablet, Take 1 tablet (150 mcg total) by mouth daily., Disp: 90 tablet, Rfl: 3 .  mirabegron ER (MYRBETRIQ) 25 MG TB24 tablet, Take 1 tablet (25 mg total) by mouth daily., Disp: 90 tablet, Rfl: 1 .  Multiple Vitamins-Minerals (CENTRUM VITAMINTS PO), Take 1 tablet by mouth daily. , Disp: , Rfl:  .  tamsulosin (FLOMAX) 0.4 MG CAPS capsule, Take 0.4 mg by mouth daily. , Disp: , Rfl:  .  traZODone (DESYREL) 50 MG tablet, Take 1.5-2 tablets (75-100 mg total) by mouth at bedtime., Disp: 180 tablet, Rfl: 1 .  traZODone (DESYREL) 50 MG tablet, TAKE ONE TABLET BY MOUTH AT BEDTIME, Disp: 90 tablet, Rfl: 1 .  umeclidinium bromide (INCRUSE ELLIPTA) 62.5 MCG/INH AEPB, Inhale 1 puff into the lungs daily., Disp: , Rfl:

## 2016-03-10 NOTE — Assessment & Plan Note (Signed)
This problem is worsening. Because of his very high oxygen needs he is unable to leave the home frequently.  Plan: Needs 8 L/m at rest Needs 10 L with exertion We will ask his oxygen supplier to give him enough tanks to be able to get out a couple times a day Continue home concentrator They will contact the oxygen supplier about a portable concentrator that they could use to go to [place] on a rental basis

## 2016-03-10 NOTE — Telephone Encounter (Signed)
lmomtcb x1 

## 2016-03-11 ENCOUNTER — Encounter: Payer: Self-pay | Admitting: Cardiology

## 2016-03-11 LAB — LIPID PANEL
CHOL/HDL RATIO: 3.2 ratio (ref 0.0–5.0)
Cholesterol, Total: 161 mg/dL (ref 100–199)
HDL: 50 mg/dL (ref >39)
LDL CALC: 84 mg/dL (ref 0–99)
Triglycerides: 135 mg/dL (ref 0–149)
VLDL CHOLESTEROL CAL: 27 mg/dL (ref 5–40)

## 2016-03-11 LAB — ALT: ALT: 10 IU/L (ref 0–44)

## 2016-03-13 NOTE — Telephone Encounter (Signed)
This matter has been handled. Pt had an appointment with BQ on 03/10/16. Message will be closed.

## 2016-03-14 ENCOUNTER — Encounter (HOSPITAL_COMMUNITY): Payer: Self-pay

## 2016-03-14 ENCOUNTER — Telehealth: Payer: Self-pay | Admitting: Pulmonary Disease

## 2016-03-14 ENCOUNTER — Encounter: Payer: Self-pay | Admitting: Pulmonary Disease

## 2016-03-14 DIAGNOSIS — J8410 Pulmonary fibrosis, unspecified: Secondary | ICD-10-CM

## 2016-03-14 NOTE — Telephone Encounter (Signed)
Notes Recorded by Juanito Doom, MD on 03/13/2016 at 8:09 PM EST A, Please let the patient know this didn't show anything new. Thanks, B ---------------------------------- Spoke with pt's daughter, Margarita Grizzle. She is aware of results. Nothing further was needed.

## 2016-03-14 NOTE — Telephone Encounter (Signed)
Spoke with pt's daughter Margarita Grizzle on the phone regarding another topic- BQ please see below email.  Please advise on high liter flow POC that patient is able to rent for a trip.  Pt uses up to 10lpm.  Thanks

## 2016-03-16 ENCOUNTER — Telehealth: Payer: Self-pay | Admitting: Pulmonary Disease

## 2016-03-16 ENCOUNTER — Encounter (HOSPITAL_COMMUNITY)
Admission: RE | Admit: 2016-03-16 | Discharge: 2016-03-16 | Disposition: A | Discharge status: Home / Self Care | Payer: Self-pay | Source: Ambulatory Visit | Attending: Pulmonary Disease | Admitting: Pulmonary Disease

## 2016-03-16 DIAGNOSIS — J8410 Pulmonary fibrosis, unspecified: Secondary | ICD-10-CM

## 2016-03-16 NOTE — Telephone Encounter (Signed)
Can they test him with it and monitor his O2 saturation?

## 2016-03-16 NOTE — Telephone Encounter (Signed)
Spoke with Maudie Mercury at [facility]. States that they do not have a POC that will accommodate 10L. They do have one that will accommodate 9L pulsed.  BQ - do you think this will work for the pt? Thanks.

## 2016-03-17 ENCOUNTER — Other Ambulatory Visit: Payer: Medicare Other

## 2016-03-17 NOTE — Telephone Encounter (Signed)
Called and spoke to [facility], she states they are able to test pt to see if he tolerates the 9lpm flow, pulsed. Will keep message open to follow up on to see if this can be ordered for pt.

## 2016-03-20 NOTE — Telephone Encounter (Signed)
BQ please advise. thanks 

## 2016-03-21 ENCOUNTER — Encounter (HOSPITAL_COMMUNITY): Payer: Self-pay

## 2016-03-21 ENCOUNTER — Ambulatory Visit: Payer: Medicare Other | Admitting: Neurology

## 2016-03-23 ENCOUNTER — Encounter (HOSPITAL_COMMUNITY)
Admission: RE | Admit: 2016-03-23 | Discharge: 2016-03-23 | Disposition: A | Discharge status: Home / Self Care | Payer: Self-pay | Source: Ambulatory Visit | Attending: Pulmonary Disease | Admitting: Pulmonary Disease

## 2016-03-23 NOTE — Telephone Encounter (Signed)
Yes please order this.  

## 2016-03-23 NOTE — Telephone Encounter (Signed)
Order has been placed for the 9L pulsed POC. Nothing further was needed.

## 2016-03-23 NOTE — Progress Notes (Signed)
Bradley Bennett was seen today in the movement disorders clinic for neurologic consultation at the request of [name], MD.  The consultation is for the evaluation of PD.  This patient is accompanied in the office by his child who supplements the history.  Pt just moved here from [place].  I don't have any previous neurology records.  Pt reports that he was dx with PD about 4 years ago per pt and about 10 years ago per daughter.  His first sx was L hand tremor.  Daughter states he was on something else prior to the levodopa but she isn't sure that it was specifically for PD.  He is currently on carbidopa/levodopa 25/100, 1.5 tablets tid (8:30am/5:30/9pm).  Daughter thinks that the med wears off   11/16/15 update: The patient follows up today, accompanied by his daughter who supplements the history.  Last visit, we talked about changing his dosing of his medication, so that he takes carbidopa/levodopa 25/100, 2 tablets at 8 AM/noon/4 PM and then we added carbidopa/levodopa 50/200 at bedtime.  Daughter thinks that it helps.  He had an MRI of the brain and cervical spine since our last visit.  The MRI of the brain demonstrated mild to moderate white matter disease.  The MRI of the cervical spine demonstrated degenerative changes and spinal stenosis, but nothing surgical noted.  He has not had any falls since last visit.  No hallucinations.  No lightheadedness or near syncope.  He is using melatonin, 10 mg, for sleep and trazodone for sleep.  Doing pulm rehab on tues/thurs but daughter states that more active on the other days.  He is doing Wii bowling.  No hallucinations.  He lives in independent assisted living but he does his own medication.  His daughter prepares the pill box and he has no trouble remembering to take the medication.    03/24/16 update:  Patient follows up today, accompanied by his daughter supplements the history.  He is on carbidopa/levodopa 25/100, 2 tablet at 8 AM/noon/4 PM and  carbidopa/levodopa 50/200 at bedtime.  Noting some tremor in the leg.  Daughter fills his pill box and noting a lot of extra pills at the end of the week.  Patient admits that he forgot the middle of the day dose today (supposed to take at noon and it is 3pm).  He has had no falls.  No hallucinations.  No lightheadedness or near syncope.  Not able to exercise because of pulmonary constraints.  In pulmonary rehab and reviewed those records.  He remains in independent assisted living. Daughter quietly mentions to me that Dr. Lake Bells mentioned hospice.  Noting paresthesias of the hands and feet.    PREVIOUS MEDICATIONS: Sinemet  ALLERGIES:   No Known Allergies  CURRENT MEDICATIONS:  Outpatient Encounter Prescriptions as of 03/24/2016  Medication Sig  . acetaminophen (TYLENOL) 500 MG tablet Take 500 mg by mouth as needed.  Marland Kitchen atorvastatin (LIPITOR) 20 MG tablet Take 1 tablet (20 mg total) by mouth daily.  . busPIRone (BUSPAR) 5 MG tablet TAKE ONE TABLET BY MOUTH ONCE DAILY  . carbidopa-levodopa (SINEMET CR) 50-200 MG tablet Take 1 tablet by mouth at bedtime.  . carbidopa-levodopa (SINEMET IR) 25-100 MG tablet Take 2 tablets by mouth 3 (three) times daily.  . clopidogrel (PLAVIX) 75 MG tablet Take 75 mg by mouth daily.  . clotrimazole (MYCELEX) 10 MG troche Take 1 tablet (10 mg total) by mouth 5 (five) times daily.  . finasteride (PROSCAR) 5  MG tablet Take 1 tablet (5 mg total) by mouth daily.  . fluticasone (FLONASE) 50 MCG/ACT nasal spray Place 2 sprays into both nostrils daily.  . Fluticasone-Salmeterol (ADVAIR DISKUS) 500-50 MCG/DOSE AEPB Inhale 1 puff into the lungs 2 (two) times daily.  Marland Kitchen levothyroxine (SYNTHROID, LEVOTHROID) 150 MCG tablet Take 1 tablet (150 mcg total) by mouth daily.  . mirabegron ER (MYRBETRIQ) 25 MG TB24 tablet Take 1 tablet (25 mg total) by mouth daily.  . Multiple Vitamins-Minerals (CENTRUM VITAMINTS PO) Take 1 tablet by mouth daily.   . tamsulosin (FLOMAX) 0.4 MG CAPS  capsule Take 0.4 mg by mouth daily.   . traZODone (DESYREL) 50 MG tablet Take 1.5-2 tablets (75-100 mg total) by mouth at bedtime.  . traZODone (DESYREL) 50 MG tablet TAKE ONE TABLET BY MOUTH AT BEDTIME  . umeclidinium bromide (INCRUSE ELLIPTA) 62.5 MCG/INH AEPB Inhale 1 puff into the lungs daily.   No facility-administered encounter medications on file as of 03/24/2016.     PAST MEDICAL HISTORY:   Past Medical History:  Diagnosis Date  . Arthritis   . Asthma   . Coronary artery disease   . Emphysema of lung ([place])   . Hearing difficulty of both ears   . Heart attack   . Heart murmur   . Hyperlipidemia   . Hypertension   . ILD (interstitial lung disease) ([place])   . Parkinson disease ([place])     PAST SURGICAL HISTORY:   Past Surgical History:  Procedure Laterality Date  . CARPAL TUNNEL RELEASE Right 03/2014  . CORONARY ANGIOPLASTY WITH STENT PLACEMENT    . CORONARY ARTERY BYPASS GRAFT    . HERNIA REPAIR  10/2013   x3   . VEIN BYPASS SURGERY      SOCIAL HISTORY:   Social History   Social History  . Marital status: Divorced    Spouse name: N/A  . Number of children: N/A  . Years of education: N/A   Occupational History  . retired     [place] History Main Topics  . Smoking status: Former Smoker    Packs/day: 2.00    Years: 35.00    Quit date: 08/15/1985  . Smokeless tobacco: Never Used     Comment: quit smoking in 1992  . Alcohol use 0.0 oz/week     Comment: twice every 6 months  . Drug use: No  . Sexual activity: Not on file   Other Topics Concern  . Not on file   Social History Narrative  . No narrative on file    FAMILY HISTORY:   Family Status  Relation Status  . Father Deceased   stroke, colon cancer  . Mother Deceased   arthritis, ruptured spleen  . Brother Deceased   MI, DM, kidney cancer  . Sister Alive   lupus  . Sister Alive   heart disease, DM  . Daughter Alive   2, healthy  . Son Alive   1, healthy  . Sister   . Brother      ROS:  A complete 10 system review of systems was obtained and was unremarkable apart from what is mentioned above.  PHYSICAL EXAMINATION:    VITALS:   Vitals:   03/24/16 1441  BP: 110/70  Pulse: 87  SpO2: 92%  Weight: 187 lb 4 oz (84.9 kg)  Height: 5' 10.5" (1.791 m)    GEN:  The patient appears stated age and is in NAD. HEENT:  Normocephalic, atraumatic.  The mucous  membranes are moist. The superficial temporal arteries are without ropiness or tenderness. CV:  RRR Lungs:  CTAB.  He is wearing O2 Neck/HEME:  There are no carotid bruits bilaterally.  Neurological examination:  Orientation:  Montreal Cognitive Assessment  08/16/2015  Visuospatial/ Executive (0/5) 5  Naming (0/3) 3  Attention: Read list of digits (0/2) 2  Attention: Read list of letters (0/1) 1  Attention: Serial 7 subtraction starting at 100 (0/3) 3  Language: Repeat phrase (0/2) 2  Language : Fluency (0/1) 1  Abstraction (0/2) 2  Delayed Recall (0/5) 2  Orientation (0/6) 6  Total 27  Adjusted Score (based on education) 27   Cranial nerves: There is good facial symmetry. Pupils are equal round and reactive to light bilaterally. Fundoscopic exam reveals clear margins bilaterally. Extraocular muscles are intact. The visual fields are full to confrontational testing. The speech is fluent and clear. Soft palate rises symmetrically and there is no tongue deviation. Hearing is decreased to conversational tone. Sensation: Sensation is intact to light Touch throughout. Motor: Strength is 5/5 in the bilateral upper and lower extremities.   Shoulder shrug is equal and symmetric.  There is no pronator drift. Deep tendon reflexes: Deep tendon reflexes are 2+-3/4 at the bilateral biceps, triceps, brachioradialis,   Movement examination: Tone: There is no increased tone in the UE's.     The tone in the lower extremities is normal.  Abnormal movements: There is LUE resting tremor  Coordination:  There is good rapid  alternating movements today. Gait and Station: The patient has minimal difficulty arising out of a deep-seated chair without the use of the hands. The patient's stride length is good with reemergent tremor on the left.  He is independent living with his oxygen.  ASSESSMENT/PLAN:  1.  idiopathic Parkinson's disease.  The patient has tremor, bradykinesia, rigidity and Minimal postural instability.  -We will continue carbidopa/levodopa 25/100, 2 tablets at 8 AM/noon/4 PM.  Biggest issue is getting him to remember to take the medicine.  Had to meet with our social worker today and showed them the alarmed pillbox.  He used a service while in [place] that would alarm and send a text to his phone, but he kept turning off the alarm.  -He is noting some tremor in the leg, and I'm not sure if that is because he is forgetting some of his meds.  Told him if he is taking the medication properly, he can take an extra 1-2 levodopa per day as needed.  -He will continue carbidopa/levodopa 50/200 at bedtime.  -He really cannot exercise because of pulmonary disease, and it appears that hospice is being considered given the state of pulmonary fibrosis.  2.  Hyperreflexia  -MRI of the brain just demonstrated mild to moderate white matter disease and MRI the cervical spine demonstrates degenerative changes, but no cervical surgical lesions.  3.  Dysphagia with aspiration  -Per pulm records, pt does have a hx of dysphagia with chronic aspiration/post inflammatory pulm fibrosis but refuses thickening agents.  His last swallow study per the patient was over a year ago, but if he is unwilling to follow recommendations, there is no use repeating it.  -pt requests DNR status which was added to the chart.  4.   Follow up is anticipated in the next few months, sooner should new neurologic issues arise.  Much greater than 50% of this visit was spent in counseling and coordinating care.  Total face to face time:  25 min

## 2016-03-24 ENCOUNTER — Encounter: Payer: Self-pay | Admitting: Neurology

## 2016-03-24 ENCOUNTER — Ambulatory Visit (INDEPENDENT_AMBULATORY_CARE_PROVIDER_SITE_OTHER): Payer: Medicare Other | Admitting: Neurology

## 2016-03-24 VITALS — BP 110/70 | HR 87 | Ht 70.5 in | Wt 187.2 lb

## 2016-03-24 DIAGNOSIS — G20 Parkinson's disease: Secondary | ICD-10-CM

## 2016-03-24 NOTE — Progress Notes (Signed)
Clinical Social Work Note  CSW met with pt and pt daughter at the request of Dr. Carles Collet to discuss medication management. CSW explored with pt and pt daughter pt current management of medications. Pt daughter discussed that she sorts pt medications weekly, but pt is having trouble remembering to take medication especially in the middle of the day. CSW showed pt and pt daughter alarmed pill box, but pt daughter did not feel that pt could manage handling the box in order to turn the box over to get the medications given the amount of medications that pt takes. Pt daughter discussed that pt used a service when living in [place] that had an alarm, called that patient if he hadn't taken his medication, and sent a text to pt daughter. Pt daughter states that the pt turned off the alarm, so the system was not effective. Pt daughter discussed that challenge with setting an alarm now is that pt cannot hear the alarm. CSW discussed strategies of taking medication along with daily routine. CSW discussed medication reminder watches that display colors for medications and vibrate that may be an effective way to remind pt that won't rely on pt having to hear an alarm. Pt daughter felt that a watch medication reminder would be something worth exploring. CSW provided positive reinforcement for the support pt daughter provides pt. CSW provided CSW contact information and encouraged pt daughter to contact CSW if any further social work needs arise.  Alison Murray, MSW, LCSW Clinical Social Worker [facility] [phone]

## 2016-03-27 ENCOUNTER — Other Ambulatory Visit: Payer: Medicare Other | Admitting: *Deleted

## 2016-03-27 DIAGNOSIS — E7800 Pure hypercholesterolemia, unspecified: Secondary | ICD-10-CM

## 2016-03-27 DIAGNOSIS — I2510 Atherosclerotic heart disease of native coronary artery without angina pectoris: Secondary | ICD-10-CM

## 2016-03-27 LAB — LIPID PANEL
CHOLESTEROL TOTAL: 137 mg/dL (ref 100–199)
Chol/HDL Ratio: 2.7 ratio units (ref 0.0–5.0)
HDL: 50 mg/dL (ref >39)
LDL CALC: 58 mg/dL (ref 0–99)
TRIGLYCERIDES: 144 mg/dL (ref 0–149)
VLDL Cholesterol Cal: 29 mg/dL (ref 5–40)

## 2016-03-27 LAB — ALT: ALT: 6 IU/L (ref 0–44)

## 2016-03-27 NOTE — Addendum Note (Signed)
Addended by: Eulis Foster on: 03/27/2016 11:27 AM   Modules accepted: Orders

## 2016-03-28 ENCOUNTER — Encounter (HOSPITAL_COMMUNITY)
Admission: RE | Admit: 2016-03-28 | Discharge: 2016-03-28 | Disposition: A | Discharge status: Home / Self Care | Payer: Self-pay | Source: Ambulatory Visit | Attending: Pulmonary Disease | Admitting: Pulmonary Disease

## 2016-03-30 ENCOUNTER — Encounter (HOSPITAL_COMMUNITY)
Admission: RE | Admit: 2016-03-30 | Discharge: 2016-03-30 | Disposition: A | Discharge status: Home / Self Care | Payer: Self-pay | Source: Ambulatory Visit | Attending: Pulmonary Disease | Admitting: Pulmonary Disease

## 2016-03-31 ENCOUNTER — Telehealth: Payer: Self-pay | Admitting: Pulmonary Disease

## 2016-03-31 NOTE — Telephone Encounter (Signed)
Called and spoke to [place] with Hospice. She is questioning if BQ would be pt's attending physician with Hospice.   Dr. Lake Bells please advise. Thanks.

## 2016-03-31 NOTE — Telephone Encounter (Signed)
OK by me 

## 2016-03-31 NOTE — Telephone Encounter (Signed)
Attempted to contact hospice but they were closed and unable to leave a vm. Will try back on Monday 04/03/16

## 2016-04-03 NOTE — Telephone Encounter (Signed)
Spoke with Amy with Hospice. She is aware BQ will be pt's attending. Nothing further was needed.

## 2016-04-04 ENCOUNTER — Encounter (HOSPITAL_COMMUNITY)
Admission: RE | Admit: 2016-04-04 | Discharge: 2016-04-04 | Disposition: A | Discharge status: Home / Self Care | Payer: Self-pay | Source: Ambulatory Visit | Attending: Pulmonary Disease | Admitting: Pulmonary Disease

## 2016-04-04 NOTE — Progress Notes (Signed)
We have had to place [place] on 15 liters of oxygen for all exercises seated and walking in the Pulmonary Maintenance program.He has to take frequent rest stops walking every two laps. His saturations drop into the lower 80's with rest return to the 90's.Bradley Bennett is very determined to continue to exercise and he has a very positive attitude.

## 2016-04-06 ENCOUNTER — Encounter (HOSPITAL_COMMUNITY)
Admission: RE | Admit: 2016-04-06 | Discharge: 2016-04-06 | Disposition: A | Discharge status: Home / Self Care | Payer: Self-pay | Source: Ambulatory Visit | Attending: Pulmonary Disease | Admitting: Pulmonary Disease

## 2016-04-06 DIAGNOSIS — J8410 Pulmonary fibrosis, unspecified: Secondary | ICD-10-CM | POA: Insufficient documentation

## 2016-04-10 ENCOUNTER — Encounter: Payer: Self-pay | Admitting: Pulmonary Disease

## 2016-04-10 ENCOUNTER — Ambulatory Visit (INDEPENDENT_AMBULATORY_CARE_PROVIDER_SITE_OTHER): Payer: Medicare Other | Admitting: Pulmonary Disease

## 2016-04-10 VITALS — BP 154/88 | HR 88 | Ht 70.5 in | Wt 187.0 lb

## 2016-04-10 DIAGNOSIS — J449 Chronic obstructive pulmonary disease, unspecified: Secondary | ICD-10-CM | POA: Diagnosis not present

## 2016-04-10 DIAGNOSIS — J8410 Pulmonary fibrosis, unspecified: Secondary | ICD-10-CM | POA: Diagnosis not present

## 2016-04-10 DIAGNOSIS — I2510 Atherosclerotic heart disease of native coronary artery without angina pectoris: Secondary | ICD-10-CM

## 2016-04-10 DIAGNOSIS — R0902 Hypoxemia: Secondary | ICD-10-CM

## 2016-04-10 NOTE — Assessment & Plan Note (Signed)
He has reached severe levels of hypoxemia. With an oxymizer he can rest at 4L [place] continuous, but without it he needs 6L [place] continous. .  10 L with oxymizer on exertion.  In summary he doesn't have any options to use portable concentrators.  I have advised that he use tanks when ambulating.  > 50% of this 27 minute visit spent face to face

## 2016-04-10 NOTE — Patient Instructions (Signed)
While at rest, use 6 L of oxygen continuously for 3 Lpm with your Oxymizer continuously Turn your oxygen flow up to 10 L when exerting yourself Call me if you would like a prescription for morphine We will make a referral to a transitional hospice program We will see back in 6-8 weeks either with me or a nurse practitioner.

## 2016-04-10 NOTE — Progress Notes (Signed)
Subjective:    Patient ID: Bradley Bennett, male    DOB: 01/24/1933, 83 y.o.   MRN: [id number]  Synopsis: On her fibrosis and possible COPD with a history of chronic aspiration in the setting of Parkinson's disease. Uses 3-4 L of oxygen continuously.  He was started on oxygen around 2013 after his diagnosis of COPD in 2013.  He was diagnosed with an ILD around 40 in [place]. A modified barium swallow in 2015 showed aspiration.  He smoked 35 years, 2 ppd, quit around 1992.   HPI Chief Complaint  Patient presents with  . Follow-up    pt denies any breathing complaints today.  pt arrived on 4lpm pulse on POC and sats were at 79%.    Bradley Bennett says that he has been doing pretty good lately.  He wants to put off starting morphine because he is afraid it will make him feel too drowsy.  He had a bad spell with dyspnea yesterday.  He really wants to use his POC which only gets his oxygen level up to a slight degree.     Past Medical History:  Diagnosis Date  . Arthritis   . Asthma   . Coronary artery disease   . Emphysema of lung ([place])   . Hearing difficulty of both ears   . Heart attack   . Heart murmur   . Hyperlipidemia   . Hypertension   . ILD (interstitial lung disease) ([place])   . Parkinson disease ([place])          Review of Systems  Constitutional: Negative for chills, fatigue and fever.  HENT: Negative for postnasal drip, rhinorrhea, sinus pain and sinus pressure.   Respiratory: Positive for cough and shortness of breath. Negative for wheezing.   Cardiovascular: Negative for chest pain, palpitations and leg swelling.  Gastrointestinal: Negative for abdominal distention, constipation, diarrhea and nausea.  Musculoskeletal: Negative for arthralgias, gait problem, joint swelling and neck stiffness.  Neurological: Negative for seizures, speech difficulty, light-headedness, numbness and headaches.       Objective:   Physical Exam Vitals:   04/10/16 1649  BP: (!) 154/88    Pulse: 88  SpO2: 97%  Weight: 187 lb (84.8 kg)  Height: 5' 10.5" (1.791 m)   RA  Gen: chronically ill appearing HENT: OP clear, TM's clear, neck supple PULM: Crackles bases  B, normal percussion CV: RRR, no mgr, trace edema GI: BS+, soft, nontender Derm: no cyanosis or rash Psyche: normal mood and affect         Assessment & Plan:  Postinflammatory pulmonary fibrosis (HCC) He has end stage disease and his hypoxemia and dyspnea have reached critical levels lately.  He is a good candidate for hospice but I agree that he is better off to participate in pulmonary rehab for as this adds to his quality of life.  I offered morphine today and explained at length the nature of its appropriate use for dyspnea.  I explained that it could be used sparingly to help him get around more without dyspnea.  However he is not interested at this time.    He is interested in participating in a palliative medicine plan like a transitional hospice plan.  I think this is very reasonable.  Plan: Transitional hospice referral placed Continue pulmonary rehab   Hypoxemia He has reached severe levels of hypoxemia. With an oxymizer he can rest at 4L [place] continuous, but without it he needs 6L [place] continous. .  10 L with oxymizer on  exertion.  In summary he doesn't have any options to use portable concentrators.  I have advised that he use tanks when ambulating.  > 50% of this 27 minute visit spent face to face    Current Outpatient Prescriptions:  .  acetaminophen (TYLENOL) 500 MG tablet, Take 500 mg by mouth as needed., Disp: , Rfl:  .  atorvastatin (LIPITOR) 20 MG tablet, Take 1 tablet (20 mg total) by mouth daily., Disp: 90 tablet, Rfl: 3 .  busPIRone (BUSPAR) 5 MG tablet, TAKE ONE TABLET BY MOUTH ONCE DAILY, Disp: 90 tablet, Rfl: 1 .  carbidopa-levodopa (SINEMET CR) 50-200 MG tablet, Take 1 tablet by mouth at bedtime., Disp: 90 tablet, Rfl: 1 .  carbidopa-levodopa (SINEMET IR) 25-100 MG tablet,  Take 2 tablets by mouth 3 (three) times daily., Disp: 540 tablet, Rfl: 1 .  clopidogrel (PLAVIX) 75 MG tablet, Take 75 mg by mouth daily., Disp: , Rfl:  .  finasteride (PROSCAR) 5 MG tablet, Take 1 tablet (5 mg total) by mouth daily., Disp: 90 tablet, Rfl: 1 .  fluticasone (FLONASE) 50 MCG/ACT nasal spray, Place 2 sprays into both nostrils daily., Disp: 16 g, Rfl: 6 .  Fluticasone-Salmeterol (ADVAIR DISKUS) 500-50 MCG/DOSE AEPB, Inhale 1 puff into the lungs 2 (two) times daily., Disp: , Rfl:  .  levothyroxine (SYNTHROID, LEVOTHROID) 150 MCG tablet, Take 1 tablet (150 mcg total) by mouth daily., Disp: 90 tablet, Rfl: 3 .  mirabegron ER (MYRBETRIQ) 25 MG TB24 tablet, Take 1 tablet (25 mg total) by mouth daily., Disp: 90 tablet, Rfl: 1 .  Multiple Vitamins-Minerals (CENTRUM VITAMINTS PO), Take 1 tablet by mouth daily. , Disp: , Rfl:  .  tamsulosin (FLOMAX) 0.4 MG CAPS capsule, Take 0.4 mg by mouth daily. , Disp: , Rfl:  .  traZODone (DESYREL) 50 MG tablet, Take 1.5-2 tablets (75-100 mg total) by mouth at bedtime., Disp: 180 tablet, Rfl: 1 .  umeclidinium bromide (INCRUSE ELLIPTA) 62.5 MCG/INH AEPB, Inhale 1 puff into the lungs daily., Disp: , Rfl:

## 2016-04-10 NOTE — Assessment & Plan Note (Signed)
He has end stage disease and his hypoxemia and dyspnea have reached critical levels lately.  He is a good candidate for hospice but I agree that he is better off to participate in pulmonary rehab for as this adds to his quality of life.  I offered morphine today and explained at length the nature of its appropriate use for dyspnea.  I explained that it could be used sparingly to help him get around more without dyspnea.  However he is not interested at this time.    He is interested in participating in a palliative medicine plan like a transitional hospice plan.  I think this is very reasonable.  Plan: Transitional hospice referral placed Continue pulmonary rehab

## 2016-04-11 ENCOUNTER — Encounter (HOSPITAL_COMMUNITY)
Admission: RE | Admit: 2016-04-11 | Discharge: 2016-04-11 | Disposition: A | Discharge status: Home / Self Care | Payer: Self-pay | Source: Ambulatory Visit | Attending: Pulmonary Disease | Admitting: Pulmonary Disease

## 2016-04-11 ENCOUNTER — Telehealth: Payer: Self-pay | Admitting: Pulmonary Disease

## 2016-04-11 DIAGNOSIS — J8410 Pulmonary fibrosis, unspecified: Secondary | ICD-10-CM

## 2016-04-11 NOTE — Telephone Encounter (Signed)
I think it is called [facility] or something like that.  Can we check with Katie? I think she knows the program.

## 2016-04-11 NOTE — Telephone Encounter (Signed)
Bradley Bennett would you mind helping with this? Thanks.

## 2016-04-11 NOTE — Telephone Encounter (Signed)
Spoke with Amy with Hospice, who states order for pt was for transitional hospice. [facility] only offers hospice & palliative care. They do not offer transitional hospice care.  BQ please advise. Thanks.

## 2016-04-12 DIAGNOSIS — D485 Neoplasm of uncertain behavior of skin: Secondary | ICD-10-CM | POA: Diagnosis not present

## 2016-04-12 NOTE — Telephone Encounter (Signed)
Order placed with Katie's assistance.  Will send to [facility] to make aware that if any issues processing this order to get with Katie on 04/13/16 for any questions.

## 2016-04-12 NOTE — Telephone Encounter (Signed)
Please order refer to [facility] and put in comments transitional hospice for [facility]. Thanks.

## 2016-04-13 ENCOUNTER — Encounter (HOSPITAL_COMMUNITY)
Admission: RE | Admit: 2016-04-13 | Discharge: 2016-04-13 | Disposition: A | Discharge status: Home / Self Care | Payer: Self-pay | Source: Ambulatory Visit | Attending: Pulmonary Disease | Admitting: Pulmonary Disease

## 2016-04-13 NOTE — Telephone Encounter (Signed)
This is the message we got back from thn not sure what you want to do next [place],  Thank you for your referral for [employer]. Unfortunately this patient is not eligible for [facility].  Reason: Not a beneficiary currently attributed to one of the [place]. Membership roster used to verify non-eligible status.  Thank you, Alycia Rossetti

## 2016-04-18 ENCOUNTER — Encounter (HOSPITAL_COMMUNITY): Payer: Self-pay

## 2016-04-19 DIAGNOSIS — L821 Other seborrheic keratosis: Secondary | ICD-10-CM | POA: Diagnosis not present

## 2016-04-19 DIAGNOSIS — L570 Actinic keratosis: Secondary | ICD-10-CM | POA: Diagnosis not present

## 2016-04-20 ENCOUNTER — Encounter (HOSPITAL_COMMUNITY)
Admission: RE | Admit: 2016-04-20 | Discharge: 2016-04-20 | Disposition: A | Discharge status: Home / Self Care | Payer: Self-pay | Source: Ambulatory Visit | Attending: Pulmonary Disease | Admitting: Pulmonary Disease

## 2016-04-21 DIAGNOSIS — N3281 Overactive bladder: Secondary | ICD-10-CM | POA: Diagnosis not present

## 2016-04-21 DIAGNOSIS — R531 Weakness: Secondary | ICD-10-CM | POA: Diagnosis not present

## 2016-04-21 DIAGNOSIS — N401 Enlarged prostate with lower urinary tract symptoms: Secondary | ICD-10-CM | POA: Diagnosis not present

## 2016-04-24 ENCOUNTER — Other Ambulatory Visit: Payer: Self-pay | Admitting: Neurology

## 2016-04-24 ENCOUNTER — Other Ambulatory Visit: Payer: Self-pay | Admitting: *Deleted

## 2016-04-24 MED ORDER — TAMSULOSIN HCL 0.4 MG PO CAPS: 0.4000 mg | ORAL_CAPSULE | Freq: Every day | ORAL | 1 refills | Status: DC

## 2016-04-24 MED ORDER — CARBIDOPA-LEVODOPA 25-100 MG PO TABS: 2.0000 | ORAL_TABLET | Freq: Three times a day (TID) | ORAL | 1 refills | Status: DC

## 2016-04-24 MED ORDER — LEVOTHYROXINE SODIUM 150 MCG PO TABS: 150.0000 ug | ORAL_TABLET | Freq: Every day | ORAL | 1 refills | Status: DC

## 2016-04-24 MED ORDER — CLOPIDOGREL BISULFATE 75 MG PO TABS: 75.0000 mg | ORAL_TABLET | Freq: Every day | ORAL | 1 refills | Status: DC

## 2016-04-24 MED ORDER — TRAZODONE HCL 50 MG PO TABS: 75.0000 mg | ORAL_TABLET | Freq: Every day | ORAL | 1 refills | Status: DC

## 2016-04-24 MED ORDER — BUSPIRONE HCL 5 MG PO TABS: 5.0000 mg | ORAL_TABLET | Freq: Every day | ORAL | 1 refills | Status: DC

## 2016-04-24 MED ORDER — CARBIDOPA-LEVODOPA ER 50-200 MG PO TBCR: 1.0000 | EXTENDED_RELEASE_TABLET | Freq: Every day | ORAL | 1 refills | Status: DC

## 2016-04-25 ENCOUNTER — Encounter (HOSPITAL_COMMUNITY)
Admission: RE | Admit: 2016-04-25 | Discharge: 2016-04-25 | Disposition: A | Discharge status: Home / Self Care | Payer: Self-pay | Source: Ambulatory Visit | Attending: Pulmonary Disease | Admitting: Pulmonary Disease

## 2016-04-26 DIAGNOSIS — L570 Actinic keratosis: Secondary | ICD-10-CM | POA: Diagnosis not present

## 2016-04-27 ENCOUNTER — Encounter (HOSPITAL_COMMUNITY)
Admission: RE | Admit: 2016-04-27 | Discharge: 2016-04-27 | Disposition: A | Discharge status: Home / Self Care | Payer: Self-pay | Source: Ambulatory Visit | Attending: Pulmonary Disease | Admitting: Pulmonary Disease

## 2016-05-02 ENCOUNTER — Encounter (HOSPITAL_COMMUNITY): Payer: Self-pay

## 2016-05-02 DIAGNOSIS — R0981 Nasal congestion: Secondary | ICD-10-CM | POA: Diagnosis not present

## 2016-05-02 DIAGNOSIS — J342 Deviated nasal septum: Secondary | ICD-10-CM | POA: Diagnosis not present

## 2016-05-04 ENCOUNTER — Encounter (HOSPITAL_COMMUNITY): Payer: Self-pay

## 2016-05-04 NOTE — Telephone Encounter (Signed)
There has been a gap in the follow up on this message as it originated in [facility] box and has not been checked during LM.   Dr. Lake Bells please advise how you would like to proceed since pt does not qualify for program. Thanks.

## 2016-05-04 NOTE — Telephone Encounter (Signed)
I really don't know what their explanation means.  Does this mean he can't get into a transitional hospice program no matter what? Is there something he can apply for with [facility]?

## 2016-05-04 NOTE — Telephone Encounter (Signed)
Left message for Gwenyth Bouillon through [facility] at [phone] to see what is going on and what else we can do.

## 2016-05-08 ENCOUNTER — Telehealth: Payer: Self-pay | Admitting: Pulmonary Disease

## 2016-05-08 NOTE — Telephone Encounter (Signed)
LMTCB for Bradley Bennett and will hold in triage to f/u on since started in [facility] basket

## 2016-05-08 NOTE — Telephone Encounter (Signed)
See if perhaps an NP visit is available next 2-3 days

## 2016-05-08 NOTE — Telephone Encounter (Signed)
Pt daughter Margarita Grizzle calling to schedule an appt Pt having increased cough, hoarseness and SOB x 5 days. Seen by [facility] 05/02/16 and had his sinuses scraped, since having this done his symptoms seemed to worsen.  Margarita Grizzle states that the patient seems "run down" Please advise Dr Annamaria Boots as Dr Lake Bells is not available. Thanks.

## 2016-05-08 NOTE — Telephone Encounter (Signed)
Per Dewitt Hoes- ok to use TP's 10 am on 4/4  Spoke with Margarita Grizzle and scheduled this appt

## 2016-05-09 ENCOUNTER — Encounter (HOSPITAL_COMMUNITY): Payer: Self-pay

## 2016-05-09 DIAGNOSIS — J8410 Pulmonary fibrosis, unspecified: Secondary | ICD-10-CM | POA: Insufficient documentation

## 2016-05-10 ENCOUNTER — Ambulatory Visit (INDEPENDENT_AMBULATORY_CARE_PROVIDER_SITE_OTHER): Payer: Medicare Other | Admitting: Adult Health

## 2016-05-10 ENCOUNTER — Encounter (HOSPITAL_COMMUNITY): Payer: Self-pay

## 2016-05-10 ENCOUNTER — Inpatient Hospital Stay (HOSPITAL_COMMUNITY)
Admission: AD | Admit: 2016-05-10 | Discharge: 2016-05-12 | DRG: 190 | Disposition: A | Discharge status: Home / Self Care | Payer: Medicare Other | Source: Ambulatory Visit | Attending: Pulmonary Disease | Admitting: Pulmonary Disease

## 2016-05-10 ENCOUNTER — Inpatient Hospital Stay: Admit: 2016-05-10 | Payer: Self-pay | Admitting: Pulmonary Disease

## 2016-05-10 ENCOUNTER — Inpatient Hospital Stay (HOSPITAL_COMMUNITY): Payer: Medicare Other

## 2016-05-10 ENCOUNTER — Encounter: Payer: Self-pay | Admitting: Adult Health

## 2016-05-10 DIAGNOSIS — Z8249 Family history of ischemic heart disease and other diseases of the circulatory system: Secondary | ICD-10-CM

## 2016-05-10 DIAGNOSIS — J9621 Acute and chronic respiratory failure with hypoxia: Secondary | ICD-10-CM | POA: Diagnosis not present

## 2016-05-10 DIAGNOSIS — Z955 Presence of coronary angioplasty implant and graft: Secondary | ICD-10-CM | POA: Diagnosis not present

## 2016-05-10 DIAGNOSIS — R0602 Shortness of breath: Secondary | ICD-10-CM | POA: Diagnosis not present

## 2016-05-10 DIAGNOSIS — Z951 Presence of aortocoronary bypass graft: Secondary | ICD-10-CM | POA: Diagnosis not present

## 2016-05-10 DIAGNOSIS — Z8051 Family history of malignant neoplasm of kidney: Secondary | ICD-10-CM

## 2016-05-10 DIAGNOSIS — Z823 Family history of stroke: Secondary | ICD-10-CM

## 2016-05-10 DIAGNOSIS — I10 Essential (primary) hypertension: Secondary | ICD-10-CM | POA: Diagnosis present

## 2016-05-10 DIAGNOSIS — G20 Parkinson's disease: Secondary | ICD-10-CM | POA: Diagnosis present

## 2016-05-10 DIAGNOSIS — Z87891 Personal history of nicotine dependence: Secondary | ICD-10-CM

## 2016-05-10 DIAGNOSIS — J849 Interstitial pulmonary disease, unspecified: Secondary | ICD-10-CM | POA: Diagnosis not present

## 2016-05-10 DIAGNOSIS — E785 Hyperlipidemia, unspecified: Secondary | ICD-10-CM | POA: Diagnosis not present

## 2016-05-10 DIAGNOSIS — E039 Hypothyroidism, unspecified: Secondary | ICD-10-CM | POA: Diagnosis present

## 2016-05-10 DIAGNOSIS — I252 Old myocardial infarction: Secondary | ICD-10-CM

## 2016-05-10 DIAGNOSIS — J441 Chronic obstructive pulmonary disease with (acute) exacerbation: Secondary | ICD-10-CM

## 2016-05-10 DIAGNOSIS — J690 Pneumonitis due to inhalation of food and vomit: Secondary | ICD-10-CM

## 2016-05-10 DIAGNOSIS — I959 Hypotension, unspecified: Secondary | ICD-10-CM | POA: Diagnosis not present

## 2016-05-10 DIAGNOSIS — J9601 Acute respiratory failure with hypoxia: Secondary | ICD-10-CM

## 2016-05-10 DIAGNOSIS — Z9981 Dependence on supplemental oxygen: Secondary | ICD-10-CM | POA: Diagnosis not present

## 2016-05-10 DIAGNOSIS — R05 Cough: Secondary | ICD-10-CM | POA: Diagnosis not present

## 2016-05-10 DIAGNOSIS — I2510 Atherosclerotic heart disease of native coronary artery without angina pectoris: Secondary | ICD-10-CM | POA: Diagnosis not present

## 2016-05-10 LAB — URINALYSIS, ROUTINE W REFLEX MICROSCOPIC
BILIRUBIN URINE: NEGATIVE
Bacteria, UA: NONE SEEN
Glucose, UA: NEGATIVE mg/dL
Hgb urine dipstick: NEGATIVE
Ketones, ur: 5 mg/dL — AB
Leukocytes, UA: NEGATIVE
Nitrite: NEGATIVE
Protein, ur: 30 mg/dL — AB
SPECIFIC GRAVITY, URINE: 1.025 (ref 1.005–1.030)
SQUAMOUS EPITHELIAL / LPF: NONE SEEN
pH: 5 (ref 5.0–8.0)

## 2016-05-10 LAB — CBC WITH DIFFERENTIAL/PLATELET
BASOS ABS: 0 10*3/uL (ref 0.0–0.1)
BASOS PCT: 0 %
EOS ABS: 0.4 10*3/uL (ref 0.0–0.7)
Eosinophils Relative: 6 %
HCT: 36.2 % — ABNORMAL LOW (ref 39.0–52.0)
Hemoglobin: 11.7 g/dL — ABNORMAL LOW (ref 13.0–17.0)
Lymphocytes Relative: 19 %
Lymphs Abs: 1.4 10*3/uL (ref 0.7–4.0)
MCH: 28.3 pg (ref 26.0–34.0)
MCHC: 32.3 g/dL (ref 30.0–36.0)
MCV: 87.7 fL (ref 78.0–100.0)
MONOS PCT: 12 %
Monocytes Absolute: 0.9 10*3/uL (ref 0.1–1.0)
NEUTROS PCT: 63 %
Neutro Abs: 4.5 10*3/uL (ref 1.7–7.7)
Platelets: 265 10*3/uL (ref 150–400)
RBC: 4.13 MIL/uL — ABNORMAL LOW (ref 4.22–5.81)
RDW: 15.6 % — AB (ref 11.5–15.5)
WBC: 7.2 10*3/uL (ref 4.0–10.5)

## 2016-05-10 LAB — CREATININE, SERUM
CREATININE: 0.67 mg/dL (ref 0.61–1.24)
GFR calc Af Amer: >60 mL/min (ref >60)
GFR calc non Af Amer: >60 mL/min (ref >60)

## 2016-05-10 LAB — COMPREHENSIVE METABOLIC PANEL
ALK PHOS: 91 U/L (ref 38–126)
ALT: 7 U/L — AB (ref 17–63)
ANION GAP: 8 (ref 5–15)
AST: 19 U/L (ref 15–41)
Albumin: 3 g/dL — ABNORMAL LOW (ref 3.5–5.0)
BILIRUBIN TOTAL: 0.7 mg/dL (ref 0.3–1.2)
BUN: 10 mg/dL (ref 6–20)
CALCIUM: 8.5 mg/dL — AB (ref 8.9–10.3)
CO2: 31 mmol/L (ref 22–32)
CREATININE: 0.69 mg/dL (ref 0.61–1.24)
Chloride: 101 mmol/L (ref 101–111)
GFR calc Af Amer: >60 mL/min (ref >60)
GFR calc non Af Amer: >60 mL/min (ref >60)
Glucose, Bld: 98 mg/dL (ref 65–99)
Potassium: 3.1 mmol/L — ABNORMAL LOW (ref 3.5–5.1)
SODIUM: 140 mmol/L (ref 135–145)
TOTAL PROTEIN: 6.5 g/dL (ref 6.5–8.1)

## 2016-05-10 LAB — CBC
HCT: 35.9 % — ABNORMAL LOW (ref 39.0–52.0)
Hemoglobin: 11.5 g/dL — ABNORMAL LOW (ref 13.0–17.0)
MCH: 28.2 pg (ref 26.0–34.0)
MCHC: 32 g/dL (ref 30.0–36.0)
MCV: 88 fL (ref 78.0–100.0)
PLATELETS: 271 10*3/uL (ref 150–400)
RBC: 4.08 MIL/uL — AB (ref 4.22–5.81)
RDW: 15.7 % — ABNORMAL HIGH (ref 11.5–15.5)
WBC: 7.4 10*3/uL (ref 4.0–10.5)

## 2016-05-10 LAB — LACTIC ACID, PLASMA: LACTIC ACID, VENOUS: 0.8 mmol/L (ref 0.5–1.9)

## 2016-05-10 LAB — BRAIN NATRIURETIC PEPTIDE: B NATRIURETIC PEPTIDE 5: 187.2 pg/mL — AB (ref 0.0–100.0)

## 2016-05-10 LAB — TROPONIN I

## 2016-05-10 MED ORDER — SODIUM CHLORIDE 0.9 % IV BOLUS (SEPSIS)
1000.0000 mL | Freq: Once | INTRAVENOUS | Status: AC
  Administered 2016-05-10: 1000 mL via INTRAVENOUS

## 2016-05-10 MED ORDER — LEVOTHYROXINE SODIUM 75 MCG PO TABS
150.0000 ug | ORAL_TABLET | Freq: Every day | ORAL | Status: DC
  Administered 2016-05-11 – 2016-05-12 (×2): 150 ug via ORAL
  Filled 2016-05-10 (×2): qty 2

## 2016-05-10 MED ORDER — ACETAMINOPHEN 325 MG PO TABS: 650.0000 mg | ORAL_TABLET | Freq: Four times a day (QID) | ORAL | Status: DC | PRN

## 2016-05-10 MED ORDER — SALINE SPRAY 0.65 % NA SOLN
1.0000 | NASAL | Status: DC | PRN
  Administered 2016-05-10: 1 via NASAL
  Filled 2016-05-10: qty 44

## 2016-05-10 MED ORDER — ATORVASTATIN CALCIUM 20 MG PO TABS
20.0000 mg | ORAL_TABLET | Freq: Every day | ORAL | Status: DC
  Administered 2016-05-10 – 2016-05-11 (×2): 20 mg via ORAL
  Filled 2016-05-10 (×2): qty 1

## 2016-05-10 MED ORDER — CLOPIDOGREL BISULFATE 75 MG PO TABS
75.0000 mg | ORAL_TABLET | Freq: Every day | ORAL | Status: DC
  Administered 2016-05-11 – 2016-05-12 (×2): 75 mg via ORAL
  Filled 2016-05-10 (×2): qty 1

## 2016-05-10 MED ORDER — ALBUTEROL SULFATE (2.5 MG/3ML) 0.083% IN NEBU: 2.5000 mg | INHALATION_SOLUTION | RESPIRATORY_TRACT | Status: DC | PRN

## 2016-05-10 MED ORDER — SODIUM CHLORIDE 0.9% FLUSH: 3.0000 mL | INTRAVENOUS | Status: DC | PRN

## 2016-05-10 MED ORDER — TRAZODONE HCL 50 MG PO TABS
75.0000 mg | ORAL_TABLET | Freq: Every day | ORAL | Status: DC
  Administered 2016-05-10: 75 mg via ORAL
  Administered 2016-05-11: 100 mg via ORAL
  Filled 2016-05-10 (×2): qty 2

## 2016-05-10 MED ORDER — IPRATROPIUM BROMIDE 0.02 % IN SOLN: 0.5000 mg | Freq: Four times a day (QID) | RESPIRATORY_TRACT | Status: DC

## 2016-05-10 MED ORDER — SODIUM CHLORIDE 0.9 % IV SOLN: 250.0000 mL | INTRAVENOUS | Status: DC | PRN

## 2016-05-10 MED ORDER — CARBIDOPA-LEVODOPA 25-100 MG PO TABS
2.0000 | ORAL_TABLET | Freq: Three times a day (TID) | ORAL | Status: DC
  Administered 2016-05-10 – 2016-05-12 (×7): 2 via ORAL
  Filled 2016-05-10 (×7): qty 2

## 2016-05-10 MED ORDER — SENNOSIDES-DOCUSATE SODIUM 8.6-50 MG PO TABS: 1.0000 | ORAL_TABLET | Freq: Every evening | ORAL | Status: DC | PRN

## 2016-05-10 MED ORDER — CARBIDOPA-LEVODOPA ER 50-200 MG PO TBCR
1.0000 | EXTENDED_RELEASE_TABLET | Freq: Every day | ORAL | Status: DC
  Administered 2016-05-10 – 2016-05-11 (×2): 1 via ORAL
  Filled 2016-05-10 (×2): qty 1

## 2016-05-10 MED ORDER — SODIUM CHLORIDE 0.9% FLUSH: 3.0000 mL | Freq: Two times a day (BID) | INTRAVENOUS | Status: DC

## 2016-05-10 MED ORDER — METHYLPREDNISOLONE SODIUM SUCC 40 MG IJ SOLR
40.0000 mg | Freq: Two times a day (BID) | INTRAMUSCULAR | Status: DC
  Administered 2016-05-10 – 2016-05-12 (×4): 40 mg via INTRAVENOUS
  Filled 2016-05-10 (×4): qty 1

## 2016-05-10 MED ORDER — ACETAMINOPHEN 650 MG RE SUPP: 650.0000 mg | Freq: Four times a day (QID) | RECTAL | Status: DC | PRN

## 2016-05-10 MED ORDER — ONDANSETRON HCL 4 MG/2ML IJ SOLN: 4.0000 mg | Freq: Four times a day (QID) | INTRAMUSCULAR | Status: DC | PRN

## 2016-05-10 MED ORDER — POTASSIUM CHLORIDE CRYS ER 20 MEQ PO TBCR
40.0000 meq | EXTENDED_RELEASE_TABLET | Freq: Once | ORAL | Status: AC
  Administered 2016-05-10: 40 meq via ORAL
  Filled 2016-05-10: qty 2

## 2016-05-10 MED ORDER — DEXTROSE 5 % IV SOLN
500.0000 mg | INTRAVENOUS | Status: DC
  Administered 2016-05-10: 500 mg via INTRAVENOUS
  Filled 2016-05-10 (×2): qty 500

## 2016-05-10 MED ORDER — ONDANSETRON HCL 4 MG PO TABS: 4.0000 mg | ORAL_TABLET | Freq: Four times a day (QID) | ORAL | Status: DC | PRN

## 2016-05-10 MED ORDER — FLUTICASONE FUROATE-VILANTEROL 200-25 MCG/INH IN AEPB
1.0000 | INHALATION_SPRAY | Freq: Every day | RESPIRATORY_TRACT | Status: DC
  Administered 2016-05-11 – 2016-05-12 (×2): 1 via RESPIRATORY_TRACT
  Filled 2016-05-10: qty 28

## 2016-05-10 MED ORDER — ALBUTEROL SULFATE (2.5 MG/3ML) 0.083% IN NEBU: 2.5000 mg | INHALATION_SOLUTION | Freq: Four times a day (QID) | RESPIRATORY_TRACT | Status: DC

## 2016-05-10 MED ORDER — HEPARIN SODIUM (PORCINE) 5000 UNIT/ML IJ SOLN
5000.0000 [IU] | Freq: Three times a day (TID) | INTRAMUSCULAR | Status: DC
  Administered 2016-05-10 – 2016-05-12 (×7): 5000 [IU] via SUBCUTANEOUS
  Filled 2016-05-10 (×7): qty 1

## 2016-05-10 MED ORDER — DEXTROSE 5 % IV SOLN
1.0000 g | INTRAVENOUS | Status: DC
  Administered 2016-05-10 – 2016-05-12 (×3): 1 g via INTRAVENOUS
  Filled 2016-05-10 (×3): qty 10

## 2016-05-10 MED ORDER — MIRABEGRON ER 50 MG PO TB24
50.0000 mg | ORAL_TABLET | Freq: Every day | ORAL | Status: DC
  Administered 2016-05-11 – 2016-05-12 (×2): 50 mg via ORAL
  Filled 2016-05-10 (×2): qty 1

## 2016-05-10 MED ORDER — SODIUM CHLORIDE 0.9 % IV SOLN
INTRAVENOUS | Status: DC
  Administered 2016-05-10: 14:00:00 via INTRAVENOUS
  Administered 2016-05-11: 50 mL/h via INTRAVENOUS

## 2016-05-10 MED ORDER — BUSPIRONE HCL 5 MG PO TABS
5.0000 mg | ORAL_TABLET | Freq: Every day | ORAL | Status: DC
  Administered 2016-05-10 – 2016-05-12 (×3): 5 mg via ORAL
  Filled 2016-05-10 (×3): qty 1

## 2016-05-10 MED ORDER — IPRATROPIUM-ALBUTEROL 0.5-2.5 (3) MG/3ML IN SOLN
3.0000 mL | Freq: Four times a day (QID) | RESPIRATORY_TRACT | Status: DC
  Administered 2016-05-10 – 2016-05-12 (×8): 3 mL via RESPIRATORY_TRACT
  Filled 2016-05-10 (×9): qty 3

## 2016-05-10 MED ORDER — SODIUM CHLORIDE 0.9% FLUSH
3.0000 mL | Freq: Two times a day (BID) | INTRAVENOUS | Status: DC
  Administered 2016-05-10 – 2016-05-12 (×3): 3 mL via INTRAVENOUS

## 2016-05-10 MED ORDER — FLUTICASONE PROPIONATE 50 MCG/ACT NA SUSP
2.0000 | Freq: Every day | NASAL | Status: DC
Start: 2016-05-10 — End: 2016-05-10

## 2016-05-10 NOTE — Patient Instructions (Addendum)
Admit  

## 2016-05-10 NOTE — Assessment & Plan Note (Addendum)
Exacerbation with associated hypotension ? PNA  Need admit to SDU . unfornuately there at no beds available at [facility]  Transfer to ER via EMS.  Case reviewed with Dr. Elsworth Soho  w/ exam .

## 2016-05-10 NOTE — Care Management Note (Signed)
Case Management Note  Patient Details  Name: Bradley Bennett MRN: [id number] Date of Birth: Jun 09, 1932  Subjective/Objective:83 y/o m admitted w/COPD. From [facility]. PT cons-await recc.                    Action/Plan:d/c plan home.   Expected Discharge Date:   (unknown)               Expected Discharge Plan:  [place]  In-House Referral:  Clinical Social Work  Discharge planning Services  CM Consult  Post Acute Care Choice:    Choice offered to:     DME Arranged:    DME Agency:     HH Arranged:    [place] Agency:     Status of Service:  In process, will continue to follow  If discussed at Long Length of Stay Meetings, dates discussed:    Additional Comments:  Dessa Phi, RN 05/10/2016, 3:30 PM

## 2016-05-10 NOTE — Progress Notes (Signed)
@Patient  ID: Bradley Bennett, male    DOB: 03-Mar-1932, 83 y.o.   MRN: [id number]  Chief Complaint  Patient presents with  . Acute Visit    COPD     Referring provider: [name], MD  HPI: 83 yo male former smoker followed for COPD , chronic aspiration in setting of Parkinsons Dz, O2 RF , and ILD   TEST  MBS 2015 + aspiration .  05/10/2016 Acute OV : COPD  Pt presents for an acute office visit . Complains of 1 week of increased cough, rattle in chest , congestion with green mucus , more sob and some wheezing . No fever, or hemoptysis .  Feels very weak, no energy . Worse he has felt in long time since last time he aspirated. Denies n/v/d. Eating okay. Says he is having to turn Oxygen up to 15l/m walking to get his breath. On arrival to office today SBP 80-90.   Referral for palliative care but did not feel this was helpful .  Previous hospice eval, no candidate due to pulm rehab.    No Known Allergies  Immunization History  Administered Date(s) Administered  . Influenza Split 11/09/2014  . Influenza, High Dose Seasonal PF 12/04/2015  . Pneumococcal Conjugate-13 01/14/2016  . Pneumococcal Polysaccharide-23 10/12/2014    Past Medical History:  Diagnosis Date  . Arthritis   . Asthma   . Coronary artery disease   . Emphysema of lung ([place])   . Hearing difficulty of both ears   . Heart attack   . Heart murmur   . Hyperlipidemia   . Hypertension   . ILD (interstitial lung disease) ([place])   . Parkinson disease ([place])     Tobacco History: History  Smoking Status  . Former Smoker  . Packs/day: 2.00  . Years: 35.00  . Quit date: 08/15/1985  Smokeless Tobacco  . Never Used    Comment: quit smoking in 1992   Counseling given: Not Answered   Outpatient Encounter Prescriptions as of 05/10/2016  Medication Sig  . acetaminophen (TYLENOL) 500 MG tablet Take 500 mg by mouth as needed.  Marland Kitchen atorvastatin (LIPITOR) 20 MG tablet Take 1 tablet (20 mg total) by mouth daily.    . busPIRone (BUSPAR) 5 MG tablet Take 1 tablet (5 mg total) by mouth daily.  . carbidopa-levodopa (SINEMET CR) 50-200 MG tablet Take 1 tablet by mouth at bedtime.  . carbidopa-levodopa (SINEMET IR) 25-100 MG tablet Take 2 tablets by mouth 3 (three) times daily.  . clopidogrel (PLAVIX) 75 MG tablet Take 1 tablet (75 mg total) by mouth daily.  . finasteride (PROSCAR) 5 MG tablet Take 1 tablet (5 mg total) by mouth daily.  . Fluticasone-Salmeterol (ADVAIR DISKUS) 500-50 MCG/DOSE AEPB Inhale 1 puff into the lungs 2 (two) times daily.  Marland Kitchen levothyroxine (SYNTHROID, LEVOTHROID) 150 MCG tablet Take 1 tablet (150 mcg total) by mouth daily.  . mirabegron ER (MYRBETRIQ) 25 MG TB24 tablet Take 1 tablet (25 mg total) by mouth daily. (Patient taking differently: Take 50 mg by mouth daily. )  . Multiple Vitamins-Minerals (CENTRUM VITAMINTS PO) Take 1 tablet by mouth daily.   . tamsulosin (FLOMAX) 0.4 MG CAPS capsule Take 1 capsule (0.4 mg total) by mouth daily.  . traZODone (DESYREL) 50 MG tablet Take 1.5-2 tablets (75-100 mg total) by mouth at bedtime.  Marland Kitchen umeclidinium bromide (INCRUSE ELLIPTA) 62.5 MCG/INH AEPB Inhale 1 puff into the lungs daily.  . fluticasone (FLONASE) 50 MCG/ACT nasal spray Place 2 sprays  into both nostrils daily. (Patient not taking: Reported on 05/10/2016)   No facility-administered encounter medications on file as of 05/10/2016.      Review of Systems  Constitutional:   No  weight loss, night sweats,  Fevers, chills,  +fatigue, or  lassitude.  HEENT:   No headaches,  Difficulty swallowing,  Tooth/dental problems, or  Sore throat,                No sneezing, itching, ear ache,  +nasal congestion, post nasal drip,   CV:  No chest pain,  Orthopnea, PND, swelling in lower extremities, anasarca, dizziness, palpitations, syncope.   GI  No heartburn, indigestion, abdominal pain, nausea, vomiting, diarrhea, change in bowel habits, loss of appetite, bloody stools.   Resp:    No chest wall  deformity  Skin: no rash or lesions.  GU: no dysuria, change in color of urine, no urgency or frequency.  No flank pain, no hematuria   MS:  No joint pain or swelling.  No decreased range of motion.  No back pain.    Physical Exam  BP (!) 90/56 (BP Location: Left Arm, Patient Position: Sitting, Cuff Size: Normal)   Pulse 73   Ht 5' 10.5" (1.791 m)   Wt 188 lb 3.2 oz (85.4 kg)   SpO2 98%   BMI 26.62 kg/m   GEN: A/Ox3; pleasant , NAD, chronically ill appearing , on o2    HEENT:  [place]/AT,  EACs-clear, TMs-wnl, NOSE-clear, THROAT-clear, no lesions, no postnasal drip or exudate noted.   NECK:  Supple w/ fair ROM; no JVD; normal carotid impulses w/o bruits; no thyromegaly or nodules palpated; no lymphadenopathy.    RESP  BB crackles , no accessory muscle use, no dullness to percussion  CARD:  RRR, no m/r/g, no peripheral edema, pulses intact, no cyanosis or clubbing.  GI:   Soft & nt; nml bowel sounds; no organomegaly or masses detected.   Musco: Warm bil, no deformities or joint swelling noted.   Neuro: alert, no focal deficits noted.    Skin: Warm, no lesions or rashes    Lab Results:  CBC    Component Value Date/Time   WBC 7.0 07/23/2015 0853   RBC 5.08 07/23/2015 0853   HGB 14.1 07/23/2015 0853   HCT 43.6 07/23/2015 0853   PLT 264.0 07/23/2015 0853   MCV 85.8 07/23/2015 0853   MCHC 32.4 07/23/2015 0853   RDW 19.1 (H) 07/23/2015 0853   LYMPHSABS 2.2 07/23/2015 0853   MONOABS 0.7 07/23/2015 0853   EOSABS 0.3 07/23/2015 0853   BASOSABS 0.0 07/23/2015 0853    BMET    Component Value Date/Time   NA 139 01/14/2016 0937   K 4.2 01/14/2016 0937   CL 104 01/14/2016 0937   CO2 29 01/14/2016 0937   GLUCOSE 95 01/14/2016 0937   BUN 13 01/14/2016 0937   CREATININE 0.74 01/14/2016 0937   CALCIUM 9.1 01/14/2016 0937    BNP No results found for: BNP  ProBNP No results found for: PROBNP  Imaging: No results found.   Assessment & Plan:   COPD (chronic  obstructive pulmonary disease) (HCC) Exacerbation with associated hypotension ? PNA  Need admit to SDU . unfornuately there at no beds available at [facility]  Transfer to ER via EMS.  Case reviewed with Dr. Elsworth Soho  w/ exam .      Rexene Edison, NP 05/10/2016

## 2016-05-10 NOTE — H&P (Signed)
Name: Bradley Bennett MRN: [id number] DOB: Jul 04, 1932    ADMISSION DATE:  05/10/2016  CHIEF COMPLAINT:  "Feel lousy"   BRIEF PATIENT DESCRIPTION: 83 year old male former smoker followed for COPD, chronic aspiration in setting, Parkinson's disease, chronic oxygen dependent respiratory failure and ILD.  SIGNIFICANT EVENTS  05/10/2016 admitted from the office  STUDIES:     HISTORY OF PRESENT ILLNESS:   83 year old male patient of Dr. Anastasia Pall followed for COPD ILD and chronic oxygen dependent respiratory failure. He presented to the office on 05/10/2016 with one-week history of increased cough, congestion, rattling in the chest with thick green mucus more shortness of breath and intermittent wheezing. Patient says he has felt lousy over the last several days. Has been very weak with no energy. Has had to turn his oxygen up to 15 L to catch breath and keep oxygen above 90%.  At baseline, patient is supposed to be on 6 L of oxygen and 10 L with activity.  On arrival to the office today. Patient was very weak. Blood pressure was low at systolic pressure of 90. Patient denies any nausea, vomiting or diarrhea. He does have a history of aspiration. Previous modified barium swallow did show positive aspiration. Patient does have underlying Parkinson's disease. She does participate in pulmonary rehabilitation. He has been evaluated by hospice in the past but because of pulmonary rehabilitation was not felt to be a candidate. He was recently evaluated by a palliative care . but family did not fill it. They were ready for this or much benefit. Pt will require admission for further evaluation .    PAST MEDICAL HISTORY :   has a past medical history of Arthritis; Asthma; Coronary artery disease; Emphysema of lung ([place]); Hearing difficulty of both ears; Heart attack; Heart murmur; Hyperlipidemia; Hypertension; ILD (interstitial lung disease) ([place]); and Parkinson disease ([place]).  has a past surgical  history that includes Vein bypass surgery; Coronary angioplasty with stent; Carpal tunnel release (Right, 03/2014); Hernia repair (10/2013); and Coronary artery bypass graft. Prior to Admission medications   Medication Sig Start Date End Date Taking? Authorizing Provider  acetaminophen (TYLENOL) 500 MG tablet Take 500 mg by mouth as needed.    Historical Provider, MD  atorvastatin (LIPITOR) 20 MG tablet Take 1 tablet (20 mg total) by mouth daily. 02/08/16   Sueanne Margarita, MD  busPIRone (BUSPAR) 5 MG tablet Take 1 tablet (5 mg total) by mouth daily. 04/24/16   [name], MD  carbidopa-levodopa (SINEMET CR) 50-200 MG tablet Take 1 tablet by mouth at bedtime. 04/24/16   Rebecca S Tat, DO  carbidopa-levodopa (SINEMET IR) 25-100 MG tablet Take 2 tablets by mouth 3 (three) times daily. 04/24/16   Eustace Quail Tat, DO  clopidogrel (PLAVIX) 75 MG tablet Take 1 tablet (75 mg total) by mouth daily. 04/24/16   [name], MD  finasteride (PROSCAR) 5 MG tablet Take 1 tablet (5 mg total) by mouth daily. 02/11/16   [name], MD  fluticasone (FLONASE) 50 MCG/ACT nasal spray Place 2 sprays into both nostrils daily. Patient not taking: Reported on 05/10/2016 01/14/16   [name], MD  Fluticasone-Salmeterol (ADVAIR DISKUS) 500-50 MCG/DOSE AEPB Inhale 1 puff into the lungs 2 (two) times daily.    Historical Provider, MD  levothyroxine (SYNTHROID, LEVOTHROID) 150 MCG tablet Take 1 tablet (150 mcg total) by mouth daily. 04/24/16   [name], MD  mirabegron ER (MYRBETRIQ) 25 MG TB24 tablet Take 1 tablet (25 mg total) by  mouth daily. Patient taking differently: Take 50 mg by mouth daily.  01/14/16   [name], MD  Multiple Vitamins-Minerals (CENTRUM VITAMINTS PO) Take 1 tablet by mouth daily.     Historical Provider, MD  tamsulosin (FLOMAX) 0.4 MG CAPS capsule Take 1 capsule (0.4 mg total) by mouth daily. 04/24/16   [name], MD  traZODone (DESYREL) 50 MG tablet Take 1.5-2 tablets (75-100 mg total) by mouth at  bedtime. 04/24/16   [name], MD  umeclidinium bromide (INCRUSE ELLIPTA) 62.5 MCG/INH AEPB Inhale 1 puff into the lungs daily.    Historical Provider, MD   No Known Allergies  FAMILY HISTORY:  family history includes Arthritis in his mother and sister; Colon cancer in his father; Heart disease in his brother; Kidney cancer in his brother; Stroke in his father. SOCIAL HISTORY:  reports that he quit smoking about 30 years ago. He has a 70.00 pack-year smoking history. He has never used smokeless tobacco. He reports that he drinks alcohol. He reports that he does not use drugs.  REVIEW OF SYSTEMS:   Constitutional:   No  weight loss, night sweats,  Fevers, chills, + fatigue, or  lassitude.  HEENT:   No headaches,  Difficulty swallowing,  Tooth/dental problems, or  Sore throat,                No sneezing, itching, ear ache,  +nasal congestion, post nasal drip,   CV:  No chest pain,  Orthopnea, PND, swelling in lower extremities, anasarca, dizziness, palpitations, syncope.   GI  No heartburn, indigestion, abdominal pain, nausea, vomiting, diarrhea, change in bowel habits, loss of appetite, bloody stools.   Resp:    No chest wall deformity  Skin: no rash or lesions.  GU: no dysuria, change in color of urine, no urgency or frequency.  No flank pain, no hematuria   MS:  No joint pain or swelling.  No decreased range of motion.  No back pain.  Psych:  No change in mood or affect. No depression or anxiety.  No memory loss.      SUBJECTIVE:   VITAL SIGNS: Pulse Rate:  [73] 73 (04/04 1041) BP: (90)/(56) 90/56 (04/04 1041) SpO2:  [98 %] 98 % (04/04 1041) Weight:  [184 lb 4.9 oz (83.6 kg)-188 lb 3.2 oz (85.4 kg)] 184 lb 4.9 oz (83.6 kg) (04/04 1217)  PHYSICAL EXAMINATION: GEN: A/Ox3; pleasant , NAD, chronically ill appearing on O2 , elderly    HEENT:  [place]/AT,  EACs-clear, TMs-wnl, NOSE-clear, THROAT-clear, no lesions, no postnasal drip or exudate noted.   NECK:  Supple w/ fair  ROM; no JVD; normal carotid impulses w/o bruits; no thyromegaly or nodules palpated; no lymphadenopathy.    RESP  BB crackles ,  no accessory muscle use, no dullness to percussion  CARD:  RRR, no m/r/g  , no peripheral edema, pulses intact, no cyanosis or clubbing.  GI:   Soft & nt; nml bowel sounds; no organomegaly or masses detected.   Musco: Warm bil, no deformities or joint swelling noted.   Neuro: alert, no focal deficits noted.    Skin: Warm, no lesions or rashes   No results for input(s): NA, K, CL, CO2, BUN, CREATININE, GLUCOSE in the last 168 hours. No results for input(s): HGB, HCT, WBC, PLT in the last 168 hours. No results found.  ASSESSMENT / PLAN: 1. COPD exacerbation ? CAP  Admit to Tele .  Check cxr  Add CAP coverage with Rocephin /Zithromax.  Check labs with bnp, troponin and cbc  Add Duoneb Four times a day   Albuterol As needed   Cont Advair .   2. Acute on chronic hypoxic resp failure  O2 to keep O2 sats >90%.   3. Hypotension -Mild  Give 1L NS bolus over 2hr then 50cc/hr .  Check lactate .  Hold flomax /proscar for now, if b/p improves restart in am   4. Parkinsons Cont current regimen    Best practice  DVT prophylaxis -Hep sq  Clarify code status.   Rexene Edison NP-C  Pulmonary and [place] Pager: [phone]  05/10/2016, 12:53 PM

## 2016-05-11 ENCOUNTER — Encounter (HOSPITAL_COMMUNITY): Admission: RE | Admit: 2016-05-11 | Payer: Self-pay | Source: Ambulatory Visit

## 2016-05-11 DIAGNOSIS — J849 Interstitial pulmonary disease, unspecified: Secondary | ICD-10-CM

## 2016-05-11 DIAGNOSIS — J9621 Acute and chronic respiratory failure with hypoxia: Secondary | ICD-10-CM

## 2016-05-11 LAB — BASIC METABOLIC PANEL
ANION GAP: 9 (ref 5–15)
BUN: 11 mg/dL (ref 6–20)
CO2: 27 mmol/L (ref 22–32)
CREATININE: 0.68 mg/dL (ref 0.61–1.24)
Calcium: 8.4 mg/dL — ABNORMAL LOW (ref 8.9–10.3)
Chloride: 103 mmol/L (ref 101–111)
GFR calc Af Amer: >60 mL/min (ref >60)
GLUCOSE: 143 mg/dL — AB (ref 65–99)
Potassium: 4.4 mmol/L (ref 3.5–5.1)
Sodium: 139 mmol/L (ref 135–145)

## 2016-05-11 LAB — CBC
HCT: 35.7 % — ABNORMAL LOW (ref 39.0–52.0)
Hemoglobin: 11.6 g/dL — ABNORMAL LOW (ref 13.0–17.0)
MCH: 29.4 pg (ref 26.0–34.0)
MCHC: 32.5 g/dL (ref 30.0–36.0)
MCV: 90.4 fL (ref 78.0–100.0)
PLATELETS: 244 10*3/uL (ref 150–400)
RBC: 3.95 MIL/uL — ABNORMAL LOW (ref 4.22–5.81)
RDW: 15.7 % — AB (ref 11.5–15.5)
WBC: 5.3 10*3/uL (ref 4.0–10.5)

## 2016-05-11 LAB — PROCALCITONIN: Procalcitonin: 0.1 ng/mL

## 2016-05-11 MED ORDER — FLUTICASONE PROPIONATE 50 MCG/ACT NA SUSP
1.0000 | Freq: Every day | NASAL | Status: DC
  Administered 2016-05-11 – 2016-05-12 (×2): 1 via NASAL
  Filled 2016-05-11: qty 16

## 2016-05-11 MED ORDER — FINASTERIDE 5 MG PO TABS
5.0000 mg | ORAL_TABLET | Freq: Every evening | ORAL | Status: DC
  Administered 2016-05-11: 5 mg via ORAL
  Filled 2016-05-11: qty 1

## 2016-05-11 MED ORDER — SALINE SPRAY 0.65 % NA SOLN
1.0000 | Freq: Two times a day (BID) | NASAL | Status: DC
  Administered 2016-05-11 – 2016-05-12 (×3): 1 via NASAL
  Filled 2016-05-11: qty 44

## 2016-05-11 MED ORDER — AZITHROMYCIN 250 MG PO TABS
500.0000 mg | ORAL_TABLET | Freq: Every day | ORAL | Status: DC
  Administered 2016-05-11 – 2016-05-12 (×2): 500 mg via ORAL
  Filled 2016-05-11 (×2): qty 2

## 2016-05-11 MED ORDER — TAMSULOSIN HCL 0.4 MG PO CAPS
0.4000 mg | ORAL_CAPSULE | Freq: Every evening | ORAL | Status: DC
  Administered 2016-05-11: 0.4 mg via ORAL
  Filled 2016-05-11: qty 1

## 2016-05-11 NOTE — Care Management Note (Signed)
Case Management Note  Patient Details  Name: Bradley Bennett MRN: [id number] Date of Birth: [birth year]  Subjective/Objective: Noted PT-no f/u. D/c plan return back to [employer].                   Action/Plan:d/c home.   Expected Discharge Date:   (unknown)               Expected Discharge Plan:  Home/Self Care  In-House Referral:  Clinical Social Work  Discharge planning Services  CM Consult  Post Acute Care Choice:    Choice offered to:     DME Arranged:    DME Agency:     HH Arranged:    HH Agency:     Status of Service:  In process, will continue to follow  If discussed at Long Length of Stay Meetings, dates discussed:    Additional Comments:  Dessa Phi, RN 05/11/2016, 12:20 PM

## 2016-05-11 NOTE — Care Management Note (Signed)
Case Management Note  Patient Details  Name: JOHNATHYN VISCOMI MRN: [id number] Date of Birth: 1933-01-11  Subjective/Objective:  83 y/o m admitted w/COPD. From home.PT cons-await recc.                  Action/Plan:d/c plan home.   Expected Discharge Date:   (unknown)               Expected Discharge Plan:  [place]  In-House Referral:  Clinical Social Work  Discharge planning Services  CM Consult  Post Acute Care Choice:    Choice offered to:     DME Arranged:    DME Agency:     HH Arranged:    [place] Agency:     Status of Service:  In process, will continue to follow  If discussed at Long Length of Stay Meetings, dates discussed:    Additional Comments:  Dessa Phi, RN 05/11/2016, 12:10 PM

## 2016-05-11 NOTE — Progress Notes (Signed)
Patient had 15 beats of V-Tach.  Patient was asleep at the time. PCP on call was notified

## 2016-05-11 NOTE — Progress Notes (Signed)
Name: Bradley Bennett MRN: [id number] DOB: 1933/01/29    ADMISSION DATE:  05/10/2016  CHIEF COMPLAINT:  "Feel lousy"   BRIEF PATIENT DESCRIPTION: 83 year old male former smoker followed for COPD, chronic aspiration in setting, Parkinson's disease, chronic oxygen dependent respiratory failure and ILD admitted from the office 4/4 for increased cough, occasional sputum production, increased nasal stuffiness, weakness and incidential finding of low blood pressure (systolic 46'N).   At baseline, patient is supposed to be on 6 L of oxygen and 10 L with activity. He does have a history of aspiration. Previous modified barium swallow did show positive aspiration. Patient does have underlying Parkinson's disease. He does participate in pulmonary rehabilitation. He has been evaluated by hospice in the past but because of pulmonary rehabilitation was not felt to be a candidate. He was recently evaluated by a palliative care.   SUBJECTIVE: Pt reports feeling better.  O2 at 10L.  Denies fevers.  Reports ongoing nasal congestion > has been using 4-way nasal spray at home.   VITAL SIGNS: Temp:  [97.6 F (36.4 C)-98.7 F (37.1 C)] 98.1 F (36.7 C) (04/05 0542) Pulse Rate:  [66-81] 66 (04/05 0542) Resp:  [18] 18 (04/05 0542) BP: (90-119)/(56-83) 107/83 (04/05 0542) SpO2:  [95 %-99 %] 98 % (04/05 0836) Weight:  [184 lb 4.9 oz (83.6 kg)-188 lb 3.2 oz (85.4 kg)] 185 lb 4.8 oz (84.1 kg) (04/05 0542)  PHYSICAL EXAMINATION: General:  Elderly male in NAD, sitting on side of bed  HEENT: MM pink/moist, good dentition Neuro: AAOx4, speech clear, MAE CV: s1s2 rrr, no m/r/g PULM: even/non-labored, lungs bilaterally clear with good air movement  GE:XBMW, non-tender, bsx4 active  Extremities: warm/dry, no edema  Skin: no rashes or lesions    Recent Labs Lab 05/10/16 1405 05/11/16 0530  NA 140 139  K 3.1* 4.4  CL 101 103  CO2 31 27  BUN 10 11  CREATININE 0.69  0.67 0.68  GLUCOSE 98 143*     Recent Labs Lab 05/10/16 1405 05/11/16 0530  HGB 11.5*  11.7* 11.6*  HCT 35.9*  36.2* 35.7*  WBC 7.4  7.2 5.3  PLT 271  265 244   Portable Chest 1 View  Result Date: 05/10/2016 CLINICAL DATA:  COPD exacerbation. Increased productive cough and chest congestion. Increased shortness of breath. Wheezing. EXAM: PORTABLE CHEST 1 VIEW COMPARISON:  Chest x-ray dated 03/10/2016 and 07/28/2015 FINDINGS: The patient has severe chronic interstitial and obstructive lung disease. Interstitial markings are diffusely slightly increased as compared to the prior study but there are no discrete consolidative infiltrates or effusions. Heart size is at the upper limits of normal. Chronic prominence of the inferior aspect of the right hilum, unchanged since 2017. No acute bone abnormality. IMPRESSION: Severe chronic interstitial and obstructive lung disease. Increased accentuation since the prior study may represent inflammation superimposed on the chronic lung disease. Electronically Signed   By: Lorriane Shire M.D.   On: 05/10/2016 13:16   SIGNIFICANT EVENTS  4/04  Admitted from the office  STUDIES:     ASSESSMENT / PLAN:   Acute Exacerbation of COPD  Rule Out CAP  Plan: Consider d/c abx and monitor off as no increase in WBC or fevers  Trend WBC / fever curve  Duoneb QID  Albuterol PRN  Continue Breo (on Advair at home) Add flonase + nasal saline  Acute on chronic hypoxic resp failure  Plan: Continue O2 for sats > 92%  Hypotension - mild, resolved.  Pt has hx of intermittently  low BP and is asymptomatic.  ? If related to proscar & flomax use  Plan: Decrease NS to KVO  Resume flomax / proscar and monitor BP trend   Parkinsons Plan: Continue current medications   Hypothyroid  Plan: Continue synthroid   Best practice  DVT prophylaxis -Hep sq    Noe Gens, NP-C [facility] Pgr: [phone] or if no answer [phone] 05/11/2016, 10:13 AM

## 2016-05-11 NOTE — Progress Notes (Signed)
Found pt on 6L HFNC when entered the room. Sats stable. Neb tx done

## 2016-05-11 NOTE — Progress Notes (Signed)
PHARMACIST - PHYSICIAN COMMUNICATION  CONCERNING: Antibiotic IV to Oral Route Change Policy  RECOMMENDATION: This patient is receiving azithromycin by the intravenous route.  Based on criteria approved by the Pharmacy and Therapeutics Committee, the antibiotic(s) is/are being converted to the equivalent oral dose form(s).   DESCRIPTION: These criteria include:  Patient being treated for a respiratory tract infection, urinary tract infection, cellulitis or clostridium difficile associated diarrhea if on metronidazole  The patient is not neutropenic and does not exhibit a GI malabsorption state  The patient is eating (either orally or via tube) and/or has been taking other orally administered medications for a least 24 hours  The patient is improving clinically and has a Tmax < 100.5  If you have questions about this conversion, please contact the Pharmacy Department  []  ( [phone] )  [place] []  ( [phone]  [facility] []  ( [phone] )  [place] []  ( [phone] )  [facility] [x]  ( [phone] )  [facility]  

## 2016-05-11 NOTE — Evaluation (Signed)
Physical Therapy One Time Evaluation Patient Details Name: Bradley Bennett MRN: [id number] DOB: 03/09/1932 Today's Date: 05/11/2016   History of Present Illness  83 year old with Parkinson's disease, COPD and chronic aspiration causing ILD, at baseline on 6-10 L of oxygen was admitted directly from the office due to hypotension and worsening dyspnea and hypoxia, diagnosed with acute on chronic respiratory failure.  Clinical Impression  Patient evaluated by Physical Therapy with no further acute PT needs identified. All education has been completed and the patient has no further questions.  Pt tolerated good distance ambulation well.  Pt states his O2 needs range from 6-15L depending on his activity at home.  Pt appears to present near baseline.  Recommend nursing ambulate with pt during acute stay. See below for any follow-up Physical Therapy or equipment needs. PT is signing off. Thank you for this referral.     Follow Up Recommendations No PT follow up    Equipment Recommendations  None recommended by PT    Recommendations for Other Services       Precautions / Restrictions Precautions Precaution Comments: chronic 6-15L oxygen      Mobility  Bed Mobility Overal bed mobility: Modified Independent                Transfers Overall transfer level: Needs assistance Equipment used: None Transfers: Sit to/from Stand Sit to Stand: Min guard;Supervision            Ambulation/Gait Ambulation/Gait assistance: Min guard;Supervision Ambulation Distance (Feet): 400 Feet Assistive device: None Gait Pattern/deviations: Step-through pattern;Decreased stride length     General Gait Details: pt steady, pushed IV pole but not necessary for support, SPo2 dropped to 81% and tank on 15L however empty (after 40 ft) so quickly applied new tank on 15L and Spo2 improved to 93% with pursed lip breathing and seated rest break, Spo2 90-100% for the rest of ambulation on 15L   Stairs            Wheelchair Mobility    Modified Rankin (Stroke Patients Only)       Balance                                             Pertinent Vitals/Pain Pain Assessment: No/denies pain    Home Living       Type of Home: Independent living facility         Home Equipment: Gilford Rile - 2 wheels;Cane - single point      Prior Function Level of Independence: Independent         Comments: from ILF, ambulates to dining hall     Hand Dominance        Extremity/Trunk Assessment        Lower Extremity Assessment Lower Extremity Assessment: Overall WFL for tasks assessed       Communication   Communication: HOH  Cognition Arousal/Alertness: Awake/alert Behavior During Therapy: WFL for tasks assessed/performed Overall Cognitive Status: Within Functional Limits for tasks assessed                                        General Comments      Exercises     Assessment/Plan    PT Assessment Patent does not need any further PT services  PT Problem  List         PT Treatment Interventions      PT Goals (Current goals can be found in the Care Plan section)  Acute Rehab PT Goals PT Goal Formulation: All assessment and education complete, DC therapy    Frequency     Barriers to discharge        Co-evaluation               End of Session Equipment Utilized During Treatment: Gait belt;Oxygen Activity Tolerance: Patient tolerated treatment well Patient left: in chair;with call bell/phone within reach Nurse Communication: Mobility status PT Visit Diagnosis: Difficulty in walking, not elsewhere classified (R26.2)    Time: 3903-0092 PT Time Calculation (min) (ACUTE ONLY): 19 min   Charges:   PT Evaluation $PT Eval Low Complexity: 1 Procedure     PT G CodesCarmelia Bake, PT, DPT 05/11/2016 Pager: [phone]   York Ram E 05/11/2016, 12:35 PM

## 2016-05-12 LAB — BASIC METABOLIC PANEL
Anion gap: 7 (ref 5–15)
BUN: 13 mg/dL (ref 6–20)
CALCIUM: 8.8 mg/dL — AB (ref 8.9–10.3)
CO2: 28 mmol/L (ref 22–32)
CREATININE: 0.65 mg/dL (ref 0.61–1.24)
Chloride: 104 mmol/L (ref 101–111)
GFR calc non Af Amer: >60 mL/min (ref >60)
Glucose, Bld: 128 mg/dL — ABNORMAL HIGH (ref 65–99)
Potassium: 4.6 mmol/L (ref 3.5–5.1)
SODIUM: 139 mmol/L (ref 135–145)

## 2016-05-12 LAB — CBC
HCT: 35.2 % — ABNORMAL LOW (ref 39.0–52.0)
Hemoglobin: 11.1 g/dL — ABNORMAL LOW (ref 13.0–17.0)
MCH: 27.7 pg (ref 26.0–34.0)
MCHC: 31.5 g/dL (ref 30.0–36.0)
MCV: 87.8 fL (ref 78.0–100.0)
Platelets: 294 10*3/uL (ref 150–400)
RBC: 4.01 MIL/uL — ABNORMAL LOW (ref 4.22–5.81)
RDW: 15.6 % — AB (ref 11.5–15.5)
WBC: 10.1 10*3/uL (ref 4.0–10.5)

## 2016-05-12 LAB — PROCALCITONIN

## 2016-05-12 MED ORDER — FINASTERIDE 5 MG PO TABS: 5.0000 mg | ORAL_TABLET | Freq: Every evening | ORAL | Status: AC

## 2016-05-12 MED ORDER — SALINE SPRAY 0.65 % NA SOLN: 1.0000 | NASAL | 2 refills | Status: AC | PRN

## 2016-05-12 MED ORDER — TAMSULOSIN HCL 0.4 MG PO CAPS: 0.4000 mg | ORAL_CAPSULE | Freq: Every evening | ORAL | Status: DC

## 2016-05-12 MED ORDER — PREDNISONE 10 MG PO TABS: ORAL_TABLET | ORAL | 0 refills | Status: DC

## 2016-05-12 MED ORDER — FLUTICASONE PROPIONATE 50 MCG/ACT NA SUSP: 1.0000 | Freq: Every day | NASAL | 2 refills | Status: DC

## 2016-05-12 NOTE — Discharge Summary (Signed)
Physician Discharge Summary  Patient ID: Bradley Bennett MRN: [id number] DOB/AGE: 1933-01-26 83 y.o.  Admit date: 05/10/2016 Discharge date: 05/12/2016    Discharge Diagnoses:  Acute Exacerbation of COPD PNA Ruled Out  Acute on Chronic Hypoxic Respiratory Failure  ILD Hypotension  Parkinson's Disease  Hypothyroidism                                                                        DISCHARGE PLAN BY DIAGNOSIS     Acute Exacerbation of COPD PNA Ruled Out  Acute on Chronic Hypoxic Respiratory Failure  ILD  Discharge Plan: Prednisone taper to off Continue home O2 6-10L with exertion Follow up with Dr. Lake Bells as arranged Resume home Advair Continue flonase + nasal saline   Continue Pulmonary rehab  Hypotension   Discharge Plan: Resolved.  Follow up BP with PCP.  ? If BPH medications contributing to soft pressures.    Parkinson's Disease   Discharge Plan: Continue current medications, see below   Hypothyroidism   Discharge Plan: Continue synthroid                  DISCHARGE SUMMARY   Bradley Bennett 83 year old male former smoker followed for COPD, chronic aspiration in setting, Parkinson's disease, chronic oxygen dependent respiratory failure and ILD admitted from the office 4/4 for increased cough, occasional sputum production, increased nasal stuffiness, weakness and incidential finding of low blood pressure (systolic 00'B).   At baseline, patient is supposed to be on 6 L of oxygen and 10 L with activity. He does have a history of aspiration. Previous modified barium swallow did show positive aspiration. Patient does have underlying Parkinson's disease. He does participate in pulmonary rehabilitation. He has been evaluated by hospice in the past but because of pulmonary rehabilitation was not felt to be a candidate. He was recently evaluated by a palliative care.    Due to increase in symptoms, he was directly admitted from the Pulmonary office for  further evaluation.  He was treated empirically with antibiotics (x48 hours) and steroids.  Nasal hygiene was instituted (he has been using 4-way at home).  Home BPH medications were held and he was given gentle IVF"s with improvement in blood pressure. CXR was negative for acute infiltrate. PCT was negative. Antibiotics were discontinued.  Home medications were restarted prior to discharge and patient tolerated well.  The patient was medically cleared for discharge 4/6 with plans as above.            ANTIBIOTICS Rocephin 4/4 >> 4/6  Azithromycin 4/4 >> 4/6   Discharge Exam: General: well developed elderly male in NAD HEENT: MM pink/moist Neuro: AAOx4, MAE, normal strength CV: s1s2 rrr, no m/r/g PULM: even/non-labored, lungs bilaterally without wheeze BC:WUGQ, non-tender, bsx4 active  Extremities: warm/dry, no edema  Skin: no rashes or lesions   Vitals:   05/12/16 0758 05/12/16 0800 05/12/16 1406 05/12/16 1419  BP:  127/74 138/74   Pulse:  81 94   Resp:  18 18   Temp:  97.5 F (36.4 C) 98.4 F (36.9 C)   TempSrc:  Oral Oral   SpO2: 97% 97% 94% 96%  Weight:      Height:  Discharge Labs  BMET  Recent Labs Lab 05/10/16 1405 05/11/16 0530 05/12/16 0618  NA 140 139 139  K 3.1* 4.4 4.6  CL 101 103 104  CO2 _0 GLUCOSE 98 143* 128*  BUN _1 CREATININE 0.69  0.67 0.68 0.65  CALCIUM 8.5* 8.4* 8.8*    CBC  Recent Labs Lab 05/10/16 1405 05/11/16 0530 05/12/16 0618  HGB 11.5*  11.7* 11.6* 11.1*  HCT 35.9*  36.2* 35.7* 35.2*  WBC 7.4  7.2 5.3 10.1  PLT 271  265 244 294    Anti-Coagulation No results for input(s): INR in the last 168 hours.  Discharge Instructions    Call MD for:  difficulty breathing, headache or visual disturbances    Complete by:  As directed    Call MD for:  extreme fatigue    Complete by:  As directed    Call MD for:  hives    Complete by:  As directed    Call MD for:  persistant dizziness or light-headedness     Complete by:  As directed    Call MD for:  persistant nausea and vomiting    Complete by:  As directed    Call MD for:  redness, tenderness, or signs of infection (pain, swelling, redness, odor or green/yellow discharge around incision site)    Complete by:  As directed    Call MD for:  severe uncontrolled pain    Complete by:  As directed    Call MD for:  temperature >100.4    Complete by:  As directed    Diet - low sodium heart healthy    Complete by:  As directed    Discharge instructions    Complete by:  As directed    1.  Review your medications carefully as they may have changed 2.  Complete your prednisone as prescribed  3.  Follow up with Dr. Lake Bells as arranged  4.  Continue to use your oxygen as previously ordered   Increase activity slowly    Complete by:  As directed        Follow-up Information    Simonne Maffucci, MD Follow up on 05/25/2016.   Specialty:  Pulmonary Disease Why:  Appt at [facility] information: [address]  [place] [address] [phone]            Allergies as of 05/12/2016      Reactions   Isosorbide Other (See Comments)   Reaction:  Headaches       Medication List    TAKE these medications   acetaminophen 500 MG tablet Commonly known as:  TYLENOL Take 500-1,000 mg by mouth every 6 (six) hours as needed for mild pain, moderate pain, fever or headache.   ADVAIR DISKUS 500-50 MCG/DOSE Aepb Generic drug:  Fluticasone-Salmeterol Inhale 1 puff into the lungs 2 (two) times daily.   atorvastatin 20 MG tablet Commonly known as:  LIPITOR Take 1 tablet (20 mg total) by mouth daily.   busPIRone 5 MG tablet Commonly known as:  BUSPAR Take 1 tablet (5 mg total) by mouth daily.   carbidopa-levodopa 25-100 MG tablet Commonly known as:  SINEMET IR Take 2 tablets by mouth 3 (three) times daily.   carbidopa-levodopa 50-200 MG tablet Commonly known as:  SINEMET CR Take 1 tablet by mouth at bedtime.   clopidogrel 75 MG  tablet Commonly known as:  PLAVIX Take 1 tablet (75 mg total) by mouth daily.   finasteride  5 MG tablet Commonly known as:  PROSCAR Take 1 tablet (5 mg total) by mouth every evening.   fluticasone 50 MCG/ACT nasal spray Commonly known as:  FLONASE Place 1 spray into both nostrils daily. Start taking on:  05/13/2016   INCRUSE ELLIPTA 62.5 MCG/INH Aepb Generic drug:  umeclidinium bromide Inhale 1 puff into the lungs daily.   levothyroxine 150 MCG tablet Commonly known as:  SYNTHROID, LEVOTHROID Take 1 tablet (150 mcg total) by mouth daily.   multivitamin with minerals Tabs tablet Take 1 tablet by mouth daily at 12 noon.   mupirocin ointment 2 % Commonly known as:  BACTROBAN Place 1 application into the nose 2 (two) times daily.   MYRBETRIQ 50 MG Tb24 tablet Generic drug:  mirabegron ER Take 50 mg by mouth every evening.   predniSONE 10 MG tablet Commonly known as:  DELTASONE 4 tabs for 2 days, then 3 tabs for 2 days, then 2 tabs for 2 days, then 1 tab for 2 days   sodium chloride 0.65 % Soln nasal spray Commonly known as:  OCEAN Place 1 spray into both nostrils as needed for congestion.   tamsulosin 0.4 MG Caps capsule Commonly known as:  FLOMAX Take 1 capsule (0.4 mg total) by mouth every evening.   traZODone 50 MG tablet Commonly known as:  DESYREL Take 1.5-2 tablets (75-100 mg total) by mouth at bedtime.        Disposition: Home / [employer].  No new home health needs identified at discharge.   Discharged Condition: Bradley Bennett has met maximum benefit of inpatient care and is medically stable and cleared for discharge.  Patient is pending follow up as above.      Time spent on disposition:  35 minutes.   Signed: Noe Gens, NP-C [facility] Pgr: [phone] Office: [phone]

## 2016-05-12 NOTE — Progress Notes (Signed)
Patient is from [employer].

## 2016-05-12 NOTE — Care Management Note (Signed)
Case Management Note  Patient Details  Name: Bradley Bennett MRN: [id number] Date of Birth: 1932-08-26  Subjective/Objective:83 y/o m admitted w/COPD. PT-no PT f/u.                    Action/Plan:d/c plan home.   Expected Discharge Date:   (unknown)               Expected Discharge Plan:  Home/Self Care  In-House Referral:  Clinical Social Work  Discharge planning Services  CM Consult  Post Acute Care Choice:    Choice offered to:     DME Arranged:    DME Agency:     HH Arranged:    HH Agency:     Status of Service:  In process, will continue to follow  If discussed at Long Length of Stay Meetings, dates discussed:    Additional Comments:  Dessa Phi, RN 05/12/2016, 2:51 PM

## 2016-05-14 NOTE — Progress Notes (Signed)
Reviewed, agree 

## 2016-05-16 ENCOUNTER — Encounter (HOSPITAL_COMMUNITY)
Admission: RE | Admit: 2016-05-16 | Discharge: 2016-05-16 | Disposition: A | Discharge status: Home / Self Care | Payer: Self-pay | Source: Ambulatory Visit | Attending: Pulmonary Disease | Admitting: Pulmonary Disease

## 2016-05-18 ENCOUNTER — Encounter (HOSPITAL_COMMUNITY): Payer: Self-pay

## 2016-05-23 ENCOUNTER — Encounter (HOSPITAL_COMMUNITY)
Admission: RE | Admit: 2016-05-23 | Discharge: 2016-05-23 | Disposition: A | Discharge status: Home / Self Care | Payer: Self-pay | Source: Ambulatory Visit | Attending: Pulmonary Disease | Admitting: Pulmonary Disease

## 2016-05-25 ENCOUNTER — Encounter (HOSPITAL_COMMUNITY)
Admission: RE | Admit: 2016-05-25 | Discharge: 2016-05-25 | Disposition: A | Discharge status: Home / Self Care | Payer: Self-pay | Source: Ambulatory Visit | Attending: Pulmonary Disease | Admitting: Pulmonary Disease

## 2016-05-25 ENCOUNTER — Encounter: Payer: Self-pay | Admitting: Pulmonary Disease

## 2016-05-25 ENCOUNTER — Ambulatory Visit (INDEPENDENT_AMBULATORY_CARE_PROVIDER_SITE_OTHER): Payer: Medicare Other | Admitting: Pulmonary Disease

## 2016-05-25 DIAGNOSIS — J8410 Pulmonary fibrosis, unspecified: Secondary | ICD-10-CM

## 2016-05-25 DIAGNOSIS — J301 Allergic rhinitis due to pollen: Secondary | ICD-10-CM | POA: Diagnosis not present

## 2016-05-25 DIAGNOSIS — I2510 Atherosclerotic heart disease of native coronary artery without angina pectoris: Secondary | ICD-10-CM | POA: Diagnosis not present

## 2016-05-25 DIAGNOSIS — J309 Allergic rhinitis, unspecified: Secondary | ICD-10-CM | POA: Insufficient documentation

## 2016-05-25 MED ORDER — UMECLIDINIUM BROMIDE 62.5 MCG/INH IN AEPB: 1.0000 | INHALATION_SPRAY | Freq: Every day | RESPIRATORY_TRACT | 1 refills | Status: DC

## 2016-05-25 MED ORDER — FLUTICASONE PROPIONATE 50 MCG/ACT NA SUSP: 1.0000 | Freq: Every day | NASAL | 1 refills | Status: DC

## 2016-05-25 MED ORDER — FLUTICASONE-SALMETEROL 500-50 MCG/DOSE IN AEPB: 1.0000 | INHALATION_SPRAY | Freq: Two times a day (BID) | RESPIRATORY_TRACT | 1 refills | Status: DC

## 2016-05-25 MED ORDER — TRAZODONE HCL 100 MG PO TABS: 100.0000 mg | ORAL_TABLET | Freq: Every day | ORAL | 1 refills | Status: DC

## 2016-05-25 NOTE — Patient Instructions (Signed)
Increase the dose of trazodone to 100 mg daily at bedtime  He taking [employer]ru[employer] and Advair  Keep using your oxygen as you are doing  Use Milta Deiters Med rinses with distilled water at least twice per day using the instructions on the package. 1/2 hour after using the [facility] Med rinse, use Nasacort two puffs in each nostril once per day.  Remember that the Nasacort can take 1-2 weeks to work after regular use. Use generic zyrtec (cetirizine) every day.  If this doesn't help, then stop taking it and use chlorpheniramine-phenylephrine combination tablets.  We will see you back in 2 months or sooner if needed

## 2016-05-25 NOTE — Assessment & Plan Note (Signed)
Severe disease but stable interval. Question recent exacerbation though it sounds as if symptoms may have been due primarily to sinus congestion preventing adequate oxygen flow.  Plan: Continue Advair Continue Incruise Continue pulmonary rehabilitation

## 2016-05-25 NOTE — Assessment & Plan Note (Signed)
Severe, does well with 7-8 Lpm at rest, 15 Lpm with exertion and an oxymizer at all times.

## 2016-05-25 NOTE — Progress Notes (Signed)
Subjective:    Patient ID: Bradley Bennett, male    DOB: 04-26-1932, 83 y.o.   MRN: [id number]  Synopsis: On her fibrosis and possible COPD with a history of chronic aspiration in the setting of Parkinson's disease. Uses 3-4 L of oxygen continuously.  He was started on oxygen around 2013 after his diagnosis of COPD in 2013.  He was diagnosed with an ILD around 46 in [place]. A modified barium swallow in 2015 showed aspiration.  He smoked 35 years, 2 ppd, quit around 1992.   HPI Chief Complaint  Patient presents with  . Follow-up    pt c/o sob laying down at night.  denies mucus production, chest pain.  notes occasional cough.     Bradley Bennett says that he is feeling much better.   He says that the flonase has really helped with his sinus congestion and itchiness.  He felt that for a week prior to his hospitalization he had his sinus scraped out by a surgeon and he developed sinus congestion and pain about a day later.  He started flonase however two weeks ago.  He thinks his sinus problem is a little better.  He states that sometimes the sinus problem still happens though which makes him anxious and can make him feel more dyspnea.    He says that he is able to get around [facility].  He pushes his oxygen up to 15 Lpm when he walks, then 7 Lpm.  He is going to pulmonary rehab 2 x per week.  Walking from the dining room to his apartment he is OK..  No wheezing or chest congestion.  He will rarely produce some mucus.    Past Medical History:  Diagnosis Date  . Arthritis   . Asthma   . Coronary artery disease   . Emphysema of lung ([place])   . Hearing difficulty of both ears   . Heart attack ([place])   . Heart murmur   . Hyperlipidemia   . Hypertension   . ILD (interstitial lung disease) ([place])   . Parkinson disease ([place])          Review of Systems  Constitutional: Negative for chills, fatigue and fever.  HENT: Positive for postnasal drip, rhinorrhea and sinus pressure. Negative for sinus  pain.   Respiratory: Positive for cough and shortness of breath. Negative for wheezing.   Cardiovascular: Negative for chest pain, palpitations and leg swelling.  Gastrointestinal: Negative for abdominal distention, constipation, diarrhea and nausea.  Musculoskeletal: Negative for arthralgias, gait problem, joint swelling and neck stiffness.  Neurological: Negative for seizures, speech difficulty, light-headedness, numbness and headaches.       Objective:   Physical Exam Vitals:   05/25/16 1607  BP: 132/74  Pulse: (!) 104  SpO2: 90%  Weight: 188 lb (85.3 kg)  Height: 5' 10.5" (1.791 m)   RA  Gen: chronically ill appearing HENT: OP clear, TM's clear, neck supple PULM: Crackles bases B, normal percussion CV: RRR, no mgr, trace edema GI: BS+, soft, nontender Derm: no cyanosis or rash Psyche: normal mood and affect    Records from his hospitalization earlier this month reviewed were he was treated with steroids for a COPD exacerbation. BMET    Component Value Date/Time   NA 139 05/12/2016 0618   K 4.6 05/12/2016 0618   CL 104 05/12/2016 0618   CO2 28 05/12/2016 0618   GLUCOSE 128 (H) 05/12/2016 0618   BUN 13 05/12/2016 0618   CREATININE 0.65 05/12/2016 0618  CALCIUM 8.8 (L) 05/12/2016 0618   GFRNONAA >60 05/12/2016 0618   GFRAA >60 05/12/2016 0618           Assessment & Plan:  Hypoxemia Severe, does well with 7-8 Lpm at rest, 15 Lpm with exertion and an oxymizer at all times.  COPD (chronic obstructive pulmonary disease) (HCC) Severe disease but stable interval. Question recent exacerbation though it sounds as if symptoms may have been due primarily to sinus congestion preventing adequate oxygen flow.  Plan: Continue Advair Continue [place] Continue pulmonary rehabilitation  Postinflammatory pulmonary fibrosis ([place]) Likely chronic aspiration related disease. He is not interested in taking antibiotics therapy which at this point would add very little  value.  Plan: Continue aspiration precautions  Allergic rhinitis Significant disease, making administration of oxygen flow worse.  Use Neil Med rinses with distilled water at least twice per day using the instructions on the package. 1/2 hour after using the [facility] Med rinse, use Nasacort two puffs in each nostril once per day.  Remember that the Nasacort can take 1-2 weeks to work after regular use. Use generic zyrtec (cetirizine) every day.  If this doesn't help, then stop taking it and use chlorpheniramine-phenylephrine combination tablets.      Current Outpatient Prescriptions:  .  acetaminophen (TYLENOL) 500 MG tablet, Take 500-1,000 mg by mouth every 6 (six) hours as needed for mild pain, moderate pain, fever or headache. , Disp: , Rfl:  .  atorvastatin (LIPITOR) 20 MG tablet, Take 1 tablet (20 mg total) by mouth daily., Disp: 90 tablet, Rfl: 3 .  busPIRone (BUSPAR) 5 MG tablet, Take 1 tablet (5 mg total) by mouth daily., Disp: 90 tablet, Rfl: 1 .  carbidopa-levodopa (SINEMET CR) 50-200 MG tablet, Take 1 tablet by mouth at bedtime., Disp: 90 tablet, Rfl: 1 .  carbidopa-levodopa (SINEMET IR) 25-100 MG tablet, Take 2 tablets by mouth 3 (three) times daily., Disp: 540 tablet, Rfl: 1 .  clopidogrel (PLAVIX) 75 MG tablet, Take 1 tablet (75 mg total) by mouth daily., Disp: 90 tablet, Rfl: 1 .  finasteride (PROSCAR) 5 MG tablet, Take 1 tablet (5 mg total) by mouth every evening., Disp: , Rfl:  .  fluticasone (FLONASE) 50 MCG/ACT nasal spray, Place 1 spray into both nostrils daily., Disp: 48 g, Rfl: 1 .  Fluticasone-Salmeterol (ADVAIR DISKUS) 500-50 MCG/DOSE AEPB, Inhale 1 puff into the lungs 2 (two) times daily., Disp: 180 each, Rfl: 1 .  levothyroxine (SYNTHROID, LEVOTHROID) 150 MCG tablet, Take 1 tablet (150 mcg total) by mouth daily., Disp: 90 tablet, Rfl: 1 .  mirabegron ER (MYRBETRIQ) 50 MG TB24 tablet, Take 50 mg by mouth every evening., Disp: , Rfl:  .  Multiple Vitamin (MULTIVITAMIN  WITH MINERALS) TABS tablet, Take 1 tablet by mouth daily at 12 noon., Disp: , Rfl:  .  mupirocin ointment (BACTROBAN) 2 %, Place 1 application into the nose 2 (two) times daily., Disp: , Rfl:  .  sodium chloride (OCEAN) 0.65 % SOLN nasal spray, Place 1 spray into both nostrils as needed for congestion., Disp: 1 Bottle, Rfl: 2 .  tamsulosin (FLOMAX) 0.4 MG CAPS capsule, Take 1 capsule (0.4 mg total) by mouth every evening., Disp: , Rfl:  .  umeclidinium bromide (INCRUSE ELLIPTA) 62.5 MCG/INH AEPB, Inhale 1 puff into the lungs daily., Disp: 90 each, Rfl: 1 .  traZODone (DESYREL) 100 MG tablet, Take 1 tablet (100 mg total) by mouth at bedtime., Disp: 90 tablet, Rfl: 1

## 2016-05-25 NOTE — Assessment & Plan Note (Signed)
Significant disease, making administration of oxygen flow worse.  Use Neil Med rinses with distilled water at least twice per day using the instructions on the package. 1/2 hour after using the [facility] Med rinse, use Nasacort two puffs in each nostril once per day.  Remember that the Nasacort can take 1-2 weeks to work after regular use. Use generic zyrtec (cetirizine) every day.  If this doesn't help, then stop taking it and use chlorpheniramine-phenylephrine combination tablets.

## 2016-05-25 NOTE — Assessment & Plan Note (Signed)
Likely chronic aspiration related disease. He is not interested in taking antibiotics therapy which at this point would add very little value.  Plan: Continue aspiration precautions

## 2016-05-30 ENCOUNTER — Encounter (HOSPITAL_COMMUNITY): Payer: Self-pay

## 2016-06-01 ENCOUNTER — Encounter (HOSPITAL_COMMUNITY)
Admission: RE | Admit: 2016-06-01 | Discharge: 2016-06-01 | Disposition: A | Discharge status: Home / Self Care | Payer: Self-pay | Source: Ambulatory Visit | Attending: Pulmonary Disease | Admitting: Pulmonary Disease

## 2016-06-06 ENCOUNTER — Encounter (HOSPITAL_COMMUNITY)
Admission: RE | Admit: 2016-06-06 | Discharge: 2016-06-06 | Disposition: A | Discharge status: Home / Self Care | Payer: Self-pay | Source: Ambulatory Visit | Attending: Pulmonary Disease | Admitting: Pulmonary Disease

## 2016-06-06 DIAGNOSIS — J8410 Pulmonary fibrosis, unspecified: Secondary | ICD-10-CM | POA: Insufficient documentation

## 2016-06-08 ENCOUNTER — Encounter (HOSPITAL_COMMUNITY)
Admission: RE | Admit: 2016-06-08 | Discharge: 2016-06-08 | Disposition: A | Discharge status: Home / Self Care | Payer: Self-pay | Source: Ambulatory Visit | Attending: Pulmonary Disease | Admitting: Pulmonary Disease

## 2016-06-13 ENCOUNTER — Encounter (HOSPITAL_COMMUNITY): Payer: Medicare Other

## 2016-06-13 ENCOUNTER — Telehealth (HOSPITAL_COMMUNITY): Payer: Self-pay | Admitting: Internal Medicine

## 2016-06-13 NOTE — Telephone Encounter (Signed)
Pt called to cancel didn't sleep well last night... Bradley Bennett

## 2016-06-15 ENCOUNTER — Encounter (HOSPITAL_COMMUNITY)
Admission: RE | Admit: 2016-06-15 | Discharge: 2016-06-15 | Disposition: A | Discharge status: Home / Self Care | Payer: Self-pay | Source: Ambulatory Visit | Attending: Pulmonary Disease | Admitting: Pulmonary Disease

## 2016-06-15 ENCOUNTER — Ambulatory Visit (INDEPENDENT_AMBULATORY_CARE_PROVIDER_SITE_OTHER): Payer: Medicare Other | Admitting: Pulmonary Disease

## 2016-06-15 ENCOUNTER — Ambulatory Visit (INDEPENDENT_AMBULATORY_CARE_PROVIDER_SITE_OTHER)
Admission: RE | Admit: 2016-06-15 | Discharge: 2016-06-15 | Disposition: A | Discharge status: Home / Self Care | Payer: Medicare Other | Source: Ambulatory Visit | Attending: Pulmonary Disease | Admitting: Pulmonary Disease

## 2016-06-15 ENCOUNTER — Encounter: Payer: Self-pay | Admitting: Pulmonary Disease

## 2016-06-15 VITALS — BP 136/78 | HR 91 | Temp 97.9°F | Ht 70.5 in | Wt 188.0 lb

## 2016-06-15 DIAGNOSIS — I2510 Atherosclerotic heart disease of native coronary artery without angina pectoris: Secondary | ICD-10-CM | POA: Diagnosis not present

## 2016-06-15 DIAGNOSIS — R0609 Other forms of dyspnea: Secondary | ICD-10-CM | POA: Diagnosis not present

## 2016-06-15 DIAGNOSIS — R0602 Shortness of breath: Secondary | ICD-10-CM | POA: Diagnosis not present

## 2016-06-15 DIAGNOSIS — J8410 Pulmonary fibrosis, unspecified: Secondary | ICD-10-CM

## 2016-06-15 DIAGNOSIS — R0600 Dyspnea, unspecified: Secondary | ICD-10-CM

## 2016-06-15 DIAGNOSIS — R05 Cough: Secondary | ICD-10-CM | POA: Diagnosis not present

## 2016-06-15 DIAGNOSIS — R0902 Hypoxemia: Secondary | ICD-10-CM | POA: Diagnosis not present

## 2016-06-15 MED ORDER — DOXYCYCLINE HYCLATE 100 MG PO TABS: 100.0000 mg | ORAL_TABLET | Freq: Two times a day (BID) | ORAL | 0 refills | Status: DC

## 2016-06-15 NOTE — Progress Notes (Signed)
Subjective:    Patient ID: Bradley Bennett, male    DOB: 05/21/1932, 83 y.o.   MRN: [id number]  Synopsis: On her fibrosis and possible COPD with a history of chronic aspiration in the setting of Parkinson's disease. Uses 3-4 L of oxygen continuously.  He was started on oxygen around 2013 after his diagnosis of COPD in 2013.  He was diagnosed with an ILD around 60 in [place]. A modified barium swallow in 2015 showed aspiration.  He smoked 35 years, 2 ppd, quit around 1992.   HPI Chief Complaint  Patient presents with  . Acute Visit    c/o sinus congestion, PND, prod cough with clear/white/yellow mucus, fatigue Xseveral days.    Bradley Bennett says that when he left rehab today and went outside he had a bad sneezing episode and itching in his eyes.  In the last few days he has been hoarse, had a lot chest congestion.  No fever or chills but he has an increased cough.  He says that his girlfriend has bronchits.  He is coughing more, but not bringing up more mucus than normal.    He continues to use and benefit from his oxygen: he uses 9.5-10Lpm at rest. He can't use more than this because his machine doesn't go any higher.  No zyrtec or benadryl use.  He has been using his [facility]ta Deiters med rinse once per day     Past Medical History:  Diagnosis Date  . Arthritis   . Asthma   . Coronary artery disease   . Emphysema of lung ([place])   . Hearing difficulty of both ears   . Heart attack ([place])   . Heart murmur   . Hyperlipidemia   . Hypertension   . ILD (interstitial lung disease) ([place])   . Parkinson disease ([place])          Review of Systems  Constitutional: Positive for fatigue. Negative for chills and fever.  HENT: Positive for postnasal drip, rhinorrhea and sinus pressure. Negative for sinus pain.   Respiratory: Positive for cough and shortness of breath. Negative for wheezing.   Cardiovascular: Negative for chest pain, palpitations and leg swelling.  Gastrointestinal: Negative for  abdominal distention, constipation, diarrhea and nausea.  Musculoskeletal: Negative for arthralgias, gait problem, joint swelling and neck stiffness.  Neurological: Negative for seizures, speech difficulty, light-headedness, numbness and headaches.       Objective:   Physical Exam Vitals:   06/15/16 1413  BP: 136/78  Pulse: 91  Temp: 97.9 F (36.6 C)  TempSrc: Oral  SpO2: 94%  Weight: 188 lb (85.3 kg)  Height: 5' 10.5" (1.791 m)   RA   Gen: chronically ill appearing HENT: OP clear, TM's clear, neck supple PULM: Crackles basese B, normal percussion CV: RRR, no mgr, trace edema GI: BS+, soft, nontender Derm: no cyanosis or rash Psyche: normal mood and affect     BMET    Component Value Date/Time   NA 139 05/12/2016 0618   K 4.6 05/12/2016 0618   CL 104 05/12/2016 0618   CO2 28 05/12/2016 0618   GLUCOSE 128 (H) 05/12/2016 0618   BUN 13 05/12/2016 0618   CREATININE 0.65 05/12/2016 0618   CALCIUM 8.8 (L) 05/12/2016 0618   GFRNONAA >60 05/12/2016 0618   GFRAA >60 05/12/2016 0618           Assessment & Plan:  Postinflammatory pulmonary fibrosis (HCC) Due to aspiration most likely.  Hypoxemia Chronic severe hypoxemia, continue 10 L oxygen at rest,  15 with exercise  Allergic rhinitis Acutely worse in the last 24 hours. He has been using his saline rinses once a day and fluticasone appropriately. He would benefit from  Dyspnea He has acutely worsening shortness of breath in the setting of a recent sick exposure and worsening allergic rhinitis. Hopefully this is just severe allergic rhinitis causing obstruction of his nasal airflow and increasing cough. However, because of his severe chronic lung disease we need to get a chest x-ray to ensure there is no evidence of pneumonia (fortunately he has no signs or symptoms to suggest this right now).   I have advised that he start taking cetirizine, increased saline rinses and continue fluticasone. If he's not feeling  better by tomorrow I want him to start an antibiotic, I gave him a prescription for doxycycline. If he does not improve then he needs to go straight to the emergency room Chest x-ray ordered.    Current Outpatient Prescriptions:  .  acetaminophen (TYLENOL) 500 MG tablet, Take 500-1,000 mg by mouth every 6 (six) hours as needed for mild pain, moderate pain, fever or headache. , Disp: , Rfl:  .  atorvastatin (LIPITOR) 20 MG tablet, Take 1 tablet (20 mg total) by mouth daily., Disp: 90 tablet, Rfl: 3 .  busPIRone (BUSPAR) 5 MG tablet, Take 1 tablet (5 mg total) by mouth daily., Disp: 90 tablet, Rfl: 1 .  carbidopa-levodopa (SINEMET CR) 50-200 MG tablet, Take 1 tablet by mouth at bedtime., Disp: 90 tablet, Rfl: 1 .  carbidopa-levodopa (SINEMET IR) 25-100 MG tablet, Take 2 tablets by mouth 3 (three) times daily., Disp: 540 tablet, Rfl: 1 .  clopidogrel (PLAVIX) 75 MG tablet, Take 1 tablet (75 mg total) by mouth daily., Disp: 90 tablet, Rfl: 1 .  finasteride (PROSCAR) 5 MG tablet, Take 1 tablet (5 mg total) by mouth every evening., Disp: , Rfl:  .  fluticasone (FLONASE) 50 MCG/ACT nasal spray, Place 1 spray into both nostrils daily., Disp: 48 g, Rfl: 1 .  Fluticasone-Salmeterol (ADVAIR DISKUS) 500-50 MCG/DOSE AEPB, Inhale 1 puff into the lungs 2 (two) times daily., Disp: 180 each, Rfl: 1 .  levothyroxine (SYNTHROID, LEVOTHROID) 150 MCG tablet, Take 1 tablet (150 mcg total) by mouth daily., Disp: 90 tablet, Rfl: 1 .  mirabegron ER (MYRBETRIQ) 50 MG TB24 tablet, Take 50 mg by mouth every evening., Disp: , Rfl:  .  Multiple Vitamin (MULTIVITAMIN WITH MINERALS) TABS tablet, Take 1 tablet by mouth daily at 12 noon., Disp: , Rfl:  .  mupirocin ointment (BACTROBAN) 2 %, Place 1 application into the nose 2 (two) times daily., Disp: , Rfl:  .  sodium chloride (OCEAN) 0.65 % SOLN nasal spray, Place 1 spray into both nostrils as needed for congestion., Disp: 1 Bottle, Rfl: 2 .  tamsulosin (FLOMAX) 0.4 MG CAPS  capsule, Take 1 capsule (0.4 mg total) by mouth every evening., Disp: , Rfl:  .  traZODone (DESYREL) 100 MG tablet, Take 1 tablet (100 mg total) by mouth at bedtime., Disp: 90 tablet, Rfl: 1 .  umeclidinium bromide (INCRUSE ELLIPTA) 62.5 MCG/INH AEPB, Inhale 1 puff into the lungs daily., Disp: 90 each, Rfl: 1 .  doxycycline (VIBRA-TABS) 100 MG tablet, Take 1 tablet (100 mg total) by mouth 2 (two) times daily., Disp: 10 tablet, Rfl: 0

## 2016-06-15 NOTE — Assessment & Plan Note (Signed)
Due to aspiration most likely.

## 2016-06-15 NOTE — Assessment & Plan Note (Signed)
Acutely worse in the last 24 hours. He has been using his saline rinses once a day and fluticasone appropriately. He would benefit from

## 2016-06-15 NOTE — Assessment & Plan Note (Signed)
Chronic severe hypoxemia, continue 10 L oxygen at rest, 15 with exercise

## 2016-06-15 NOTE — Patient Instructions (Signed)
Use Neil med rinses 2-3 times a day Keep taking Flonase as you're doing, though change the angle that she spray at as we discussed in clinic today Take generic Zyrtec (cetirizine) once daily If you're not feeling any better tomorrow then take the antibiotic we gave you today If at any point you feel worse history to the hospital We will see you back as previously scheduled

## 2016-06-15 NOTE — Assessment & Plan Note (Signed)
He has acutely worsening shortness of breath in the setting of a recent sick exposure and worsening allergic rhinitis. Hopefully this is just severe allergic rhinitis causing obstruction of his nasal airflow and increasing cough. However, because of his severe chronic lung disease we need to get a chest x-ray to ensure there is no evidence of pneumonia (fortunately he has no signs or symptoms to suggest this right now).   I have advised that he start taking cetirizine, increased saline rinses and continue fluticasone. If he's not feeling better by tomorrow I want him to start an antibiotic, I gave him a prescription for doxycycline. If he does not improve then he needs to go straight to the emergency room Chest x-ray ordered.

## 2016-06-20 ENCOUNTER — Encounter (HOSPITAL_COMMUNITY)
Admission: RE | Admit: 2016-06-20 | Discharge: 2016-06-20 | Disposition: A | Discharge status: Home / Self Care | Payer: Self-pay | Source: Ambulatory Visit | Attending: Pulmonary Disease | Admitting: Pulmonary Disease

## 2016-06-22 ENCOUNTER — Encounter (HOSPITAL_COMMUNITY)
Admission: RE | Admit: 2016-06-22 | Discharge: 2016-06-22 | Disposition: A | Discharge status: Home / Self Care | Payer: Self-pay | Source: Ambulatory Visit | Attending: Pulmonary Disease | Admitting: Pulmonary Disease

## 2016-06-27 ENCOUNTER — Encounter (HOSPITAL_COMMUNITY)
Admission: RE | Admit: 2016-06-27 | Discharge: 2016-06-27 | Disposition: A | Discharge status: Home / Self Care | Payer: Self-pay | Source: Ambulatory Visit | Attending: Pulmonary Disease | Admitting: Pulmonary Disease

## 2016-06-29 ENCOUNTER — Encounter (HOSPITAL_COMMUNITY): Payer: Self-pay

## 2016-07-04 ENCOUNTER — Encounter (HOSPITAL_COMMUNITY)
Admission: RE | Admit: 2016-07-04 | Discharge: 2016-07-04 | Disposition: A | Discharge status: Home / Self Care | Payer: Self-pay | Source: Ambulatory Visit | Attending: Pulmonary Disease | Admitting: Pulmonary Disease

## 2016-07-06 ENCOUNTER — Encounter (HOSPITAL_COMMUNITY): Payer: Self-pay

## 2016-07-11 ENCOUNTER — Encounter (HOSPITAL_COMMUNITY)
Admission: RE | Admit: 2016-07-11 | Discharge: 2016-07-11 | Disposition: A | Discharge status: Home / Self Care | Payer: Self-pay | Source: Ambulatory Visit | Attending: Pulmonary Disease | Admitting: Pulmonary Disease

## 2016-07-11 DIAGNOSIS — J8410 Pulmonary fibrosis, unspecified: Secondary | ICD-10-CM | POA: Insufficient documentation

## 2016-07-13 ENCOUNTER — Encounter (HOSPITAL_COMMUNITY)
Admission: RE | Admit: 2016-07-13 | Discharge: 2016-07-13 | Disposition: A | Discharge status: Home / Self Care | Payer: Self-pay | Source: Ambulatory Visit | Attending: Pulmonary Disease | Admitting: Pulmonary Disease

## 2016-07-13 DIAGNOSIS — R7303 Prediabetes: Secondary | ICD-10-CM | POA: Insufficient documentation

## 2016-07-13 NOTE — Progress Notes (Signed)
Subjective:    Patient ID: Bradley Bennett, male    DOB: 11/25/1932, 83 y.o.   MRN: [id number]  HPI The patient is here for follow up.   Hypothyroidism:  He is taking his medication daily.  He denies any recent changes in energy or weight that are unexplained.   Hyperlipidemia: He is taking his medication daily. He is compliant with a low fat/cholesterol diet. He is walking a little and doing pulmonary rehab, but his exercise is limited by his SOB.  He denies myalgias.   Prediabetes:  He is compliant with a low sugar/carbohydrate diet.  He is exercising minimally.  Insomnia:  He takes trazodone and melatonin at night.  He still does not sleep well.  His mind races.   Anxiety: He is taking his medication daily as prescribed. He denies any side effects from the medication. He feels anxious at times.   Chronic respir failure:  He is needing more oxygen -- uses 9 L / min at rest.  With walking he uses 15 L and it goes down to 75%. He is currently resting here on 6L and his oxygen was high 90's. He has a follow up with pulmonary.   Diarrhea:  He started having dirrhea a couple of months ago.  He denies recent antibiotics, changes in medications or diet.  He uses imodium daily.   The diarrhea is controlled with imodium.   His BP goes down to 98/65 at home. He denies any lightheadedness or dizziness.  He feels he is drinking plenty of fluids.     Medications and allergies reviewed with patient and updated if appropriate.  Patient Active Problem List   Diagnosis Date Noted  . Prediabetes 07/13/2016  . Dyspnea 06/15/2016  . Allergic rhinitis 05/25/2016  . COPD exacerbation ([place]) 05/10/2016  . Thrush 01/15/2016  . Trigger thumb of both hands 01/15/2016  . COPD (chronic obstructive pulmonary disease) ([place]) 07/16/2015  . Hypoxemia 07/16/2015  . Postinflammatory pulmonary fibrosis ([place]) 07/16/2015  . Parkinson disease ([place]) 07/16/2015  . Hypothyroidism 07/16/2015  . Insomnia  07/16/2015  . BPH (benign prostatic hypertrophy) 07/16/2015  . Anxiety 07/16/2015  . Carpal tunnel syndrome, right 07/16/2015  . Chronic lower back pain 07/16/2015  . Hyperglycemia 07/16/2015  . Macular degeneration 07/16/2015  . CAD in native artery 06/15/2015  . 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 S/P coronary artery stent placement 06/15/2015  . Hyperlipidemia 06/15/2015    Current Outpatient Prescriptions on File Prior to Visit  Medication Sig Dispense Refill  . acetaminophen (TYLENOL) 500 MG tablet Take 500-1,000 mg by mouth every 6 (six) hours as needed for mild pain, moderate pain, fever or headache.     Marland Kitchen atorvastatin (LIPITOR) 20 MG tablet Take 1 tablet (20 mg total) by mouth daily. 90 tablet 3  . busPIRone (BUSPAR) 5 MG tablet Take 1 tablet (5 mg total) by mouth daily. 90 tablet 1  . carbidopa-levodopa (SINEMET CR) 50-200 MG tablet Take 1 tablet by mouth at bedtime. 90 tablet 1  . carbidopa-levodopa (SINEMET IR) 25-100 MG tablet Take 2 tablets by mouth 3 (three) times daily. 540 tablet 1  . clopidogrel (PLAVIX) 75 MG tablet Take 1 tablet (75 mg total) by mouth daily. 90 tablet 1  . finasteride (PROSCAR) 5 MG tablet Take 1 tablet (5 mg total) by mouth every evening.    . fluticasone (FLONASE) 50 MCG/ACT nasal spray Place 1 spray into both nostrils daily. 48 g 1  . Fluticasone-Salmeterol (ADVAIR DISKUS)  500-50 MCG/DOSE AEPB Inhale 1 puff into the lungs 2 (two) times daily. 180 each 1  . levothyroxine (SYNTHROID, LEVOTHROID) 150 MCG tablet Take 1 tablet (150 mcg total) by mouth daily. 90 tablet 1  . mirabegron ER (MYRBETRIQ) 50 MG TB24 tablet Take 50 mg by mouth every evening.    . Multiple Vitamin (MULTIVITAMIN WITH MINERALS) TABS tablet Take 1 tablet by mouth daily at 12 noon.    . mupirocin ointment (BACTROBAN) 2 % Place 1 application into the nose 2 (two) times daily.    . sodium chloride (OCEAN) 0.65 % SOLN nasal spray Place 1 spray into both nostrils as needed for congestion. 1 Bottle  2  . tamsulosin (FLOMAX) 0.4 MG CAPS capsule Take 1 capsule (0.4 mg total) by mouth every evening.    . traZODone (DESYREL) 100 MG tablet Take 1 tablet (100 mg total) by mouth at bedtime. 90 tablet 1  . umeclidinium bromide (INCRUSE ELLIPTA) 62.5 MCG/INH AEPB Inhale 1 puff into the lungs daily. 90 each 1   No current facility-administered medications on file prior to visit.     Past Medical History:  Diagnosis Date  . Arthritis   . Asthma   . Coronary artery disease   . Emphysema of lung ([place])   . Hearing difficulty of both ears   . Heart attack ([place])   . Heart murmur   . Hyperlipidemia   . Hypertension   . ILD (interstitial lung disease) ([place])   . Parkinson disease [facility])     Past Surgical History:  Procedure Laterality Date  . CARPAL TUNNEL RELEASE Right 03/2014  . CORONARY ANGIOPLASTY WITH STENT PLACEMENT    . CORONARY ARTERY BYPASS GRAFT    . HERNIA REPAIR  10/2013   x3   . VEIN BYPASS SURGERY      Social History   Social History  . Marital status: Divorced    Spouse name: N/A  . Number of children: N/A  . Years of education: N/A   Occupational History  . retired     [place] History Main Topics  . Smoking status: Former Smoker    Packs/day: 2.00    Years: 35.00    Quit date: 08/15/1985  . Smokeless tobacco: Never Used     Comment: quit smoking in 1992  . Alcohol use 0.0 oz/week     Comment: twice every 6 months  . Drug use: No  . Sexual activity: No   Other Topics Concern  . None   Social History Narrative  . None    Family History  Problem Relation Age of Onset  . Colon cancer Father   . Stroke Father   . Arthritis Mother   . Arthritis Sister   . Heart disease Brother   . Kidney cancer Brother     Review of Systems  Constitutional: Negative for appetite change, chills and fever.  Respiratory: Positive for shortness of breath and wheezing. Negative for cough.   Cardiovascular: Negative for chest pain, palpitations and leg swelling.    Gastrointestinal: Positive for abdominal pain (occasional) and diarrhea.  Neurological: Negative for light-headedness and headaches.       Objective:   Vitals:   07/14/16 1613  BP: 132/70  Pulse: 86  Resp: 16  Temp: 97.8 F (36.6 C)   Wt Readings from Last 3 Encounters:  07/14/16 189 lb (85.7 kg)  06/15/16 188 lb (85.3 kg)  05/25/16 188 lb (85.3 kg)   Body mass index is 26.74  kg/m.   Physical Exam    Constitutional: Appears well-developed and well-nourished. No distress.  HENT:  Head: Normocephalic and atraumatic.  Neck: Neck supple. No tracheal deviation present. No thyromegaly present.  No cervical lymphadenopathy Cardiovascular: Normal rate, regular rhythm and normal heart sounds.   No murmur heard. No carotid bruit .  No edema Pulmonary/Chest: Effort normal and breath sounds normal. No respiratory distress. No has no wheezes. No rales.  Skin: Skin is warm and dry. Not diaphoretic.  Psychiatric: Normal mood and affect. Behavior is normal.      Assessment & Plan:    See Problem List for Assessment and Plan of chronic medical problems.

## 2016-07-14 ENCOUNTER — Encounter: Payer: Self-pay | Admitting: Internal Medicine

## 2016-07-14 ENCOUNTER — Ambulatory Visit (INDEPENDENT_AMBULATORY_CARE_PROVIDER_SITE_OTHER): Payer: Medicare Other | Admitting: Internal Medicine

## 2016-07-14 ENCOUNTER — Other Ambulatory Visit (INDEPENDENT_AMBULATORY_CARE_PROVIDER_SITE_OTHER): Payer: Medicare Other

## 2016-07-14 VITALS — BP 132/70 | HR 86 | Temp 97.8°F | Resp 16 | Wt 189.0 lb

## 2016-07-14 DIAGNOSIS — R7303 Prediabetes: Secondary | ICD-10-CM

## 2016-07-14 DIAGNOSIS — I2510 Atherosclerotic heart disease of native coronary artery without angina pectoris: Secondary | ICD-10-CM

## 2016-07-14 DIAGNOSIS — E038 Other specified hypothyroidism: Secondary | ICD-10-CM

## 2016-07-14 DIAGNOSIS — I959 Hypotension, unspecified: Secondary | ICD-10-CM

## 2016-07-14 DIAGNOSIS — G4700 Insomnia, unspecified: Secondary | ICD-10-CM | POA: Diagnosis not present

## 2016-07-14 DIAGNOSIS — R197 Diarrhea, unspecified: Secondary | ICD-10-CM | POA: Diagnosis not present

## 2016-07-14 DIAGNOSIS — F419 Anxiety disorder, unspecified: Secondary | ICD-10-CM

## 2016-07-14 LAB — COMPREHENSIVE METABOLIC PANEL
ALBUMIN: 4 g/dL (ref 3.5–5.2)
ALK PHOS: 87 U/L (ref 39–117)
ALT: 8 U/L (ref 0–53)
AST: 16 U/L (ref 0–37)
BUN: 20 mg/dL (ref 6–23)
CALCIUM: 9.3 mg/dL (ref 8.4–10.5)
CO2: 28 mEq/L (ref 19–32)
Chloride: 102 mEq/L (ref 96–112)
Creatinine, Ser: 0.76 mg/dL (ref 0.40–1.50)
GFR: 104.33 mL/min (ref >60.00)
GLUCOSE: 101 mg/dL — AB (ref 70–99)
POTASSIUM: 3.9 meq/L (ref 3.5–5.1)
Sodium: 137 mEq/L (ref 135–145)
Total Bilirubin: 0.4 mg/dL (ref 0.2–1.2)
Total Protein: 7 g/dL (ref 6.0–8.3)

## 2016-07-14 LAB — HEMOGLOBIN A1C: Hgb A1c MFr Bld: 6 % (ref 4.6–6.5)

## 2016-07-14 LAB — CBC WITH DIFFERENTIAL/PLATELET
BASOS PCT: 1 % (ref 0.0–3.0)
Basophils Absolute: 0.1 10*3/uL (ref 0.0–0.1)
Eosinophils Absolute: 0.4 10*3/uL (ref 0.0–0.7)
Eosinophils Relative: 5.3 % — ABNORMAL HIGH (ref 0.0–5.0)
HEMATOCRIT: 41.9 % (ref 39.0–52.0)
HEMOGLOBIN: 13.5 g/dL (ref 13.0–17.0)
LYMPHS PCT: 20.1 % (ref 12.0–46.0)
Lymphs Abs: 1.4 10*3/uL (ref 0.7–4.0)
MCHC: 32.2 g/dL (ref 30.0–36.0)
MCV: 88.6 fl (ref 78.0–100.0)
MONOS PCT: 9.6 % (ref 3.0–12.0)
Monocytes Absolute: 0.7 10*3/uL (ref 0.1–1.0)
NEUTROS ABS: 4.5 10*3/uL (ref 1.4–7.7)
Neutrophils Relative %: 64 % (ref 43.0–77.0)
PLATELETS: 235 10*3/uL (ref 150.0–400.0)
RBC: 4.74 Mil/uL (ref 4.22–5.81)
RDW: 17.5 % — AB (ref 11.5–15.5)
WBC: 7.1 10*3/uL (ref 4.0–10.5)

## 2016-07-14 MED ORDER — BUSPIRONE HCL 5 MG PO TABS: 5.0000 mg | ORAL_TABLET | Freq: Two times a day (BID) | ORAL | 1 refills | Status: DC

## 2016-07-14 NOTE — Patient Instructions (Signed)
Change buspar to twice daily.    Take imodium as needed.  Start taking a supplement fiber.    Take to Dr Sherrin Daisy about your oxygen levels.    Have blood work done today.

## 2016-07-15 DIAGNOSIS — I959 Hypotension, unspecified: Secondary | ICD-10-CM | POA: Insufficient documentation

## 2016-07-15 NOTE — Assessment & Plan Note (Signed)
Started two months ago - no obvious cause Taking imodium and as a result only has diarrhea about once a week - as long as he takes imodium No blood or fever.  No abd pain Try increasing fiber - fiber supplement Continue imodium Can refer to GI if needed

## 2016-07-15 NOTE — Assessment & Plan Note (Signed)
Check a1c 

## 2016-07-15 NOTE — Assessment & Plan Note (Signed)
Minimal - 98/65 at home Asymptomatic Avoid dehydration Monitor

## 2016-07-15 NOTE — Assessment & Plan Note (Signed)
Not ideally controlled It does affect his sleep Increase buspar to twice daily

## 2016-07-15 NOTE — Assessment & Plan Note (Signed)
Not sleeping well Continue trazodone Add buspar to evening/night to see if that helps Continue melatonin

## 2016-07-15 NOTE — Assessment & Plan Note (Signed)
Check tsh  Titrate med dose if needed  

## 2016-07-17 ENCOUNTER — Encounter: Payer: Self-pay | Admitting: Internal Medicine

## 2016-07-17 LAB — TSH: TSH: 2.88 u[IU]/mL (ref 0.35–4.50)

## 2016-07-18 ENCOUNTER — Encounter (HOSPITAL_COMMUNITY): Payer: Self-pay

## 2016-07-20 ENCOUNTER — Encounter (HOSPITAL_COMMUNITY): Payer: Self-pay

## 2016-07-25 ENCOUNTER — Ambulatory Visit: Payer: Medicare Other | Admitting: Pulmonary Disease

## 2016-07-25 ENCOUNTER — Encounter (HOSPITAL_COMMUNITY)
Admission: RE | Admit: 2016-07-25 | Discharge: 2016-07-25 | Disposition: A | Discharge status: Home / Self Care | Payer: Self-pay | Source: Ambulatory Visit | Attending: Pulmonary Disease | Admitting: Pulmonary Disease

## 2016-07-27 ENCOUNTER — Encounter (HOSPITAL_COMMUNITY)
Admission: RE | Admit: 2016-07-27 | Discharge: 2016-07-27 | Disposition: A | Discharge status: Home / Self Care | Payer: Self-pay | Source: Ambulatory Visit | Attending: Pulmonary Disease | Admitting: Pulmonary Disease

## 2016-07-28 ENCOUNTER — Ambulatory Visit (INDEPENDENT_AMBULATORY_CARE_PROVIDER_SITE_OTHER): Payer: Medicare Other | Admitting: Pulmonary Disease

## 2016-07-28 ENCOUNTER — Encounter: Payer: Self-pay | Admitting: Pulmonary Disease

## 2016-07-28 VITALS — BP 114/58 | HR 86 | Ht 70.5 in | Wt 190.2 lb

## 2016-07-28 DIAGNOSIS — J8410 Pulmonary fibrosis, unspecified: Secondary | ICD-10-CM

## 2016-07-28 DIAGNOSIS — I2510 Atherosclerotic heart disease of native coronary artery without angina pectoris: Secondary | ICD-10-CM

## 2016-07-28 DIAGNOSIS — J449 Chronic obstructive pulmonary disease, unspecified: Secondary | ICD-10-CM

## 2016-07-28 DIAGNOSIS — J301 Allergic rhinitis due to pollen: Secondary | ICD-10-CM

## 2016-07-28 DIAGNOSIS — R3915 Urgency of urination: Secondary | ICD-10-CM | POA: Diagnosis not present

## 2016-07-28 DIAGNOSIS — N401 Enlarged prostate with lower urinary tract symptoms: Secondary | ICD-10-CM | POA: Diagnosis not present

## 2016-07-28 DIAGNOSIS — R0600 Dyspnea, unspecified: Secondary | ICD-10-CM

## 2016-07-28 DIAGNOSIS — R0902 Hypoxemia: Secondary | ICD-10-CM | POA: Diagnosis not present

## 2016-07-28 NOTE — Progress Notes (Signed)
Subjective:    Patient ID: Bradley Bennett, male    DOB: 1932-02-12, 83 y.o.   MRN: [id number]  Synopsis: On her fibrosis and possible COPD with a history of chronic aspiration in the setting of Parkinson's disease. Uses 3-4 L of oxygen continuously.  He was started on oxygen around 2013 after his diagnosis of COPD in 2013.  He was diagnosed with an ILD around 29 in [place]. A modified barium swallow in 2015 showed aspiration.  He smoked 35 years, 2 ppd, quit around 1992.   HPI Chief Complaint  Patient presents with  . Follow-up    Pt states he has been coughing alittle bit more with clear mucus, has been having increase sob wit exertion,still some sinus drainage  Denies wheezing, chest tightness    Geoffrey says says that his sinuses still bother him a little, but the zyrtec and the saline spray really helped.  He has been using them regularly which.    He says that his breathing is OK, but his daughter says that she thinks that he is having a hard time breathing.  She thinks its worse.  He is coughing up clear mucus.  He doesn't feel chest congestion.    He continues to struggle with keeping up with his oxygen needs.  He wants to be able to travel, so [facility]  He is still participating in pulmonary rehab.     Past Medical History:  Diagnosis Date  . Arthritis   . Asthma   . Coronary artery disease   . Emphysema of lung ([place])   . Hearing difficulty of both ears   . Heart attack ([place])   . Heart murmur   . Hyperlipidemia   . Hypertension   . ILD (interstitial lung disease) ([place])   . Parkinson disease ([place])          Review of Systems  Constitutional: Positive for fatigue. Negative for chills and fever.  HENT: Positive for postnasal drip, rhinorrhea and sinus pressure. Negative for sinus pain.   Respiratory: Positive for cough and shortness of breath. Negative for wheezing.   Cardiovascular: Negative for chest pain, palpitations  and leg swelling.  Gastrointestinal: Negative for abdominal distention, constipation, diarrhea and nausea.  Musculoskeletal: Negative for arthralgias, gait problem, joint swelling and neck stiffness.  Neurological: Negative for seizures, speech difficulty, light-headedness, numbness and headaches.       Objective:   Physical Exam Vitals:   07/28/16 1223  BP: (!) 114/58  Pulse: 86  SpO2: 90%  Weight: 190 lb 3.2 oz (86.3 kg)  Height: 5' 10.5" (1.791 m)   10 L through [employer]: chronically ill appearing HENT: OP clear, TM's clear, neck supple PULM: Crackles bases B, normal percussion CV: RRR, no mgr, trace edema GI: BS+, soft, nontender Derm: no cyanosis or rash Psyche: normal mood and affect   Primary care records from earlier this month reviewed were he was treated for hypothyroidism insomnia and anxiety. May 2018 chest x-ray images independently reviewed showing chronic interstitial changes in the bases with emphysema  BMET    Component Value Date/Time   NA 137 07/14/2016 1648   K 3.9 07/14/2016 1648   CL 102 07/14/2016 1648   CO2 28 07/14/2016 1648   GLUCOSE 101 (H) 07/14/2016 1648   BUN 20 07/14/2016 1648   CREATININE 0.76 07/14/2016 1648   CALCIUM 9.3 07/14/2016 1648   GFRNONAA >60 05/12/2016 0618   GFRAA >60 05/12/2016 2426  Assessment & Plan:  Dyspnea, unspecified type  Hypoxemia  Postinflammatory pulmonary fibrosis (HCC)  Seasonal allergic rhinitis due to pollen  Chronic obstructive pulmonary disease, unspecified COPD type ([place])   Discussion: Despite his severe disease with severe dyspnea and severe hypoxemia this is been a stable interval for Mr. [name]. He feels a lot better since we started him on allergic rhinitis therapy. His oxygenation appears to be at baseline though his family notes increasing symptoms over the last month. He is ever the optimist and denies any sort of worsening.  He and I had a frank conversation again  today and I explained to him that he has very severe disease which I cannot cure. I offered supportive care through hospice but he is not ready for that.  Plan: For your emphysema: Keep taking Advair and Incruise as you are doing Key participating in pulmonary rehabilitation  For your interstitial lung disease secondary to chronic aspiration: Continue careful swallowing to avoid aspirating  For your allergic rhinitis: Keep taking Zyrtec, over-the-counter saline rinses  For your chronic respiratory failure with hypoxemia: Continue using 10 L of oxygen per minute at rest, 15 with exertion via the Oxymizer  We will see you back in 6-8 weeks or sooner if needed   Current Outpatient Prescriptions:  .  acetaminophen (TYLENOL) 500 MG tablet, Take 500-1,000 mg by mouth every 6 (six) hours as needed for mild pain, moderate pain, fever or headache. , Disp: , Rfl:  .  atorvastatin (LIPITOR) 20 MG tablet, Take 1 tablet (20 mg total) by mouth daily., Disp: 90 tablet, Rfl: 3 .  busPIRone (BUSPAR) 5 MG tablet, Take 1 tablet (5 mg total) by mouth 2 (two) times daily., Disp: 180 tablet, Rfl: 1 .  carbidopa-levodopa (SINEMET CR) 50-200 MG tablet, Take 1 tablet by mouth at bedtime., Disp: 90 tablet, Rfl: 1 .  carbidopa-levodopa (SINEMET IR) 25-100 MG tablet, Take 2 tablets by mouth 3 (three) times daily., Disp: 540 tablet, Rfl: 1 .  clopidogrel (PLAVIX) 75 MG tablet, Take 1 tablet (75 mg total) by mouth daily., Disp: 90 tablet, Rfl: 1 .  finasteride (PROSCAR) 5 MG tablet, Take 1 tablet (5 mg total) by mouth every evening., Disp: , Rfl:  .  fluticasone (FLONASE) 50 MCG/ACT nasal spray, Place 1 spray into both nostrils daily., Disp: 48 g, Rfl: 1 .  Fluticasone-Salmeterol (ADVAIR DISKUS) 500-50 MCG/DOSE AEPB, Inhale 1 puff into the lungs 2 (two) times daily., Disp: 180 each, Rfl: 1 .  levothyroxine (SYNTHROID, LEVOTHROID) 150 MCG tablet, Take 1 tablet (150 mcg total) by mouth daily., Disp: 90 tablet, Rfl:  1 .  mirabegron ER (MYRBETRIQ) 50 MG TB24 tablet, Take 50 mg by mouth every evening., Disp: , Rfl:  .  Multiple Vitamin (MULTIVITAMIN WITH MINERALS) TABS tablet, Take 1 tablet by mouth daily at 12 noon., Disp: , Rfl:  .  mupirocin ointment (BACTROBAN) 2 %, Place 1 application into the nose 2 (two) times daily., Disp: , Rfl:  .  sodium chloride (OCEAN) 0.65 % SOLN nasal spray, Place 1 spray into both nostrils as needed for congestion., Disp: 1 Bottle, Rfl: 2 .  tamsulosin (FLOMAX) 0.4 MG CAPS capsule, Take 1 capsule (0.4 mg total) by mouth every evening., Disp: , Rfl:  .  traZODone (DESYREL) 100 MG tablet, Take 1 tablet (100 mg total) by mouth at bedtime., Disp: 90 tablet, Rfl: 1 .  umeclidinium bromide (INCRUSE ELLIPTA) 62.5 MCG/INH AEPB, Inhale 1 puff into the lungs daily., Disp: 90 each, Rfl: 1

## 2016-07-28 NOTE — Patient Instructions (Addendum)
For your emphysema: Keep taking Advair and Incruise as you are doing Key participating in pulmonary rehabilitation  For your interstitial lung disease secondary to chronic aspiration: Continue careful swallowing to avoid aspirating  For your allergic rhinitis: Keep taking Zyrtec, over-the-counter saline rinses  For your chronic respiratory failure with hypoxemia: Continue using 10 L of oxygen per minute at rest, 15 with exertion via the Oxymizer  We will see you back in 6-8 weeks or sooner if needed

## 2016-08-01 ENCOUNTER — Encounter (HOSPITAL_COMMUNITY): Payer: Self-pay

## 2016-08-03 ENCOUNTER — Encounter (HOSPITAL_COMMUNITY): Payer: Self-pay

## 2016-08-08 ENCOUNTER — Encounter (HOSPITAL_COMMUNITY)
Admission: RE | Admit: 2016-08-08 | Discharge: 2016-08-08 | Disposition: A | Discharge status: Home / Self Care | Payer: Self-pay | Source: Ambulatory Visit | Attending: Pulmonary Disease | Admitting: Pulmonary Disease

## 2016-08-08 DIAGNOSIS — J8410 Pulmonary fibrosis, unspecified: Secondary | ICD-10-CM | POA: Insufficient documentation

## 2016-08-10 ENCOUNTER — Encounter (HOSPITAL_COMMUNITY)
Admission: RE | Admit: 2016-08-10 | Discharge: 2016-08-10 | Disposition: A | Discharge status: Home / Self Care | Payer: Self-pay | Source: Ambulatory Visit | Attending: Pulmonary Disease | Admitting: Pulmonary Disease

## 2016-08-15 ENCOUNTER — Encounter (HOSPITAL_COMMUNITY): Payer: Self-pay

## 2016-08-17 ENCOUNTER — Encounter (HOSPITAL_COMMUNITY)
Admission: RE | Admit: 2016-08-17 | Discharge: 2016-08-17 | Disposition: A | Discharge status: Home / Self Care | Payer: Self-pay | Source: Ambulatory Visit | Attending: Pulmonary Disease | Admitting: Pulmonary Disease

## 2016-08-20 NOTE — Progress Notes (Signed)
Subjective:    Patient ID: Bradley Bennett, male    DOB: 1932/03/27, 83 y.o.   MRN: [id number]  HPI He is here for an acute visit.   Low blood pressure: His blood pressure has been on the low side typically first thing in the morning. He often takes it after he makes his daughter is moving around. His oxygen level will drop and he will think about checking his blood pressure. As the day progresses his blood pressure will improve. He often gets systolic blood pressures in the 70s or 80s. He does go to pulmonary rehabilitation and his systolic blood pressure is often less than 100. He has to rest and then will improve. He denies any lightheadedness when his blood pressure drops.  Chronic respiratory failure secondary to pulmonary fibrosis and COPD: He is following with pulmonary and has seen Dr. [place] Bells recently. He is on chronic oxygen. His oxygen saturation will drop with minimal-moderate exertion. In reviewing all of his symptoms he does agree that his blood pressure drops when his oxygen level drops. He will experience shortness of breath, coughing and wheezing when his oxygen level drops. He is hoping to get more concentrated oxygen from the [place]. Coronary has discussed with him hospice, but he does not feel that he needs this at this time.  Today when his daughter picked him up he was coughing, wheezing and SOB. He did not feel lightheaded.  This was after he exerted himself.  Sitting and taking deep breaths helps.   Diarrhea:  He is taking 1/2 imodium daily.  He has 4-5 BM today and 3 yesterday.  His stools are soft.  No abdominal, fever, blood in the stool, or nausea.    Right shoulder pain:  It hurts in the morning.  He puts topical lidocaine on it and helps.  He takes 1000 mg of tylenol at night and it helps.  He denies pain with movement of the shoulder. He states during the day he has no pain-just first thing in the morning.  Medications and allergies reviewed with patient and updated  if appropriate.  Patient Active Problem List   Diagnosis Date Noted  . Chronic respiratory failure ([place]) 08/21/2016  . Chronic right shoulder pain 08/21/2016  . Hypotension 07/15/2016  . Diarrhea 07/14/2016  . Prediabetes 07/13/2016  . Dyspnea 06/15/2016  . Allergic rhinitis 05/25/2016  . COPD exacerbation ([place]) 05/10/2016  . Thrush 01/15/2016  . Trigger thumb of both hands 01/15/2016  . COPD (chronic obstructive pulmonary disease) ([place]) 07/16/2015  . Hypoxemia 07/16/2015  . Postinflammatory pulmonary fibrosis ([place]) 07/16/2015  . Parkinson disease ([place]) 07/16/2015  . Hypothyroidism 07/16/2015  . Insomnia 07/16/2015  . BPH (benign prostatic hypertrophy) 07/16/2015  . Anxiety 07/16/2015  . Carpal tunnel syndrome, right 07/16/2015  . Chronic lower back pain 07/16/2015  . Macular degeneration 07/16/2015  . CAD in native artery 06/15/2015  . 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 S/P coronary artery stent placement 06/15/2015  . Hyperlipidemia 06/15/2015    Current Outpatient Prescriptions on File Prior to Visit  Medication Sig Dispense Refill  . acetaminophen (TYLENOL) 500 MG tablet Take 500-1,000 mg by mouth every 6 (six) hours as needed for mild pain, moderate pain, fever or headache.     Marland Kitchen atorvastatin (LIPITOR) 20 MG tablet Take 1 tablet (20 mg total) by mouth daily. 90 tablet 3  . busPIRone (BUSPAR) 5 MG tablet Take 1 tablet (5 mg total) by mouth 2 (two) times daily. 180 tablet  1  . carbidopa-levodopa (SINEMET CR) 50-200 MG tablet Take 1 tablet by mouth at bedtime. 90 tablet 1  . carbidopa-levodopa (SINEMET IR) 25-100 MG tablet Take 2 tablets by mouth 3 (three) times daily. 540 tablet 1  . clopidogrel (PLAVIX) 75 MG tablet Take 1 tablet (75 mg total) by mouth daily. 90 tablet 1  . finasteride (PROSCAR) 5 MG tablet Take 1 tablet (5 mg total) by mouth every evening.    . fluticasone (FLONASE) 50 MCG/ACT nasal spray Place 1 spray into both nostrils daily. 48 g 1  . Fluticasone-Salmeterol  (ADVAIR DISKUS) 500-50 MCG/DOSE AEPB Inhale 1 puff into the lungs 2 (two) times daily. 180 each 1  . levothyroxine (SYNTHROID, LEVOTHROID) 150 MCG tablet Take 1 tablet (150 mcg total) by mouth daily. 90 tablet 1  . mirabegron ER (MYRBETRIQ) 50 MG TB24 tablet Take 50 mg by mouth every evening.    . Multiple Vitamin (MULTIVITAMIN WITH MINERALS) TABS tablet Take 1 tablet by mouth daily at 12 noon.    . mupirocin ointment (BACTROBAN) 2 % Place 1 application into the nose 2 (two) times daily.    . sodium chloride (OCEAN) 0.65 % SOLN nasal spray Place 1 spray into both nostrils as needed for congestion. 1 Bottle 2  . tamsulosin (FLOMAX) 0.4 MG CAPS capsule Take 1 capsule (0.4 mg total) by mouth every evening.    . traZODone (DESYREL) 100 MG tablet Take 1 tablet (100 mg total) by mouth at bedtime. 90 tablet 1  . umeclidinium bromide (INCRUSE ELLIPTA) 62.5 MCG/INH AEPB Inhale 1 puff into the lungs daily. 90 each 1   No current facility-administered medications on file prior to visit.     Past Medical History:  Diagnosis Date  . Arthritis   . Asthma   . Coronary artery disease   . Emphysema of lung ([place])   . Hearing difficulty of both ears   . Heart attack ([place])   . Heart murmur   . Hyperlipidemia   . Hypertension   . ILD (interstitial lung disease) ([place])   . Parkinson disease [facility])     Past Surgical History:  Procedure Laterality Date  . CARPAL TUNNEL RELEASE Right 03/2014  . CORONARY ANGIOPLASTY WITH STENT PLACEMENT    . CORONARY ARTERY BYPASS GRAFT    . HERNIA REPAIR  10/2013   x3   . VEIN BYPASS SURGERY      Social History   Social History  . Marital status: Divorced    Spouse name: N/A  . Number of children: N/A  . Years of education: N/A   Occupational History  . retired     [place] History Main Topics  . Smoking status: Former Smoker    Packs/day: 2.00    Years: 35.00    Quit date: 08/15/1985  . Smokeless tobacco: Never Used     Comment: quit smoking in 1992    . Alcohol use 0.0 oz/week     Comment: twice every 6 months  . Drug use: No  . Sexual activity: No   Other Topics Concern  . None   Social History Narrative  . None    Family History  Problem Relation Age of Onset  . Colon cancer Father   . Stroke Father   . Arthritis Mother   . Arthritis Sister   . Heart disease Brother   . Kidney cancer Brother     Review of Systems  Constitutional: Negative for chills and fever.  Respiratory: Positive  for cough, shortness of breath and wheezing.   Cardiovascular: Negative for chest pain, palpitations and leg swelling.  Gastrointestinal: Positive for diarrhea. Negative for abdominal pain and blood in stool.  Neurological: Negative for light-headedness and headaches.       Objective:   Vitals:   08/21/16 1542  BP: 138/72  Pulse: 97  Resp: 18  Temp: 97.9 F (36.6 C)   Filed Weights   08/21/16 1542  Weight: 191 lb (86.6 kg)   Body mass index is 27.02 kg/m.  Wt Readings from Last 3 Encounters:  08/21/16 191 lb (86.6 kg)  07/28/16 190 lb 3.2 oz (86.3 kg)  07/14/16 189 lb (85.7 kg)     Physical Exam Constitutional: Appears well-developed and well-nourished. No distress.  HENT:  Head: Normocephalic and atraumatic.  Neck: Neck supple. No tracheal deviation present. No thyromegaly present.  No cervical lymphadenopathy Cardiovascular: Normal rate, regular rhythm and normal heart sounds.   No murmur heard. No carotid bruit .  Trace b/l LE edema Pulmonary/Chest: Effort normal and breath sounds normal. No respiratory distress. No has no wheezes. No rales.  Abdomen; soft non tender, non distended Skin: Skin is warm and dry. Not diaphoretic.  Psychiatric: Normal mood and affect. Behavior is normal.       Assessment & Plan:   See Problem List for Assessment and Plan of chronic medical problems.

## 2016-08-21 ENCOUNTER — Encounter: Payer: Self-pay | Admitting: Internal Medicine

## 2016-08-21 ENCOUNTER — Ambulatory Visit (INDEPENDENT_AMBULATORY_CARE_PROVIDER_SITE_OTHER): Payer: Medicare Other | Admitting: Internal Medicine

## 2016-08-21 VITALS — BP 138/72 | HR 97 | Temp 97.9°F | Resp 18 | Wt 191.0 lb

## 2016-08-21 DIAGNOSIS — R197 Diarrhea, unspecified: Secondary | ICD-10-CM | POA: Diagnosis not present

## 2016-08-21 DIAGNOSIS — I2510 Atherosclerotic heart disease of native coronary artery without angina pectoris: Secondary | ICD-10-CM

## 2016-08-21 DIAGNOSIS — G8929 Other chronic pain: Secondary | ICD-10-CM | POA: Diagnosis not present

## 2016-08-21 DIAGNOSIS — I959 Hypotension, unspecified: Secondary | ICD-10-CM

## 2016-08-21 DIAGNOSIS — J9610 Chronic respiratory failure, unspecified whether with hypoxia or hypercapnia: Secondary | ICD-10-CM | POA: Insufficient documentation

## 2016-08-21 DIAGNOSIS — J9611 Chronic respiratory failure with hypoxia: Secondary | ICD-10-CM

## 2016-08-21 DIAGNOSIS — M25511 Pain in right shoulder: Secondary | ICD-10-CM

## 2016-08-21 NOTE — Patient Instructions (Addendum)
   Medications reviewed and updated.  Changes include increasing your imodium.  Please followup in 6 months, sooner if needed

## 2016-08-21 NOTE — Assessment & Plan Note (Signed)
Still having diarrhea Not controlled recently Increase imodium - can refer to GI but given respiratory status will try to manage medically Will try 1 tab imodium daily or 1 tab alternating with 1/2 tab

## 2016-08-21 NOTE — Assessment & Plan Note (Signed)
Will refer to sports medicine for further evaluation and treatment  possible steroid injection

## 2016-08-21 NOTE — Assessment & Plan Note (Signed)
Following with pulmonary He is eligible for hospice, but is not ready His oxygen level drops with minimal-moderate exertion and this is what causes his blood pressure to drop-symptoms include shortness of breath, coughing and wheezing Trying to get more concentrated oxygen from the [place], which may help

## 2016-08-21 NOTE — Progress Notes (Signed)
Bradley Bennett was seen today in the movement disorders clinic for neurologic consultation at the request of Burns, Bradley Lick, MD.  The consultation is for the evaluation of PD.  This patient is accompanied in the office by his child who supplements the history.  Pt just moved here from [place].  I don't have any previous neurology records.  Pt reports that he was dx with PD about 4 years ago per pt and about 10 years ago per daughter.  His first sx was L hand tremor.  Daughter states he was on something else prior to the levodopa but she isn't sure that it was specifically for PD.  He is currently on carbidopa/levodopa 25/100, 1.5 tablets tid (8:30am/5:30/9pm).  Daughter thinks that the med wears off   11/16/15 update: The patient follows up today, accompanied by his daughter who supplements the history.  Last visit, we talked about changing his dosing of his medication, so that he takes carbidopa/levodopa 25/100, 2 tablets at 8 AM/noon/4 PM and then we added carbidopa/levodopa 50/200 at bedtime.  Daughter thinks that it helps.  He had an MRI of the brain and cervical spine since our last visit.  The MRI of the brain demonstrated mild to moderate white matter disease.  The MRI of the cervical spine demonstrated degenerative changes and spinal stenosis, but nothing surgical noted.  He has not had any falls since last visit.  No hallucinations.  No lightheadedness or near syncope.  He is using melatonin, 10 mg, for sleep and trazodone for sleep.  Doing pulm rehab on tues/thurs but daughter states that more active on the other days.  He is doing Wii bowling.  No hallucinations.  He lives in independent assisted living but he does his own medication.  His daughter prepares the pill box and he has no trouble remembering to take the medication.    03/24/16 update:  Patient follows up today, accompanied by his daughter supplements the history.  He is on carbidopa/levodopa 25/100, 2 tablet at 8 AM/noon/4 PM and  carbidopa/levodopa 50/200 at bedtime.  Noting some tremor in the leg.  Daughter fills his pill box and noting a lot of extra pills at the end of the week.  Patient admits that he forgot the middle of the day dose today (supposed to take at noon and it is 3pm).  He has had no falls.  No hallucinations.  No lightheadedness or near syncope.  Not able to exercise because of pulmonary constraints.  In pulmonary rehab and reviewed those records.  He remains in independent assisted living. Daughter quietly mentions to me that Dr. [place] Bells mentioned hospice.  Noting paresthesias of the hands and feet.    08/23/16 update: Patient seen today in follow-up, accompanied by his daughter who supplements the history.  Patient is on carbidopa/levodopa 25/100, 2 tablets at 8 AM/noon/4 PM and carbidopa/levodopa 50/200 at bed. Has been remembering to take meds.  Has been experiencing low blood pressure and saw PCP on 08/21/16 for this.   The records that were made available to me were reviewed.  Has been attending pulm rehab.  His O2 goes down to 70% there.  His daughter is trying to get him to accept hospice.  He is not ready to do that yet.  PREVIOUS MEDICATIONS: Sinemet  ALLERGIES:   Allergies  Allergen Reactions  . Isosorbide Other (See Comments)    Reaction:  Headaches     CURRENT MEDICATIONS:  Outpatient Encounter Prescriptions as of 08/23/2016  Medication Sig  . acetaminophen (TYLENOL) 500 MG tablet Take 500-1,000 mg by mouth every 6 (six) hours as needed for mild pain, moderate pain, fever or headache.   Marland Kitchen atorvastatin (LIPITOR) 20 MG tablet Take 1 tablet (20 mg total) by mouth daily.  . busPIRone (BUSPAR) 5 MG tablet Take 1 tablet (5 mg total) by mouth 2 (two) times daily.  . carbidopa-levodopa (SINEMET CR) 50-200 MG tablet Take 1 tablet by mouth at bedtime.  . carbidopa-levodopa (SINEMET IR) 25-100 MG tablet Take 2 tablets by mouth 3 (three) times daily.  . clopidogrel (PLAVIX) 75 MG tablet Take 1 tablet (75  mg total) by mouth daily.  . finasteride (PROSCAR) 5 MG tablet Take 1 tablet (5 mg total) by mouth every evening.  . fluticasone (FLONASE) 50 MCG/ACT nasal spray Place 1 spray into both nostrils daily.  . Fluticasone-Salmeterol (ADVAIR DISKUS) 500-50 MCG/DOSE AEPB Inhale 1 puff into the lungs 2 (two) times daily.  Marland Kitchen levothyroxine (SYNTHROID, LEVOTHROID) 150 MCG tablet Take 1 tablet (150 mcg total) by mouth daily.  . mirabegron ER (MYRBETRIQ) 50 MG TB24 tablet Take 50 mg by mouth every evening.  . Multiple Vitamin (MULTIVITAMIN WITH MINERALS) TABS tablet Take 1 tablet by mouth daily at 12 noon.  . mupirocin ointment (BACTROBAN) 2 % Place 1 application into the nose 2 (two) times daily.  . sodium chloride (OCEAN) 0.65 % SOLN nasal spray Place 1 spray into both nostrils as needed for congestion.  . tamsulosin (FLOMAX) 0.4 MG CAPS capsule Take 1 capsule (0.4 mg total) by mouth every evening.  . traZODone (DESYREL) 100 MG tablet Take 1 tablet (100 mg total) by mouth at bedtime.  Marland Kitchen umeclidinium bromide (INCRUSE ELLIPTA) 62.5 MCG/INH AEPB Inhale 1 puff into the lungs daily.   No facility-administered encounter medications on file as of 08/23/2016.     PAST MEDICAL HISTORY:   Past Medical History:  Diagnosis Date  . Arthritis   . Asthma   . Coronary artery disease   . Emphysema of lung ([place])   . Hearing difficulty of both ears   . Heart attack ([place])   . Heart murmur   . Hyperlipidemia   . Hypertension   . ILD (interstitial lung disease) ([place])   . Parkinson disease ([place])     PAST SURGICAL HISTORY:   Past Surgical History:  Procedure Laterality Date  . CARPAL TUNNEL RELEASE Right 03/2014  . CORONARY ANGIOPLASTY WITH STENT PLACEMENT    . CORONARY ARTERY BYPASS GRAFT    . HERNIA REPAIR  10/2013   x3   . VEIN BYPASS SURGERY      SOCIAL HISTORY:   Social History   Social History  . Marital status: Divorced    Spouse name: N/A  . Number of children: N/A  . Years of education: N/A    Occupational History  . retired     [place] History Main Topics  . Smoking status: Former Smoker    Packs/day: 2.00    Years: 35.00    Quit date: 08/15/1985  . Smokeless tobacco: Never Used     Comment: quit smoking in 1992  . Alcohol use 0.0 oz/week     Comment: twice every 6 months  . Drug use: No  . Sexual activity: No   Other Topics Concern  . Not on file   Social History Narrative  . No narrative on file    FAMILY HISTORY:   Family Status  Relation Status  . Father Deceased  stroke, colon cancer  . Mother Deceased       arthritis, ruptured spleen  . Brother Deceased       MI, DM, kidney cancer  . Sister Alive       lupus  . Sister Alive       heart disease, DM  . Daughter Alive       2, healthy  . Son Alive       1, healthy  . Sister (Not Specified)  . Brother (Not Specified)    ROS:  A complete 10 system review of systems was obtained and was unremarkable apart from what is mentioned above.  PHYSICAL EXAMINATION:    VITALS:   Vitals:   08/23/16 1432  BP: (!) 94/54  Pulse: (!) 102  SpO2: (!) 80%  Weight: 188 lb (85.3 kg)  Height: 5' 10.5" (1.791 m)   Initial O2 sat low here because ran out of O2 in lobby.  Given O2 in office and O2 went up to 95%.  GEN:  The patient appears stated age and is in NAD. HEENT:  Normocephalic, atraumatic.  The mucous membranes are moist. The superficial temporal arteries are without ropiness or tenderness. CV:  RRR Lungs:  CTAB.  He is wearing O2 Neck/HEME:  There are no carotid bruits bilaterally.  Neurological examination:  Orientation:  Montreal Cognitive Assessment  08/16/2015  Visuospatial/ Executive (0/5) 5  Naming (0/3) 3  Attention: Read list of digits (0/2) 2  Attention: Read list of letters (0/1) 1  Attention: Serial 7 subtraction starting at 100 (0/3) 3  Language: Repeat phrase (0/2) 2  Language : Fluency (0/1) 1  Abstraction (0/2) 2  Delayed Recall (0/5) 2  Orientation (0/6) 6   Total 27  Adjusted Score (based on education) 27   Cranial nerves: There is good facial symmetry.  The visual fields are full to confrontational testing. The speech is fluent and clear. Soft palate rises symmetrically and there is no tongue deviation. Hearing is decreased to conversational tone. Sensation: Sensation is intact to light touch throughout. Motor: Strength is 5/5 in the bilateral upper and lower extremities.   Shoulder shrug is equal and symmetric.  There is no pronator drift. Deep tendon reflexes: Deep tendon reflexes are 2+-3/4 at the bilateral biceps, triceps, brachioradialis,   Movement examination: Tone: There is no increased tone in the UE's.     The tone in the lower extremities is normal.  Abnormal movements: There is LUE resting tremor that is rare Coordination:  There is decreased foot taps only Gait and Station: did not formally assess because O2 had dropped as had run out of his O2 by the time examiner got in the room (and he was given O2 to use but was SOB).  ASSESSMENT/PLAN:  1.  idiopathic Parkinson's disease.  The patient has tremor, bradykinesia, rigidity and Minimal postural instability.  -We will continue carbidopa/levodopa 25/100, 2 tablets at 8 AM/noon/4 PM.    -He will continue carbidopa/levodopa 50/200 at bedtime.  -He really cannot exercise because of pulmonary disease, but he doesn't wish to pursue hospice yet.    2.  Hyperreflexia  -MRI of the brain just demonstrated mild to moderate white matter disease and MRI the cervical spine demonstrates degenerative changes, but no cervical surgical lesions.  3.  Dysphagia with aspiration  -Per pulm records, pt does have a hx of dysphagia with chronic aspiration/post inflammatory pulm fibrosis but refuses thickening agents.  His last swallow study per the patient was over  a year ago, but if he is unwilling to follow recommendations, there is no use repeating it.  -pt is DNR  4.  Orthostasis  -Likely  multifactorial.  His Flomax may be contributing and we discussed this in detail today.  He is going to contact his urologist.  -Discussed the importance of increasing his water intake.  He is drinking almost none currently.  -If the above methods do not work, then we can certainly try something to raise up his blood pressure.  We decided to hold off on that for now.  We did talk about the abdominal compression binder.  We talked about medications as well.  5.  Hypoxia secondary to lung disease  -He was given our oxygen tank to wear home today.  His oxygen tank had run out of oxygen.  6.   Follow up is anticipated in the next few months, sooner should new neurologic issues arise.  Much greater than 50% of this visit was spent in counseling and coordinating care.  Total face to face time:  35 min

## 2016-08-21 NOTE — Assessment & Plan Note (Signed)
Blood pressure low at times-his blood pressure cuff readings lower than ours does and maybe falsely low His blood pressure still runs in the low side because he gets low readings at rehabilitation, but is more symptomatic from the low oxygen The major issue here is his low oxygen He is not currently on any blood pressure medications He will continue to monitor

## 2016-08-22 ENCOUNTER — Encounter (HOSPITAL_COMMUNITY)
Admission: RE | Admit: 2016-08-22 | Discharge: 2016-08-22 | Disposition: A | Discharge status: Home / Self Care | Payer: Self-pay | Source: Ambulatory Visit | Attending: Pulmonary Disease | Admitting: Pulmonary Disease

## 2016-08-23 ENCOUNTER — Ambulatory Visit (INDEPENDENT_AMBULATORY_CARE_PROVIDER_SITE_OTHER): Payer: Medicare Other | Admitting: Neurology

## 2016-08-23 ENCOUNTER — Encounter: Payer: Self-pay | Admitting: Neurology

## 2016-08-23 VITALS — BP 94/54 | HR 102 | Ht 70.5 in | Wt 188.0 lb

## 2016-08-23 DIAGNOSIS — R0902 Hypoxemia: Secondary | ICD-10-CM | POA: Diagnosis not present

## 2016-08-23 DIAGNOSIS — G20 Parkinson's disease: Secondary | ICD-10-CM

## 2016-08-23 DIAGNOSIS — G903 Multi-system degeneration of the autonomic nervous system: Secondary | ICD-10-CM

## 2016-08-23 DIAGNOSIS — R1319 Other dysphagia: Secondary | ICD-10-CM

## 2016-08-23 DIAGNOSIS — I2510 Atherosclerotic heart disease of native coronary artery without angina pectoris: Secondary | ICD-10-CM | POA: Diagnosis not present

## 2016-08-24 ENCOUNTER — Encounter (HOSPITAL_COMMUNITY): Payer: Self-pay

## 2016-08-25 ENCOUNTER — Ambulatory Visit: Payer: Medicare Other | Admitting: Neurology

## 2016-08-29 ENCOUNTER — Encounter (HOSPITAL_COMMUNITY): Payer: Self-pay

## 2016-08-31 ENCOUNTER — Encounter (HOSPITAL_COMMUNITY): Payer: Self-pay

## 2016-09-05 ENCOUNTER — Encounter (HOSPITAL_COMMUNITY)
Admission: RE | Admit: 2016-09-05 | Discharge: 2016-09-05 | Disposition: A | Discharge status: Home / Self Care | Payer: Medicare Other | Source: Ambulatory Visit | Attending: Internal Medicine | Admitting: Internal Medicine

## 2016-09-07 ENCOUNTER — Encounter (HOSPITAL_COMMUNITY)
Admission: RE | Admit: 2016-09-07 | Discharge: 2016-09-07 | Disposition: A | Discharge status: Home / Self Care | Payer: Self-pay | Source: Ambulatory Visit | Attending: Pulmonary Disease | Admitting: Pulmonary Disease

## 2016-09-07 DIAGNOSIS — J8410 Pulmonary fibrosis, unspecified: Secondary | ICD-10-CM | POA: Insufficient documentation

## 2016-09-08 ENCOUNTER — Encounter: Payer: Self-pay | Admitting: Internal Medicine

## 2016-09-09 NOTE — Progress Notes (Deleted)
[facility] [address] [place], [place] [address] Phone: [phone] Subjective:    I'm seeing this patient by the request  of:  [name], MD   CC: Right shoulder pain  [id number]  Bradley Bennett is a 83 y.o. male coming in with complaint of shoulder pain. Past medical history significant for Parkinson's and dependence on oxygen.  Onset-  Location Duration-  Character- Aggravating factors- Reliving factors-  Therapies tried-  Severity-     Past Medical History:  Diagnosis Date  . Arthritis   . Asthma   . Coronary artery disease   . Emphysema of lung ([place])   . Hearing difficulty of both ears   . Heart attack ([place])   . Heart murmur   . Hyperlipidemia   . Hypertension   . ILD (interstitial lung disease) ([place])   . Parkinson disease [facility])    Past Surgical History:  Procedure Laterality Date  . CARPAL TUNNEL RELEASE Right 03/2014  . CORONARY ANGIOPLASTY WITH STENT PLACEMENT    . CORONARY ARTERY BYPASS GRAFT    . HERNIA REPAIR  10/2013   x3   . VEIN BYPASS SURGERY     Social History   Social History  . Marital status: Divorced    Spouse name: N/A  . Number of children: N/A  . Years of education: N/A   Occupational History  . retired     [place] History Main Topics  . Smoking status: Former Smoker    Packs/day: 2.00    Years: 35.00    Quit date: 08/15/1985  . Smokeless tobacco: Never Used     Comment: quit smoking in 1992  . Alcohol use 0.0 oz/week     Comment: twice every 6 months  . Drug use: No  . Sexual activity: No   Other Topics Concern  . Not on file   Social History Narrative  . No narrative on file   Allergies  Allergen Reactions  . Isosorbide Other (See Comments)    Reaction:  Headaches    Family History  Problem Relation Age of Onset  . Colon cancer Father   . Stroke Father   . Arthritis Mother   . Arthritis Sister   . Heart disease Brother   . Kidney cancer Brother       Past medical history, social, surgical and family history all reviewed in electronic medical record.  No pertanent information unless stated regarding to the chief complaint.   Review of Systems:Review of systems updated and as accurate as of 09/09/16  No headache, visual changes, nausea, vomiting, diarrhea, constipation, dizziness, abdominal pain, skin rash, fevers, chills, night sweats, weight loss, swollen lymph nodes, body aches, joint swelling, muscle aches, chest pain, shortness of breath, mood changes.   Objective  There were no vitals taken for this visit. Systems examined below as of 09/09/16   General: No apparent distress alert and oriented x3 mood and affect normal, dressed appropriately.  HEENT: Pupils equal, extraocular movements intact  Respiratory: Patient's speak in full sentences and does not appear short of breath  Cardiovascular: No lower extremity edema, non tender, no erythema  Skin: Warm dry intact with no signs of infection or rash on extremities or on axial skeleton.  Abdomen: Soft nontender  Neuro: Cranial nerves II through XII are intact, neurovascularly intact in all extremities with 2+ DTRs and 2+ pulses.  Lymph: No lymphadenopathy of posterior or anterior cervical chain or axillae bilaterally.  Gait normal with good balance and coordination.  MSK:  Non tender with full range of motion and good stability and symmetric strength and tone of  elbows, wrist, hip, knee and ankles bilaterally.  Shoulder: Right Inspection reveals no abnormalities, atrophy or asymmetry. Palpation is normal with no tenderness over AC joint or bicipital groove. ROM is full in all planes passively. Rotator cuff strength normal throughout. signs of impingement with positive Neer and Hawkin's tests, but negative empty can sign. Speeds and Yergason's tests normal. No labral pathology noted with negative Obrien's, negative clunk and good stability. Normal scapular function  observed. No painful arc and no drop arm sign. No apprehension sign  MSK US performed of: Right This study was ordered, performed, and interpreted by Charlann Boxer D.O.  Shoulder:   Supraspinatus:  Appears normal on long and transverse views, Bursal bulge seen with shoulder abduction on impingement view. Infraspinatus:  Appears normal on long and transverse views. Significant increase in Doppler flow Subscapularis:  Appears normal on long and transverse views. Positive bursa Teres Minor:  Appears normal on long and transverse views. AC joint:  Capsule undistended, no geyser sign. Glenohumeral Joint:  Appears normal without effusion. Glenoid Labrum:  Intact without visualized tears. Biceps Tendon:  Appears normal on long and transverse views, no fraying of tendon, tendon located in intertubercular groove, no subluxation with shoulder internal or external rotation.  Impression: Subacromial bursitis  Procedure: Real-time Ultrasound Guided Injection of right glenohumeral joint Device: GE Logiq E  Ultrasound guided injection is preferred based studies that show increased duration, increased effect, greater accuracy, decreased procedural pain, increased response rate with ultrasound guided versus blind injection.  Verbal informed consent obtained.  Time-out conducted.  Noted no overlying erythema, induration, or other signs of local infection.  Skin prepped in a sterile fashion.  Local anesthesia: Topical Ethyl chloride.  With sterile technique and under real time ultrasound guidance:  Joint visualized.  23g 1  inch needle inserted posterior approach. Pictures taken for needle placement. Patient did have injection of 2 cc of 1% lidocaine, 2 cc of 0.5% Marcaine, and 1.0 cc of Kenalog 40 mg/dL. Completed without difficulty  Pain immediately resolved suggesting accurate placement of the medication.  Advised to call if fevers/chills, erythema, induration, drainage, or persistent bleeding.  Images  permanently stored and available for review in the ultrasound unit.  Impression: Technically successful ultrasound guided injection.    Impression and Recommendations:     This case required medical decision making of moderate complexity.      Note: This dictation was prepared with Dragon dictation along with smaller phrase technology. Any transcriptional errors that result from this process are unintentional.

## 2016-09-10 NOTE — Telephone Encounter (Signed)
Can you write a letter stating that he is too sick to travel due to chronic respiratory failure due to COPD on oxygen therapy.  He also has heart disease and parkinsons disease.  She is aware we will be writing this for him.  See mychart message.    Thank you.

## 2016-09-10 NOTE — Telephone Encounter (Signed)
Please send to urology a list of his blood pressure measures.

## 2016-09-11 ENCOUNTER — Ambulatory Visit: Payer: Medicare Other | Admitting: Family Medicine

## 2016-09-12 ENCOUNTER — Encounter: Payer: Self-pay | Admitting: Emergency Medicine

## 2016-09-12 ENCOUNTER — Encounter (HOSPITAL_COMMUNITY)
Admission: RE | Admit: 2016-09-12 | Discharge: 2016-09-12 | Disposition: A | Discharge status: Home / Self Care | Payer: Self-pay | Source: Ambulatory Visit | Attending: Pulmonary Disease | Admitting: Pulmonary Disease

## 2016-09-12 NOTE — Telephone Encounter (Signed)
Letter has been written and picked up by daughter.

## 2016-09-12 NOTE — Telephone Encounter (Signed)
Pt daughter called regarding this, she would like to pick up the letter today by 2:30, the letter just needs to states he is too sick to travel and he has high oxygen needs.  Please advise and call back.

## 2016-09-14 ENCOUNTER — Encounter (HOSPITAL_COMMUNITY): Admission: RE | Admit: 2016-09-14 | Payer: Self-pay | Source: Ambulatory Visit

## 2016-09-14 ENCOUNTER — Ambulatory Visit (INDEPENDENT_AMBULATORY_CARE_PROVIDER_SITE_OTHER): Payer: Medicare Other | Admitting: Family Medicine

## 2016-09-14 ENCOUNTER — Encounter: Payer: Self-pay | Admitting: Family Medicine

## 2016-09-14 DIAGNOSIS — I2510 Atherosclerotic heart disease of native coronary artery without angina pectoris: Secondary | ICD-10-CM

## 2016-09-14 DIAGNOSIS — M5412 Radiculopathy, cervical region: Secondary | ICD-10-CM

## 2016-09-14 MED ORDER — GABAPENTIN 100 MG PO CAPS: 200.0000 mg | ORAL_CAPSULE | Freq: Every day | ORAL | 3 refills | Status: DC

## 2016-09-14 NOTE — Progress Notes (Signed)
[facility] [address] [address] [place], [place] [address] Phone: [phone] Subjective:    I'm seeing this patient by the request  of:    CC:   [id number]  Bradley Bennett is a 83 y.o. male coming in with complaint of rigfht shoulder pain. His shoulder has been bothering him for a couple of months and he has pain when he wakes up. He has been carrying a portable oxygen tank for the past 2 weeks and was lifting oxygen tanks into a carrier before that.   Onset- 2 months Location- right cervical spine into right shoulder to right elbow Duration- intermittent Character-achy  Aggravating factors-pattern Reliving factors- Tylenol, heat Therapies tried- heat Severity-6     Past Medical History:  Diagnosis Date  . Arthritis   . Asthma   . Coronary artery disease   . Emphysema of lung ([place])   . Hearing difficulty of both ears   . Heart attack ([place])   . Heart murmur   . Hyperlipidemia   . Hypertension   . ILD (interstitial lung disease) ([place])   . Parkinson disease [facility])    Past Surgical History:  Procedure Laterality Date  . CARPAL TUNNEL RELEASE Right 03/2014  . CORONARY ANGIOPLASTY WITH STENT PLACEMENT    . CORONARY ARTERY BYPASS GRAFT    . HERNIA REPAIR  10/2013   x3   . VEIN BYPASS SURGERY     Social History   Social History  . Marital status: Divorced    Spouse name: N/A  . Number of children: N/A  . Years of education: N/A   Occupational History  . retired     [place] History Main Topics  . Smoking status: Former Smoker    Packs/day: 2.00    Years: 35.00    Quit date: 08/15/1985  . Smokeless tobacco: Never Used     Comment: quit smoking in 1992  . Alcohol use 0.0 oz/week     Comment: twice every 6 months  . Drug use: No  . Sexual activity: No   Other Topics Concern  . Not on file   Social History Narrative  . No narrative on file   Allergies  Allergen Reactions  . Isosorbide Other (See Comments)    Reaction:   Headaches    Family History  Problem Relation Age of Onset  . Colon cancer Father   . Stroke Father   . Arthritis Mother   . Arthritis Sister   . Heart disease Brother   . Kidney cancer Brother      Past medical history, social, surgical and family history all reviewed in electronic medical record.  No pertanent information unless stated regarding to the chief complaint.   Review of Systems:Review of systems updated and as accurate as of 09/14/16  No headache, visual changes, nausea, vomiting, diarrhea, constipation, dizziness, abdominal pain, skin rash, fevers, chills, night sweats, weight loss, swollen lymph nodes, body aches, joint swelling, muscle aches, chest pain, shortness of breath, mood changes.   Objective  Blood pressure 122/72, pulse 82, height 5' 10.5" (1.791 m), weight 179 lb (81.2 kg).  Systems examined below as of 09/14/16   General: No apparent distress alert and oriented x2 mood and affect normal, dressed appropriately. Patient does have baseline dementia. Masked facies.  HEENT: Pupils equal, extraocular movements intact  Respiratory: Patient's speak in full sentences and does not appear short of breath  Cardiovascular: No lower extremity edema, non tender, no erythema  Skin: Warm dry intact with no signs of infection or rash on extremities or on axial skeleton.  Abdomen: Soft nontender  Neuro: Cranial nerves II through XII are intact, neurovascularly intact in all extremities with 2+ DTRs and 2+ pulses.  Lymph: No lymphadenopathy of posterior or anterior cervical chain or axillae bilaterally.  Gait normal with good balance and coordination.  MSK:  Non tender with full range of motion and good stability and symmetric strength and tone of elbows, wrist, hip, knee and ankles bilaterally. Arthritic changes of multiple joints. Patient does have a resting tremor of the left upper extremity Neck: Inspection loss of lordosis. No palpable stepoffs. Positive Spurling's  maneuver. Severe decrease in range of motion with worsening pain with extension or even 5. Increasing radicular pain down the right arm Grip strength and sensation normal in bilateral hands 4 out of 5 strength in the C6-C7 distribution compared to the contralateral side No sensory change to C4 to T1 Negative Hoffman sign bilaterally Reflexes normal  Shoulder: Right Inspection reveals no abnormalities, atrophy or asymmetry. Palpation is normal with no tenderness over AC joint or bicipital groove. ROM is full in all planes. Rotator cuff strength normal throughout. Minimal impingement Speeds and Yergason's tests normal. No labral pathology noted with negative Obrien's, negative clunk and good stability. Normal scapular function observed. No painful arc and no drop arm sign. No apprehension sign Contralateral shoulder about the same      Impression and Recommendations:     This case required medical decision making of moderate complexity.      Note: This dictation was prepared with Dragon dictation along with smaller phrase technology. Any transcriptional errors that result from this process are unintentional.

## 2016-09-14 NOTE — Telephone Encounter (Signed)
Sent!

## 2016-09-14 NOTE — Patient Instructions (Signed)
Good to see you  Ice 20 minutes 2 times daily. Usually after activity and before bed. Exercises 3 times a week.  pennsaid pinkie amount topically 2 times daily as needed.  Gabapentin 200mg  at night Over the counter try Tart cherry extract any dose at night See me again in 3 weeks and if not better we will try injection in the shoulder but I do feel everything is coming from the back

## 2016-09-15 DIAGNOSIS — M5412 Radiculopathy, cervical region: Secondary | ICD-10-CM | POA: Insufficient documentation

## 2016-09-15 NOTE — Assessment & Plan Note (Signed)
Patient does have more of a cervical radiculopathy. I do not think this is the shoulder. Discussed with patient at great length. Because of his other comorbidities would want do more with conservative therapy. Because the radicular symptoms going to most the pain patient will be started on gabapentin at a low dose. Warned of potential side effects with his other medications but likely the safest. We discussed icing regimen, given topical anti-inflammatories. Continuing to have difficulty we may need to consider advanced imaging as well as possible epidurals but with patient's being on a blood thinner do not take it would be helpful and we'll be difficult to balance. Discussed with patient as well as primary caregiver. We'll consider intra-articular injection at follow-up if continuing have pain.

## 2016-09-19 ENCOUNTER — Encounter (HOSPITAL_COMMUNITY)
Admission: RE | Admit: 2016-09-19 | Discharge: 2016-09-19 | Disposition: A | Discharge status: Home / Self Care | Payer: Self-pay | Source: Ambulatory Visit | Attending: Pulmonary Disease | Admitting: Pulmonary Disease

## 2016-09-21 DIAGNOSIS — D485 Neoplasm of uncertain behavior of skin: Secondary | ICD-10-CM | POA: Diagnosis not present

## 2016-09-21 DIAGNOSIS — L570 Actinic keratosis: Secondary | ICD-10-CM | POA: Diagnosis not present

## 2016-09-22 ENCOUNTER — Ambulatory Visit (INDEPENDENT_AMBULATORY_CARE_PROVIDER_SITE_OTHER): Payer: Medicare Other | Admitting: Adult Health

## 2016-09-22 ENCOUNTER — Encounter: Payer: Self-pay | Admitting: Adult Health

## 2016-09-22 DIAGNOSIS — I2510 Atherosclerotic heart disease of native coronary artery without angina pectoris: Secondary | ICD-10-CM | POA: Diagnosis not present

## 2016-09-22 DIAGNOSIS — J9611 Chronic respiratory failure with hypoxia: Secondary | ICD-10-CM | POA: Diagnosis not present

## 2016-09-22 DIAGNOSIS — J8410 Pulmonary fibrosis, unspecified: Secondary | ICD-10-CM

## 2016-09-22 DIAGNOSIS — J449 Chronic obstructive pulmonary disease, unspecified: Secondary | ICD-10-CM

## 2016-09-22 NOTE — Assessment & Plan Note (Signed)
Cont on O2  Keep sat >88-90%  Plan  Patient Instructions  Continue on Oxygen 2l/m rest , 15l/ walking/exercise.  Change oxygen device as needed to keep Oxygen level >88-90%.  Continue on Advair and Incruse.  Follow up with Dr Lake Bells in 2-3 months and As needed

## 2016-09-22 NOTE — Assessment & Plan Note (Signed)
Stable w/out flare   Plan  Patient Instructions  Continue on Oxygen 2l/m rest , 15l/ walking/exercise.  Change oxygen device as needed to keep Oxygen level >88-90%.  Continue on Advair and Incruse.  Follow up with Dr Lake Bells in 2-3 months and As needed

## 2016-09-22 NOTE — Patient Instructions (Addendum)
Continue on Oxygen 2l/m rest , 15l/ walking/exercise.  Change oxygen device as needed to keep Oxygen level >88-90%.  Continue on Advair and Incruse.  Follow up with Dr Lake Bells in 2-3 months and As needed

## 2016-09-22 NOTE — Assessment & Plan Note (Signed)
Compensated on present regimen without flare.  Plan  Patient Instructions  Continue on Oxygen 2l/m rest , 15l/ walking/exercise.  Change oxygen device as needed to keep Oxygen level >88-90%.  Continue on Advair and Incruse.  Follow up with Dr Lake Bells in 2-3 months and As needed

## 2016-09-22 NOTE — Progress Notes (Signed)
@Patient  ID: Bradley Bennett, male    DOB: [birth year], 83 y.o.   MRN: [id number]  Chief Complaint  Patient presents with  . Follow-up    COPD     Referring provider: [name], MD  HPI: 83 yo male former smoker followed for COPD, Fibrosis , Chronic aspiration , O2 RF (2L rest , 15 L w/ act)  Has Parkinsons.  Follows at [place]   He was started on oxygen around 2013 after his diagnosis of COPD in 2013.  He was diagnosed with an ILD around 62 in [place]. A modified barium swallow in 2015 showed aspiration.  He smoked 35 years, 2 ppd, quit around 1992.    09/22/2016 Follow up : COPD , Fibrosis , O2 RF , Chronic Aspiration  Patient presents for a two-month follow-up. Patient says overall his breathing is doing about the same. He gets winded with minimal activity. He does follow-up at the [facility]. He gets his oxygen through the [place]. He uses liquid oxygen. He is currently on 2 L at rest. And has to use 15 L with any activity such as walking or exercise. He remains on Advair and Incruse.  He denies any flare of cough, wheezing, increased leg swelling or difficulty swallowing. PVX and Prevnar utd.   Allergies  Allergen Reactions  . Isosorbide Other (See Comments)    Reaction:  Headaches     Immunization History  Administered Date(s) Administered  . Influenza Split 11/09/2014  . Influenza, High Dose Seasonal PF 12/04/2015  . Pneumococcal Conjugate-13 01/14/2016  . Pneumococcal Polysaccharide-23 10/12/2014    Past Medical History:  Diagnosis Date  . Arthritis   . Asthma   . Coronary artery disease   . Emphysema of lung ([place])   . Hearing difficulty of both ears   . Heart attack ([place])   . Heart murmur   . Hyperlipidemia   . Hypertension   . ILD (interstitial lung disease) ([place])   . Parkinson disease ([place])     Tobacco History: History  Smoking Status  . Former Smoker  . Packs/day: 2.00  . Years: 35.00  . Quit date: 08/15/1985  Smokeless Tobacco  . Never Used   Comment: quit smoking in 1992   Counseling given: Not Answered   Outpatient Encounter Prescriptions as of 09/22/2016  Medication Sig  . acetaminophen (TYLENOL) 500 MG tablet Take 500-1,000 mg by mouth every 6 (six) hours as needed for mild pain, moderate pain, fever or headache.   Marland Kitchen atorvastatin (LIPITOR) 20 MG tablet Take 1 tablet (20 mg total) by mouth daily.  . busPIRone (BUSPAR) 5 MG tablet Take 1 tablet (5 mg total) by mouth 2 (two) times daily.  . carbidopa-levodopa (SINEMET CR) 50-200 MG tablet Take 1 tablet by mouth at bedtime.  . carbidopa-levodopa (SINEMET IR) 25-100 MG tablet Take 2 tablets by mouth 3 (three) times daily.  . clopidogrel (PLAVIX) 75 MG tablet Take 1 tablet (75 mg total) by mouth daily.  . finasteride (PROSCAR) 5 MG tablet Take 1 tablet (5 mg total) by mouth every evening.  . fluticasone (FLONASE) 50 MCG/ACT nasal spray Place 1 spray into both nostrils daily.  . Fluticasone-Salmeterol (ADVAIR DISKUS) 500-50 MCG/DOSE AEPB Inhale 1 puff into the lungs 2 (two) times daily.  Marland Kitchen gabapentin (NEURONTIN) 100 MG capsule Take 2 capsules (200 mg total) by mouth at bedtime.  Marland Kitchen levothyroxine (SYNTHROID, LEVOTHROID) 150 MCG tablet Take 1 tablet (150 mcg total) by mouth daily.  . mirabegron ER (MYRBETRIQ) 50  MG TB24 tablet Take 50 mg by mouth every evening.  . Multiple Vitamin (MULTIVITAMIN WITH MINERALS) TABS tablet Take 1 tablet by mouth daily at 12 noon.  . mupirocin ointment (BACTROBAN) 2 % Place 1 application into the nose 2 (two) times daily.  . sodium chloride (OCEAN) 0.65 % SOLN nasal spray Place 1 spray into both nostrils as needed for congestion.  . tamsulosin (FLOMAX) 0.4 MG CAPS capsule Take 1 capsule (0.4 mg total) by mouth every evening.  . traZODone (DESYREL) 100 MG tablet Take 1 tablet (100 mg total) by mouth at bedtime.  Marland Kitchen umeclidinium bromide (INCRUSE ELLIPTA) 62.5 MCG/INH AEPB Inhale 1 puff into the lungs daily.   No facility-administered encounter medications on  file as of 09/22/2016.      Review of Systems  Constitutional:   No  weight loss, night sweats,  Fevers, chills,  +fatigue, or  lassitude.  HEENT:   No headaches,  Difficulty swallowing,  Tooth/dental problems, or  Sore throat,                No sneezing, itching, ear ache, nasal congestion, post nasal drip,   CV:  No chest pain,  Orthopnea, PND, swelling in lower extremities, anasarca, dizziness, palpitations, syncope.   GI  No heartburn, indigestion, abdominal pain, nausea, vomiting, diarrhea, change in bowel habits, loss of appetite, bloody stools.   Resp:    No chest wall deformity  Skin: no rash or lesions.  GU: no dysuria, change in color of urine, no urgency or frequency.  No flank pain, no hematuria   MS:  No joint pain or swelling.  No decreased range of motion.  No back pain.    Physical Exam  BP 100/62 (BP Location: Left Arm, Patient Position: Sitting, Cuff Size: Normal)   Pulse 82   Ht 5' 10.5" (1.791 m)   Wt 185 lb 4 oz (84 kg)   SpO2 92%   BMI 26.20 kg/m   GEN: A/Ox3; pleasant , NAD, elderly on oxygen   HEENT:  [place]/AT,  EACs-clear, TMs-wnl, NOSE-clear, THROAT-clear, no lesions, no postnasal drip or exudate noted.   NECK:  Supple w/ fair ROM; no JVD; normal carotid impulses w/o bruits; no thyromegaly or nodules palpated; no lymphadenopathy.    RESP  diminished breath sounds in the bases . no accessory muscle use, no dullness to percussion  CARD:  RRR, no m/r/g, trace peripheral edema, pulses intact, no cyanosis or clubbing.  GI:   Soft & nt; nml bowel sounds; no organomegaly or masses detected.   Musco: Warm bil, no deformities or joint swelling noted.   Neuro: alert, no focal deficits noted.    Skin: Warm, no lesions or rashes    Lab Results:  CBC    Component Value Date/Time   WBC 7.1 07/14/2016 1648   RBC 4.74 07/14/2016 1648   HGB 13.5 07/14/2016 1648   HCT 41.9 07/14/2016 1648   PLT 235.0 07/14/2016 1648   MCV 88.6 07/14/2016 1648    MCH 27.7 05/12/2016 0618   MCHC 32.2 07/14/2016 1648   RDW 17.5 (H) 07/14/2016 1648   LYMPHSABS 1.4 07/14/2016 1648   MONOABS 0.7 07/14/2016 1648   EOSABS 0.4 07/14/2016 1648   BASOSABS 0.1 07/14/2016 1648    BMET    Component Value Date/Time   NA 137 07/14/2016 1648   K 3.9 07/14/2016 1648   CL 102 07/14/2016 1648   CO2 28 07/14/2016 1648   GLUCOSE 101 (H) 07/14/2016 1648   BUN  20 07/14/2016 1648   CREATININE 0.76 07/14/2016 1648   CALCIUM 9.3 07/14/2016 1648   GFRNONAA >60 05/12/2016 0618   GFRAA >60 05/12/2016 0618    BNP    Component Value Date/Time   BNP 187.2 (H) 05/10/2016 1405    ProBNP No results found for: PROBNP  Imaging: No results found.   Assessment & Plan:   COPD (chronic obstructive pulmonary disease) ([place]) Compensated on present regimen without flare.  Plan  Patient Instructions  Continue on Oxygen 2l/m rest , 15l/ walking/exercise.  Change oxygen device as needed to keep Oxygen level >88-90%.  Continue on Advair and Incruse.  Follow up with Dr Lake Bells in 2-3 months and As needed       Postinflammatory pulmonary fibrosis ([place]) Stable w/out flare   Plan  Patient Instructions  Continue on Oxygen 2l/m rest , 15l/ walking/exercise.  Change oxygen device as needed to keep Oxygen level >88-90%.  Continue on Advair and Incruse.  Follow up with Dr Lake Bells in 2-3 months and As needed        Chronic respiratory failure ([place]) Cont on O2  Keep sat >88-90%  Plan  Patient Instructions  Continue on Oxygen 2l/m rest , 15l/ walking/exercise.  Change oxygen device as needed to keep Oxygen level >88-90%.  Continue on Advair and Incruse.  Follow up with Dr Lake Bells in 2-3 months and As needed          Rexene Edison, NP 09/22/2016

## 2016-09-26 ENCOUNTER — Telehealth: Payer: Self-pay | Admitting: Internal Medicine

## 2016-09-26 ENCOUNTER — Encounter: Payer: Self-pay | Admitting: Family Medicine

## 2016-09-26 ENCOUNTER — Ambulatory Visit (INDEPENDENT_AMBULATORY_CARE_PROVIDER_SITE_OTHER): Payer: Medicare Other | Admitting: Family Medicine

## 2016-09-26 DIAGNOSIS — G8929 Other chronic pain: Secondary | ICD-10-CM

## 2016-09-26 DIAGNOSIS — M5412 Radiculopathy, cervical region: Secondary | ICD-10-CM | POA: Diagnosis not present

## 2016-09-26 DIAGNOSIS — M25511 Pain in right shoulder: Secondary | ICD-10-CM

## 2016-09-26 NOTE — Patient Instructions (Addendum)
Thank you for coming in,   You can consider adding another dose of gabapentin during the day. Please make sure that this does not make him too drowsy. We could add a muscle relaxer but again this can make him drowsy which can be dangerous if he lives alone.   Please feel free to call with any questions or concerns at any time, at [phone]. --Dr. Raeford Razor

## 2016-09-26 NOTE — Progress Notes (Signed)
Bradley Bennett - 83 y.o. male MRN [id number]  Date of birth: [birth year]  SUBJECTIVE:  Including CC & ROS.  Chief Complaint  Patient presents with  . Back Pain    patient is in such pain that he needed to make a sooner appointment to be seen than his follow up with Dr. Tamala Julian    Bradley Bennett is an 83 year old male that is presenting with right sided shoulder/neck pain. He reports the pain starts in his shoulder and radiates distally to his elbow. He reports his pain is significant in nature not controlled with his current medications. The pain is acute on chronic in nature. He reports the pain is worse with lying down. He feels like the pain is coming from his shoulder instead of his neck. He denies any prior injury or surgery to his right shoulder. The pain is throbbing in nature. He denies any numbness or weakness. Partial history was provided by his daughter. She reports that he has to left his oxygen containers which could exacerbate his pain from time to time.   I have independently reviewed his MRI of the cervical spine on 08/26/2015 showing chronic cerebral findings and mild cervical spine degeneration and mild multifactorial spinal stenosis at C3-C4 and C4-C5. There is moderate to severe neural foraminal stenosis at C4-C7. Does not have any imaging of his shoulders and his chart. He last saw Dr. Tamala Julian on 8/9 for cervical radiculopathy. Selected to perform conservative therapy based on his comorbidities. He is currently taking gabapentin. He does have a history of Parkinson's disease.  Review of Systems  Musculoskeletal: Positive for myalgias and neck pain. Negative for back pain.  Skin: Negative for rash.    HISTORY: Past Medical, Surgical, Social, and Family History Reviewed & Updated per EMR.   Pertinent Historical Findings include:  Past Medical History:  Diagnosis Date  . Arthritis   . Asthma   . Coronary artery disease   . Emphysema of lung ([place])   . Hearing difficulty of both  ears   . Heart attack ([place])   . Heart murmur   . Hyperlipidemia   . Hypertension   . ILD (interstitial lung disease) ([place])   . Parkinson disease [facility])     Past Surgical History:  Procedure Laterality Date  . CARPAL TUNNEL RELEASE Right 03/2014  . CORONARY ANGIOPLASTY WITH STENT PLACEMENT    . CORONARY ARTERY BYPASS GRAFT    . HERNIA REPAIR  10/2013   x3   . VEIN BYPASS SURGERY      Allergies  Allergen Reactions  . Isosorbide Other (See Comments)    Reaction:  Headaches     Family History  Problem Relation Age of Onset  . Colon cancer Father   . Stroke Father   . Arthritis Mother   . Arthritis Sister   . Heart disease Brother   . Kidney cancer Brother      Social History   Social History  . Marital status: Divorced    Spouse name: N/A  . Number of children: N/A  . Years of education: N/A   Occupational History  . retired     [place] History Main Topics  . Smoking status: Former Smoker    Packs/day: 2.00    Years: 35.00    Quit date: 08/15/1985  . Smokeless tobacco: Never Used     Comment: quit smoking in 1992  . Alcohol use 0.0 oz/week     Comment: twice every 6 months  .  Drug use: No  . Sexual activity: No   Other Topics Concern  . Not on file   Social History Narrative  . No narrative on file     PHYSICAL EXAM:  VS: BP 122/80 (BP Location: Left Arm, Patient Position: Sitting, Cuff Size: Normal)   Pulse 95   Ht 5' 10.5" (1.791 m)   Wt 186 lb (84.4 kg)   SpO2 93%   BMI 26.31 kg/m  Physical Exam Gen: NAD, alert, cooperative with exam,  ENT: normal lips, normal nasal mucosa,  Eye: normal EOM, normal conjunctiva and lids CV:  no edema, +2 pedal pulses   Resp: no accessory muscle use, [place] in place,  Skin: no rashes, no areas of induration  Neuro: normal tone, normal sensation to touch Psych:  normal insight, alert and oriented MSK:  Right shoulder: No tenderness to palpation of the acromioclavicular joint. Normal active flexion and  abduction. Normal external rotation, normal internal rotation. Normal external rotation with abduction. Normal strength to resistance and internal and external rotation. Normal empty can testing. Normal Hawkin's testing. Neurovascularly intact.    Aspiration/Injection Procedure Note Bradley Bennett [birth year]  Procedure: Injection Indications: Right shoulder pain  Procedure Details Consent: Risks of procedure as well as the alternatives and risks of each were explained to the (patient/caregiver).  Consent for procedure obtained. Time Out: Verified patient identification, verified procedure, site/side was marked, verified correct patient position, special equipment/implants available, medications/allergies/relevent history reviewed, required imaging and test results available.  Performed.  The area was cleaned with iodine and alcohol swabs.    The right humeral joint was injected using 1 cc's of 40 mg Kenalog and 4 cc's of 0.5% Marcaine with a 25 1 1/2" needle.  Ultrasound was used. Images were obtained in Transverse views showing the injection.    A sterile dressing was applied.  Patient did tolerate procedure well.       ASSESSMENT & PLAN:   Chronic right shoulder pain Possible that his symptoms are coming from his shoulder. Hard to discern based on his exam and his history. Has not had any imaging provided for her shoulder. - Intra-articular injection. - Follow-up as needed.  Cervical radiculopathy Seems that his pain is likely related to his cervical spine. Pain seems be significant and bothering him quite a bit. - Have him increase gabapentin to twice a day - I have sent a staff message to Dr. Carles Collet to see if she is agreeable to possibly adding baclofen to his regimen - Could consider epidural injections in the future. He is on Eliquis however

## 2016-09-26 NOTE — Telephone Encounter (Signed)
Spoke to pt's daughter. Advised her Dr Tamala Julian does not have any openings today but she did okay to schedule pt with Dr Raeford Razor.

## 2016-09-26 NOTE — Telephone Encounter (Signed)
Pt daughter called in and said that pt is still in pain and would like to know if Dr Tamala Julian could work him in asap?

## 2016-09-26 NOTE — Assessment & Plan Note (Addendum)
Seems that his pain is likely related to his cervical spine. Pain seems be significant and bothering him quite a bit. - Have him increase gabapentin to twice a day - I have sent a staff message to Dr. Carles Collet to see if she is agreeable to possibly adding baclofen to his regimen - Could consider epidural injections in the future. He is on Eliquis however

## 2016-09-26 NOTE — Assessment & Plan Note (Signed)
Possible that his symptoms are coming from his shoulder. Hard to discern based on his exam and his history. Has not had any imaging provided for her shoulder. - Intra-articular injection. - Follow-up as needed.

## 2016-09-28 ENCOUNTER — Encounter (HOSPITAL_COMMUNITY)
Admission: RE | Admit: 2016-09-28 | Discharge: 2016-09-28 | Disposition: A | Discharge status: Home / Self Care | Payer: Self-pay | Source: Ambulatory Visit | Attending: Pulmonary Disease | Admitting: Pulmonary Disease

## 2016-09-28 DIAGNOSIS — D099 Carcinoma in situ, unspecified: Secondary | ICD-10-CM | POA: Diagnosis not present

## 2016-10-02 NOTE — Progress Notes (Signed)
Reviewed, agree 

## 2016-10-03 ENCOUNTER — Encounter (HOSPITAL_COMMUNITY)
Admission: RE | Admit: 2016-10-03 | Discharge: 2016-10-03 | Disposition: A | Discharge status: Home / Self Care | Payer: Self-pay | Source: Ambulatory Visit | Attending: Pulmonary Disease | Admitting: Pulmonary Disease

## 2016-10-05 ENCOUNTER — Ambulatory Visit (INDEPENDENT_AMBULATORY_CARE_PROVIDER_SITE_OTHER): Payer: Medicare Other | Admitting: Family Medicine

## 2016-10-05 ENCOUNTER — Encounter: Payer: Self-pay | Admitting: Family Medicine

## 2016-10-05 ENCOUNTER — Encounter (HOSPITAL_COMMUNITY)
Admission: RE | Admit: 2016-10-05 | Discharge: 2016-10-05 | Disposition: A | Discharge status: Home / Self Care | Payer: Self-pay | Source: Ambulatory Visit | Attending: Internal Medicine | Admitting: Internal Medicine

## 2016-10-05 DIAGNOSIS — I2510 Atherosclerotic heart disease of native coronary artery without angina pectoris: Secondary | ICD-10-CM | POA: Diagnosis not present

## 2016-10-05 DIAGNOSIS — M5412 Radiculopathy, cervical region: Secondary | ICD-10-CM | POA: Diagnosis not present

## 2016-10-05 DIAGNOSIS — M25511 Pain in right shoulder: Secondary | ICD-10-CM | POA: Insufficient documentation

## 2016-10-05 MED ORDER — GABAPENTIN 400 MG PO CAPS: 400.0000 mg | ORAL_CAPSULE | Freq: Every day | ORAL | 3 refills | Status: DC

## 2016-10-05 NOTE — Progress Notes (Signed)
[facility] [address] [place], [place] [address] Phone: [phone] Subjective:    I'm seeing this patient by the request  of:  [name], MD   CC: Right shoulder pain f/u   [id number]  Bradley Bennett is a 83 y.o. male coming in with complaint of shoulder pain. Past medical history significant for Parkinson's and dependence on oxygen.Patient continued to have the right shoulder pain. Patient was started on gabapentin with very minimal benefit. Started having increasing pain. Somnolent provider 2 weeks ago and was given an injection in the right shoulder. No significant improvement. If anything the pain continues to worsen. Seems to be now more located in his neck and can radiate down the arm some. Patient states waking him up at night, unbearable. Rates the severity pain as 9 out of 10.      Past Medical History:  Diagnosis Date  . Arthritis   . Asthma   . Coronary artery disease   . Emphysema of lung ([place])   . Hearing difficulty of both ears   . Heart attack ([place])   . Heart murmur   . Hyperlipidemia   . Hypertension   . ILD (interstitial lung disease) ([place])   . Parkinson disease [facility])    Past Surgical History:  Procedure Laterality Date  . CARPAL TUNNEL RELEASE Right 03/2014  . CORONARY ANGIOPLASTY WITH STENT PLACEMENT    . CORONARY ARTERY BYPASS GRAFT    . HERNIA REPAIR  10/2013   x3   . VEIN BYPASS SURGERY     Social History   Social History  . Marital status: Divorced    Spouse name: N/A  . Number of children: N/A  . Years of education: N/A   Occupational History  . retired     [place] History Main Topics  . Smoking status: Former Smoker    Packs/day: 2.00    Years: 35.00    Quit date: 08/15/1985  . Smokeless tobacco: Never Used     Comment: quit smoking in 1992  . Alcohol use 0.0 oz/week     Comment: twice every 6 months  . Drug use: No  . Sexual activity: No   Other Topics Concern  . None   Social  History Narrative  . None   Allergies  Allergen Reactions  . Isosorbide Other (See Comments)    Reaction:  Headaches    Family History  Problem Relation Age of Onset  . Colon cancer Father   . Stroke Father   . Arthritis Mother   . Arthritis Sister   . Heart disease Brother   . Kidney cancer Brother      Past medical history, social, surgical and family history all reviewed in electronic medical record.  No pertanent information unless stated regarding to the chief complaint.   Review of Systems:Review of systems updated and as accurate as of 10/05/16  No headache, visual changes, nausea, vomiting, diarrhea, constipation, dizziness, abdominal pain, skin rash, fevers, chills, night sweats, weight loss, swollen lymph nodes, body aches, joint swelling, muscle aches, chest pain, shortness of breath, mood changes. Positive muscle aches  Objective  Blood pressure 130/74, pulse 90, height 5' 10.5" (1.791 m), weight 188 lb (85.3 kg), SpO2 92 %. Systems examined below as of 10/05/16   General: No apparent distress alert and oriented x3 mood and affect normal, dressed appropriately. Masked facies  HEENT: Pupils equal, extraocular movements intact  Respiratory: Patient's speak in full  sentences and does not appear short of breath  Cardiovascular: No lower extremity edema, non tender, no erythema  Skin: Warm dry intact with no signs of infection or rash on extremities or on axial skeleton.  Abdomen: Soft nontender  Neuro: Cranial nerves II through XII are intact, neurovascularly intact in all extremities with 2+ DTRs and 2+ pulses.  Lymph: No lymphadenopathy of posterior or anterior cervical chain or axillae bilaterally.  Gait normal with good balance and coordination.  MSK:  Non tender with full range of motion and good stability and symmetric strength and tone of  elbows, wrist, hip, knee and ankles bilaterally. Severe arthritic changes of multiple joints  Shoulder: Right Inspection  revealscatching noted diffuse tenderness even to light palpation  ROM is full in all planes passively. Rotator cuff strength4 out of 5 but symmetric to contralateral side  signs of impingement with positive Neer and Hawkin's tests, but negative empty can sign. Severe tightness in the trapezius and paraspinal musculature of the cervical spine with multiple trigger points Contralateral shoulder has some arthritic changes as well.  Neck: Inspection loss of lordosis. No palpable stepoffs. Positive Spurling's maneuver. Patient has significant limitation in range of motion in all planes especially and extension of only 5. Grip strength and sensation normal in bilateral hands Strength good C4 to T1 distribution No sensory change to C4 to T1 Negative Hoffman sign bilaterally Reflexes normal  After verbal consent patient was prepped with alcohol swabs and with a 25-gauge half-inch heel was injected in plantar distinct trigger points in the right trapezius muscle. No blood loss. Total of 4 mL of 0.5% Marcaine and 1 mL of Kenalog 40 mg/dL use. Post injection instructions given     Impression and Recommendations:     This case required medical decision making of moderate complexity.      Note: This dictation was prepared with Dragon dictation along with smaller phrase technology. Any transcriptional errors that result from this process are unintentional.

## 2016-10-05 NOTE — Patient Instructions (Addendum)
Good to see you  Bradley Bennett is your friend.  We tried injection trigger points  Gabapentin 100mg  in AM, 100in PM and 400mg  at night  Traction could be good.  Try the brace from time to time as well.  In 1 week call me and if not better we will need to talk about getting cards to hold the plavix.

## 2016-10-05 NOTE — Assessment & Plan Note (Signed)
I believe the patient's underlying problem is secondary to more to cervical radiculopathy. MRI does show some potential nerve root impingement. Unfortunately with patient's other comorbidities she would be a high risk for potential epidural. Patient would have to discontinue his Plavix. Patient does not want to do that at this moment. I agree. We tried trigger point injections today to see if I will give him some relief. Increase gabapentin to 400 mg at night and continue 100 mg in the a.m. and p.m. Discussed icing regimen. Follow-up again in 2 weeks.

## 2016-10-05 NOTE — Assessment & Plan Note (Signed)
Given injection today for symptom relief. Likely more radicular symptoms is contribute into it. Hopefully be increasing gabapentin will be beneficial. Discussed posture ergonomics and proper lifting mechanics. Follow-up in 2 weeks

## 2016-10-09 ENCOUNTER — Other Ambulatory Visit: Payer: Self-pay | Admitting: Internal Medicine

## 2016-10-09 ENCOUNTER — Other Ambulatory Visit: Payer: Self-pay | Admitting: Neurology

## 2016-10-10 ENCOUNTER — Emergency Department (HOSPITAL_COMMUNITY): Payer: Medicare Other

## 2016-10-10 ENCOUNTER — Encounter (HOSPITAL_COMMUNITY): Payer: Self-pay | Admitting: Emergency Medicine

## 2016-10-10 ENCOUNTER — Emergency Department (HOSPITAL_COMMUNITY)
Admission: EM | Admit: 2016-10-10 | Discharge: 2016-10-10 | Disposition: A | Discharge status: Home / Self Care | Payer: Medicare Other | Attending: Emergency Medicine | Admitting: Emergency Medicine

## 2016-10-10 DIAGNOSIS — R092 Respiratory arrest: Secondary | ICD-10-CM

## 2016-10-10 DIAGNOSIS — Z951 Presence of aortocoronary bypass graft: Secondary | ICD-10-CM | POA: Insufficient documentation

## 2016-10-10 DIAGNOSIS — J449 Chronic obstructive pulmonary disease, unspecified: Secondary | ICD-10-CM | POA: Insufficient documentation

## 2016-10-10 DIAGNOSIS — Z87891 Personal history of nicotine dependence: Secondary | ICD-10-CM | POA: Insufficient documentation

## 2016-10-10 DIAGNOSIS — I2510 Atherosclerotic heart disease of native coronary artery without angina pectoris: Secondary | ICD-10-CM | POA: Diagnosis not present

## 2016-10-10 DIAGNOSIS — G20 Parkinson's disease: Secondary | ICD-10-CM | POA: Insufficient documentation

## 2016-10-10 DIAGNOSIS — R0602 Shortness of breath: Secondary | ICD-10-CM | POA: Diagnosis not present

## 2016-10-10 DIAGNOSIS — T148XXA Other injury of unspecified body region, initial encounter: Secondary | ICD-10-CM | POA: Diagnosis not present

## 2016-10-10 DIAGNOSIS — R7303 Prediabetes: Secondary | ICD-10-CM | POA: Insufficient documentation

## 2016-10-10 DIAGNOSIS — I10 Essential (primary) hypertension: Secondary | ICD-10-CM | POA: Insufficient documentation

## 2016-10-10 DIAGNOSIS — R0603 Acute respiratory distress: Secondary | ICD-10-CM | POA: Diagnosis not present

## 2016-10-10 DIAGNOSIS — Z79899 Other long term (current) drug therapy: Secondary | ICD-10-CM | POA: Insufficient documentation

## 2016-10-10 DIAGNOSIS — S51019A Laceration without foreign body of unspecified elbow, initial encounter: Secondary | ICD-10-CM | POA: Diagnosis not present

## 2016-10-10 DIAGNOSIS — E785 Hyperlipidemia, unspecified: Secondary | ICD-10-CM | POA: Diagnosis not present

## 2016-10-10 LAB — CBC WITH DIFFERENTIAL/PLATELET
BASOS PCT: 0 %
Basophils Absolute: 0 10*3/uL (ref 0.0–0.1)
EOS PCT: 3 %
Eosinophils Absolute: 0.3 10*3/uL (ref 0.0–0.7)
HEMATOCRIT: 40.3 % (ref 39.0–52.0)
Hemoglobin: 12.8 g/dL — ABNORMAL LOW (ref 13.0–17.0)
Lymphocytes Relative: 9 %
Lymphs Abs: 0.9 10*3/uL (ref 0.7–4.0)
MCH: 28.3 pg (ref 26.0–34.0)
MCHC: 31.8 g/dL (ref 30.0–36.0)
MCV: 89.2 fL (ref 78.0–100.0)
MONO ABS: 0.8 10*3/uL (ref 0.1–1.0)
MONOS PCT: 8 %
Neutro Abs: 8 10*3/uL — ABNORMAL HIGH (ref 1.7–7.7)
Neutrophils Relative %: 80 %
PLATELETS: 233 10*3/uL (ref 150–400)
RBC: 4.52 MIL/uL (ref 4.22–5.81)
RDW: 17.3 % — AB (ref 11.5–15.5)
WBC: 10 10*3/uL (ref 4.0–10.5)

## 2016-10-10 LAB — BASIC METABOLIC PANEL
ANION GAP: 9 (ref 5–15)
BUN: 16 mg/dL (ref 6–20)
CALCIUM: 8.8 mg/dL — AB (ref 8.9–10.3)
CO2: 24 mmol/L (ref 22–32)
Chloride: 105 mmol/L (ref 101–111)
Creatinine, Ser: 0.93 mg/dL (ref 0.61–1.24)
GFR calc Af Amer: >60 mL/min (ref >60)
GFR calc non Af Amer: >60 mL/min (ref >60)
GLUCOSE: 145 mg/dL — AB (ref 65–99)
Potassium: 3.8 mmol/L (ref 3.5–5.1)
Sodium: 138 mmol/L (ref 135–145)

## 2016-10-10 NOTE — Progress Notes (Addendum)
RT arrived to draw an ABG per order. Pt on a [place] 15 liters, sat low 80's.  RN states pt wears 8-9 L at home. RT set pt on a [place] HFNC 15 L with humidity at 1415.  Pt's sat improved quickly to 93%.  RT checking on pt again, Liter flow now at 9, sat 94%. Pt tolerating well at this time.
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
## 2016-10-10 NOTE — ED Notes (Signed)
PT switched to respiratory's larger bore nasal cannula and humidifier; sats dramatically increased on 15 L [place].

## 2016-10-10 NOTE — ED Provider Notes (Addendum)
[facility] DEPT Provider Note   CSN: [id number] Arrival date & time: 10/10/16  1318     History   Chief Complaint Chief Complaint  Patient presents with  . Respiratory Arrest    HPI Bradley Bennett is a 83 y.o. male.patient became unconscious today after he ran out of his oxygen while doing errands. He was found by EMS to be unresponsiveagonal respirations. EMS reports that he vomited his respirations were assisted with bag valve mask EMS placed him on supplemental oxygenusing nonrebreather mask and he is presently asymptomatic.he denies any shortness of breath.states he is breathing at baseline.denies nausea at present. Denies pain anywhere  HPI  Past Medical History:  Diagnosis Date  . Arthritis   . Asthma   . Coronary artery disease   . Emphysema of lung ([place])   . Hearing difficulty of both ears   . Heart attack ([place])   . Heart murmur   . Hyperlipidemia   . Hypertension   . ILD (interstitial lung disease) ([place])   . Parkinson disease [facility])     Patient Active Problem List   Diagnosis Date Noted  . Trigger point of right shoulder region 10/05/2016  . Cervical radiculopathy 09/15/2016  . Chronic respiratory failure ([place]) 08/21/2016  . Chronic right shoulder pain 08/21/2016  . Hypotension 07/15/2016  . Diarrhea 07/14/2016  . Prediabetes 07/13/2016  . Dyspnea 06/15/2016  . Allergic rhinitis 05/25/2016  . COPD exacerbation ([place]) 05/10/2016  . Thrush 01/15/2016  . Trigger thumb of both hands 01/15/2016  . COPD (chronic obstructive pulmonary disease) ([place]) 07/16/2015  . Hypoxemia 07/16/2015  . Postinflammatory pulmonary fibrosis ([place]) 07/16/2015  . Parkinson disease ([place]) 07/16/2015  . Hypothyroidism 07/16/2015  . Insomnia 07/16/2015  . BPH (benign prostatic hypertrophy) 07/16/2015  . Anxiety 07/16/2015  . Carpal tunnel syndrome, right 07/16/2015  . Chronic lower back pain 07/16/2015  . Macular degeneration 07/16/2015  . CAD in native artery 06/15/2015  .  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 S/P coronary artery stent placement 06/15/2015  . Hyperlipidemia 06/15/2015    Past Surgical History:  Procedure Laterality Date  . CARPAL TUNNEL RELEASE Right 03/2014  . CORONARY ANGIOPLASTY WITH STENT PLACEMENT    . CORONARY ARTERY BYPASS GRAFT    . HERNIA REPAIR  10/2013   x3   . VEIN BYPASS SURGERY         Home Medications    Prior to Admission medications   Medication Sig Start Date End Date Taking? Authorizing Provider  acetaminophen (TYLENOL) 500 MG tablet Take 500-1,000 mg by mouth every 6 (six) hours as needed for mild pain, moderate pain, fever or headache.     [provider]  atorvastatin (LIPITOR) 20 MG tablet Take 1 tablet (20 mg total) by mouth daily. 02/08/16   Sueanne Margarita, MD  busPIRone (BUSPAR) 5 MG tablet Take 1 tablet (5 mg total) by mouth 2 (two) times daily. 07/14/16   [name], MD  carbidopa-levodopa (SINEMET CR) 50-200 MG tablet TAKE ONE TABLET BY MOUTH AT BEDTIME 10/10/16   Tat, Eustace Quail, DO  carbidopa-levodopa (SINEMET IR) 25-100 MG tablet TAKE TWO TABLETS BY MOUTH THREE TIMES A DAY 10/10/16   Tat, Eustace Quail, DO  clopidogrel (PLAVIX) 75 MG tablet TAKE ONE TABLET BY MOUTH DAILY 10/10/16   [name], MD  finasteride (PROSCAR) 5 MG tablet Take 1 tablet (5 mg total) by mouth every evening. 05/12/16   Donita Brooks, NP  fluticasone (FLONASE) 50 MCG/ACT nasal spray  Place 1 spray into both nostrils daily. 05/25/16   Juanito Doom, MD  Fluticasone-Salmeterol (ADVAIR DISKUS) 500-50 MCG/DOSE AEPB Inhale 1 puff into the lungs 2 (two) times daily. 05/25/16   Juanito Doom, MD  gabapentin (NEURONTIN) 100 MG capsule Take 2 capsules (200 mg total) by mouth at bedtime. 09/14/16   Lyndal Pulley, DO  gabapentin (NEURONTIN) 400 MG capsule Take 1 capsule (400 mg total) by mouth at bedtime. 10/05/16   Lyndal Pulley, DO  levothyroxine (SYNTHROID, LEVOTHROID) 150 MCG tablet TAKE ONE TABLET BY MOUTH DAILY 10/10/16   [name],  MD  mirabegron ER (MYRBETRIQ) 50 MG TB24 tablet Take 50 mg by mouth every evening.    [provider]  Multiple Vitamin (MULTIVITAMIN WITH MINERALS) TABS tablet Take 1 tablet by mouth daily at 12 noon.    [provider]  mupirocin ointment (BACTROBAN) 2 % Place 1 application into the nose 2 (two) times daily.    [provider]  sodium chloride (OCEAN) 0.65 % SOLN nasal spray Place 1 spray into both nostrils as needed for congestion. 05/12/16   Donita Brooks, NP  tamsulosin (FLOMAX) 0.4 MG CAPS capsule Take 1 capsule (0.4 mg total) by mouth every evening. 05/12/16   Donita Brooks, NP  traZODone (DESYREL) 100 MG tablet Take 1 tablet (100 mg total) by mouth at bedtime. 05/25/16   Juanito Doom, MD  umeclidinium bromide (INCRUSE ELLIPTA) 62.5 MCG/INH AEPB Inhale 1 puff into the lungs daily. 05/25/16   Juanito Doom, MD    Family History Family History  Problem Relation Age of Onset  . Colon cancer Father   . Stroke Father   . Arthritis Mother   . Arthritis Sister   . Heart disease Brother   . Kidney cancer Brother     Social History Social History  Substance Use Topics  . Smoking status: Former Smoker    Packs/day: 2.00    Years: 35.00    Quit date: 08/15/1985  . Smokeless tobacco: Never Used     Comment: quit smoking in 1992  . Alcohol use 0.0 oz/week     Comment: twice every 6 months     Allergies   Isosorbide   Review of Systems Review of Systems  Constitutional: Negative.   HENT: Negative.   Respiratory:       Chronic dyspnea  Cardiovascular: Negative.   Gastrointestinal: Positive for vomiting.  Musculoskeletal: Negative.   Skin: Negative.   Neurological: Negative.   Psychiatric/Behavioral: Negative.      Physical Exam Updated Vital Signs BP 100/62 (BP Location: Right Arm)   Pulse (!) 101   Temp 98.3 F (36.8 C) (Oral)   Resp 20   SpO2 (!) 85%   Physical Exam  Constitutional: He appears well-developed and  well-nourished.  HENT:  Head: Normocephalic and atraumatic.  Eyes: Pupils are equal, round, and reactive to light. Conjunctivae are normal.  Neck: Neck supple. No tracheal deviation present. No thyromegaly present.  Cardiovascular: Normal rate and regular rhythm.   No murmur heard. Pulmonary/Chest: Effort normal. No respiratory distress.  Scant dry crackles posteriorly  Abdominal: Soft. Bowel sounds are normal. He exhibits no distension. There is no tenderness.  Musculoskeletal: Normal range of motion. He exhibits no edema or tenderness.  Neurological: He is alert. Coordination normal.  Skin: Skin is warm and dry. No rash noted.  Psychiatric: He has a normal mood and affect.  Nursing note and vitals reviewed.    ED  Treatments / Results  Labs (all labs ordered are listed, but only abnormal results are displayed) Labs Reviewed  BASIC METABOLIC PANEL  CBC WITH DIFFERENTIAL/PLATELET  BLOOD GAS, ARTERIAL    EKG  EKG Interpretation  Date/Time:  Tuesday October 10 2016 13:29:18 EDT Ventricular Rate:  99 PR Interval:    QRS Duration: 101 QT Interval:  341 QTC Calculation: 438 R Axis:   66 Text Interpretation:  Sinus rhythm Low voltage, precordial leads Borderline T abnormalities, anterior leads No significant change since last tracing Confirmed by Orlie Dakin [phone]) on 10/10/2016 1:51:59 PM      Chest x-ray viewed by me Results for orders placed or performed during the hospital encounter of 73/22/02  Basic metabolic panel  Result Value Ref Range   Sodium 138 135 - 145 mmol/L   Potassium 3.8 3.5 - 5.1 mmol/L   Chloride 105 101 - 111 mmol/L   CO2 24 22 - 32 mmol/L   Glucose, Bld 145 (H) 65 - 99 mg/dL   BUN 16 6 - 20 mg/dL   Creatinine, Ser 0.93 0.61 - 1.24 mg/dL   Calcium 8.8 (L) 8.9 - 10.3 mg/dL   GFR calc non Af Amer >60 >60 mL/min   GFR calc Af Amer >60 >60 mL/min   Anion gap 9 5 - 15  CBC with Differential/Platelet  Result Value Ref Range   WBC 10.0 4.0 - 10.5  K/uL   RBC 4.52 4.22 - 5.81 MIL/uL   Hemoglobin 12.8 (L) 13.0 - 17.0 g/dL   HCT 40.3 39.0 - 52.0 %   MCV 89.2 78.0 - 100.0 fL   MCH 28.3 26.0 - 34.0 pg   MCHC 31.8 30.0 - 36.0 g/dL   RDW 17.3 (H) 11.5 - 15.5 %   Platelets 233 150 - 400 K/uL   Neutrophils Relative % 80 %   Neutro Abs 8.0 (H) 1.7 - 7.7 K/uL   Lymphocytes Relative 9 %   Lymphs Abs 0.9 0.7 - 4.0 K/uL   Monocytes Relative 8 %   Monocytes Absolute 0.8 0.1 - 1.0 K/uL   Eosinophils Relative 3 %   Eosinophils Absolute 0.3 0.0 - 0.7 K/uL   Basophils Relative 0 %   Basophils Absolute 0.0 0.0 - 0.1 K/uL   Dg Chest Port 1 View  Result Date: 10/10/2016 CLINICAL DATA:  Shortness of breath. EXAM: PORTABLE CHEST 1 VIEW COMPARISON:  Radiographs Jun 15, 2016. FINDINGS: Stable cardiomediastinal silhouette. Sternotomy wires are noted. Emphysematous disease is noted in the upper lobes bilaterally, with diffuse interstitial densities in both lungs which may be more prominent compared to prior exam. This is most consistent with underlying chronic interstitial lung disease or fibrosis, with possible superimposed edema or inflammation. No pneumothorax or pleural effusion is noted. Bony thorax is unremarkable. IMPRESSION: Stable bilateral upper lobe emphysematous disease is noted. Mildly increased diffuse interstitial densities are noted in both lower lobes concerning for chronic interstitial lung disease or pulmonary fibrosis with possible superimposed acute edema or inflammation. Electronically Signed   By: Marijo Conception, M.D.   On: 10/10/2016 14:18   Radiology No results found.  Procedures Procedures (including critical care time)  Medications Ordered in ED Medications - No data to display   Initial Impression / Assessment and Plan / ED Course  I have reviewed the triage vital signs and the nursing notes.  Pertinent labs & imaging results that were available during my care of the patient were reviewed by me and considered in my medical  decision  making (see chart for details).     4:25 PM patient is alert states "I'm doing incredibly well" he is in no respiratory distress speaks in paragraphs while on supplemental oxygen. pLan return as needed or follow-up with Dr. Curt Jews he'she encouraged to keep an adequate supply of oxygen at home at all times. Respiratory arrest felt secondary to patient's having run out of oxygen. He does have an adequate supply of oxygen at home Final Clinical Impressions(s) / ED Diagnoses  Dx respiratory arrest Final diagnoses:  None    New Prescriptions New Prescriptions   No medications on file     Orlie Dakin, MD 10/10/16 [place], MD 10/10/16 [phone]

## 2016-10-10 NOTE — Discharge Instructions (Signed)
Make sure that you have an adequate supply of oxygen at all times. Return if concern for any reason or see your physician

## 2016-10-12 ENCOUNTER — Telehealth: Payer: Self-pay | Admitting: Family Medicine

## 2016-10-12 ENCOUNTER — Encounter (HOSPITAL_COMMUNITY)
Admission: RE | Admit: 2016-10-12 | Discharge: 2016-10-12 | Disposition: A | Discharge status: Home / Self Care | Payer: Self-pay | Source: Ambulatory Visit | Attending: Pulmonary Disease | Admitting: Pulmonary Disease

## 2016-10-12 ENCOUNTER — Encounter: Payer: Self-pay | Admitting: Family Medicine

## 2016-10-12 DIAGNOSIS — J8410 Pulmonary fibrosis, unspecified: Secondary | ICD-10-CM | POA: Insufficient documentation

## 2016-10-12 DIAGNOSIS — M5412 Radiculopathy, cervical region: Secondary | ICD-10-CM

## 2016-10-12 NOTE — Telephone Encounter (Signed)
Spoke with Dr Tamala Julian. This will be handled by him and Ria Comment, CMA. Spoke with pts daughter to advise that someone will be in contact with her to schedule epidural.

## 2016-10-12 NOTE — Telephone Encounter (Signed)
Pt's daughter called wanting to speak with you regarding her father. She said that she was told to call back a week after his visit to let Dr Tamala Julian know how he was doing. She did not want to leave the message with me. She can be reached at [phone].

## 2016-10-12 NOTE — Telephone Encounter (Signed)
Spoke to pt's daughter. Advised her epidural has been ordered & sent to [facility].

## 2016-10-16 ENCOUNTER — Telehealth: Payer: Self-pay | Admitting: Cardiology

## 2016-10-16 NOTE — Telephone Encounter (Signed)
°  New Message   pt daughter verbalized that she is calling for rn   For Dr.Turner to sign off on pt to have an epidural   [facility]

## 2016-10-16 NOTE — Telephone Encounter (Signed)
Notified the pts Daughter that [place] should fax the epidural clearance to our office at [phone] attention it to Dr Radford Pax, or they can call it into our office at [phone], and this can directly be routed to Dr Radford Pax to review and advise on.  Per the Daughter, she will call [facility] tomorrow, and have this information either called or faxed into the office.  Daughter gracious for all the assistance provided. Will leave on triage desktop to follow-up on.

## 2016-10-17 ENCOUNTER — Ambulatory Visit (HOSPITAL_COMMUNITY): Payer: Medicare Other

## 2016-10-18 NOTE — Telephone Encounter (Signed)
Patient is low risk from cardiac standpoint for spine surgery.  OK to hold Plavix 7 days prior to surgery

## 2016-10-18 NOTE — Telephone Encounter (Signed)
Faxed to [facility] at [phone].

## 2016-10-18 NOTE — Telephone Encounter (Signed)
   [facility] Pre-operative Risk Assessment    Request for surgical clearance:  1. What type of surgery is being performed? Cervical ESI   2. When is this surgery scheduled? Pending clearance    3. Are there any medications that need to be held prior to surgery and how long? Plavix for 5 days   4. Name of physician performing surgery? [facility]  5. What is your office phone and fax number?  1. Phone: [phone] 2. Fax: [phone]   Theodoro Parma 10/18/2016, 10:58 AM  _________________________________________________________________   (provider comments below)

## 2016-10-19 ENCOUNTER — Encounter (HOSPITAL_COMMUNITY): Payer: Self-pay | Admitting: Emergency Medicine

## 2016-10-19 ENCOUNTER — Ambulatory Visit (HOSPITAL_COMMUNITY): Payer: Medicare Other

## 2016-10-19 ENCOUNTER — Emergency Department (HOSPITAL_COMMUNITY): Payer: Medicare Other

## 2016-10-19 ENCOUNTER — Inpatient Hospital Stay (HOSPITAL_COMMUNITY)
Admission: EM | Admit: 2016-10-19 | Discharge: 2016-10-24 | DRG: 871 | Disposition: A | Discharge status: Home Health | Payer: Medicare Other | Attending: Internal Medicine | Admitting: Internal Medicine

## 2016-10-19 DIAGNOSIS — R0902 Hypoxemia: Secondary | ICD-10-CM | POA: Diagnosis not present

## 2016-10-19 DIAGNOSIS — L97519 Non-pressure chronic ulcer of other part of right foot with unspecified severity: Secondary | ICD-10-CM | POA: Diagnosis present

## 2016-10-19 DIAGNOSIS — R400 Somnolence: Secondary | ICD-10-CM

## 2016-10-19 DIAGNOSIS — Z9981 Dependence on supplemental oxygen: Secondary | ICD-10-CM

## 2016-10-19 DIAGNOSIS — J449 Chronic obstructive pulmonary disease, unspecified: Secondary | ICD-10-CM | POA: Diagnosis not present

## 2016-10-19 DIAGNOSIS — R7881 Bacteremia: Secondary | ICD-10-CM

## 2016-10-19 DIAGNOSIS — I96 Gangrene, not elsewhere classified: Secondary | ICD-10-CM | POA: Diagnosis present

## 2016-10-19 DIAGNOSIS — Z87891 Personal history of nicotine dependence: Secondary | ICD-10-CM | POA: Diagnosis not present

## 2016-10-19 DIAGNOSIS — E876 Hypokalemia: Secondary | ICD-10-CM | POA: Diagnosis not present

## 2016-10-19 DIAGNOSIS — E038 Other specified hypothyroidism: Secondary | ICD-10-CM | POA: Diagnosis not present

## 2016-10-19 DIAGNOSIS — G629 Polyneuropathy, unspecified: Secondary | ICD-10-CM | POA: Diagnosis not present

## 2016-10-19 DIAGNOSIS — J9621 Acute and chronic respiratory failure with hypoxia: Secondary | ICD-10-CM | POA: Diagnosis not present

## 2016-10-19 DIAGNOSIS — I5030 Unspecified diastolic (congestive) heart failure: Secondary | ICD-10-CM | POA: Diagnosis not present

## 2016-10-19 DIAGNOSIS — E7800 Pure hypercholesterolemia, unspecified: Secondary | ICD-10-CM | POA: Diagnosis not present

## 2016-10-19 DIAGNOSIS — I252 Old myocardial infarction: Secondary | ICD-10-CM

## 2016-10-19 DIAGNOSIS — L0390 Cellulitis, unspecified: Secondary | ICD-10-CM | POA: Diagnosis present

## 2016-10-19 DIAGNOSIS — H9190 Unspecified hearing loss, unspecified ear: Secondary | ICD-10-CM | POA: Diagnosis present

## 2016-10-19 DIAGNOSIS — G20 Parkinson's disease: Secondary | ICD-10-CM | POA: Diagnosis not present

## 2016-10-19 DIAGNOSIS — J8410 Pulmonary fibrosis, unspecified: Secondary | ICD-10-CM | POA: Diagnosis not present

## 2016-10-19 DIAGNOSIS — R0600 Dyspnea, unspecified: Secondary | ICD-10-CM | POA: Diagnosis present

## 2016-10-19 DIAGNOSIS — E785 Hyperlipidemia, unspecified: Secondary | ICD-10-CM | POA: Diagnosis present

## 2016-10-19 DIAGNOSIS — I10 Essential (primary) hypertension: Secondary | ICD-10-CM | POA: Diagnosis present

## 2016-10-19 DIAGNOSIS — E039 Hypothyroidism, unspecified: Secondary | ICD-10-CM | POA: Diagnosis present

## 2016-10-19 DIAGNOSIS — A419 Sepsis, unspecified organism: Secondary | ICD-10-CM | POA: Diagnosis present

## 2016-10-19 DIAGNOSIS — I2510 Atherosclerotic heart disease of native coronary artery without angina pectoris: Secondary | ICD-10-CM | POA: Diagnosis present

## 2016-10-19 DIAGNOSIS — G92 Toxic encephalopathy: Secondary | ICD-10-CM | POA: Diagnosis present

## 2016-10-19 DIAGNOSIS — L03115 Cellulitis of right lower limb: Secondary | ICD-10-CM | POA: Diagnosis present

## 2016-10-19 DIAGNOSIS — R0609 Other forms of dyspnea: Secondary | ICD-10-CM | POA: Diagnosis not present

## 2016-10-19 DIAGNOSIS — A4101 Sepsis due to Methicillin susceptible Staphylococcus aureus: Secondary | ICD-10-CM | POA: Diagnosis not present

## 2016-10-19 DIAGNOSIS — R7303 Prediabetes: Secondary | ICD-10-CM | POA: Diagnosis present

## 2016-10-19 DIAGNOSIS — E871 Hypo-osmolality and hyponatremia: Secondary | ICD-10-CM | POA: Diagnosis present

## 2016-10-19 DIAGNOSIS — Z7902 Long term (current) use of antithrombotics/antiplatelets: Secondary | ICD-10-CM

## 2016-10-19 DIAGNOSIS — L03031 Cellulitis of right toe: Secondary | ICD-10-CM | POA: Diagnosis not present

## 2016-10-19 DIAGNOSIS — Z79899 Other long term (current) drug therapy: Secondary | ICD-10-CM | POA: Diagnosis not present

## 2016-10-19 DIAGNOSIS — Z951 Presence of aortocoronary bypass graft: Secondary | ICD-10-CM | POA: Diagnosis not present

## 2016-10-19 DIAGNOSIS — Z955 Presence of coronary angioplasty implant and graft: Secondary | ICD-10-CM | POA: Diagnosis not present

## 2016-10-19 DIAGNOSIS — I361 Nonrheumatic tricuspid (valve) insufficiency: Secondary | ICD-10-CM | POA: Diagnosis not present

## 2016-10-19 DIAGNOSIS — R0602 Shortness of breath: Secondary | ICD-10-CM

## 2016-10-19 LAB — URINALYSIS, ROUTINE W REFLEX MICROSCOPIC
BILIRUBIN URINE: NEGATIVE
Bacteria, UA: NONE SEEN
GLUCOSE, UA: NEGATIVE mg/dL
Hgb urine dipstick: NEGATIVE
KETONES UR: 5 mg/dL — AB
LEUKOCYTES UA: NEGATIVE
Nitrite: NEGATIVE
PROTEIN: 30 mg/dL — AB
Specific Gravity, Urine: 1.019 (ref 1.005–1.030)
Squamous Epithelial / LPF: NONE SEEN
pH: 5 (ref 5.0–8.0)

## 2016-10-19 LAB — CBC WITH DIFFERENTIAL/PLATELET
BASOS ABS: 0 10*3/uL (ref 0.0–0.1)
BASOS PCT: 0 %
Basophils Absolute: 0 10*3/uL (ref 0.0–0.1)
Basophils Relative: 0 %
EOS ABS: 0.1 10*3/uL (ref 0.0–0.7)
Eosinophils Absolute: 0 10*3/uL (ref 0.0–0.7)
Eosinophils Relative: 1 %
Eosinophils Relative: 2 %
HEMATOCRIT: 44.8 % (ref 39.0–52.0)
HEMATOCRIT: 41.2 % (ref 39.0–52.0)
HEMOGLOBIN: 14.3 g/dL (ref 13.0–17.0)
HEMOGLOBIN: 13.2 g/dL (ref 13.0–17.0)
LYMPHS PCT: 8 %
Lymphocytes Relative: 9 %
Lymphs Abs: 0.6 10*3/uL — ABNORMAL LOW (ref 0.7–4.0)
Lymphs Abs: 0.6 10*3/uL — ABNORMAL LOW (ref 0.7–4.0)
MCH: 28.6 pg (ref 26.0–34.0)
MCH: 28.7 pg (ref 26.0–34.0)
MCHC: 31.9 g/dL (ref 30.0–36.0)
MCHC: 32 g/dL (ref 30.0–36.0)
MCV: 89.6 fL (ref 78.0–100.0)
MCV: 89.6 fL (ref 78.0–100.0)
MONO ABS: 0.7 10*3/uL (ref 0.1–1.0)
MONOS PCT: 9 %
MONOS PCT: 11 %
Monocytes Absolute: 0.7 10*3/uL (ref 0.1–1.0)
NEUTROS ABS: 6 10*3/uL (ref 1.7–7.7)
NEUTROS PCT: 82 %
NEUTROS PCT: 78 %
Neutro Abs: 5.2 10*3/uL (ref 1.7–7.7)
Platelets: 284 10*3/uL (ref 150–400)
Platelets: 250 10*3/uL (ref 150–400)
RBC: 5 MIL/uL (ref 4.22–5.81)
RBC: 4.6 MIL/uL (ref 4.22–5.81)
RDW: 17.4 % — AB (ref 11.5–15.5)
RDW: 17.3 % — ABNORMAL HIGH (ref 11.5–15.5)
WBC: 7.3 10*3/uL (ref 4.0–10.5)
WBC: 6.5 10*3/uL (ref 4.0–10.5)

## 2016-10-19 LAB — C-REACTIVE PROTEIN: CRP: 5.7 mg/dL — AB (ref <1.0)

## 2016-10-19 LAB — BASIC METABOLIC PANEL
ANION GAP: 9 (ref 5–15)
BUN: 15 mg/dL (ref 6–20)
CALCIUM: 8.3 mg/dL — AB (ref 8.9–10.3)
CO2: 23 mmol/L (ref 22–32)
CREATININE: 1.03 mg/dL (ref 0.61–1.24)
Chloride: 103 mmol/L (ref 101–111)
Glucose, Bld: 97 mg/dL (ref 65–99)
Potassium: 3.9 mmol/L (ref 3.5–5.1)
SODIUM: 135 mmol/L (ref 135–145)

## 2016-10-19 LAB — COMPREHENSIVE METABOLIC PANEL
ALBUMIN: 3.4 g/dL — AB (ref 3.5–5.0)
ALT: 6 U/L — ABNORMAL LOW (ref 17–63)
AST: 21 U/L (ref 15–41)
Alkaline Phosphatase: 94 U/L (ref 38–126)
Anion gap: 10 (ref 5–15)
BUN: 16 mg/dL (ref 6–20)
CHLORIDE: 100 mmol/L — AB (ref 101–111)
CO2: 25 mmol/L (ref 22–32)
Calcium: 8.9 mg/dL (ref 8.9–10.3)
Creatinine, Ser: 0.96 mg/dL (ref 0.61–1.24)
GFR calc Af Amer: >60 mL/min (ref >60)
GFR calc non Af Amer: >60 mL/min (ref >60)
GLUCOSE: 99 mg/dL (ref 65–99)
POTASSIUM: 4.3 mmol/L (ref 3.5–5.1)
Sodium: 135 mmol/L (ref 135–145)
Total Bilirubin: 0.7 mg/dL (ref 0.3–1.2)
Total Protein: 7.2 g/dL (ref 6.5–8.1)

## 2016-10-19 LAB — I-STAT CG4 LACTIC ACID, ED
Lactic Acid, Venous: 1.81 mmol/L (ref 0.5–1.9)
Lactic Acid, Venous: 0.68 mmol/L (ref 0.5–1.9)

## 2016-10-19 LAB — I-STAT TROPONIN, ED: Troponin i, poc: 0.04 ng/mL (ref 0.00–0.08)

## 2016-10-19 LAB — PROTIME-INR
INR: 0.99
Prothrombin Time: 13 seconds (ref 11.4–15.2)

## 2016-10-19 LAB — PREALBUMIN: PREALBUMIN: 15.8 mg/dL — AB (ref 18–38)

## 2016-10-19 MED ORDER — SODIUM CHLORIDE 0.9% FLUSH
3.0000 mL | Freq: Two times a day (BID) | INTRAVENOUS | Status: DC
  Administered 2016-10-20 – 2016-10-22 (×4): 3 mL via INTRAVENOUS

## 2016-10-19 MED ORDER — CARBIDOPA-LEVODOPA ER 50-200 MG PO TBCR
1.0000 | EXTENDED_RELEASE_TABLET | Freq: Every day | ORAL | Status: DC
  Administered 2016-10-20 – 2016-10-23 (×5): 1 via ORAL
  Filled 2016-10-19 (×6): qty 1

## 2016-10-19 MED ORDER — ADULT MULTIVITAMIN W/MINERALS CH
1.0000 | ORAL_TABLET | Freq: Every day | ORAL | Status: DC
  Administered 2016-10-20 – 2016-10-24 (×5): 1 via ORAL
  Filled 2016-10-19 (×4): qty 1

## 2016-10-19 MED ORDER — SALINE SPRAY 0.65 % NA SOLN
1.0000 | NASAL | Status: DC | PRN
  Filled 2016-10-19: qty 44

## 2016-10-19 MED ORDER — ACETAMINOPHEN 650 MG RE SUPP: 650.0000 mg | Freq: Four times a day (QID) | RECTAL | Status: DC | PRN

## 2016-10-19 MED ORDER — ONDANSETRON HCL 4 MG/2ML IJ SOLN: 4.0000 mg | Freq: Four times a day (QID) | INTRAMUSCULAR | Status: DC | PRN

## 2016-10-19 MED ORDER — TRAZODONE HCL 100 MG PO TABS
100.0000 mg | ORAL_TABLET | Freq: Every day | ORAL | Status: DC
  Administered 2016-10-20 – 2016-10-23 (×5): 100 mg via ORAL
  Filled 2016-10-19 (×5): qty 1

## 2016-10-19 MED ORDER — MIRABEGRON ER 50 MG PO TB24
50.0000 mg | ORAL_TABLET | Freq: Every evening | ORAL | Status: DC
  Administered 2016-10-20 – 2016-10-24 (×6): 50 mg via ORAL
  Filled 2016-10-19 (×6): qty 1

## 2016-10-19 MED ORDER — FLUTICASONE PROPIONATE 50 MCG/ACT NA SUSP
1.0000 | Freq: Every day | NASAL | Status: DC
  Administered 2016-10-20 – 2016-10-24 (×6): 1 via NASAL
  Filled 2016-10-19: qty 16

## 2016-10-19 MED ORDER — CLOPIDOGREL BISULFATE 75 MG PO TABS
75.0000 mg | ORAL_TABLET | Freq: Every day | ORAL | Status: DC
  Administered 2016-10-20 – 2016-10-24 (×5): 75 mg via ORAL
  Filled 2016-10-19 (×5): qty 1

## 2016-10-19 MED ORDER — ATORVASTATIN CALCIUM 20 MG PO TABS
20.0000 mg | ORAL_TABLET | Freq: Every day | ORAL | Status: DC
  Administered 2016-10-20 – 2016-10-24 (×5): 20 mg via ORAL
  Filled 2016-10-19 (×5): qty 1

## 2016-10-19 MED ORDER — PIPERACILLIN-TAZOBACTAM 3.375 G IVPB 30 MIN
3.3750 g | Freq: Once | INTRAVENOUS | Status: AC
  Administered 2016-10-19: 3.375 g via INTRAVENOUS
  Filled 2016-10-19: qty 50

## 2016-10-19 MED ORDER — GABAPENTIN 100 MG PO CAPS
100.0000 mg | ORAL_CAPSULE | ORAL | Status: DC
Start: 2016-10-19 — End: 2016-10-19

## 2016-10-19 MED ORDER — CARBIDOPA-LEVODOPA 25-100 MG PO TABS
2.0000 | ORAL_TABLET | Freq: Three times a day (TID) | ORAL | Status: DC
  Administered 2016-10-20 – 2016-10-24 (×15): 2 via ORAL
  Filled 2016-10-19 (×15): qty 2

## 2016-10-19 MED ORDER — SODIUM CHLORIDE 0.9 % IV BOLUS (SEPSIS)
1000.0000 mL | Freq: Once | INTRAVENOUS | Status: DC
  Administered 2016-10-19: 1000 mL via INTRAVENOUS

## 2016-10-19 MED ORDER — SODIUM CHLORIDE 0.9 % IV SOLN
INTRAVENOUS | Status: DC
  Administered 2016-10-20 – 2016-10-22 (×4): via INTRAVENOUS

## 2016-10-19 MED ORDER — GABAPENTIN 400 MG PO CAPS
400.0000 mg | ORAL_CAPSULE | Freq: Every day | ORAL | Status: DC
  Administered 2016-10-20 – 2016-10-23 (×5): 400 mg via ORAL
  Filled 2016-10-19 (×5): qty 1

## 2016-10-19 MED ORDER — ACETAMINOPHEN 650 MG RE SUPP
650.0000 mg | Freq: Once | RECTAL | Status: AC
  Administered 2016-10-19: 650 mg via RECTAL
  Filled 2016-10-19: qty 1

## 2016-10-19 MED ORDER — VANCOMYCIN HCL IN DEXTROSE 1-5 GM/200ML-% IV SOLN
1000.0000 mg | Freq: Two times a day (BID) | INTRAVENOUS | Status: DC
  Administered 2016-10-19 – 2016-10-20 (×2): 1000 mg via INTRAVENOUS
  Filled 2016-10-19 (×2): qty 200

## 2016-10-19 MED ORDER — GABAPENTIN 100 MG PO CAPS
100.0000 mg | ORAL_CAPSULE | Freq: Two times a day (BID) | ORAL | Status: DC
  Administered 2016-10-21 – 2016-10-24 (×7): 100 mg via ORAL
  Filled 2016-10-19 (×8): qty 1

## 2016-10-19 MED ORDER — FINASTERIDE 5 MG PO TABS
5.0000 mg | ORAL_TABLET | Freq: Every evening | ORAL | Status: DC
  Administered 2016-10-20 – 2016-10-24 (×6): 5 mg via ORAL
  Filled 2016-10-19 (×6): qty 1

## 2016-10-19 MED ORDER — MOMETASONE FURO-FORMOTEROL FUM 200-5 MCG/ACT IN AERO
2.0000 | INHALATION_SPRAY | Freq: Two times a day (BID) | RESPIRATORY_TRACT | Status: DC
  Administered 2016-10-20 – 2016-10-24 (×7): 2 via RESPIRATORY_TRACT
  Filled 2016-10-19: qty 8.8

## 2016-10-19 MED ORDER — ALFUZOSIN HCL ER 10 MG PO TB24
10.0000 mg | ORAL_TABLET | Freq: Every day | ORAL | Status: DC
  Administered 2016-10-20 – 2016-10-24 (×5): 10 mg via ORAL
  Filled 2016-10-19 (×5): qty 1

## 2016-10-19 MED ORDER — MUPIROCIN 2 % EX OINT
1.0000 "application " | TOPICAL_OINTMENT | Freq: Two times a day (BID) | CUTANEOUS | Status: DC
  Administered 2016-10-20 – 2016-10-24 (×10): 1 via NASAL
  Filled 2016-10-19 (×2): qty 22

## 2016-10-19 MED ORDER — SODIUM CHLORIDE 0.9% FLUSH
3.0000 mL | INTRAVENOUS | Status: DC | PRN
  Administered 2016-10-23: 3 mL via INTRAVENOUS
  Filled 2016-10-19: qty 3

## 2016-10-19 MED ORDER — UMECLIDINIUM BROMIDE 62.5 MCG/INH IN AEPB
1.0000 | INHALATION_SPRAY | Freq: Every day | RESPIRATORY_TRACT | Status: DC
  Administered 2016-10-20 – 2016-10-24 (×5): 1 via RESPIRATORY_TRACT
  Filled 2016-10-19: qty 7

## 2016-10-19 MED ORDER — BUSPIRONE HCL 5 MG PO TABS
5.0000 mg | ORAL_TABLET | Freq: Two times a day (BID) | ORAL | Status: DC
  Administered 2016-10-20 – 2016-10-24 (×10): 5 mg via ORAL
  Filled 2016-10-19 (×11): qty 1

## 2016-10-19 MED ORDER — ACETAMINOPHEN 325 MG PO TABS: 650.0000 mg | ORAL_TABLET | Freq: Four times a day (QID) | ORAL | Status: DC | PRN

## 2016-10-19 MED ORDER — ENOXAPARIN SODIUM 40 MG/0.4ML ~~LOC~~ SOLN
40.0000 mg | SUBCUTANEOUS | Status: DC
  Administered 2016-10-20 – 2016-10-23 (×5): 40 mg via SUBCUTANEOUS
  Filled 2016-10-19 (×5): qty 0.4

## 2016-10-19 MED ORDER — SODIUM CHLORIDE 0.9 % IV BOLUS (SEPSIS)
1000.0000 mL | Freq: Once | INTRAVENOUS | Status: AC
  Administered 2016-10-19: 1000 mL via INTRAVENOUS

## 2016-10-19 MED ORDER — PROSIGHT PO TABS
1.0000 | ORAL_TABLET | Freq: Every day | ORAL | Status: DC
  Administered 2016-10-20 – 2016-10-24 (×5): 1 via ORAL
  Filled 2016-10-19 (×5): qty 1

## 2016-10-19 MED ORDER — SODIUM CHLORIDE 0.9 % IV SOLN
250.0000 mL | INTRAVENOUS | Status: DC | PRN
  Administered 2016-10-23: 250 mL via INTRAVENOUS

## 2016-10-19 MED ORDER — VANCOMYCIN HCL IN DEXTROSE 1-5 GM/200ML-% IV SOLN
1000.0000 mg | Freq: Once | INTRAVENOUS | Status: AC
  Administered 2016-10-19: 1000 mg via INTRAVENOUS
  Filled 2016-10-19: qty 200

## 2016-10-19 MED ORDER — PIPERACILLIN-TAZOBACTAM 3.375 G IVPB
3.3750 g | Freq: Three times a day (TID) | INTRAVENOUS | Status: DC
  Administered 2016-10-19 – 2016-10-20 (×2): 3.375 g via INTRAVENOUS
  Filled 2016-10-19 (×3): qty 50

## 2016-10-19 MED ORDER — ONDANSETRON HCL 4 MG PO TABS: 4.0000 mg | ORAL_TABLET | Freq: Four times a day (QID) | ORAL | Status: DC | PRN

## 2016-10-19 MED ORDER — SODIUM CHLORIDE 0.9% FLUSH
3.0000 mL | Freq: Two times a day (BID) | INTRAVENOUS | Status: DC
  Administered 2016-10-20 – 2016-10-23 (×6): 3 mL via INTRAVENOUS

## 2016-10-19 MED ORDER — LEVOTHYROXINE SODIUM 75 MCG PO TABS
150.0000 ug | ORAL_TABLET | Freq: Every day | ORAL | Status: DC
  Administered 2016-10-20 – 2016-10-24 (×5): 150 ug via ORAL
  Filled 2016-10-19 (×5): qty 2

## 2016-10-19 NOTE — ED Notes (Signed)
Pt to xray

## 2016-10-19 NOTE — Progress Notes (Signed)
Pharmacy Antibiotic Note  Bradley Bennett is a 83 y.o. male admitted on 10/19/2016 with cellulitis.  Pharmacy has been consulted for vancomycin and Zosyn dosing. WBC 7.3, Tmax 103.3, La 1.8  Plan: Vancomycin 1000mg  IV every 12 hours.  Goal trough 10-15 mcg/mL. Zosyn 3.375g IV q8h (4 hour infusion). F/u clinical progression  Weight: 188 lb (85.3 kg)  Temp (24hrs), Avg:102.7 F (39.3 C), Min:102 F (38.9 C), Max:103.3 F (39.6 C)   Recent Labs Lab 10/19/16 1425 10/19/16 1458  WBC 7.3  --   CREATININE 0.96  --   LATICACIDVEN  --  1.81    Estimated Creatinine Clearance: 62.3 mL/min (by C-G formula based on SCr of 0.96 mg/dL).    Allergies  Allergen Reactions  . Isosorbide Other (See Comments)    Reaction:  Headaches    Thank you for allowing pharmacy to be a part of this patient's care.  Jodean Lima Melayah Skorupski 10/19/2016 4:20 PM

## 2016-10-19 NOTE — ED Triage Notes (Signed)
Per ems, pt was seen here last week for low saturations on oxygen, discharged home. Pt lives at an assisted living, [place]. Pt family found patient at home urinated on himself, sats were 76s on 6L, pt placed on NRB now 90s. Pt is on 5L all times and 15L when walking. Hx of pulmonary fibrosis. Denies pain. AAOX4.

## 2016-10-19 NOTE — ED Notes (Signed)
Pt placed on 6L [place] placed in his mouth, pt breathing through mouth at this time. Sats remain 91% will reevaluate

## 2016-10-19 NOTE — H&P (Addendum)
History and Physical    Bradley Bennett QZR:[id number] DOB: 1932/11/20 DOA: 10/19/2016  PCP: [name], MD   Patient coming from: Home   Chief Complaint: Right foot erythema and confusion   HPI: Bradley Bennett is a 82 y.o. male with medical history significant of interstitial lung disease with significant chronic hypoxic respiratory failure who presents with right foot erythema and altered mentation. Patient was brought by his daughter after finding him to be confused. Apparently one week ago he had a respiratory arrest after running out of his supplemental oxygen, he was found down on a parking lot, required bag valve mask ventilation, brought to the hospital stabilized and discharged home. Since then patient has been disoriented, and confused, this symptoms have been persistent and progressive, no improving or worsening factors, no other associated symptoms. Today he was found by his daughter to be more confused, she noticed significant redness of his right foot and brought him to the hospital.  Unable to obtain further history from patient due to confusion, he reports having the foot redness for 7 days, been nonpainful. Most information has been obtained from his daughter at the bedside.   ED Course: Patient was found confused, significant erythema on his right foot, received IV fluids and IV antibiotics, he was referred for admission and further evaluation.  Review of Systems:  Unable to obtain due to patient's altered mentation.   Past Medical History:  Diagnosis Date  . Arthritis   . Asthma   . Coronary artery disease   . Emphysema of lung ([place])   . Hearing difficulty of both ears   . Heart attack ([place])   . Heart murmur   . Hyperlipidemia   . Hypertension   . ILD (interstitial lung disease) ([place])   . Parkinson disease [facility])     Past Surgical History:  Procedure Laterality Date  . CARPAL TUNNEL RELEASE Right 03/2014  . CORONARY ANGIOPLASTY WITH STENT PLACEMENT      . CORONARY ARTERY BYPASS GRAFT    . HERNIA REPAIR  10/2013   x3   . VEIN BYPASS SURGERY       reports that he quit smoking about 31 years ago. He has a 70.00 pack-year smoking history. He has never used smokeless tobacco. He reports that he drinks alcohol. He reports that he does not use drugs.  Allergies  Allergen Reactions  . Isosorbide Other (See Comments)    Reaction:  Headaches     Family History  Problem Relation Age of Onset  . Colon cancer Father   . Stroke Father   . Arthritis Mother   . Arthritis Sister   . Heart disease Brother   . Kidney cancer Brother    Unacceptable: Noncontributory, unremarkable, or negative. Acceptable: Family history reviewed and not pertinent (If you reviewed it)  Prior to Admission medications   Medication Sig Start Date End Date Taking? Authorizing Provider  acetaminophen (TYLENOL) 500 MG tablet Take 500-1,000 mg by mouth every 6 (six) hours as needed for mild pain, moderate pain, fever or headache.     [provider]  alfuzosin (UROXATRAL) 10 MG 24 hr tablet Take 10 mg by mouth daily. 09/28/16   [provider]  atorvastatin (LIPITOR) 20 MG tablet Take 1 tablet (20 mg total) by mouth daily. 02/08/16   Sueanne Margarita, MD  busPIRone (BUSPAR) 5 MG tablet Take 1 tablet (5 mg total) by mouth 2 (two) times daily. 07/14/16   [name], MD  carbidopa-levodopa (SINEMET CR) 50-200 MG tablet TAKE ONE TABLET BY MOUTH AT BEDTIME 10/10/16   Tat, Rebecca S, DO  carbidopa-levodopa (SINEMET IR) 25-100 MG tablet TAKE TWO TABLETS BY MOUTH THREE TIMES A DAY 10/10/16   Tat, Eustace Quail, DO  clopidogrel (PLAVIX) 75 MG tablet TAKE ONE TABLET BY MOUTH DAILY 10/10/16   [name], MD  finasteride (PROSCAR) 5 MG tablet Take 1 tablet (5 mg total) by mouth every evening. 05/12/16   Donita Brooks, NP  fluticasone (FLONASE) 50 MCG/ACT nasal spray Place 1 spray into both nostrils daily. 05/25/16   Juanito Doom, MD  Fluticasone-Salmeterol (ADVAIR  DISKUS) 500-50 MCG/DOSE AEPB Inhale 1 puff into the lungs 2 (two) times daily. 05/25/16   Juanito Doom, MD  gabapentin (NEURONTIN) 100 MG capsule Take 2 capsules (200 mg total) by mouth at bedtime. 09/14/16   Lyndal Pulley, DO  gabapentin (NEURONTIN) 400 MG capsule Take 1 capsule (400 mg total) by mouth at bedtime. Patient not taking: Reported on 10/10/2016 10/05/16   Lyndal Pulley, DO  levothyroxine (SYNTHROID, LEVOTHROID) 150 MCG tablet TAKE ONE TABLET BY MOUTH DAILY 10/10/16   [name], MD  mirabegron ER (MYRBETRIQ) 50 MG TB24 tablet Take 50 mg by mouth every evening.    [provider]  Multiple Vitamin (MULTIVITAMIN WITH MINERALS) TABS tablet Take 1 tablet by mouth daily at 12 noon.    [provider]  mupirocin ointment (BACTROBAN) 2 % Place 1 application into the nose 2 (two) times daily.    [provider]  sodium chloride (OCEAN) 0.65 % SOLN nasal spray Place 1 spray into both nostrils as needed for congestion. 05/12/16   Donita Brooks, NP  tamsulosin (FLOMAX) 0.4 MG CAPS capsule Take 1 capsule (0.4 mg total) by mouth every evening. 05/12/16   Donita Brooks, NP  traZODone (DESYREL) 100 MG tablet Take 1 tablet (100 mg total) by mouth at bedtime. 05/25/16   Juanito Doom, MD  umeclidinium bromide (INCRUSE ELLIPTA) 62.5 MCG/INH AEPB Inhale 1 puff into the lungs daily. 05/25/16   Juanito Doom, MD    Physical Exam: Vitals:   10/19/16 1409 10/19/16 1416 10/19/16 1443 10/19/16 1445  BP:  (!) 145/70  126/70  Pulse:  (!) 110  (!) 104  Resp:  (!) 30  (!) 21  Temp:  (!) 103.3 F (39.6 C)    TempSrc:  Rectal    SpO2: 95% 94%  98%  Weight:   85.3 kg (188 lb)     Constitutional: deconditioning, and ill looking appearing Vitals:   10/19/16 1409 10/19/16 1416 10/19/16 1443 10/19/16 1445  BP:  (!) 145/70  126/70  Pulse:  (!) 110  (!) 104  Resp:  (!) 30  (!) 21  Temp:  (!) 103.3 F (39.6 C)    TempSrc:  Rectal    SpO2: 95% 94%  98%  Weight:    85.3 kg (188 lb)    Eyes: PERRL, lids and conjunctivae pale ENMT: Mucous membranes are dry. Posterior pharynx clear of any exudate or lesions.Normal dentition.  Neck: normal, supple, no masses, no thyromegaly Respiratory: auscultation bilaterally with scattered rales, but no wheezing, no rales. Normal respiratory effort. No accessory muscle use.  Cardiovascular: Regular rate and rhythm, no murmurs / rubs / gallops. No extremity edema. 2+ pedal pulses. No carotid bruits.  Abdomen: no tenderness, no masses palpated. No hepatosplenomegaly. Bowel sounds positive.  Musculoskeletal: no clubbing / cyanosis. No joint deformity upper  and lower extremities. Good ROM, no contractures. Normal muscle tone.  Skin: right foot with erythema and edema, first toe with unstageable necrotic ulcerated lesion at the lateral aspect,  positive erythema at the forefoot expanding proximally, tracking the lymphatics.  Neurologic: patient awake and able to respond to simple questions, disorientated and confused. Not agitated    Labs on Admission: I have personally reviewed following labs and imaging studies  CBC:  Recent Labs Lab 10/19/16 1425  WBC 7.3  NEUTROABS 6.0  HGB 14.3  HCT 44.8  MCV 89.6  PLT 026   Basic Metabolic Panel:  Recent Labs Lab 10/19/16 1425  NA 135  K 4.3  CL 100*  CO2 25  GLUCOSE 99  BUN 16  CREATININE 0.96  CALCIUM 8.9   GFR: Estimated Creatinine Clearance: 62.3 mL/min (by C-G formula based on SCr of 0.96 mg/dL). Liver Function Tests:  Recent Labs Lab 10/19/16 1425  AST 21  ALT 6*  ALKPHOS 94  BILITOT 0.7  PROT 7.2  ALBUMIN 3.4*   No results for input(s): LIPASE, AMYLASE in the last 168 hours. No results for input(s): AMMONIA in the last 168 hours. Coagulation Profile:  Recent Labs Lab 10/19/16 1425  INR 0.99   Cardiac Enzymes: No results for input(s): CKTOTAL, CKMB, CKMBINDEX, TROPONINI in the last 168 hours. BNP (last 3 results) No results for input(s):  PROBNP in the last 8760 hours. HbA1C: No results for input(s): HGBA1C in the last 72 hours. CBG: No results for input(s): GLUCAP in the last 168 hours. Lipid Profile: No results for input(s): CHOL, HDL, LDLCALC, TRIG, CHOLHDL, LDLDIRECT in the last 72 hours. Thyroid Function Tests: No results for input(s): TSH, T4TOTAL, FREET4, T3FREE, THYROIDAB in the last 72 hours. Anemia Panel: No results for input(s): VITAMINB12, FOLATE, FERRITIN, TIBC, IRON, RETICCTPCT in the last 72 hours. Urine analysis:    Component Value Date/Time   COLORURINE AMBER (A) 05/10/2016 1714   APPEARANCEUR CLEAR 05/10/2016 1714   LABSPEC 1.025 05/10/2016 1714   PHURINE 5.0 05/10/2016 1714   GLUCOSEU NEGATIVE 05/10/2016 1714   HGBUR NEGATIVE 05/10/2016 1714   BILIRUBINUR NEGATIVE 05/10/2016 1714   KETONESUR 5 (A) 05/10/2016 1714   PROTEINUR 30 (A) 05/10/2016 1714   NITRITE NEGATIVE 05/10/2016 1714   LEUKOCYTESUR NEGATIVE 05/10/2016 1714    Radiological Exams on Admission: Dg Foot Complete Right  Result Date: 10/19/2016 CLINICAL DATA:  Right great toe infection. Evaluate for gas in soft tissue. EXAM: RIGHT FOOT COMPLETE - 3+ VIEW COMPARISON:  None. FINDINGS: Negative for gas or foreign body in the great toe, or elsewhere. No visible ulcer or osteomyelitis. No acute fracture. Mild first metatarsal head irregularity that is likely degenerative. IMPRESSION: Negative for soft tissue gas, opaque foreign body, or acute osseous finding. Electronically Signed   By: Monte Fantasia M.D.   On: 10/19/2016 15:42    EKG: Independently reviewed. EKG was normal sinus rhythm, normal intervals, normal axis.   Assessment/Plan Active Problems:   Cellulitis   This is an 83 year old male with significant history of chronic hypoxic respiratory failure due to interstitial lung disease presents with altered mentation and right foot erythema. Unclear for how long he had this lesion on his right foot. Seems to be nontender. Patient is  not diabetic. On initial physical examination temperature 103.3, blood pressure 145/70, heart rate 111, respiratory rate 30, oxygen saturation 94% on 100% FiO2. Dry mucous membranes, pale conjunctiva, lungs had rales bilaterally, no wheezing or rhonchi, heart S1-S2 present and rhythmic, tachycardic, no  gallop, rubs or murmurs, the abdomen was soft nontender, mildly distended, no rebound or guarding, lower extremity with no edema, right foot with significant erythema, large unstageable necrotic ulcer, at the lateral aspect of the first toe. Sodium 135, potassium 4.3, chloride 100, bicarbonate 25, glucose 99, BUN 16, creatinine 0.96, white count 7.3, hemoglobin 14.3, hematocrit 44.8, platelets 284. Urinalysis negative for infection.  Patient will be admitted to the hospital with working diagnosis of sepsis due to right foot cellulitis.  1. Sepsis due to right foot cellulitis. Patient will be admitted to the stepdown unit, will continue IV fluids with isotonic crystalloid solutions at 100 mL per hour, broad-spectrum antibiotic therapy with IV vancomycin and IV Zosyn. Will keep mean arterial pressure above 65. Follow-up on cell count, cultures and temperature curve. Will proceed with foot MRI to rule out deep infection.   2. Metabolic encephalopathy. Related to sepsis, continue neuro checks per unit protocol.  3. Acute on chronic hypoxic respiratory failure. Will continue oxygen supplementation, per nasal cannula, to target oxygen saturation greater than 88%. Chest x-ray personally review showing bilateral interstitial infiltrates unchanged from prior films.  4. Parkinson's disease. Continue Sinemet, physical therapy once patient more stable. Continue trazodone, buspirone, gabapentin.   5. Hypothyroid. Continue levothyroxine.   DVT prophylaxis: enoxaparin Code Status: Partial (No CPR)  Family Communication: I spoke with patient's daughter at the bedside and all questions were addressed  Disposition  Plan: Home  Consults called:   Admission status: Inpatient    Mauricio Gerome Apley MD [facility] Pager [phone]  If 7PM-7AM, please contact night-coverage [email or web address] Password [facility]  10/19/2016, 3:54 PM

## 2016-10-19 NOTE — Care Management Note (Signed)
Case Management Note  Patient Details  Name: Bradley Bennett MRN: [id number] Date of Birth: 07-06-1932  Subjective/Objective:       Presented to [facility] ED with cellulitis of great right toe, SOB             Action/Plan: CM met with patient and daughter Bradley Bennett [phone] to discuss care transition planning. Daughter reports patient lives at [facility], patient is on home oxygen supplied by the [place]. [name] is his PCP last visit 10/12/16. Utilizes the [facility] on [address]. Discussed recommendations for [facility] services patient and daughter are agreeable, [name] is the [facility] service contracted at [employer].  Unit CM will follow up with patient and family to complete the  transitional plan.  Expected Discharge Date:                  Expected Discharge Plan:  [place]  In-House Referral:     Discharge planning Services  CM Consult  Post Acute Care Choice:    Choice offered to:  Patient, Adult Children  DME Arranged:    DME Agency:     HH Arranged:  RN, OT, PT [place] Agency:  Other - See comment (Patient is a resident at [facility] contracts with [employer])  Status of Service:  In process, will continue to follow  If discussed at Long Length of Stay Meetings, dates discussed:    Additional CommentsLaurena Slimmer, RN 10/19/2016, 10:51 PM

## 2016-10-19 NOTE — ED Provider Notes (Signed)
[facility] DEPT Provider Note   CSN: [id number] Arrival date & time: 10/19/16  1406     History   Chief Complaint Chief Complaint  Patient presents with  . Shortness of Breath    HPI Bradley Bennett is a 83 y.o. male.  HPI   83 year old male with history of asthma, CAD, emphysema currently on 6 L of home oxygenation, history of Parkinson disease, brought here via EMS for evaluation of shortness of breath and altered mental status. Daughter who was at bedside were able to give history. Daughter noticed that patient was having trouble breathing, had urinated on himself, and appears altered today when she came to visit him. Early in the day when she was on the phone with him pt sounds "off".  Yesterday pt was at home playing bridge and mentating appropriately.  For the past week daughter notices that pt has been more forgetful, and doesn't recognize that he is out of his home O2, or does recognize that his O2 setting is suboptimal.  When asked if patient is in any pain, patient denies.  He does admits to occasional nonproductive cough. When asked if he is having shortness of breath, patient denies. He denies having any headache, chest pain, abdominal pain, vomiting or diarrhea. However, patient appears somnolent but arousable. He has history of pulmonary fibrosis. He lives at an assisted living by himself. His daughter lives [address]. Patient does not want to be resuscitated if he has cardiac etiology however if it is pulmonary etiology would like resuscitation.  Past Medical History:  Diagnosis Date  . Arthritis   . Asthma   . Coronary artery disease   . Emphysema of lung ([place])   . Hearing difficulty of both ears   . Heart attack ([place])   . Heart murmur   . Hyperlipidemia   . Hypertension   . ILD (interstitial lung disease) ([place])   . Parkinson disease [facility])     Patient Active Problem List   Diagnosis Date Noted  . Trigger point of right shoulder region 10/05/2016  .  Cervical radiculopathy 09/15/2016  . Chronic respiratory failure ([place]) 08/21/2016  . Chronic right shoulder pain 08/21/2016  . Hypotension 07/15/2016  . Diarrhea 07/14/2016  . Prediabetes 07/13/2016  . Dyspnea 06/15/2016  . Allergic rhinitis 05/25/2016  . COPD exacerbation ([place]) 05/10/2016  . Thrush 01/15/2016  . Trigger thumb of both hands 01/15/2016  . COPD (chronic obstructive pulmonary disease) ([place]) 07/16/2015  . Hypoxemia 07/16/2015  . Postinflammatory pulmonary fibrosis ([place]) 07/16/2015  . Parkinson disease ([place]) 07/16/2015  . Hypothyroidism 07/16/2015  . Insomnia 07/16/2015  . BPH (benign prostatic hypertrophy) 07/16/2015  . Anxiety 07/16/2015  . Carpal tunnel syndrome, right 07/16/2015  . Chronic lower back pain 07/16/2015  . Macular degeneration 07/16/2015  . CAD in native artery 06/15/2015  . 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 S/P coronary artery stent placement 06/15/2015  . Hyperlipidemia 06/15/2015    Past Surgical History:  Procedure Laterality Date  . CARPAL TUNNEL RELEASE Right 03/2014  . CORONARY ANGIOPLASTY WITH STENT PLACEMENT    . CORONARY ARTERY BYPASS GRAFT    . HERNIA REPAIR  10/2013   x3   . VEIN BYPASS SURGERY         Home Medications    Prior to Admission medications   Medication Sig Start Date End Date Taking? Authorizing Provider  acetaminophen (TYLENOL) 500 MG tablet Take 500-1,000 mg by mouth every 6 (six) hours as needed for mild pain,  moderate pain, fever or headache.     [provider]  alfuzosin (UROXATRAL) 10 MG 24 hr tablet Take 10 mg by mouth daily. 09/28/16   [provider]  atorvastatin (LIPITOR) 20 MG tablet Take 1 tablet (20 mg total) by mouth daily. 02/08/16   Sueanne Margarita, MD  busPIRone (BUSPAR) 5 MG tablet Take 1 tablet (5 mg total) by mouth 2 (two) times daily. 07/14/16   [name], MD  carbidopa-levodopa (SINEMET CR) 50-200 MG tablet TAKE ONE TABLET BY MOUTH AT BEDTIME 10/10/16   Tat, Eustace Quail, DO    carbidopa-levodopa (SINEMET IR) 25-100 MG tablet TAKE TWO TABLETS BY MOUTH THREE TIMES A DAY 10/10/16   Tat, Eustace Quail, DO  clopidogrel (PLAVIX) 75 MG tablet TAKE ONE TABLET BY MOUTH DAILY 10/10/16   [name], MD  finasteride (PROSCAR) 5 MG tablet Take 1 tablet (5 mg total) by mouth every evening. 05/12/16   Donita Brooks, NP  fluticasone (FLONASE) 50 MCG/ACT nasal spray Place 1 spray into both nostrils daily. 05/25/16   Juanito Doom, MD  Fluticasone-Salmeterol (ADVAIR DISKUS) 500-50 MCG/DOSE AEPB Inhale 1 puff into the lungs 2 (two) times daily. 05/25/16   Juanito Doom, MD  gabapentin (NEURONTIN) 100 MG capsule Take 2 capsules (200 mg total) by mouth at bedtime. 09/14/16   Lyndal Pulley, DO  gabapentin (NEURONTIN) 400 MG capsule Take 1 capsule (400 mg total) by mouth at bedtime. Patient not taking: Reported on 10/10/2016 10/05/16   Lyndal Pulley, DO  levothyroxine (SYNTHROID, LEVOTHROID) 150 MCG tablet TAKE ONE TABLET BY MOUTH DAILY 10/10/16   [name], MD  mirabegron ER (MYRBETRIQ) 50 MG TB24 tablet Take 50 mg by mouth every evening.    [provider]  Multiple Vitamin (MULTIVITAMIN WITH MINERALS) TABS tablet Take 1 tablet by mouth daily at 12 noon.    [provider]  mupirocin ointment (BACTROBAN) 2 % Place 1 application into the nose 2 (two) times daily.    [provider]  sodium chloride (OCEAN) 0.65 % SOLN nasal spray Place 1 spray into both nostrils as needed for congestion. 05/12/16   Donita Brooks, NP  tamsulosin (FLOMAX) 0.4 MG CAPS capsule Take 1 capsule (0.4 mg total) by mouth every evening. 05/12/16   Donita Brooks, NP  traZODone (DESYREL) 100 MG tablet Take 1 tablet (100 mg total) by mouth at bedtime. 05/25/16   Juanito Doom, MD  umeclidinium bromide (INCRUSE ELLIPTA) 62.5 MCG/INH AEPB Inhale 1 puff into the lungs daily. 05/25/16   Juanito Doom, MD    Family History Family History  Problem Relation Age of Onset  . Colon  cancer Father   . Stroke Father   . Arthritis Mother   . Arthritis Sister   . Heart disease Brother   . Kidney cancer Brother     Social History Social History  Substance Use Topics  . Smoking status: Former Smoker    Packs/day: 2.00    Years: 35.00    Quit date: 08/15/1985  . Smokeless tobacco: Never Used     Comment: quit smoking in 1992  . Alcohol use 0.0 oz/week     Comment: twice every 6 months     Allergies   Isosorbide   Review of Systems Review of Systems  Unable to perform ROS: Mental status change     Physical Exam Updated Vital Signs BP (!) 145/70   Pulse (!) 110   Temp (!)  103.3 F (39.6 C) (Rectal)   Resp (!) 30   SpO2 94%   Physical Exam  Constitutional: He appears well-developed and well-nourished. No distress.  Elderly male, somnolence but arousable  HENT:  Head: Atraumatic.  Eyes: Conjunctivae are normal.  Neck: Neck supple.  No nuchal rigidity  Cardiovascular:  Tachycardia, no murmurs rubs gallops  Pulmonary/Chest:  Decreased breath sounds without overt wheezes, rales, rhonchi  Abdominal: Soft. Bowel sounds are normal. He exhibits no distension. There is no tenderness.  Neurological: No cranial nerve deficit or sensory deficit. GCS eye subscore is 4. GCS verbal subscore is 5. GCS motor subscore is 6.  Somnolence but easily arousable. Oriented to time, place, situation.  Able to move all 4 extremities.  Skin: No rash noted.  Right foot: Great toe with an ulceration approximately 2 x 4 cm to the medial aspects with necrotic skin changes, surrounding skin erythema with several blister and red streaking extending to the dorsum of foot. Brisk cap refill,  pedis pulse palpable.  Psychiatric: He has a normal mood and affect.  Nursing note and vitals reviewed.        ED Treatments / Results  Labs (all labs ordered are listed, but only abnormal results are displayed) Labs Reviewed  COMPREHENSIVE METABOLIC PANEL - Abnormal; Notable for the  following:       Result Value   Chloride 100 (*)    Albumin 3.4 (*)    ALT 6 (*)    All other components within normal limits  CBC WITH DIFFERENTIAL/PLATELET - Abnormal; Notable for the following:    RDW 17.4 (*)    Lymphs Abs 0.6 (*)    All other components within normal limits  CULTURE, BLOOD (ROUTINE X 2)  CULTURE, BLOOD (ROUTINE X 2)  PROTIME-INR  URINALYSIS, ROUTINE W REFLEX MICROSCOPIC  I-STAT CG4 LACTIC ACID, ED  I-STAT TROPONIN, ED    EKG  EKG Interpretation None     ED ECG REPORT   Date: 10/19/2016  Rate: 109  Rhythm: sinus tachycardia  QRS Axis: normal  Intervals: normal  ST/T Wave abnormalities: normal  Conduction Disutrbances:none  Narrative Interpretation:   Old EKG Reviewed: unchanged  I have personally reviewed the EKG tracing and agree with the computerized printout as noted.   Radiology Dg Foot Complete Right  Result Date: 10/19/2016 CLINICAL DATA:  Right great toe infection. Evaluate for gas in soft tissue. EXAM: RIGHT FOOT COMPLETE - 3+ VIEW COMPARISON:  None. FINDINGS: Negative for gas or foreign body in the great toe, or elsewhere. No visible ulcer or osteomyelitis. No acute fracture. Mild first metatarsal head irregularity that is likely degenerative. IMPRESSION: Negative for soft tissue gas, opaque foreign body, or acute osseous finding. Electronically Signed   By: Monte Fantasia M.D.   On: 10/19/2016 15:42    Procedures Procedures (including critical care time)  Medications Ordered in ED Medications  vancomycin (VANCOCIN) IVPB 1000 mg/200 mL premix (1,000 mg Intravenous New Bag/Given 10/19/16 1501)  piperacillin-tazobactam (ZOSYN) IVPB 3.375 g (3.375 g Intravenous New Bag/Given 10/19/16 1503)  sodium chloride 0.9 % bolus 1,000 mL (1,000 mLs Intravenous New Bag/Given 10/19/16 1501)  acetaminophen (TYLENOL) suppository 650 mg (650 mg Rectal Given 10/19/16 1501)     Initial Impression / Assessment and Plan / ED Course  I have reviewed the  triage vital signs and the nursing notes.  Pertinent labs & imaging results that were available during my care of the patient were reviewed by me and considered in my medical decision  making (see chart for details).     BP 126/70   Pulse (!) 104   Temp (!) 103.3 F (39.6 C) (Rectal)   Resp (!) 21   Wt 85.3 kg (188 lb)   SpO2 98%   BMI 26.59 kg/m    Final Clinical Impressions(s) / ED Diagnoses   Final diagnoses:  Cellulitis of great toe of right foot  Sepsis, due to unspecified organism (HCC)  SOB (shortness of breath)    New Prescriptions New Prescriptions   No medications on file   3:05 PM Patient here with altered mental status changes, increases of breath, and fever, along with an obvious infected right foot. He meets sepsis criteria. Broad-spectrum antibiotic including vancomycin and Zosyn initiated. IVF at 36ml/kg started.  Tylenol given for fever.  Care discussed with Dr. Thomasene Lot.    3:25 PM No patient has an elevated temperature of 103.3, mildly tachycardic with heart rate of 104, his blood pressure is stable at 126/70, normal lactic acid of 1.81, normal WBC of 7.3. Electrolytes panel are reassuring. Normal troponin. EKG without acute ischemic changes. Currently awaits for results of the chest x-ray as well as x-ray of the right foot. Plan to decrease the amount of fluid resuscitation as there are no obvious evidence of endorgan damage. Will consult for admission  3:53 PM Appreciate consultation of [facility] Dr. Cathlean Sauer who agrees to see pt in the ER and will admit for further care.    CRITICAL CARE Performed by: Domenic Moras Total critical care time: 35 minutes Critical care time was exclusive of separately billable procedures and treating other patients. Critical care was necessary to treat or prevent imminent or life-threatening deterioration. Critical care was time spent personally by me on the following activities: development of treatment plan with patient  and/or surrogate as well as nursing, discussions with consultants, evaluation of patient's response to treatment, examination of patient, obtaining history from patient or surrogate, ordering and performing treatments and interventions, ordering and review of laboratory studies, ordering and review of radiographic studies, pulse oximetry and re-evaluation of patient's condition.    Domenic Moras, PA-C 10/19/16 1554    [place], Fredia Sorrow, MD 10/20/16 [phone]

## 2016-10-20 ENCOUNTER — Encounter (HOSPITAL_COMMUNITY): Payer: Self-pay | Admitting: Infectious Diseases

## 2016-10-20 ENCOUNTER — Ambulatory Visit: Payer: Medicare Other | Admitting: Internal Medicine

## 2016-10-20 ENCOUNTER — Inpatient Hospital Stay (HOSPITAL_COMMUNITY): Payer: Medicare Other

## 2016-10-20 DIAGNOSIS — B9561 Methicillin susceptible Staphylococcus aureus infection as the cause of diseases classified elsewhere: Secondary | ICD-10-CM

## 2016-10-20 DIAGNOSIS — R7303 Prediabetes: Secondary | ICD-10-CM

## 2016-10-20 DIAGNOSIS — G20 Parkinson's disease: Secondary | ICD-10-CM

## 2016-10-20 DIAGNOSIS — E871 Hypo-osmolality and hyponatremia: Secondary | ICD-10-CM

## 2016-10-20 DIAGNOSIS — E7800 Pure hypercholesterolemia, unspecified: Secondary | ICD-10-CM

## 2016-10-20 DIAGNOSIS — I5030 Unspecified diastolic (congestive) heart failure: Secondary | ICD-10-CM

## 2016-10-20 DIAGNOSIS — A4101 Sepsis due to Methicillin susceptible Staphylococcus aureus: Principal | ICD-10-CM

## 2016-10-20 DIAGNOSIS — R0609 Other forms of dyspnea: Secondary | ICD-10-CM

## 2016-10-20 DIAGNOSIS — R7881 Bacteremia: Secondary | ICD-10-CM

## 2016-10-20 DIAGNOSIS — E038 Other specified hypothyroidism: Secondary | ICD-10-CM

## 2016-10-20 DIAGNOSIS — J8410 Pulmonary fibrosis, unspecified: Secondary | ICD-10-CM

## 2016-10-20 DIAGNOSIS — J449 Chronic obstructive pulmonary disease, unspecified: Secondary | ICD-10-CM

## 2016-10-20 DIAGNOSIS — R0602 Shortness of breath: Secondary | ICD-10-CM

## 2016-10-20 DIAGNOSIS — I361 Nonrheumatic tricuspid (valve) insufficiency: Secondary | ICD-10-CM

## 2016-10-20 LAB — BLOOD CULTURE ID PANEL (REFLEXED)
Acinetobacter baumannii: NOT DETECTED
CANDIDA ALBICANS: NOT DETECTED
CANDIDA TROPICALIS: NOT DETECTED
Candida glabrata: NOT DETECTED
Candida krusei: NOT DETECTED
Candida parapsilosis: NOT DETECTED
Carbapenem resistance: NOT DETECTED
ENTEROBACTER CLOACAE COMPLEX: NOT DETECTED
ENTEROBACTERIACEAE SPECIES: NOT DETECTED
ENTEROCOCCUS SPECIES: NOT DETECTED
Escherichia coli: NOT DETECTED
HAEMOPHILUS INFLUENZAE: NOT DETECTED
Klebsiella oxytoca: NOT DETECTED
Klebsiella pneumoniae: NOT DETECTED
Listeria monocytogenes: NOT DETECTED
METHICILLIN RESISTANCE: NOT DETECTED
Neisseria meningitidis: NOT DETECTED
PROTEUS SPECIES: NOT DETECTED
Pseudomonas aeruginosa: NOT DETECTED
STAPHYLOCOCCUS AUREUS BCID: DETECTED — AB
STREPTOCOCCUS PNEUMONIAE: NOT DETECTED
Serratia marcescens: NOT DETECTED
Staphylococcus species: DETECTED — AB
Streptococcus agalactiae: NOT DETECTED
Streptococcus pyogenes: NOT DETECTED
Streptococcus species: NOT DETECTED
VANCOMYCIN RESISTANCE: NOT DETECTED

## 2016-10-20 LAB — CBC WITH DIFFERENTIAL/PLATELET
BASOS ABS: 0 10*3/uL (ref 0.0–0.1)
Basophils Relative: 0 %
EOS PCT: 4 %
Eosinophils Absolute: 0.2 10*3/uL (ref 0.0–0.7)
HCT: 36.4 % — ABNORMAL LOW (ref 39.0–52.0)
Hemoglobin: 12 g/dL — ABNORMAL LOW (ref 13.0–17.0)
LYMPHS PCT: 15 %
Lymphs Abs: 0.8 10*3/uL (ref 0.7–4.0)
MCH: 29.6 pg (ref 26.0–34.0)
MCHC: 33 g/dL (ref 30.0–36.0)
MCV: 89.9 fL (ref 78.0–100.0)
MONOS PCT: 11 %
Monocytes Absolute: 0.6 10*3/uL (ref 0.1–1.0)
Neutro Abs: 3.9 10*3/uL (ref 1.7–7.7)
Neutrophils Relative %: 70 %
PLATELETS: 254 10*3/uL (ref 150–400)
RBC: 4.05 MIL/uL — ABNORMAL LOW (ref 4.22–5.81)
RDW: 17.6 % — AB (ref 11.5–15.5)
WBC: 5.6 10*3/uL (ref 4.0–10.5)

## 2016-10-20 LAB — COMPREHENSIVE METABOLIC PANEL
ALT: 7 U/L — ABNORMAL LOW (ref 17–63)
ALT: 16 U/L — AB (ref 17–63)
ANION GAP: 7 (ref 5–15)
AST: 22 U/L (ref 15–41)
AST: 27 U/L (ref 15–41)
Albumin: 2.4 g/dL — ABNORMAL LOW (ref 3.5–5.0)
Albumin: 2.7 g/dL — ABNORMAL LOW (ref 3.5–5.0)
Alkaline Phosphatase: 75 U/L (ref 38–126)
Alkaline Phosphatase: 83 U/L (ref 38–126)
Anion gap: 5 (ref 5–15)
BUN: 11 mg/dL (ref 6–20)
BUN: 14 mg/dL (ref 6–20)
CHLORIDE: 102 mmol/L (ref 101–111)
CHLORIDE: 102 mmol/L (ref 101–111)
CO2: 27 mmol/L (ref 22–32)
CO2: 25 mmol/L (ref 22–32)
CREATININE: 0.87 mg/dL (ref 0.61–1.24)
Calcium: 8.2 mg/dL — ABNORMAL LOW (ref 8.9–10.3)
Calcium: 8.3 mg/dL — ABNORMAL LOW (ref 8.9–10.3)
Creatinine, Ser: 0.89 mg/dL (ref 0.61–1.24)
Glucose, Bld: 111 mg/dL — ABNORMAL HIGH (ref 65–99)
Glucose, Bld: 126 mg/dL — ABNORMAL HIGH (ref 65–99)
POTASSIUM: 3.5 mmol/L (ref 3.5–5.1)
POTASSIUM: 4 mmol/L (ref 3.5–5.1)
SODIUM: 134 mmol/L — AB (ref 135–145)
Sodium: 134 mmol/L — ABNORMAL LOW (ref 135–145)
TOTAL PROTEIN: 5.6 g/dL — AB (ref 6.5–8.1)
Total Bilirubin: 0.8 mg/dL (ref 0.3–1.2)
Total Bilirubin: 0.9 mg/dL (ref 0.3–1.2)
Total Protein: 6.5 g/dL (ref 6.5–8.1)

## 2016-10-20 LAB — ECHOCARDIOGRAM COMPLETE
AVLVOTPG: 7 mmHg
CHL CUP RV SYS PRESS: 56 mmHg
EERAT: 5.7
EWDT: 204 ms
FS: 38 % (ref 28–44)
IVS/LV PW RATIO, ED: 0.97
LA ID, A-P, ES: 43 mm
LA diam end sys: 43 mm
LA diam index: 2.12 cm/m2
LA vol A4C: 70.2 ml
LA vol index: 32.2 mL/m2
LA vol: 65.2 mL
LV E/e'average: 5.7
LV TDI E'MEDIAL: 9.69
LV e' LATERAL: 13.1 cm/s
LVEEMED: 5.7
LVOT SV: 88 mL
LVOT VTI: 28.1 cm
LVOT area: 3.14 cm2
LVOT diameter: 20 mm
LVOTPV: 134 cm/s
MV Dec: 204
MV pk A vel: 81 m/s
MVPG: 2 mmHg
MVPKEVEL: 74.7 m/s
PW: 12 mm — AB (ref 0.6–1.1)
RV LATERAL S' VELOCITY: 10.8 cm/s
RV TAPSE: 18.9 mm
Reg peak vel: 364 cm/s
TDI e' lateral: 13.1
TR max vel: 364 cm/s
WEIGHTICAEL: 2881.85 [oz_av]

## 2016-10-20 LAB — HEMOGLOBIN A1C
Hgb A1c MFr Bld: 6 % — ABNORMAL HIGH (ref 4.8–5.6)
Mean Plasma Glucose: 125.5 mg/dL

## 2016-10-20 LAB — HIV ANTIBODY (ROUTINE TESTING W REFLEX): HIV SCREEN 4TH GENERATION: NONREACTIVE

## 2016-10-20 LAB — PHOSPHORUS: PHOSPHORUS: 2.6 mg/dL (ref 2.5–4.6)

## 2016-10-20 LAB — MAGNESIUM: MAGNESIUM: 1.7 mg/dL (ref 1.7–2.4)

## 2016-10-20 LAB — MRSA PCR SCREENING: MRSA by PCR: NEGATIVE

## 2016-10-20 LAB — SEDIMENTATION RATE: Sed Rate: 71 mm/hr — ABNORMAL HIGH (ref 0–16)

## 2016-10-20 MED ORDER — MUPIROCIN CALCIUM 2 % EX CREA
TOPICAL_CREAM | Freq: Every day | CUTANEOUS | Status: DC
  Administered 2016-10-20 – 2016-10-24 (×6): via TOPICAL
  Filled 2016-10-20: qty 15

## 2016-10-20 MED ORDER — GADOBENATE DIMEGLUMINE 529 MG/ML IV SOLN
18.0000 mL | Freq: Once | INTRAVENOUS | Status: AC | PRN
  Administered 2016-10-20: 18 mL via INTRAVENOUS

## 2016-10-20 MED ORDER — BOOST PLUS PO LIQD
237.0000 mL | Freq: Three times a day (TID) | ORAL | Status: DC
  Administered 2016-10-21 – 2016-10-22 (×6): 237 mL via ORAL
  Filled 2016-10-20 (×16): qty 237

## 2016-10-20 MED ORDER — CEFAZOLIN SODIUM-DEXTROSE 2-4 GM/100ML-% IV SOLN
2.0000 g | Freq: Three times a day (TID) | INTRAVENOUS | Status: DC
  Administered 2016-10-20 – 2016-10-24 (×13): 2 g via INTRAVENOUS
  Filled 2016-10-20 (×16): qty 100

## 2016-10-20 NOTE — Progress Notes (Signed)
PHARMACY - PHYSICIAN COMMUNICATION CRITICAL VALUE ALERT - BLOOD CULTURE IDENTIFICATION (BCID)  Results for orders placed or performed during the hospital encounter of 10/19/16  Blood Culture ID Panel (Reflexed) (Collected: 10/19/2016  2:30 PM)  Result Value Ref Range   Enterococcus species NOT DETECTED NOT DETECTED   Vancomycin resistance NOT DETECTED NOT DETECTED   Listeria monocytogenes NOT DETECTED NOT DETECTED   Staphylococcus species DETECTED (A) NOT DETECTED   Staphylococcus aureus DETECTED (A) NOT DETECTED   Methicillin resistance NOT DETECTED NOT DETECTED   Streptococcus species NOT DETECTED NOT DETECTED   Streptococcus agalactiae NOT DETECTED NOT DETECTED   Streptococcus pneumoniae NOT DETECTED NOT DETECTED   Streptococcus pyogenes NOT DETECTED NOT DETECTED   Acinetobacter baumannii NOT DETECTED NOT DETECTED   Enterobacteriaceae species NOT DETECTED NOT DETECTED   Enterobacter cloacae complex NOT DETECTED NOT DETECTED   Escherichia coli NOT DETECTED NOT DETECTED   Klebsiella oxytoca NOT DETECTED NOT DETECTED   Klebsiella pneumoniae NOT DETECTED NOT DETECTED   Proteus species NOT DETECTED NOT DETECTED   Serratia marcescens NOT DETECTED NOT DETECTED   Carbapenem resistance NOT DETECTED NOT DETECTED   Haemophilus influenzae NOT DETECTED NOT DETECTED   Neisseria meningitidis NOT DETECTED NOT DETECTED   Pseudomonas aeruginosa NOT DETECTED NOT DETECTED   Candida albicans NOT DETECTED NOT DETECTED   Candida glabrata NOT DETECTED NOT DETECTED   Candida krusei NOT DETECTED NOT DETECTED   Candida parapsilosis NOT DETECTED NOT DETECTED   Candida tropicalis NOT DETECTED NOT DETECTED    Name of physician (or Provider) Contacted: Dr. Alfredia Ferguson  Changes to prescribed antibiotics required: Started on Zosyn and vancomycin upon admission for R foot cellulitis. Can likely deescalate to Ancef, ID to see.   Vincenza Hews, PharmD, BCPS 10/20/2016, 8:20 AM

## 2016-10-20 NOTE — Consult Note (Signed)
[facility] for Infectious Disease    Date of Admission:  10/19/2016     Total days of antibiotics 2  Vancomycin 9/13 - 9/14  Zosyn 9/13 - 9/14               Reason for Consult: MSSA Bacteremia     Referring Provider: Jacklyn Shell  Primary Care Provider: Alfredia Ferguson  Assessment: MSSA bacteremia   Cellulitis of right foot with ulcer of toe   MRI of R foot w/o evidence of osteomyelitis or fluid collection   [place] with wound care - recommended ortho consult for debridement   Pre-albumin low  Has had neuropathy "for years"   Prediabetes  HgbA1C - 6% this admission   Encephalopathy   Improved - seems appropriate and oriented during discussion. Daughter agrees.   Parkinson Disease     Plan: 1. Stop Vancomycin and Zosyn - Start Ancef  2. Will re-check BCx tomorrow AM  3. Would check TTE - wears a significant amount of oxygen (10-15 L) at home for pulmonary fibrosis; uncertain if he would tolerate sedation with TEE as he desaturated with lowering head of bed 4. Check ABI of RLE 5. Agree with ortho/podiatry consult for debridement of ulcer and wound care    Active Problems:   Cellulitis   Sepsis ([place])   . alfuzosin  10 mg Oral Daily  . atorvastatin  20 mg Oral Daily  . busPIRone  5 mg Oral BID  . carbidopa-levodopa  1 tablet Oral QHS  . carbidopa-levodopa  2 tablet Oral TID WC  . clopidogrel  75 mg Oral Daily  . enoxaparin (LOVENOX) injection  40 mg Subcutaneous Q24H  . finasteride  5 mg Oral QPM  . fluticasone  1 spray Each Nare Daily  . gabapentin  100 mg Oral BID   And  . gabapentin  400 mg Oral QHS  . levothyroxine  150 mcg Oral QAC breakfast  . mirabegron ER  50 mg Oral QPM  . mometasone-formoterol  2 puff Inhalation BID  . multivitamin  1 tablet Oral Daily  . multivitamin with minerals  1 tablet Oral Q1200  . mupirocin cream   Topical Daily  . mupirocin ointment  1 application Nasal BID  . sodium chloride flush  3 mL  Intravenous Q12H  . sodium chloride flush  3 mL Intravenous Q12H  . traZODone  100 mg Oral QHS  . umeclidinium bromide  1 puff Inhalation Daily    HPI: Bradley Bennett is a 83 y.o. male that presented to [facility] ER by his daughter on 9/13 with increased confusion and redness to his right foot that has been present for about 7 days wiith ulcer at the lateral aspect of the first toe.   Of note approx 1 week ago he experienced respiratory arrest after he ran out of his supplemental oxygen and was found down in a parking lot - was resuscitated and stabilized at hospital and discharged home. He has continued to be disoriented and confused.   Hospital Course: in ER found to be febrile (103.3 deg), tachycardic and tachypneic. WBC normal 7.3. BCx drawn and empiric vanc/zosyn started. BP stable. 2/2 BCx with GPC in clusters; BCID + MSSA.   His daughter and significant other are in the room with him. Report that the ulcer has been present for "a little longer than a week" but uncertain as to when it initially presented. No dental problems (full dentures). No  cardiac device in place. All native joints and he has no hardware implantables. Currently feeling better on antibiotics. No fevers/chills today. No diarrhea, nausea, vomiting. Does endorse some back pain but of the same quality and caliber he has had for years (mostly intolerant lying flat on back on hard surface).   Review of Systems: Review of Systems  Constitutional: Positive for fever. Negative for chills.  Respiratory: Negative for cough and sputum production.   Cardiovascular: Negative for chest pain, orthopnea and leg swelling.  Gastrointestinal: Negative for diarrhea, nausea and vomiting.  Genitourinary: Negative for dysuria.  Musculoskeletal: Positive for back pain (same quality dull back pain lying in bed he has had for years). Negative for joint pain.       Ulcer on R great toe   Skin: Negative for rash.  Neurological: Negative for  dizziness and headaches.    Past Medical History:  Diagnosis Date  . Arthritis   . Asthma   . Coronary artery disease   . Emphysema of lung ([place])   . Hearing difficulty of both ears   . Heart attack ([place])   . Heart murmur   . Hyperlipidemia   . Hypertension   . ILD (interstitial lung disease) ([place])   . Parkinson disease [facility])     Social History  Substance Use Topics  . Smoking status: Former Smoker    Packs/day: 2.00    Years: 35.00    Quit date: 08/15/1985  . Smokeless tobacco: Never Used     Comment: quit smoking in 1992  . Alcohol use 0.0 oz/week     Comment: twice every 6 months    Family History  Problem Relation Age of Onset  . Colon cancer Father   . Stroke Father   . Arthritis Mother   . Arthritis Sister   . Heart disease Brother   . Kidney cancer Brother    Allergies  Allergen Reactions  . Isosorbide Other (See Comments)    Reaction:  Headaches     OBJECTIVE: Blood pressure 110/76, pulse 80, temperature 98.2 F (36.8 C), temperature source Oral, resp. rate 15, weight 180 lb 1.9 oz (81.7 kg), SpO2 92 %.  Physical Exam  Constitutional: He is oriented to person, place, and time and well-developed, well-nourished, and in no distress.  Sitting up in bed. Comfortable. Desaturates to mid 7s with dropping head of bed. Daughter and significant other at the bedside.   HENT:  Mouth/Throat: Oropharynx is clear and moist.  Eyes: Pupils are equal, round, and reactive to light.  Cardiovascular: Normal rate, regular rhythm, normal heart sounds and intact distal pulses.   No murmur heard. Pulmonary/Chest: Effort normal. No respiratory distress.  Coarse breath sounds throughout  Abdominal: Soft. Bowel sounds are normal. He exhibits no distension. There is no tenderness.  Neurological: He is alert and oriented to person, place, and time.  Skin: Skin is warm and dry.   Right great toe: warmth, erythema and blisters surrounding ulcer bed. No sensation to either foot  with sharp pressure.         Lab Results Lab Results  Component Value Date   WBC 6.5 10/19/2016   HGB 13.2 10/19/2016   HCT 41.2 10/19/2016   MCV 89.6 10/19/2016   PLT 250 10/19/2016    Lab Results  Component Value Date   CREATININE 0.87 10/19/2016   BUN 14 10/19/2016   NA 134 (L) 10/19/2016   K 4.0 10/19/2016   CL 102 10/19/2016   CO2 25 10/19/2016  Lab Results  Component Value Date   ALT 16 (L) 10/19/2016   AST 22 10/19/2016   ALKPHOS 83 10/19/2016   BILITOT 0.9 10/19/2016     Microbiology: Recent Results (from the past 240 hour(s))  Culture, blood (Routine x 2)     Status: None (Preliminary result)   Collection Time: 10/19/16  2:30 PM  Result Value Ref Range Status   Specimen Description BLOOD LEFT ARM  Final   Special Requests   Final    BOTTLES DRAWN AEROBIC AND ANAEROBIC Blood Culture adequate volume   Culture  Setup Time   Final    GRAM POSITIVE COCCI IN CLUSTERS IN BOTH AEROBIC AND ANAEROBIC BOTTLES CRITICAL RESULT CALLED TO, READ BACK BY AND VERIFIED WITH: PHARMD A JOHNSTON [id number] MLM    Culture   Final    GRAM POSITIVE COCCI IN CLUSTERS CULTURE REINCUBATED FOR BETTER GROWTH    Report Status PENDING  Incomplete  Blood Culture ID Panel (Reflexed)     Status: Abnormal   Collection Time: 10/19/16  2:30 PM  Result Value Ref Range Status   Enterococcus species NOT DETECTED NOT DETECTED Final   Vancomycin resistance NOT DETECTED NOT DETECTED Final   Listeria monocytogenes NOT DETECTED NOT DETECTED Final   Staphylococcus species DETECTED (A) NOT DETECTED Final    Comment: CRITICAL RESULT CALLED TO, READ BACK BY AND VERIFIED WITH: PHARMD A JOHNSTON [id number] MLM    Staphylococcus aureus DETECTED (A) NOT DETECTED Final    Comment: CRITICAL RESULT CALLED TO, READ BACK BY AND VERIFIED WITH: PHARMD A JOHNSTON [id number] MLM    Methicillin resistance NOT DETECTED NOT DETECTED Final   Streptococcus species NOT DETECTED NOT DETECTED Final    Streptococcus agalactiae NOT DETECTED NOT DETECTED Final   Streptococcus pneumoniae NOT DETECTED NOT DETECTED Final   Streptococcus pyogenes NOT DETECTED NOT DETECTED Final   Acinetobacter baumannii NOT DETECTED NOT DETECTED Final   Enterobacteriaceae species NOT DETECTED NOT DETECTED Final   Enterobacter cloacae complex NOT DETECTED NOT DETECTED Final   Escherichia coli NOT DETECTED NOT DETECTED Final   Klebsiella oxytoca NOT DETECTED NOT DETECTED Final   Klebsiella pneumoniae NOT DETECTED NOT DETECTED Final   Proteus species NOT DETECTED NOT DETECTED Final   Serratia marcescens NOT DETECTED NOT DETECTED Final   Carbapenem resistance NOT DETECTED NOT DETECTED Final   Haemophilus influenzae NOT DETECTED NOT DETECTED Final   Neisseria meningitidis NOT DETECTED NOT DETECTED Final   Pseudomonas aeruginosa NOT DETECTED NOT DETECTED Final   Candida albicans NOT DETECTED NOT DETECTED Final   Candida glabrata NOT DETECTED NOT DETECTED Final   Candida krusei NOT DETECTED NOT DETECTED Final   Candida parapsilosis NOT DETECTED NOT DETECTED Final   Candida tropicalis NOT DETECTED NOT DETECTED Final  Culture, blood (Routine x 2)     Status: None (Preliminary result)   Collection Time: 10/19/16  2:41 PM  Result Value Ref Range Status   Specimen Description BLOOD RIGHT ARM  Final   Special Requests   Final    BOTTLES DRAWN AEROBIC AND ANAEROBIC Blood Culture adequate volume   Culture  Setup Time   Final    GRAM POSITIVE COCCI IN CLUSTERS IN BOTH AEROBIC AND ANAEROBIC BOTTLES CRITICAL VALUE NOTED.  VALUE IS CONSISTENT WITH PREVIOUSLY REPORTED AND CALLED VALUE.    Culture GRAM POSITIVE COCCI IN CLUSTERS  Final   Report Status PENDING  Incomplete  MRSA PCR Screening     Status: None   Collection Time: 10/20/16  12:11 AM  Result Value Ref Range Status   MRSA by PCR NEGATIVE NEGATIVE Final    Comment:        The GeneXpert MRSA Assay (FDA approved for NASAL specimens only), is one component of  a comprehensive MRSA colonization surveillance program. It is not intended to diagnose MRSA infection nor to guide or monitor treatment for MRSA infections.    IMAGING:  R Foot MRI 10/20/16 IMPRESSION: 1. Soft tissue ulceration along the medial aspect of the great toe at the level of the first distal phalanx. No evidence of osteomyelitis or drainable fluid collection.   Janene Madeira, MSN, NP-C [facility] [place] [facility] Pager: [phone]  10/20/2016 2:11 PM

## 2016-10-20 NOTE — Progress Notes (Signed)
RT called to PT bedside due to pt desatting on regular nasal canula 4L, switched pt to salter HFNC on 8L, pt sats 93-95. HR 95 RR 23, pt complains of no pain nor SOB. BS are diminished. RN aware. RT to cont to monitor as needed.

## 2016-10-20 NOTE — Consult Note (Addendum)
[facility] Nurse wound consult note Reason for Consult: Consult requested for right foot wound.  Pt does not know how the wound occurred or how long it has been present.  MRI and ABI is pending, according  To the EMR. Pt denies pain to the location and appears to have neuropathy. Wound type: Right great toe plantar and inner foot area with full thickness wound. Measurement: 4X3.5cm; 90% dark brown tightly adhered slough/eschar, 10% red, surrounded by raised clear fluid-filled blisters and generalized edema and erythremia.   Drainage (amount, consistency, odor) There is no odor, drainage, or fluctuance when probed with a swab Periwound: White macerated skin surrounding the wound bed to 1 cm,  Erythremia extends to anterior foot and has been marked with a line; daughter states the area has receeded slightly since antibiotics were started.   Dressing procedure/placement/frequency: Pt could benefit from ortho consult for possible debridement of nonviable tissue, text message sent to the primary team. Bactroban applied to promote moist healing until after the MRI has been completed and results of ABI are available. Discussed plan of care with patient and daughter at the bedside. Please re-consult if further assistance is needed.  Thank-you,  Julien Girt MSN, [place], [place], [place], [place]

## 2016-10-20 NOTE — Progress Notes (Signed)
PROGRESS NOTE    Bradley Bennett  HCW:[id number] DOB: 26-Apr-1932 DOA: 10/19/2016 PCP: [name], MD   Brief Narrative: Bradley Bennett is a 83 y.o. male with medical history significant of interstitial lung disease with significant chronic hypoxic respiratory failure who presents with right foot erythema and altered mentation. Patient was brought by his daughter after finding him to be confused. Apparently one week ago he had a respiratory arrest after running out of his supplemental oxygen, he was found down on a parking lot, required bag valve mask ventilation, brought to the hospital stabilized and discharged home. Since then patient has been disoriented, and confused, this symptoms have been persistent and progressive, no improving or worsening factors, no other associated symptoms. Today he was found by his daughter to be more confused, she noticed significant redness of his right foot and brought him to the hospital.  Unable to obtain further history from patient due to confusion, he reports having the foot redness for 7 days, been nonpainful. Most information was been obtained from his daughter at the bedside. Patient seen this AM and was extremely hard of hearing and still slightly confused. Blood Cx revealed that he had a MSSA Bacteremia so ID was consulted. [place] Nurse evaluated wound and recommended Ortho Consultation. Ortho Consulted and will see patient in AM.   Assessment & Plan:   Active Problems:   Hyperlipidemia   COPD (chronic obstructive pulmonary disease) (HCC)   Postinflammatory pulmonary fibrosis (HCC)   Parkinson disease (HCC)   Hypothyroidism   Dyspnea   Prediabetes   Cellulitis   Sepsis ([place])   Bacteremia due to methicillin susceptible Staphylococcus aureus (MSSA)   Hyponatremia  Sepsis due to right foot cellulitis and MSSA Bacteremia.  -Admitted to SDU -Sepsis Physiology is improving -Will continue IV fluids with isotonic crystalloid solutions at 100 mL per  hour -Broad-spectrum antibiotic therapy with IV vancomycin and IV Zosyn narrowed to ANCEF.  -Will keep mean arterial pressure above 65.  -Follow up WBC was 5.6 -Temperature improved to 99.2 from 103.3 -Right Foot MRI showed Soft tissue ulceration along the medial aspect of the great toe at the level of the first distal phalanx. No evidence of osteomyelitis or drainable fluid collection -Blood Cx Obtained and showed Gram Positive Cocci in Clusters with Methicillin Sensitive Staph Aureus grown -CRP was 5.7 and ESR was 71 -ID Consulted for further evaluation and narrowed IV Abx to Ancef -WOC evaluated and Recommending Ortho for Debridement -Consulted Orthopedics Dr. Lorin Mercy and will see patient in AM -Blood Cx being repeated tomorrow -ID recommends checking TTE (As below) and unsure whether patient would tolerate TEE due to Lung Disease  Toxic Metabolic Encephalopathy, improved -Related to sepsis, continue neuro checks per unit protocol. -Improved  Acute on chronic hypoxic respiratory failure from Severe Pulmonary Fibrosis and ILD -Will continue oxygen supplementation, per nasal cannula, to target oxygen saturation greater than 88%.  -Chest x-ray showed bilateral interstitial infiltrates unchanged from prior films. -C/w Dulera and Incruse  Parkinson's Disease.  -Continue Sinemet  -Physical therapy once patient more stable.  -Continue trazodone, buspirone, gabapentin.   Hypothyroid.  -Continue levothyroxine.  Hyponatremia -Mild at 134 -Continue to Monitor and repeat CMP in AM   Pre-Diabetes -HbA1c was 6.0 -If Blood Sugars Consistently >200 will need SSI  Neuropathy -C/w Gabapentin  HLD -C/w Statin   DVT prophylaxis: Enoxaparin 40 mg sq q24h Code Status: Partial Code (No CPR) Family Communication: No Family present at bedside Disposition Plan: Remain in SDU  Consultants:   Infectious Diseases  Orthopedic Surgery  WOC Nurse    Procedures:  ECHOCARDIOGRAM Study  Conclusions  - Left ventricle: The cavity size was normal. There was mild   concentric hypertrophy. Systolic function was normal. The   estimated ejection fraction was in the range of 60% to 65%. Wall   motion was normal; there were no regional wall motion   abnormalities. Doppler parameters are consistent with abnormal   left ventricular relaxation (grade 1 diastolic dysfunction). - Aortic valve: Transvalvular velocity was within the normal range.   There was no stenosis. There was no regurgitation. - Mitral valve: Transvalvular velocity was within the normal range.   There was no evidence for stenosis. There was trivial   regurgitation. - Left atrium: The atrium was moderately dilated. - Right ventricle: The cavity size was normal. Wall thickness was   normal. Systolic function was normal. - Tricuspid valve: There was mild regurgitation. - Pulmonary arteries: Systolic pressure was moderately to severely   increased. PA peak pressure: 56 mm Hg (S).  Antimicrobials:  Anti-infectives    Start     Dose/Rate Route Frequency Ordered Stop   10/20/16 1500  ceFAZolin (ANCEF) IVPB 2g/100 mL premix     2 g 200 mL/hr over 30 Minutes Intravenous Every 8 hours 10/20/16 1422     10/19/16 2200  piperacillin-tazobactam (ZOSYN) IVPB 3.375 g  Status:  Discontinued     3.375 g 12.5 mL/hr over 240 Minutes Intravenous Every 8 hours 10/19/16 1620 10/20/16 1422   10/19/16 2200  vancomycin (VANCOCIN) IVPB 1000 mg/200 mL premix  Status:  Discontinued     1,000 mg 200 mL/hr over 60 Minutes Intravenous Every 12 hours 10/19/16 1620 10/20/16 1422   10/19/16 1445  piperacillin-tazobactam (ZOSYN) IVPB 3.375 g     3.375 g 100 mL/hr over 30 Minutes Intravenous  Once 10/19/16 1442 10/19/16 1556   10/19/16 1445  vancomycin (VANCOCIN) IVPB 1000 mg/200 mL premix     1,000 mg 200 mL/hr over 60 Minutes Intravenous  Once 10/19/16 1442 10/19/16 1615     Subjective: Seen and examined and is extremely hard of  hearing. States he didn't know how he hurt his foot. Also stated that he felt better and denied CP or SOB. No other complaints or concerns at this time.   Objective: Vitals:   10/20/16 0749 10/20/16 0800 10/20/16 1250 10/20/16 1653  BP:  110/76  136/65  Pulse:   80 87  Resp:   15 (!) 23  Temp:  98.2 F (36.8 C)  99.2 F (37.3 C)  TempSrc:  Oral Oral Oral  SpO2: 97%  92% 90%  Weight:        Intake/Output Summary (Last 24 hours) at 10/20/16 1858 Last data filed at 10/20/16 0946  Gross per 24 hour  Intake           563.33 ml  Output              300 ml  Net           263.33 ml   Filed Weights   10/19/16 1443 10/20/16 0033  Weight: 85.3 kg (188 lb) 81.7 kg (180 lb 1.9 oz)   Examination: Physical Exam:  Constitutional: WN/WD Caucasian male in NAD and appears calm and comfortable Eyes:  Lids and conjunctivae normal, sclerae anicteric  ENMT: External Ears, Nose appear normal. Extremely Hard of hearing bilaterally. Mucous membranes were moist.  Neck: Appears normal, supple, no cervical masses, normal ROM, no  appreciable thyromegaly, no JVD Respiratory: Diminished to auscultation bilaterall with some rales, no wheezing, rhonchi or crackles. Normal respiratory effort and patient is not tachypenic. No accessory muscle use. Wearing supplemental O2 via [place].  Cardiovascular: RRR, no murmurs / rubs / gallops. S1 and S2 auscultated. No extremity edema. Abdomen: Soft, non-tender, non-distended. No masses palpated. No appreciable hepatosplenomegaly. Bowel sounds positive.  GU: Deferred. Musculoskeletal: No clubbing / cyanosis of digits/nails. No joint deformity upper and lower extremities. Good ROM, no contractures.  Skin: Right Foot Toe with necrotic ulceration with minimal erythema and some edema.  Neurologic: Extremely hard of Hearing. No other appreciable Focal neurological deficits Psychiatric: Normal judgment and insight. Alert and oriented x 3. Normal mood and appropriate affect.    Data Reviewed: I have personally reviewed following labs and imaging studies  CBC:  Recent Labs Lab 10/19/16 1425 10/19/16 2215 10/20/16 1419  WBC 7.3 6.5 5.6  NEUTROABS 6.0 5.2 3.9  HGB 14.3 13.2 12.0*  HCT 44.8 41.2 36.4*  MCV 89.6 89.6 89.9  PLT 284 250 825   Basic Metabolic Panel:  Recent Labs Lab 10/19/16 1425 10/19/16 2215 10/19/16 2342 10/20/16 1419  NA 135 135 134* 134*  K 4.3 3.9 4.0 3.5  CL 100* 103 102 102  CO2 '25 23 25 27  ' GLUCOSE 99 97 111* 126*  BUN '16 15 14 11  ' CREATININE 0.96 1.03 0.87 0.89  CALCIUM 8.9 8.3* 8.3* 8.2*  MG  --   --   --  1.7  PHOS  --   --   --  2.6   GFR: Estimated Creatinine Clearance: 67.2 mL/min (by C-G formula based on SCr of 0.89 mg/dL). Liver Function Tests:  Recent Labs Lab 10/19/16 1425 10/19/16 2342 10/20/16 1419  AST '21 22 27  ' ALT 6* 16* 7*  ALKPHOS 94 83 75  BILITOT 0.7 0.9 0.8  PROT 7.2 6.5 5.6*  ALBUMIN 3.4* 2.7* 2.4*   No results for input(s): LIPASE, AMYLASE in the last 168 hours. No results for input(s): AMMONIA in the last 168 hours. Coagulation Profile:  Recent Labs Lab 10/19/16 1425  INR 0.99   Cardiac Enzymes: No results for input(s): CKTOTAL, CKMB, CKMBINDEX, TROPONINI in the last 168 hours. BNP (last 3 results) No results for input(s): PROBNP in the last 8760 hours. HbA1C:  Recent Labs  10/19/16 2342  HGBA1C 6.0*   CBG: No results for input(s): GLUCAP in the last 168 hours. Lipid Profile: No results for input(s): CHOL, HDL, LDLCALC, TRIG, CHOLHDL, LDLDIRECT in the last 72 hours. Thyroid Function Tests: No results for input(s): TSH, T4TOTAL, FREET4, T3FREE, THYROIDAB in the last 72 hours. Anemia Panel: No results for input(s): VITAMINB12, FOLATE, FERRITIN, TIBC, IRON, RETICCTPCT in the last 72 hours. Sepsis Labs:  Recent Labs Lab 10/19/16 1458 10/19/16 1832  LATICACIDVEN 1.81 0.68    Recent Results (from the past 240 hour(s))  Culture, blood (Routine x 2)     Status: None  (Preliminary result)   Collection Time: 10/19/16  2:30 PM  Result Value Ref Range Status   Specimen Description BLOOD LEFT ARM  Final   Special Requests   Final    BOTTLES DRAWN AEROBIC AND ANAEROBIC Blood Culture adequate volume   Culture  Setup Time   Final    GRAM POSITIVE COCCI IN CLUSTERS IN BOTH AEROBIC AND ANAEROBIC BOTTLES CRITICAL RESULT CALLED TO, READ BACK BY AND VERIFIED WITH: PHARMD A JOHNSTON [id number] MLM    Culture   Final    Lonell Grandchild  POSITIVE COCCI IN CLUSTERS CULTURE REINCUBATED FOR BETTER GROWTH    Report Status PENDING  Incomplete  Blood Culture ID Panel (Reflexed)     Status: Abnormal   Collection Time: 10/19/16  2:30 PM  Result Value Ref Range Status   Enterococcus species NOT DETECTED NOT DETECTED Final   Vancomycin resistance NOT DETECTED NOT DETECTED Final   Listeria monocytogenes NOT DETECTED NOT DETECTED Final   Staphylococcus species DETECTED (A) NOT DETECTED Final    Comment: CRITICAL RESULT CALLED TO, READ BACK BY AND VERIFIED WITH: PHARMD A JOHNSTON [id number] MLM    Staphylococcus aureus DETECTED (A) NOT DETECTED Final    Comment: CRITICAL RESULT CALLED TO, READ BACK BY AND VERIFIED WITH: PHARMD A JOHNSTON [id number] [phone] MLM    Methicillin resistance NOT DETECTED NOT DETECTED Final   Streptococcus species NOT DETECTED NOT DETECTED Final   Streptococcus agalactiae NOT DETECTED NOT DETECTED Final   Streptococcus pneumoniae NOT DETECTED NOT DETECTED Final   Streptococcus pyogenes NOT DETECTED NOT DETECTED Final   Acinetobacter baumannii NOT DETECTED NOT DETECTED Final   Enterobacteriaceae species NOT DETECTED NOT DETECTED Final   Enterobacter cloacae complex NOT DETECTED NOT DETECTED Final   Escherichia coli NOT DETECTED NOT DETECTED Final   Klebsiella oxytoca NOT DETECTED NOT DETECTED Final   Klebsiella pneumoniae NOT DETECTED NOT DETECTED Final   Proteus species NOT DETECTED NOT DETECTED Final   Serratia marcescens NOT DETECTED NOT DETECTED Final     Carbapenem resistance NOT DETECTED NOT DETECTED Final   Haemophilus influenzae NOT DETECTED NOT DETECTED Final   Neisseria meningitidis NOT DETECTED NOT DETECTED Final   Pseudomonas aeruginosa NOT DETECTED NOT DETECTED Final   Candida albicans NOT DETECTED NOT DETECTED Final   Candida glabrata NOT DETECTED NOT DETECTED Final   Candida krusei NOT DETECTED NOT DETECTED Final   Candida parapsilosis NOT DETECTED NOT DETECTED Final   Candida tropicalis NOT DETECTED NOT DETECTED Final  Culture, blood (Routine x 2)     Status: None (Preliminary result)   Collection Time: 10/19/16  2:41 PM  Result Value Ref Range Status   Specimen Description BLOOD RIGHT ARM  Final   Special Requests   Final    BOTTLES DRAWN AEROBIC AND ANAEROBIC Blood Culture adequate volume   Culture  Setup Time   Final    GRAM POSITIVE COCCI IN CLUSTERS IN BOTH AEROBIC AND ANAEROBIC BOTTLES CRITICAL VALUE NOTED.  VALUE IS CONSISTENT WITH PREVIOUSLY REPORTED AND CALLED VALUE.    Culture GRAM POSITIVE COCCI IN CLUSTERS  Final   Report Status PENDING  Incomplete  MRSA PCR Screening     Status: None   Collection Time: 10/20/16 12:11 AM  Result Value Ref Range Status   MRSA by PCR NEGATIVE NEGATIVE Final    Comment:        The GeneXpert MRSA Assay (FDA approved for NASAL specimens only), is one component of a comprehensive MRSA colonization surveillance program. It is not intended to diagnose MRSA infection nor to guide or monitor treatment for MRSA infections.     Radiology Studies: Dg Chest 2 View  Result Date: 10/19/2016 CLINICAL DATA:  Shortness of breath. History of emphysema and hypertension. EXAM: CHEST  2 VIEW COMPARISON:  Chest radiograph October 10, 2016 FINDINGS: Cardiac silhouette is similarly enlarged. Calcified aortic knob. Status post median sternotomy for CABG with coronary artery stent. Diffuse interstitial prominence with RIGHT greater LEFT apical bullous changes. Flattened hemidiaphragms. Similar  scarring RIGHT mid lung zone. No pleural effusion or focal  consolidation. No pneumothorax. Soft tissue planes and included osseous structures are unchanged. IMPRESSION: Stable interstitial prominence concerning for chronic interstitial lung disease superimposed on COPD. Stable cardiomegaly, status post CABG and coronary artery stenting. Aortic Atherosclerosis (ICD10-I70.0). Electronically Signed   By: Elon Alas M.D.   On: 10/19/2016 15:45   Mr Foot Right W Wo Contrast  Result Date: 10/20/2016 CLINICAL DATA:  Left great toe ulcer.  Evaluate for osteomyelitis. EXAM: MRI OF THE RIGHT FOREFOOT WITHOUT AND WITH CONTRAST TECHNIQUE: Multiplanar, multisequence MR imaging of the right forefoot was performed before and after the administration of intravenous contrast. CONTRAST:  69m MULTIHANCE GADOBENATE DIMEGLUMINE 529 MG/ML IV SOLN COMPARISON:  Right foot x-rays from yesterday. FINDINGS: Bones/Joint/Cartilage No suspicious marrow signal abnormality. Mild degenerative changes of the first MTP and IP joints. No fracture or dislocation. Ligaments The Lisfranc ligament is intact. The collateral ligaments are intact. Muscles and Tendons Severe fatty atrophy of the intrinsic muscles of the forefoot. The visualized flexor and extensor tendons are intact. Soft tissues Soft tissue ulceration along the medial aspect of the first distal phalanx. Mild surrounding soft tissue enhancement. No drainable fluid collection IMPRESSION: 1. Soft tissue ulceration along the medial aspect of the great toe at the level of the first distal phalanx. No evidence of osteomyelitis or drainable fluid collection. Electronically Signed   By: WTitus DubinM.D.   On: 10/20/2016 12:21   Dg Foot Complete Right  Result Date: 10/19/2016 CLINICAL DATA:  Right great toe infection. Evaluate for gas in soft tissue. EXAM: RIGHT FOOT COMPLETE - 3+ VIEW COMPARISON:  None. FINDINGS: Negative for gas or foreign body in the great toe, or elsewhere. No  visible ulcer or osteomyelitis. No acute fracture. Mild first metatarsal head irregularity that is likely degenerative. IMPRESSION: Negative for soft tissue gas, opaque foreign body, or acute osseous finding. Electronically Signed   By: JMonte FantasiaM.D.   On: 10/19/2016 15:42   Scheduled Meds: . alfuzosin  10 mg Oral Daily  . atorvastatin  20 mg Oral Daily  . busPIRone  5 mg Oral BID  . carbidopa-levodopa  1 tablet Oral QHS  . carbidopa-levodopa  2 tablet Oral TID WC  . clopidogrel  75 mg Oral Daily  . enoxaparin (LOVENOX) injection  40 mg Subcutaneous Q24H  . finasteride  5 mg Oral QPM  . fluticasone  1 spray Each Nare Daily  . gabapentin  100 mg Oral BID   And  . gabapentin  400 mg Oral QHS  . lactose free nutrition  237 mL Oral TID WC  . levothyroxine  150 mcg Oral QAC breakfast  . mirabegron ER  50 mg Oral QPM  . mometasone-formoterol  2 puff Inhalation BID  . multivitamin  1 tablet Oral Daily  . multivitamin with minerals  1 tablet Oral Q1200  . mupirocin cream   Topical Daily  . mupirocin ointment  1 application Nasal BID  . sodium chloride flush  3 mL Intravenous Q12H  . sodium chloride flush  3 mL Intravenous Q12H  . traZODone  100 mg Oral QHS  . umeclidinium bromide  1 puff Inhalation Daily   Continuous Infusions: . sodium chloride    . sodium chloride 100 mL/hr at 10/20/16 0527  .  ceFAZolin (ANCEF) IV Stopped (10/20/16 1649)    LOS: 1 day   OKerney Elbe DO [facility] Pager [phone] If 7PM-7AM, please contact night-coverage [email or web address] Password [facility]9/14/2018, 6:58 PM

## 2016-10-20 NOTE — Progress Notes (Signed)
  Echocardiogram 2D Echocardiogram has been performed.  Bradley Bennett 10/20/2016, 4:33 PM

## 2016-10-20 NOTE — Progress Notes (Signed)
Initial Nutrition Assessment  DOCUMENTATION CODES:   Not applicable  INTERVENTION:  - Boost Plus TID, each supplement provides 360 kcal and 14 grams protein  NUTRITION DIAGNOSIS:   Increased nutrient needs related to wound healing as evidenced by estimated needs.  GOAL:   Patient will meet greater than or equal to 90% of their needs  MONITOR:   PO intake, Supplement acceptance, Weight trends, I & O's  REASON FOR ASSESSMENT:   Consult Wound healing  ASSESSMENT:   Pt with recent admission s/p respiratory arrest and PMH of HTN, HLD, CAD, hearing difficulty, ILD, Parkinson disease presents by daughter with right foot erythema and altered mentation. Pt found to be septic d/t right foot cellulitis.   Per chart review pt presents from [facility].  Spoke with pt and pt's friend at bedside. Pt reports no recent weight loss, stating UBW between 180-185 lb. Pt current weight is 180 lb.  Pt reports a good PO intake PTA, consuming 75-100% of meals. Pt reports PTA he was eating 3 meals/d and at least 1 Boost/d. Only one meal percentage was recorded of 25%, pt reports consuming 75% of lunch.   Pt amenable to nutritional supplementation during admission given increased needs to aid in wound healing.   Labs reviewed; Na 134, Hemoglobin A1C 6.0 Medications reviewed; Prosight, multivitamin with minerals  RD unable to complete nutrition focused physical exam at this time. Friend at bedside pt deferred.   Diet Order:  Diet Heart Room service appropriate? Yes; Fluid consistency: Thin  Skin:  Wound (see comment) (cellulitis to full thickness wound at right foot)  Last BM:  10/20/16  Height:   Ht Readings from Last 1 Encounters:  10/05/16 5' 10.5" (1.791 m)    Weight:   Wt Readings from Last 1 Encounters:  10/20/16 180 lb 1.9 oz (81.7 kg)    Ideal Body Weight:  76.8 kg  BMI:  Body mass index is 25.48 kg/m.  Estimated Nutritional Needs:   Kcal:   5638-7564  Protein:  102-122 grams  Fluid:  >/= 1.9 L/d  EDUCATION NEEDS:   Education needs no appropriate at this time  [place], MS, RDN, LDN 10/20/2016 3:31 PM

## 2016-10-20 NOTE — Progress Notes (Signed)
New Admission Note:   Arrival Method:From ED via stretcher  Mental Orientation: A&Ox4 Skin: R Foot cellulitits IV: R FA, LFA Pain: pt denies any pain Tubes: None Safety Measures: Safety Fall Prevention Plan has been discussed  Admission [facility] [place] Orientation: Patient has been orientated to the room, unit and staff.  Family: none present at bedside  Orders to be reviewed and implemented. Will continue to monitor the patient. Call light has been placed within reach and bed alarm has been activated. Pt placed on tele monitor. CCMD notified    Isac Caddy, RN

## 2016-10-21 DIAGNOSIS — L03031 Cellulitis of right toe: Secondary | ICD-10-CM

## 2016-10-21 LAB — COMPREHENSIVE METABOLIC PANEL
ALBUMIN: 2.7 g/dL — AB (ref 3.5–5.0)
ALT: 7 U/L — ABNORMAL LOW (ref 17–63)
ANION GAP: 6 (ref 5–15)
AST: 26 U/L (ref 15–41)
Alkaline Phosphatase: 78 U/L (ref 38–126)
BILIRUBIN TOTAL: 0.6 mg/dL (ref 0.3–1.2)
BUN: 7 mg/dL (ref 6–20)
CHLORIDE: 103 mmol/L (ref 101–111)
CO2: 27 mmol/L (ref 22–32)
Calcium: 8.3 mg/dL — ABNORMAL LOW (ref 8.9–10.3)
Creatinine, Ser: 0.72 mg/dL (ref 0.61–1.24)
GFR calc Af Amer: >60 mL/min (ref >60)
GFR calc non Af Amer: >60 mL/min (ref >60)
GLUCOSE: 88 mg/dL (ref 65–99)
POTASSIUM: 3.7 mmol/L (ref 3.5–5.1)
SODIUM: 136 mmol/L (ref 135–145)
TOTAL PROTEIN: 6.1 g/dL — AB (ref 6.5–8.1)

## 2016-10-21 LAB — CBC WITH DIFFERENTIAL/PLATELET
BASOS ABS: 0 10*3/uL (ref 0.0–0.1)
BASOS PCT: 0 %
EOS ABS: 0.3 10*3/uL (ref 0.0–0.7)
EOS PCT: 5 %
HCT: 38.2 % — ABNORMAL LOW (ref 39.0–52.0)
Hemoglobin: 12 g/dL — ABNORMAL LOW (ref 13.0–17.0)
Lymphocytes Relative: 21 %
Lymphs Abs: 1.1 10*3/uL (ref 0.7–4.0)
MCH: 28.2 pg (ref 26.0–34.0)
MCHC: 31.4 g/dL (ref 30.0–36.0)
MCV: 89.9 fL (ref 78.0–100.0)
MONO ABS: 0.7 10*3/uL (ref 0.1–1.0)
MONOS PCT: 12 %
Neutro Abs: 3.3 10*3/uL (ref 1.7–7.7)
Neutrophils Relative %: 62 %
PLATELETS: 249 10*3/uL (ref 150–400)
RBC: 4.25 MIL/uL (ref 4.22–5.81)
RDW: 17.5 % — AB (ref 11.5–15.5)
WBC: 5.4 10*3/uL (ref 4.0–10.5)

## 2016-10-21 LAB — PHOSPHORUS: Phosphorus: 2.8 mg/dL (ref 2.5–4.6)

## 2016-10-21 LAB — MAGNESIUM: Magnesium: 1.8 mg/dL (ref 1.7–2.4)

## 2016-10-21 NOTE — Consult Note (Signed)
Reason for Consult: Right great toe infection with cellulitis and staph bacteremia Referring Physician: Dr. Claybon Jabs  Bradley Bennett is an 83 y.o. male.  HPI: A 44-year-old male admitted 2 days ago with progressive respiratory distress running supplemental oxygen with confusion and elevated sedimentation rate, CRP and sepsis with positive blood cultures for staph. Patient had the right great toe erythema cellulitis that extended to the midfoot. MRI scan was negative for osteomyelitis or fluid collection. Soft tissue changes consistent with cellulitis was noted along the medial aspect of the great toe. Patient on IV antibiotics with improvement in cellulitis.  Past Medical History:  Diagnosis Date  . Arthritis   . Asthma   . Coronary artery disease   . Emphysema of lung ([place])   . Hearing difficulty of both ears   . Heart attack ([place])   . Heart murmur   . Hyperlipidemia   . Hypertension   . ILD (interstitial lung disease) ([place])   . Parkinson disease [facility])     Past Surgical History:  Procedure Laterality Date  . CARPAL TUNNEL RELEASE Right 03/2014  . CORONARY ANGIOPLASTY WITH STENT PLACEMENT    . CORONARY ARTERY BYPASS GRAFT  1992  . HERNIA REPAIR  10/2013   x3   . VEIN BYPASS SURGERY      Family History  Problem Relation Age of Onset  . Colon cancer Father   . Stroke Father   . Arthritis Mother   . Arthritis Sister   . Heart disease Brother   . Kidney cancer Brother     Social History:  reports that he quit smoking about 31 years ago. He has a 70.00 pack-year smoking history. He has never used smokeless tobacco. He reports that he drinks alcohol. He reports that he does not use drugs.  Allergies:  Allergies  Allergen Reactions  . Isosorbide Other (See Comments)    Reaction:  Headaches     Medications: I have reviewed the patient's current medications.  Results for orders placed or performed during the hospital encounter of 10/19/16 (from the past 48 hour(s))    Basic metabolic panel     Status: Abnormal   Collection Time: 10/19/16 10:15 PM  Result Value Ref Range   Sodium 135 135 - 145 mmol/L   Potassium 3.9 3.5 - 5.1 mmol/L   Chloride 103 101 - 111 mmol/L   CO2 23 22 - 32 mmol/L   Glucose, Bld 97 65 - 99 mg/dL   BUN 15 6 - 20 mg/dL   Creatinine, Ser 1.03 0.61 - 1.24 mg/dL   Calcium 8.3 (L) 8.9 - 10.3 mg/dL   GFR calc non Af Amer >60 >60 mL/min   GFR calc Af Amer >60 >60 mL/min    Comment: (NOTE) The eGFR has been calculated using the CKD EPI equation. This calculation has not been validated in all clinical situations. eGFR's persistently <60 mL/min signify possible Chronic Kidney Disease.    Anion gap 9 5 - 15  CBC with Differential     Status: Abnormal   Collection Time: 10/19/16 10:15 PM  Result Value Ref Range   WBC 6.5 4.0 - 10.5 K/uL   RBC 4.60 4.22 - 5.81 MIL/uL   Hemoglobin 13.2 13.0 - 17.0 g/dL   HCT 41.2 39.0 - 52.0 %   MCV 89.6 78.0 - 100.0 fL   MCH 28.7 26.0 - 34.0 pg   MCHC 32.0 30.0 - 36.0 g/dL   RDW 17.3 (H) 11.5 - 15.5 %  Platelets 250 150 - 400 K/uL   Neutrophils Relative % 78 %   Neutro Abs 5.2 1.7 - 7.7 K/uL   Lymphocytes Relative 9 %   Lymphs Abs 0.6 (L) 0.7 - 4.0 K/uL   Monocytes Relative 11 %   Monocytes Absolute 0.7 0.1 - 1.0 K/uL   Eosinophils Relative 2 %   Eosinophils Absolute 0.1 0.0 - 0.7 K/uL   Basophils Relative 0 %   Basophils Absolute 0.0 0.0 - 0.1 K/uL  HIV antibody     Status: None   Collection Time: 10/19/16 10:15 PM  Result Value Ref Range   HIV Screen 4th Generation wRfx Non Reactive Non Reactive    Comment: (NOTE) Performed At: [facility] [place], [place] [id number] Lindon Romp MD [id number]   Sedimentation rate     Status: Abnormal   Collection Time: 10/19/16 10:15 PM  Result Value Ref Range   Sed Rate 71 (H) 0 - 16 mm/hr  C-reactive protein     Status: Abnormal   Collection Time: 10/19/16 10:15 PM  Result Value Ref Range   CRP 5.7 (H) <1.0  mg/dL  Prealbumin     Status: Abnormal   Collection Time: 10/19/16 10:15 PM  Result Value Ref Range   Prealbumin 15.8 (L) 18 - 38 mg/dL  Comprehensive metabolic panel     Status: Abnormal   Collection Time: 10/19/16 11:42 PM  Result Value Ref Range   Sodium 134 (L) 135 - 145 mmol/L   Potassium 4.0 3.5 - 5.1 mmol/L   Chloride 102 101 - 111 mmol/L   CO2 25 22 - 32 mmol/L   Glucose, Bld 111 (H) 65 - 99 mg/dL   BUN 14 6 - 20 mg/dL   Creatinine, Ser 0.87 0.61 - 1.24 mg/dL   Calcium 8.3 (L) 8.9 - 10.3 mg/dL   Total Protein 6.5 6.5 - 8.1 g/dL   Albumin 2.7 (L) 3.5 - 5.0 g/dL   AST 22 15 - 41 U/L   ALT 16 (L) 17 - 63 U/L   Alkaline Phosphatase 83 38 - 126 U/L   Total Bilirubin 0.9 0.3 - 1.2 mg/dL   GFR calc non Af Amer >60 >60 mL/min   GFR calc Af Amer >60 >60 mL/min    Comment: (NOTE) The eGFR has been calculated using the CKD EPI equation. This calculation has not been validated in all clinical situations. eGFR's persistently <60 mL/min signify possible Chronic Kidney Disease.    Anion gap 7 5 - 15  Hemoglobin A1c     Status: Abnormal   Collection Time: 10/19/16 11:42 PM  Result Value Ref Range   Hgb A1c MFr Bld 6.0 (H) 4.8 - 5.6 %    Comment: (NOTE) Pre diabetes:          5.7%-6.4% Diabetes:              >6.4% Glycemic control for   <7.0% adults with diabetes    Mean Plasma Glucose 125.5 mg/dL  MRSA PCR Screening     Status: None   Collection Time: 10/20/16 12:11 AM  Result Value Ref Range   MRSA by PCR NEGATIVE NEGATIVE    Comment:        The GeneXpert MRSA Assay (FDA approved for NASAL specimens only), is one component of a comprehensive MRSA colonization surveillance program. It is not intended to diagnose MRSA infection nor to guide or monitor treatment for MRSA infections.   CBC with Differential/Platelet  Status: Abnormal   Collection Time: 10/20/16  2:19 PM  Result Value Ref Range   WBC 5.6 4.0 - 10.5 K/uL   RBC 4.05 (L) 4.22 - 5.81 MIL/uL    Hemoglobin 12.0 (L) 13.0 - 17.0 g/dL   HCT 36.4 (L) 39.0 - 52.0 %   MCV 89.9 78.0 - 100.0 fL   MCH 29.6 26.0 - 34.0 pg   MCHC 33.0 30.0 - 36.0 g/dL   RDW 17.6 (H) 11.5 - 15.5 %   Platelets 254 150 - 400 K/uL   Neutrophils Relative % 70 %   Neutro Abs 3.9 1.7 - 7.7 K/uL   Lymphocytes Relative 15 %   Lymphs Abs 0.8 0.7 - 4.0 K/uL   Monocytes Relative 11 %   Monocytes Absolute 0.6 0.1 - 1.0 K/uL   Eosinophils Relative 4 %   Eosinophils Absolute 0.2 0.0 - 0.7 K/uL   Basophils Relative 0 %   Basophils Absolute 0.0 0.0 - 0.1 K/uL  Comprehensive metabolic panel     Status: Abnormal   Collection Time: 10/20/16  2:19 PM  Result Value Ref Range   Sodium 134 (L) 135 - 145 mmol/L   Potassium 3.5 3.5 - 5.1 mmol/L   Chloride 102 101 - 111 mmol/L   CO2 27 22 - 32 mmol/L   Glucose, Bld 126 (H) 65 - 99 mg/dL   BUN 11 6 - 20 mg/dL   Creatinine, Ser 0.89 0.61 - 1.24 mg/dL   Calcium 8.2 (L) 8.9 - 10.3 mg/dL   Total Protein 5.6 (L) 6.5 - 8.1 g/dL   Albumin 2.4 (L) 3.5 - 5.0 g/dL   AST 27 15 - 41 U/L   ALT 7 (L) 17 - 63 U/L   Alkaline Phosphatase 75 38 - 126 U/L   Total Bilirubin 0.8 0.3 - 1.2 mg/dL   GFR calc non Af Amer >60 >60 mL/min   GFR calc Af Amer >60 >60 mL/min    Comment: (NOTE) The eGFR has been calculated using the CKD EPI equation. This calculation has not been validated in all clinical situations. eGFR's persistently <60 mL/min signify possible Chronic Kidney Disease.    Anion gap 5 5 - 15  Magnesium     Status: None   Collection Time: 10/20/16  2:19 PM  Result Value Ref Range   Magnesium 1.7 1.7 - 2.4 mg/dL  Phosphorus     Status: None   Collection Time: 10/20/16  2:19 PM  Result Value Ref Range   Phosphorus 2.6 2.5 - 4.6 mg/dL  CBC with Differential/Platelet     Status: Abnormal   Collection Time: 10/21/16  6:39 AM  Result Value Ref Range   WBC 5.4 4.0 - 10.5 K/uL   RBC 4.25 4.22 - 5.81 MIL/uL   Hemoglobin 12.0 (L) 13.0 - 17.0 g/dL   HCT 38.2 (L) 39.0 - 52.0 %    MCV 89.9 78.0 - 100.0 fL   MCH 28.2 26.0 - 34.0 pg   MCHC 31.4 30.0 - 36.0 g/dL   RDW 17.5 (H) 11.5 - 15.5 %   Platelets 249 150 - 400 K/uL   Neutrophils Relative % 62 %   Neutro Abs 3.3 1.7 - 7.7 K/uL   Lymphocytes Relative 21 %   Lymphs Abs 1.1 0.7 - 4.0 K/uL   Monocytes Relative 12 %   Monocytes Absolute 0.7 0.1 - 1.0 K/uL   Eosinophils Relative 5 %   Eosinophils Absolute 0.3 0.0 - 0.7 K/uL   Basophils Relative 0 %  Basophils Absolute 0.0 0.0 - 0.1 K/uL  Comprehensive metabolic panel     Status: Abnormal   Collection Time: 10/21/16  6:39 AM  Result Value Ref Range   Sodium 136 135 - 145 mmol/L   Potassium 3.7 3.5 - 5.1 mmol/L   Chloride 103 101 - 111 mmol/L   CO2 27 22 - 32 mmol/L   Glucose, Bld 88 65 - 99 mg/dL   BUN 7 6 - 20 mg/dL   Creatinine, Ser 0.72 0.61 - 1.24 mg/dL   Calcium 8.3 (L) 8.9 - 10.3 mg/dL   Total Protein 6.1 (L) 6.5 - 8.1 g/dL   Albumin 2.7 (L) 3.5 - 5.0 g/dL   AST 26 15 - 41 U/L   ALT 7 (L) 17 - 63 U/L   Alkaline Phosphatase 78 38 - 126 U/L   Total Bilirubin 0.6 0.3 - 1.2 mg/dL   GFR calc non Af Amer >60 >60 mL/min   GFR calc Af Amer >60 >60 mL/min    Comment: (NOTE) The eGFR has been calculated using the CKD EPI equation. This calculation has not been validated in all clinical situations. eGFR's persistently <60 mL/min signify possible Chronic Kidney Disease.    Anion gap 6 5 - 15  Magnesium     Status: None   Collection Time: 10/21/16  6:39 AM  Result Value Ref Range   Magnesium 1.8 1.7 - 2.4 mg/dL  Phosphorus     Status: None   Collection Time: 10/21/16  6:39 AM  Result Value Ref Range   Phosphorus 2.8 2.5 - 4.6 mg/dL    Mr Foot Right W Wo Contrast  Result Date: 10/20/2016 CLINICAL DATA:  Left great toe ulcer.  Evaluate for osteomyelitis. EXAM: MRI OF THE RIGHT FOREFOOT WITHOUT AND WITH CONTRAST TECHNIQUE: Multiplanar, multisequence MR imaging of the right forefoot was performed before and after the administration of intravenous  contrast. CONTRAST:  65m MULTIHANCE GADOBENATE DIMEGLUMINE 529 MG/ML IV SOLN COMPARISON:  Right foot x-rays from yesterday. FINDINGS: Bones/Joint/Cartilage No suspicious marrow signal abnormality. Mild degenerative changes of the first MTP and IP joints. No fracture or dislocation. Ligaments The Lisfranc ligament is intact. The collateral ligaments are intact. Muscles and Tendons Severe fatty atrophy of the intrinsic muscles of the forefoot. The visualized flexor and extensor tendons are intact. Soft tissues Soft tissue ulceration along the medial aspect of the first distal phalanx. Mild surrounding soft tissue enhancement. No drainable fluid collection IMPRESSION: 1. Soft tissue ulceration along the medial aspect of the great toe at the level of the first distal phalanx. No evidence of osteomyelitis or drainable fluid collection. Electronically Signed   By: WTitus DubinM.D.   On: 10/20/2016 12:21    Review of Systems  Constitutional: Positive for fever and malaise/fatigue.  Respiratory: Positive for shortness of breath.   Musculoskeletal:       Right great toe cellulitis with the medial great toe distal phalanx skin necrosis.  Neurological:       Positive for decreased mental alertness with confusion and sepsis.   positive for coronary artery disease decreased hearing acuity previous MI, hyperlipidemia, hypertension, interstitial lung disease, parkinsonism positive asthma with emphysema. Blood pressure 128/76, pulse 81, temperature 97.9 F (36.6 C), temperature source Oral, resp. rate (!) 23, weight 186 lb 4.8 oz (84.5 kg), SpO2 95 %. Physical Exam  Constitutional: He appears well-developed.  HENT:  Head: Normocephalic.  Eyes: Pupils are equal, round, and reactive to light.  Neck: Normal range of motion.  Cardiovascular: Normal  rate.   Respiratory:  On oxygen denies acute dyspnea.  GI: Soft.  Skin: There is erythema.  Psychiatric: He has a normal mood and affect. His behavior is normal.     Assessment/Plan: Staph bacteremia with the right foot great toe necrotic ulceration on the medial plantar aspect superficial with improvement of the erythema from previous lines drawn the foot. Patient has some decreased sensation no specific severe tenderness with palpation of purulence expressed. Currently his mental condition is improved with antibiotic treatment. We'll continue to follow and if it does not continue to improve with dressing changes will proceed with some necrotic skin debridement. Currently there is no underlying pus underneath the area. My cell phone [phone].  Will follow with you. Thank you for allowing me to share in his care.   Bradley Bennett 10/21/2016, 8:22 PM

## 2016-10-21 NOTE — Progress Notes (Signed)
PROGRESS NOTE    Bradley Bennett  OKH:[id number] DOB: [birth year] DOA: 10/19/2016 PCP: [name], MD   Brief Narrative: Bradley Bennett is a 83 y.o. male with medical history significant of interstitial lung disease with significant chronic hypoxic respiratory failure who presents with right foot erythema and altered mentation. Patient was brought by his daughter after finding him to be confused. Apparently one week ago he had a respiratory arrest after running out of his supplemental oxygen, he was found down on a parking lot, required bag valve mask ventilation, brought to the hospital stabilized and discharged home. Since then patient has been disoriented, and confused, this symptoms have been persistent and progressive, no improving or worsening factors, no other associated symptoms. Today he was found by his daughter to be more confused, she noticed significant redness of his right foot and brought him to the hospital.  Unable to obtain further history from patient due to confusion, he reports having the foot redness for 7 days, been nonpainful. Most information was been obtained from his daughter at the bedside. Patient seen this AM and was extremely hard of hearing and still slightly confused. Blood Cx revealed that he had a MSSA Bacteremia so ID was consulted. [place] Nurse evaluated wound and recommended Ortho Consultation. Ortho Consulted and still awaiting to see the patient.   Assessment & Plan:   Active Problems:   Hyperlipidemia   COPD (chronic obstructive pulmonary disease) (HCC)   Postinflammatory pulmonary fibrosis (HCC)   Parkinson disease (HCC)   Hypothyroidism   Dyspnea   Prediabetes   Cellulitis   Sepsis ([place])   Bacteremia due to methicillin susceptible Staphylococcus aureus (MSSA)   Hyponatremia  Sepsis due to right foot cellulitis and MSSA Bacteremia.  -Admitted to SDU -Sepsis Physiology is improving -Will continue IV fluids with isotonic crystalloid solutions at  50 mL per hour -Broad-spectrum antibiotic therapy with IV vancomycin and IV Zosyn narrowed to ANCEF.  -Will keep mean arterial pressure above 65.  -Follow up WBC; was 5.4 -Temperature improved -Right Foot MRI showed Soft tissue ulceration along the medial aspect of the great toe at the level of the first distal phalanx. No evidence of osteomyelitis or drainable fluid collection -Blood Cx Obtained and showed Gram Positive Cocci in Clusters with Methicillin Sensitive Staph Aureus grown -CRP was 5.7 and ESR was 71 -ID Consulted for further evaluation and narrowed IV Abx to Ancef -WOC evaluated and Recommending Ortho for Debridement -Consulted Orthopedics Dr. Lorin Mercy still to see the patient.  -Blood Cx being repeated Today and are pending -ID recommends checking TTE (As below) and unsure whether patient would tolerate TEE due to Lung Disease; Will defer whether or not patient needs TEE to ID  Toxic Metabolic Encephalopathy, improved -Related to sepsis, continue neuro checks per unit protocol. -Improved and daughter feels like he is improved.   Acute on chronic hypoxic respiratory failure from Severe Pulmonary Fibrosis and ILD -Will continue oxygen supplementation, per nasal cannula, to target oxygen saturation greater than 88%.  -Chest x-ray showed bilateral interstitial infiltrates unchanged from prior films. -C/w Dulera and Incruse -ECHO done and showed Pulmonary arteries: Systolic pressure was moderately to severely increased. PA peak pressure: 56 mm Hg (S).  Parkinson's Disease.  -Continue Sinemet  -Physical therapy once patient more stable. Will consult in AM -Continue trazodone, buspirone, gabapentin.   Hypothyroid.  -Continue Levothyroxine 150 mcg po Daily  Hyponatremia, improved -Was Mild at 134 and improved to 136 -Continue to Monitor and repeat CMP in  AM   Pre-Diabetes -HbA1c was 6.0 -If Blood Sugars Consistently >200 will need SSI  Neuropathy -C/w Gabapentin 100 mg  po BID and 400 mg po qHS  HLD -C/w Atorvastatin 20 mg po qHS   DVT prophylaxis: Enoxaparin 40 mg sq q24h Code Status: Partial Code (No CPR) Family Communication: No Family present at bedside but discussed case over the phone with Daughter  Disposition Plan: Remain in SDU  Consultants:   Infectious Diseases  Orthopedic Surgery  WOC Nurse    Procedures:  ECHOCARDIOGRAM Study Conclusions  - Left ventricle: The cavity size was normal. There was mild   concentric hypertrophy. Systolic function was normal. The   estimated ejection fraction was in the range of 60% to 65%. Wall   motion was normal; there were no regional wall motion   abnormalities. Doppler parameters are consistent with abnormal   left ventricular relaxation (grade 1 diastolic dysfunction). - Aortic valve: Transvalvular velocity was within the normal range.   There was no stenosis. There was no regurgitation. - Mitral valve: Transvalvular velocity was within the normal range.   There was no evidence for stenosis. There was trivial   regurgitation. - Left atrium: The atrium was moderately dilated. - Right ventricle: The cavity size was normal. Wall thickness was   normal. Systolic function was normal. - Tricuspid valve: There was mild regurgitation. - Pulmonary arteries: Systolic pressure was moderately to severely   increased. PA peak pressure: 56 mm Hg (S).  Antimicrobials:  Anti-infectives    Start     Dose/Rate Route Frequency Ordered Stop   10/20/16 1500  ceFAZolin (ANCEF) IVPB 2g/100 mL premix     2 g 200 mL/hr over 30 Minutes Intravenous Every 8 hours 10/20/16 1422     10/19/16 2200  piperacillin-tazobactam (ZOSYN) IVPB 3.375 g  Status:  Discontinued     3.375 g 12.5 mL/hr over 240 Minutes Intravenous Every 8 hours 10/19/16 1620 10/20/16 1422   10/19/16 2200  vancomycin (VANCOCIN) IVPB 1000 mg/200 mL premix  Status:  Discontinued     1,000 mg 200 mL/hr over 60 Minutes Intravenous Every 12 hours  10/19/16 1620 10/20/16 1422   10/19/16 1445  piperacillin-tazobactam (ZOSYN) IVPB 3.375 g     3.375 g 100 mL/hr over 30 Minutes Intravenous  Once 10/19/16 1442 10/19/16 1556   10/19/16 1445  vancomycin (VANCOCIN) IVPB 1000 mg/200 mL premix     1,000 mg 200 mL/hr over 60 Minutes Intravenous  Once 10/19/16 1442 10/19/16 1615     Subjective: Seen and examined and was able to hear better with hearing aids in. No nausea or vomiting and felt better and alert and oriented. No CP or SOB. Bumped toe overnight hand had some bleeding. No other complaints or concerns at this time.    Objective: Vitals:   10/21/16 0400 10/21/16 0647 10/21/16 0825 10/21/16 0940  BP: 105/60  (!) 101/58   Pulse: 79  99   Resp: 17  (!) 27   Temp: 98.4 F (36.9 C)  97.9 F (36.6 C)   TempSrc: Oral  Oral   SpO2: 90%  92% 94%  Weight:  84.5 kg (186 lb 4.8 oz)      Intake/Output Summary (Last 24 hours) at 10/21/16 1822 Last data filed at 10/20/16 2339  Gross per 24 hour  Intake              220 ml  Output              700  ml  Net             -480 ml   Filed Weights   10/19/16 1443 10/20/16 0033 10/21/16 0647  Weight: 85.3 kg (188 lb) 81.7 kg (180 lb 1.9 oz) 84.5 kg (186 lb 4.8 oz)   Examination: Physical Exam:  Constitutional: WN/WD Caucasian male in NAD appears calm and awoken from sleep  Eyes: Sclerae anicteric; Lids normal ENMT: Mucous Membranes Moist. Hard of hearing but better with Hearing aides. External Ears and nose appear normal Neck: Supple with no appreciable JVD Respiratory: Diminished to auscultation with some rales. No wheezing/crackles. No accessory muscle but wearing supplemental O2 Cardiovascular: RRR; No m/r/g.  Abdomen: Soft, NT, ND. Bowel sounds present GU: Deferred Musculoskeletal: No cyanosis. No contractures Skin: Warm and Dry; Has Right Toe with Necrotic ulceration with less erythema and edema but has some bleeding Neurologic: CN 2-12 grossly intact. Has resting tremor pronounced  on Left Arm Psychiatric: Pleasant mood and affect. Intact judgement and insight  Data Reviewed: I have personally reviewed following labs and imaging studies  CBC:  Recent Labs Lab 10/19/16 1425 10/19/16 2215 10/20/16 1419 10/21/16 0639  WBC 7.3 6.5 5.6 5.4  NEUTROABS 6.0 5.2 3.9 3.3  HGB 14.3 13.2 12.0* 12.0*  HCT 44.8 41.2 36.4* 38.2*  MCV 89.6 89.6 89.9 89.9  PLT 284 250 254 782   Basic Metabolic Panel:  Recent Labs Lab 10/19/16 1425 10/19/16 2215 10/19/16 2342 10/20/16 1419 10/21/16 0639  NA 135 135 134* 134* 136  K 4.3 3.9 4.0 3.5 3.7  CL 100* 103 102 102 103  CO2 '25 23 25 27 27  ' GLUCOSE 99 97 111* 126* 88  BUN '16 15 14 11 7  ' CREATININE 0.96 1.03 0.87 0.89 0.72  CALCIUM 8.9 8.3* 8.3* 8.2* 8.3*  MG  --   --   --  1.7 1.8  PHOS  --   --   --  2.6 2.8   GFR: Estimated Creatinine Clearance: 74.7 mL/min (by C-G formula based on SCr of 0.72 mg/dL). Liver Function Tests:  Recent Labs Lab 10/19/16 1425 10/19/16 2342 10/20/16 1419 10/21/16 0639  AST '21 22 27 26  ' ALT 6* 16* 7* 7*  ALKPHOS 94 83 75 78  BILITOT 0.7 0.9 0.8 0.6  PROT 7.2 6.5 5.6* 6.1*  ALBUMIN 3.4* 2.7* 2.4* 2.7*   No results for input(s): LIPASE, AMYLASE in the last 168 hours. No results for input(s): AMMONIA in the last 168 hours. Coagulation Profile:  Recent Labs Lab 10/19/16 1425  INR 0.99   Cardiac Enzymes: No results for input(s): CKTOTAL, CKMB, CKMBINDEX, TROPONINI in the last 168 hours. BNP (last 3 results) No results for input(s): PROBNP in the last 8760 hours. HbA1C:  Recent Labs  10/19/16 2342  HGBA1C 6.0*   CBG: No results for input(s): GLUCAP in the last 168 hours. Lipid Profile: No results for input(s): CHOL, HDL, LDLCALC, TRIG, CHOLHDL, LDLDIRECT in the last 72 hours. Thyroid Function Tests: No results for input(s): TSH, T4TOTAL, FREET4, T3FREE, THYROIDAB in the last 72 hours. Anemia Panel: No results for input(s): VITAMINB12, FOLATE, FERRITIN, TIBC, IRON,  RETICCTPCT in the last 72 hours. Sepsis Labs:  Recent Labs Lab 10/19/16 1458 10/19/16 1832  LATICACIDVEN 1.81 0.68    Recent Results (from the past 240 hour(s))  Culture, blood (Routine x 2)     Status: Abnormal (Preliminary result)   Collection Time: 10/19/16  2:30 PM  Result Value Ref Range Status   Specimen Description BLOOD LEFT  ARM  Final   Special Requests   Final    BOTTLES DRAWN AEROBIC AND ANAEROBIC Blood Culture adequate volume   Culture  Setup Time   Final    GRAM POSITIVE COCCI IN CLUSTERS IN BOTH AEROBIC AND ANAEROBIC BOTTLES CRITICAL RESULT CALLED TO, READ BACK BY AND VERIFIED WITH: PHARMD A JOHNSTON [id number] [phone] MLM    Culture (A)  Final    STAPHYLOCOCCUS AUREUS SUSCEPTIBILITIES TO FOLLOW    Report Status PENDING  Incomplete  Blood Culture ID Panel (Reflexed)     Status: Abnormal   Collection Time: 10/19/16  2:30 PM  Result Value Ref Range Status   Enterococcus species NOT DETECTED NOT DETECTED Final   Vancomycin resistance NOT DETECTED NOT DETECTED Final   Listeria monocytogenes NOT DETECTED NOT DETECTED Final   Staphylococcus species DETECTED (A) NOT DETECTED Final    Comment: CRITICAL RESULT CALLED TO, READ BACK BY AND VERIFIED WITH: PHARMD A JOHNSTON [id number] [phone] MLM    Staphylococcus aureus DETECTED (A) NOT DETECTED Final    Comment: CRITICAL RESULT CALLED TO, READ BACK BY AND VERIFIED WITH: PHARMD A JOHNSTON [id number] [phone] MLM    Methicillin resistance NOT DETECTED NOT DETECTED Final   Streptococcus species NOT DETECTED NOT DETECTED Final   Streptococcus agalactiae NOT DETECTED NOT DETECTED Final   Streptococcus pneumoniae NOT DETECTED NOT DETECTED Final   Streptococcus pyogenes NOT DETECTED NOT DETECTED Final   Acinetobacter baumannii NOT DETECTED NOT DETECTED Final   Enterobacteriaceae species NOT DETECTED NOT DETECTED Final   Enterobacter cloacae complex NOT DETECTED NOT DETECTED Final   Escherichia coli NOT DETECTED NOT DETECTED Final    Klebsiella oxytoca NOT DETECTED NOT DETECTED Final   Klebsiella pneumoniae NOT DETECTED NOT DETECTED Final   Proteus species NOT DETECTED NOT DETECTED Final   Serratia marcescens NOT DETECTED NOT DETECTED Final   Carbapenem resistance NOT DETECTED NOT DETECTED Final   Haemophilus influenzae NOT DETECTED NOT DETECTED Final   Neisseria meningitidis NOT DETECTED NOT DETECTED Final   Pseudomonas aeruginosa NOT DETECTED NOT DETECTED Final   Candida albicans NOT DETECTED NOT DETECTED Final   Candida glabrata NOT DETECTED NOT DETECTED Final   Candida krusei NOT DETECTED NOT DETECTED Final   Candida parapsilosis NOT DETECTED NOT DETECTED Final   Candida tropicalis NOT DETECTED NOT DETECTED Final  Culture, blood (Routine x 2)     Status: Abnormal (Preliminary result)   Collection Time: 10/19/16  2:41 PM  Result Value Ref Range Status   Specimen Description BLOOD RIGHT ARM  Final   Special Requests   Final    BOTTLES DRAWN AEROBIC AND ANAEROBIC Blood Culture adequate volume   Culture  Setup Time   Final    GRAM POSITIVE COCCI IN CLUSTERS IN BOTH AEROBIC AND ANAEROBIC BOTTLES CRITICAL VALUE NOTED.  VALUE IS CONSISTENT WITH PREVIOUSLY REPORTED AND CALLED VALUE.    Culture STAPHYLOCOCCUS AUREUS (A)  Final   Report Status PENDING  Incomplete  MRSA PCR Screening     Status: None   Collection Time: 10/20/16 12:11 AM  Result Value Ref Range Status   MRSA by PCR NEGATIVE NEGATIVE Final    Comment:        The GeneXpert MRSA Assay (FDA approved for NASAL specimens only), is one component of a comprehensive MRSA colonization surveillance program. It is not intended to diagnose MRSA infection nor to guide or monitor treatment for MRSA infections.     Radiology Studies: Mr Foot Right W Wo Contrast  Result Date: 10/20/2016  CLINICAL DATA:  Left great toe ulcer.  Evaluate for osteomyelitis. EXAM: MRI OF THE RIGHT FOREFOOT WITHOUT AND WITH CONTRAST TECHNIQUE: Multiplanar, multisequence MR imaging  of the right forefoot was performed before and after the administration of intravenous contrast. CONTRAST:  73m MULTIHANCE GADOBENATE DIMEGLUMINE 529 MG/ML IV SOLN COMPARISON:  Right foot x-rays from yesterday. FINDINGS: Bones/Joint/Cartilage No suspicious marrow signal abnormality. Mild degenerative changes of the first MTP and IP joints. No fracture or dislocation. Ligaments The Lisfranc ligament is intact. The collateral ligaments are intact. Muscles and Tendons Severe fatty atrophy of the intrinsic muscles of the forefoot. The visualized flexor and extensor tendons are intact. Soft tissues Soft tissue ulceration along the medial aspect of the first distal phalanx. Mild surrounding soft tissue enhancement. No drainable fluid collection IMPRESSION: 1. Soft tissue ulceration along the medial aspect of the great toe at the level of the first distal phalanx. No evidence of osteomyelitis or drainable fluid collection. Electronically Signed   By: WTitus DubinM.D.   On: 10/20/2016 12:21   Scheduled Meds: . alfuzosin  10 mg Oral Daily  . atorvastatin  20 mg Oral Daily  . busPIRone  5 mg Oral BID  . carbidopa-levodopa  1 tablet Oral QHS  . carbidopa-levodopa  2 tablet Oral TID WC  . clopidogrel  75 mg Oral Daily  . enoxaparin (LOVENOX) injection  40 mg Subcutaneous Q24H  . finasteride  5 mg Oral QPM  . fluticasone  1 spray Each Nare Daily  . gabapentin  100 mg Oral BID   And  . gabapentin  400 mg Oral QHS  . lactose free nutrition  237 mL Oral TID WC  . levothyroxine  150 mcg Oral QAC breakfast  . mirabegron ER  50 mg Oral QPM  . mometasone-formoterol  2 puff Inhalation BID  . multivitamin  1 tablet Oral Daily  . multivitamin with minerals  1 tablet Oral Q1200  . mupirocin cream   Topical Daily  . mupirocin ointment  1 application Nasal BID  . sodium chloride flush  3 mL Intravenous Q12H  . sodium chloride flush  3 mL Intravenous Q12H  . traZODone  100 mg Oral QHS  . umeclidinium bromide  1  puff Inhalation Daily   Continuous Infusions: . sodium chloride    . sodium chloride 100 mL/hr at 10/21/16 0622  .  ceFAZolin (ANCEF) IV Stopped (10/21/16 1826)    LOS: 2 days   OKerney Elbe DO [facility] Pager [phone] If 7PM-7AM, please contact night-coverage [email or web address] Password [facility]9/15/2018, 6:22 PM

## 2016-10-21 NOTE — Progress Notes (Signed)
Patient O 2 sats 85-87% on 6 liters of HIgh Flow  Oxygen. After inhalers given. Patient voiced no complaints. Patient states he wears O 2 10 liters at home and 15 liters with activity. Inc. O2 to 10 Liters sat 88-93% R.N. Aware and X. Blount N.P. Aware and o.k. For O2 to be at 10 liters.

## 2016-10-21 NOTE — Progress Notes (Signed)
Bradley N.T. And Bradley N.T. Gave patient a bath. Dressing was off right foot. They got patient up to B.S.C. and then to chair. Patient Bump Rt great toe on something and Blister was open and bleeding a small amount . Clean wound with N.S. And applied dressing. And slippers on . M.D. please look at it and order dressing changes as needed.

## 2016-10-22 ENCOUNTER — Inpatient Hospital Stay (HOSPITAL_COMMUNITY): Payer: Medicare Other

## 2016-10-22 DIAGNOSIS — L0390 Cellulitis, unspecified: Secondary | ICD-10-CM

## 2016-10-22 LAB — CBC WITH DIFFERENTIAL/PLATELET
BASOS ABS: 0 10*3/uL (ref 0.0–0.1)
Basophils Relative: 0 %
EOS PCT: 6 %
Eosinophils Absolute: 0.3 10*3/uL (ref 0.0–0.7)
HCT: 35.7 % — ABNORMAL LOW (ref 39.0–52.0)
Hemoglobin: 11.3 g/dL — ABNORMAL LOW (ref 13.0–17.0)
Lymphocytes Relative: 20 %
Lymphs Abs: 1.1 10*3/uL (ref 0.7–4.0)
MCH: 28.1 pg (ref 26.0–34.0)
MCHC: 31.7 g/dL (ref 30.0–36.0)
MCV: 88.8 fL (ref 78.0–100.0)
Monocytes Absolute: 0.7 10*3/uL (ref 0.1–1.0)
Monocytes Relative: 12 %
NEUTROS ABS: 3.5 10*3/uL (ref 1.7–7.7)
Neutrophils Relative %: 62 %
PLATELETS: 236 10*3/uL (ref 150–400)
RBC: 4.02 MIL/uL — AB (ref 4.22–5.81)
RDW: 16.8 % — ABNORMAL HIGH (ref 11.5–15.5)
WBC: 5.7 10*3/uL (ref 4.0–10.5)

## 2016-10-22 LAB — CULTURE, BLOOD (ROUTINE X 2)
SPECIAL REQUESTS: ADEQUATE
Special Requests: ADEQUATE

## 2016-10-22 LAB — COMPREHENSIVE METABOLIC PANEL
AST: 23 U/L (ref 15–41)
Albumin: 2.3 g/dL — ABNORMAL LOW (ref 3.5–5.0)
Alkaline Phosphatase: 72 U/L (ref 38–126)
Anion gap: 5 (ref 5–15)
BUN: 7 mg/dL (ref 6–20)
CHLORIDE: 102 mmol/L (ref 101–111)
CO2: 29 mmol/L (ref 22–32)
CREATININE: 0.61 mg/dL (ref 0.61–1.24)
Calcium: 8.1 mg/dL — ABNORMAL LOW (ref 8.9–10.3)
GFR calc Af Amer: >60 mL/min (ref >60)
Glucose, Bld: 92 mg/dL (ref 65–99)
Potassium: 3.5 mmol/L (ref 3.5–5.1)
Sodium: 136 mmol/L (ref 135–145)
Total Bilirubin: 0.7 mg/dL (ref 0.3–1.2)
Total Protein: 5.4 g/dL — ABNORMAL LOW (ref 6.5–8.1)

## 2016-10-22 LAB — PHOSPHORUS: Phosphorus: 2.5 mg/dL (ref 2.5–4.6)

## 2016-10-22 LAB — MAGNESIUM: MAGNESIUM: 1.7 mg/dL (ref 1.7–2.4)

## 2016-10-22 MED ORDER — POLYETHYLENE GLYCOL 3350 17 G PO PACK
17.0000 g | PACK | Freq: Once | ORAL | Status: AC
  Administered 2016-10-23: 17 g via ORAL
  Filled 2016-10-22: qty 1

## 2016-10-22 NOTE — Progress Notes (Signed)
VASCULAR LAB PRELIMINARY  ARTERIAL  ABI completed:ABIs and waveforms are within normal waveforms.    RIGHT    LEFT    PRESSURE WAVEFORM  PRESSURE WAVEFORM  BRACHIAL 110 T BRACHIAL 113 T  DP   DP    AT 122 T AT 128 T  PT 116 T PT 144 T  PER   PER    GREAT TOE  NA GREAT TOE  NA    RIGHT LEFT  ABI 1.08 1.2     Allaya Abbasi, RVT 10/22/2016, 11:08 AM

## 2016-10-22 NOTE — Progress Notes (Signed)
PROGRESS NOTE    Bradley Bennett  ZSW:[id number] DOB: Apr 08, 1932 DOA: 10/19/2016 PCP: [name], MD   Brief Narrative: Bradley Bennett is a 83 y.o. male with medical history significant of interstitial lung disease with significant chronic hypoxic respiratory failure who presents with right foot erythema and altered mentation. Patient was brought by his daughter after finding him to be confused. Apparently one week ago he had a respiratory arrest after running out of his supplemental oxygen, he was found down on a parking lot, required bag valve mask ventilation, brought to the hospital stabilized and discharged home. Since then patient has been disoriented, and confused, this symptoms have been persistent and progressive, no improving or worsening factors, no other associated symptoms. Today he was found by his daughter to be more confused, she noticed significant redness of his right foot and brought him to the hospital.  Unable to obtain further history from patient due to confusion, he reports having the foot redness for 7 days, been nonpainful. Most information was been obtained from his daughter at the bedside. Patient seen this AM and was extremely hard of hearing and still slightly confused. Blood Cx revealed that he had a MSSA Bacteremia so ID was consulted. [place] Nurse evaluated wound and recommended Ortho Consultation. Ortho Consulted and will continue to monitor and if does not improve will proceed with necrotic skin debridement.   Assessment & Plan:   Active Problems:   Hyperlipidemia   COPD (chronic obstructive pulmonary disease) (HCC)   Postinflammatory pulmonary fibrosis (HCC)   Parkinson disease (HCC)   Hypothyroidism   Dyspnea   Prediabetes   Cellulitis   Sepsis ([place])   Bacteremia due to methicillin susceptible Staphylococcus aureus (MSSA)   Hyponatremia  Sepsis due to right foot cellulitis and MSSA Bacteremia.  -Admitted to SDU -Sepsis Physiology is  improving -Will D/C IV fluids with isotonic crystalloid solutions at 50 mL per hour -Broad-spectrum antibiotic therapy with IV vancomycin and IV Zosyn narrowed to Ancef. C/w Cefazolin 2 grams q8h -Will keep mean arterial pressure above 65.  -Follow up WBC; was 5.7 -Temperature improved -Right Foot MRI showed Soft tissue ulceration along the medial aspect of the great toe at the level of the first distal phalanx. No evidence of osteomyelitis or drainable fluid collection -Blood Cx Obtained and showed Gram Positive Cocci in Clusters with Methicillin Sensitive Staph Aureus grown -CRP was 5.7 and ESR was 71 -ID Consulted for further evaluation and narrowed IV Abx to Ancef -WOC evaluated and Recommending Ortho for Debridement -Consulted Orthopedics Dr. Lorin Mercy and appreciate him evaluating. Dr. Lorin Mercy recommends continuing IV Abx and will continue to follow and if it does not continue to improve with dressing changes will proceed with some necrotic skin debridement -Blood Cx 10/21/16 repeated and showed NGTD at 1 day -ID recommends checking TTE (As below) and unsure whether patient would tolerate TEE due to Lung Disease; Will defer whether or not patient needs TEE to ID; If needs TEE will consult Cardiology -ABIs done and showed waveforms are within normal waveforms  Toxic Metabolic Encephalopathy, improved -Related to sepsis, continue neuro checks per unit protocol. -Improved and daughter feels like he is improved.   Acute on chronic hypoxic respiratory failure from Severe Pulmonary Fibrosis and ILD -Will continue oxygen supplementation, per nasal cannula, to target oxygen saturation greater than 88%.  -Chest x-ray showed bilateral interstitial infiltrates unchanged from prior films. -C/w Dulera and Incruse -ECHO done and showed Pulmonary arteries: Systolic pressure was moderately to severely  increased. PA peak pressure: 56 mm Hg (S).  Parkinson's Disease.  -Continue Sinemet 25-100 mg 2 tab po  TIDwm and 50-200 mg Tab po qHS -Physical therapy once patient more stable.  -Continue trazodone, buspirone, gabapentin.  -PT/OT Eval and Treat  Hypothyroid.  -Continue Levothyroxine 150 mcg po Daily  Hyponatremia, improved -Was Mild at 134 and improved to 136 -Continue to Monitor and repeat CMP in AM   Pre-Diabetes -HbA1c was 6.0 -If Blood Sugars Consistently >200 will need SSI -Blood Sugars on BMP have ranged from 92-126  Neuropathy -C/w Gabapentin 100 mg po BID and 400 mg po qHS  HLD -C/w Atorvastatin 20 mg po qHS   DVT prophylaxis: Enoxaparin 40 mg sq q24h Code Status: Partial Code (No CPR) Family Communication: No family present at bedside Disposition Plan: Transfer to Telemetry   Consultants:   Infectious Diseases  Orthopedic Surgery  WOC Nurse    Procedures:  ECHOCARDIOGRAM Study Conclusions  - Left ventricle: The cavity size was normal. There was mild   concentric hypertrophy. Systolic function was normal. The   estimated ejection fraction was in the range of 60% to 65%. Wall   motion was normal; there were no regional wall motion   abnormalities. Doppler parameters are consistent with abnormal   left ventricular relaxation (grade 1 diastolic dysfunction). - Aortic valve: Transvalvular velocity was within the normal range.   There was no stenosis. There was no regurgitation. - Mitral valve: Transvalvular velocity was within the normal range.   There was no evidence for stenosis. There was trivial   regurgitation. - Left atrium: The atrium was moderately dilated. - Right ventricle: The cavity size was normal. Wall thickness was   normal. Systolic function was normal. - Tricuspid valve: There was mild regurgitation. - Pulmonary arteries: Systolic pressure was moderately to severely   increased. PA peak pressure: 56 mm Hg (S).  ABI ABIs and waveforms are within normal waveforms. Right was 1.08 and Left was 1.2  Antimicrobials:  Anti-infectives     Start     Dose/Rate Route Frequency Ordered Stop   10/20/16 1500  ceFAZolin (ANCEF) IVPB 2g/100 mL premix     2 g 200 mL/hr over 30 Minutes Intravenous Every 8 hours 10/20/16 1422     10/19/16 2200  piperacillin-tazobactam (ZOSYN) IVPB 3.375 g  Status:  Discontinued     3.375 g 12.5 mL/hr over 240 Minutes Intravenous Every 8 hours 10/19/16 1620 10/20/16 1422   10/19/16 2200  vancomycin (VANCOCIN) IVPB 1000 mg/200 mL premix  Status:  Discontinued     1,000 mg 200 mL/hr over 60 Minutes Intravenous Every 12 hours 10/19/16 1620 10/20/16 1422   10/19/16 1445  piperacillin-tazobactam (ZOSYN) IVPB 3.375 g     3.375 g 100 mL/hr over 30 Minutes Intravenous  Once 10/19/16 1442 10/19/16 1556   10/19/16 1445  vancomycin (VANCOCIN) IVPB 1000 mg/200 mL premix     1,000 mg 200 mL/hr over 60 Minutes Intravenous  Once 10/19/16 1442 10/19/16 1615     Subjective: Seen and examined and felt ok. No nausea or vomiting. Was able to hear better. States he had no issues.   Objective: Vitals:   10/22/16 0900 10/22/16 0933 10/22/16 1000 10/22/16 1100  BP:      Pulse: 70  80 74  Resp: 18  (!) 22 (!) 23  Temp:      TempSrc:      SpO2: 98% 94% 90% (!) 88%  Weight:        Intake/Output  Summary (Last 24 hours) at 10/22/16 1651 Last data filed at 10/22/16 1300  Gross per 24 hour  Intake             1050 ml  Output             1050 ml  Net                0 ml   Filed Weights   10/20/16 0033 10/21/16 0647 10/22/16 0400  Weight: 81.7 kg (180 lb 1.9 oz) 84.5 kg (186 lb 4.8 oz) 84 kg (185 lb 3 oz)   Examination: Physical Exam:  Constitutional: Pleasant Caucasian male in NAD.  Eyes: Sclerae Anicteric. Lids normal ENMT: MMM. Hard of Hearing. External Ears and nose appear normal Neck: Supple with no appreciable JVD Respiratory: Diminished to ausculation with some rales. No wheezing/crackles. No accessory muscle. Cardiovascular: RRR; No m/r/g Abdomen: Soft, NT, ND. Bowel sounds present GU:  Deferred Musculoskeletal: No cyanosis. No contractures Skin: Warm and Dry. Has Right Toe with Necrotic ulceration. Minimal edema and no appreciable edema Neurologic: CN 2-12 grossly intact. Has tremor. Hard of hearing Psychiatric: Pleasant mood and affect. Intact judgement and inisght. Awake and alert  Data Reviewed: I have personally reviewed following labs and imaging studies  CBC:  Recent Labs Lab 10/19/16 1425 10/19/16 2215 10/20/16 1419 10/21/16 0639 10/22/16 0228  WBC 7.3 6.5 5.6 5.4 5.7  NEUTROABS 6.0 5.2 3.9 3.3 3.5  HGB 14.3 13.2 12.0* 12.0* 11.3*  HCT 44.8 41.2 36.4* 38.2* 35.7*  MCV 89.6 89.6 89.9 89.9 88.8  PLT 284 250 254 249 809   Basic Metabolic Panel:  Recent Labs Lab 10/19/16 2215 10/19/16 2342 10/20/16 1419 10/21/16 0639 10/22/16 0228  NA 135 134* 134* 136 136  K 3.9 4.0 3.5 3.7 3.5  CL 103 102 102 103 102  CO2 '23 25 27 27 29  ' GLUCOSE 97 111* 126* 88 92  BUN '15 14 11 7 7  ' CREATININE 1.03 0.87 0.89 0.72 0.61  CALCIUM 8.3* 8.3* 8.2* 8.3* 8.1*  MG  --   --  1.7 1.8 1.7  PHOS  --   --  2.6 2.8 2.5   GFR: Estimated Creatinine Clearance: 74.7 mL/min (by C-G formula based on SCr of 0.61 mg/dL). Liver Function Tests:  Recent Labs Lab 10/19/16 1425 10/19/16 2342 10/20/16 1419 10/21/16 0639 10/22/16 0228  AST '21 22 27 26 23  ' ALT 6* 16* 7* 7* <5*  ALKPHOS 94 83 75 78 72  BILITOT 0.7 0.9 0.8 0.6 0.7  PROT 7.2 6.5 5.6* 6.1* 5.4*  ALBUMIN 3.4* 2.7* 2.4* 2.7* 2.3*   No results for input(s): LIPASE, AMYLASE in the last 168 hours. No results for input(s): AMMONIA in the last 168 hours. Coagulation Profile:  Recent Labs Lab 10/19/16 1425  INR 0.99   Cardiac Enzymes: No results for input(s): CKTOTAL, CKMB, CKMBINDEX, TROPONINI in the last 168 hours. BNP (last 3 results) No results for input(s): PROBNP in the last 8760 hours. HbA1C:  Recent Labs  10/19/16 2342  HGBA1C 6.0*   CBG: No results for input(s): GLUCAP in the last 168  hours. Lipid Profile: No results for input(s): CHOL, HDL, LDLCALC, TRIG, CHOLHDL, LDLDIRECT in the last 72 hours. Thyroid Function Tests: No results for input(s): TSH, T4TOTAL, FREET4, T3FREE, THYROIDAB in the last 72 hours. Anemia Panel: No results for input(s): VITAMINB12, FOLATE, FERRITIN, TIBC, IRON, RETICCTPCT in the last 72 hours. Sepsis Labs:  Recent Labs Lab 10/19/16 1458 10/19/16 1832  LATICACIDVEN  1.81 0.68    Recent Results (from the past 240 hour(s))  Culture, blood (Routine x 2)     Status: Abnormal   Collection Time: 10/19/16  2:30 PM  Result Value Ref Range Status   Specimen Description BLOOD LEFT ARM  Final   Special Requests   Final    BOTTLES DRAWN AEROBIC AND ANAEROBIC Blood Culture adequate volume   Culture  Setup Time   Final    GRAM POSITIVE COCCI IN CLUSTERS IN BOTH AEROBIC AND ANAEROBIC BOTTLES CRITICAL RESULT CALLED TO, READ BACK BY AND VERIFIED WITH: PHARMD A JOHNSTON [id number] MLM    Culture STAPHYLOCOCCUS AUREUS (A)  Final   Report Status 10/22/2016 FINAL  Final   Organism ID, Bacteria STAPHYLOCOCCUS AUREUS  Final      Susceptibility   Staphylococcus aureus - MIC*    CIPROFLOXACIN <=0.5 SENSITIVE Sensitive     ERYTHROMYCIN <=0.25 SENSITIVE Sensitive     GENTAMICIN <=0.5 SENSITIVE Sensitive     OXACILLIN 0.5 SENSITIVE Sensitive     TETRACYCLINE <=1 SENSITIVE Sensitive     VANCOMYCIN <=0.5 SENSITIVE Sensitive     TRIMETH/SULFA <=10 SENSITIVE Sensitive     CLINDAMYCIN <=0.25 SENSITIVE Sensitive     RIFAMPIN <=0.5 SENSITIVE Sensitive     Inducible Clindamycin NEGATIVE Sensitive     * STAPHYLOCOCCUS AUREUS  Blood Culture ID Panel (Reflexed)     Status: Abnormal   Collection Time: 10/19/16  2:30 PM  Result Value Ref Range Status   Enterococcus species NOT DETECTED NOT DETECTED Final   Vancomycin resistance NOT DETECTED NOT DETECTED Final   Listeria monocytogenes NOT DETECTED NOT DETECTED Final   Staphylococcus species DETECTED (A) NOT  DETECTED Final    Comment: CRITICAL RESULT CALLED TO, READ BACK BY AND VERIFIED WITH: PHARMD A JOHNSTON [id number] [phone] MLM    Staphylococcus aureus DETECTED (A) NOT DETECTED Final    Comment: CRITICAL RESULT CALLED TO, READ BACK BY AND VERIFIED WITH: PHARMD A JOHNSTON [id number] [phone] MLM    Methicillin resistance NOT DETECTED NOT DETECTED Final   Streptococcus species NOT DETECTED NOT DETECTED Final   Streptococcus agalactiae NOT DETECTED NOT DETECTED Final   Streptococcus pneumoniae NOT DETECTED NOT DETECTED Final   Streptococcus pyogenes NOT DETECTED NOT DETECTED Final   Acinetobacter baumannii NOT DETECTED NOT DETECTED Final   Enterobacteriaceae species NOT DETECTED NOT DETECTED Final   Enterobacter cloacae complex NOT DETECTED NOT DETECTED Final   Escherichia coli NOT DETECTED NOT DETECTED Final   Klebsiella oxytoca NOT DETECTED NOT DETECTED Final   Klebsiella pneumoniae NOT DETECTED NOT DETECTED Final   Proteus species NOT DETECTED NOT DETECTED Final   Serratia marcescens NOT DETECTED NOT DETECTED Final   Carbapenem resistance NOT DETECTED NOT DETECTED Final   Haemophilus influenzae NOT DETECTED NOT DETECTED Final   Neisseria meningitidis NOT DETECTED NOT DETECTED Final   Pseudomonas aeruginosa NOT DETECTED NOT DETECTED Final   Candida albicans NOT DETECTED NOT DETECTED Final   Candida glabrata NOT DETECTED NOT DETECTED Final   Candida krusei NOT DETECTED NOT DETECTED Final   Candida parapsilosis NOT DETECTED NOT DETECTED Final   Candida tropicalis NOT DETECTED NOT DETECTED Final  Culture, blood (Routine x 2)     Status: Abnormal   Collection Time: 10/19/16  2:41 PM  Result Value Ref Range Status   Specimen Description BLOOD RIGHT ARM  Final   Special Requests   Final    BOTTLES DRAWN AEROBIC AND ANAEROBIC Blood Culture adequate volume   Culture  Setup Time   Final    GRAM POSITIVE COCCI IN CLUSTERS IN BOTH AEROBIC AND ANAEROBIC BOTTLES CRITICAL VALUE NOTED.  VALUE IS CONSISTENT  WITH PREVIOUSLY REPORTED AND CALLED VALUE.    Culture (A)  Final    STAPHYLOCOCCUS AUREUS SUSCEPTIBILITIES PERFORMED ON PREVIOUS CULTURE WITHIN THE LAST 5 DAYS.    Report Status 10/22/2016 FINAL  Final  MRSA PCR Screening     Status: None   Collection Time: 10/20/16 12:11 AM  Result Value Ref Range Status   MRSA by PCR NEGATIVE NEGATIVE Final    Comment:        The GeneXpert MRSA Assay (FDA approved for NASAL specimens only), is one component of a comprehensive MRSA colonization surveillance program. It is not intended to diagnose MRSA infection nor to guide or monitor treatment for MRSA infections.   Culture, blood (routine x 2)     Status: None (Preliminary result)   Collection Time: 10/21/16  6:45 AM  Result Value Ref Range Status   Specimen Description BLOOD RIGHT ANTECUBITAL  Final   Special Requests   Final    BOTTLES DRAWN AEROBIC AND ANAEROBIC Blood Culture adequate volume   Culture NO GROWTH 1 DAY  Final   Report Status PENDING  Incomplete  Culture, blood (routine x 2)     Status: None (Preliminary result)   Collection Time: 10/21/16  6:50 AM  Result Value Ref Range Status   Specimen Description BLOOD RIGHT HAND  Final   Special Requests IN PEDIATRIC BOTTLE Blood Culture adequate volume  Final   Culture NO GROWTH 1 DAY  Final   Report Status PENDING  Incomplete    Radiology Studies: No results found. Scheduled Meds: . alfuzosin  10 mg Oral Daily  . atorvastatin  20 mg Oral Daily  . busPIRone  5 mg Oral BID  . carbidopa-levodopa  1 tablet Oral QHS  . carbidopa-levodopa  2 tablet Oral TID WC  . clopidogrel  75 mg Oral Daily  . enoxaparin (LOVENOX) injection  40 mg Subcutaneous Q24H  . finasteride  5 mg Oral QPM  . fluticasone  1 spray Each Nare Daily  . gabapentin  100 mg Oral BID   And  . gabapentin  400 mg Oral QHS  . lactose free nutrition  237 mL Oral TID WC  . levothyroxine  150 mcg Oral QAC breakfast  . mirabegron ER  50 mg Oral QPM  .  mometasone-formoterol  2 puff Inhalation BID  . multivitamin  1 tablet Oral Daily  . multivitamin with minerals  1 tablet Oral Q1200  . mupirocin cream   Topical Daily  . mupirocin ointment  1 application Nasal BID  . sodium chloride flush  3 mL Intravenous Q12H  . sodium chloride flush  3 mL Intravenous Q12H  . traZODone  100 mg Oral QHS  . umeclidinium bromide  1 puff Inhalation Daily   Continuous Infusions: . sodium chloride    . sodium chloride 50 mL/hr at 10/22/16 1534  .  ceFAZolin (ANCEF) IV 2 g (10/22/16 1631)    LOS: 3 days   Kerney Elbe, DO [facility] Pager [phone]  If 7PM-7AM, please contact night-coverage [email or web address] Password [facility] 10/22/2016, 4:51 PM

## 2016-10-23 DIAGNOSIS — G629 Polyneuropathy, unspecified: Secondary | ICD-10-CM

## 2016-10-23 DIAGNOSIS — R7881 Bacteremia: Secondary | ICD-10-CM

## 2016-10-23 DIAGNOSIS — L0390 Cellulitis, unspecified: Secondary | ICD-10-CM

## 2016-10-23 LAB — CBC WITH DIFFERENTIAL/PLATELET
BASOS PCT: 0 %
Basophils Absolute: 0 10*3/uL (ref 0.0–0.1)
Eosinophils Absolute: 0.4 10*3/uL (ref 0.0–0.7)
Eosinophils Relative: 6 %
HEMATOCRIT: 35.7 % — AB (ref 39.0–52.0)
Hemoglobin: 11.3 g/dL — ABNORMAL LOW (ref 13.0–17.0)
LYMPHS ABS: 1.1 10*3/uL (ref 0.7–4.0)
Lymphocytes Relative: 17 %
MCH: 28 pg (ref 26.0–34.0)
MCHC: 31.7 g/dL (ref 30.0–36.0)
MCV: 88.4 fL (ref 78.0–100.0)
MONO ABS: 0.9 10*3/uL (ref 0.1–1.0)
MONOS PCT: 14 %
NEUTROS ABS: 4.1 10*3/uL (ref 1.7–7.7)
NEUTROS PCT: 63 %
PLATELETS: 271 10*3/uL (ref 150–400)
RBC: 4.04 MIL/uL — ABNORMAL LOW (ref 4.22–5.81)
RDW: 16.5 % — ABNORMAL HIGH (ref 11.5–15.5)
WBC: 6.6 10*3/uL (ref 4.0–10.5)

## 2016-10-23 LAB — COMPREHENSIVE METABOLIC PANEL
ANION GAP: 6 (ref 5–15)
AST: 23 U/L (ref 15–41)
Albumin: 2.4 g/dL — ABNORMAL LOW (ref 3.5–5.0)
Alkaline Phosphatase: 73 U/L (ref 38–126)
BUN: 7 mg/dL (ref 6–20)
CHLORIDE: 101 mmol/L (ref 101–111)
CO2: 29 mmol/L (ref 22–32)
CREATININE: 0.65 mg/dL (ref 0.61–1.24)
Calcium: 8.3 mg/dL — ABNORMAL LOW (ref 8.9–10.3)
Glucose, Bld: 91 mg/dL (ref 65–99)
Potassium: 3.7 mmol/L (ref 3.5–5.1)
Sodium: 136 mmol/L (ref 135–145)
Total Bilirubin: 0.4 mg/dL (ref 0.3–1.2)
Total Protein: 5.6 g/dL — ABNORMAL LOW (ref 6.5–8.1)

## 2016-10-23 LAB — MAGNESIUM: MAGNESIUM: 1.7 mg/dL (ref 1.7–2.4)

## 2016-10-23 LAB — PHOSPHORUS: PHOSPHORUS: 2.9 mg/dL (ref 2.5–4.6)

## 2016-10-23 NOTE — Care Management Important Message (Signed)
Important Message  Patient Details  Name: Bradley Bennett MRN: [id number] Date of Birth: October 03, 1932   Medicare Important Message Given:  Yes    Nathen May 10/23/2016, 10:49 AM

## 2016-10-23 NOTE — Progress Notes (Signed)
PROGRESS NOTE    [name]  LJX:[id number] DOB: 1932-04-28 DOA: 10/19/2016 PCP: Pincus Sanes, MD   Brief Narrative: Bradley Bennett is a 83 y.o. male with medical history significant of interstitial lung disease with significant chronic hypoxic respiratory failure who presents with right foot erythema and altered mentation. Patient was brought by his daughter after finding him to be confused. Apparently one week ago he had a respiratory arrest after running out of his supplemental oxygen, he was found down on a parking lot, required bag valve mask ventilation, brought to the hospital stabilized and discharged home. Since then patient has been disoriented, and confused, this symptoms have been persistent and progressive, no improving or worsening factors, no other associated symptoms. Today he was found by his daughter to be more confused, she noticed significant redness of his right foot and brought him to the hospital.  Unable to obtain further history from patient due to confusion, he reports having the foot redness for 7 days, been nonpainful. Most information was been obtained from his daughter at the bedside. Patient seen this AM and was extremely hard of hearing and still slightly confused. Blood Cx revealed that he had a MSSA Bacteremia so ID was consulted. WOC Nurse evaluated wound and recommended Ortho Consultation. Ortho Consulted and will continue to monitor and if does not improve will proceed with necrotic skin debridement. Discussed Case with ID and patient does not need TEE but does need IV Abx for 2 weeks and ok with Dr. Ninetta Lights to place PICC for continual Abx Therapies.   Assessment & Plan:   Active Problems:   Hyperlipidemia   COPD (chronic obstructive pulmonary disease) (HCC)   Postinflammatory pulmonary fibrosis (HCC)   Parkinson disease (HCC)   Hypothyroidism   Dyspnea   Prediabetes   Cellulitis   Sepsis (HCC)   Bacteremia due to methicillin susceptible  Staphylococcus aureus (MSSA)   Hyponatremia  Sepsis due to right foot cellulitis and MSSA Bacteremia.  -Admitted to SDU -Sepsis Physiology is improving -Will D/C IV fluids with isotonic crystalloid solutions at 50 mL per hour -Broad-spectrum antibiotic therapy with IV vancomycin and IV Zosyn narrowed to Ancef. C/w Cefazolin 2 grams q8h -Will keep mean arterial pressure above 65.  -Follow up WBC; was 6.6 -Temperature improved -Right Foot MRI showed Soft tissue ulceration along the medial aspect of the great toe at the level of the first distal phalanx. No evidence of osteomyelitis or drainable fluid collection -Blood Cx Obtained and showed Gram Positive Cocci in Clusters with Methicillin Sensitive Staph Aureus grown -CRP was 5.7 and ESR was 71 -ID Consulted for further evaluation and narrowed IV Abx to Ancef -WOC evaluated and Recommending Ortho for Debridement -Consulted Orthopedics Dr. Ophelia Charter and appreciate him evaluating. Dr. Ophelia Charter recommends continuing IV Abx and will continue to follow and if it does not continue to improve with dressing changes will proceed with some necrotic skin debridement; Ortho to re-evaluate wound -Blood Cx 10/21/16 repeated and showed NGTD at 2 days -ID recommends checking TTE (As below) and unsure whether patient would tolerate TEE due to Lung Disease;  -ABIs done and showed waveforms are within normal waveforms -Discussed with Dr. Rolm Baptise in ID and stated patient did not need TEE because we know where the infection is coming from and ok to order PICC -PICC ordered and patient will need 2 weeks of IV Abx -PT Evaluated and recommending Home Health   Toxic Metabolic Encephalopathy, improved -Related to sepsis, continue neuro checks per unit  protocol. -Improved and daughter feels like he is improved.   Acute on chronic hypoxic respiratory failure from Severe Pulmonary Fibrosis and ILD -Will continue oxygen supplementation, per nasal cannula, to target oxygen  saturation greater than 88%.  -Chest x-ray showed bilateral interstitial infiltrates unchanged from prior films. -C/w Dulera and Incruse -ECHO done and showed Pulmonary arteries: Systolic pressure was moderately to severely increased. PA peak pressure: 56 mm Hg (S). -Continue Supplemental O2  Parkinson's Disease.  -Continue Sinemet 25-100 mg 2 tab po TIDwm and 50-200 mg Tab po qHS -Physical therapy once patient more stable.  -Continue trazodone, buspirone, gabapentin.  -PT/OT Eval and Treat and recommend Home Health PT/OT  Hypothyroid.  -Continue Levothyroxine 150 mcg po Daily  Hyponatremia, improved -Was Mild at 134 and improved to 136 -Continue to Monitor and repeat CMP in AM   Pre-Diabetes -HbA1c was 6.0 -If Blood Sugars Consistently >200 will need SSI -Blood Sugars on BMP have ranged from 91-126  Neuropathy -C/w Gabapentin 100 mg po BID and 400 mg po qHS  HLD -C/w Atorvastatin 20 mg po qHS   DVT prophylaxis: Enoxaparin 40 mg sq q24h Code Status: Partial Code (No CPR) Family Communication: No family present at bedside and attempted to call Daughter Margarita Grizzle to update her but no response Disposition Plan: Anticipate D/C Home within 1-2 Days with IV Abx and Home Health PT  Consultants:   Infectious Diseases  Orthopedic Surgery  WOC Nurse    Procedures:  ECHOCARDIOGRAM Study Conclusions  - Left ventricle: The cavity size was normal. There was mild   concentric hypertrophy. Systolic function was normal. The   estimated ejection fraction was in the range of 60% to 65%. Wall   motion was normal; there were no regional wall motion   abnormalities. Doppler parameters are consistent with abnormal   left ventricular relaxation (grade 1 diastolic dysfunction). - Aortic valve: Transvalvular velocity was within the normal range.   There was no stenosis. There was no regurgitation. - Mitral valve: Transvalvular velocity was within the normal range.   There was no  evidence for stenosis. There was trivial   regurgitation. - Left atrium: The atrium was moderately dilated. - Right ventricle: The cavity size was normal. Wall thickness was   normal. Systolic function was normal. - Tricuspid valve: There was mild regurgitation. - Pulmonary arteries: Systolic pressure was moderately to severely   increased. PA peak pressure: 56 mm Hg (S).  ABI ABIs and waveforms are within normal waveforms. Right was 1.08 and Left was 1.2  Antimicrobials:  Anti-infectives    Start     Dose/Rate Route Frequency Ordered Stop   10/20/16 1500  ceFAZolin (ANCEF) IVPB 2g/100 mL premix     2 g 200 mL/hr over 30 Minutes Intravenous Every 8 hours 10/20/16 1422     10/19/16 2200  piperacillin-tazobactam (ZOSYN) IVPB 3.375 g  Status:  Discontinued     3.375 g 12.5 mL/hr over 240 Minutes Intravenous Every 8 hours 10/19/16 1620 10/20/16 1422   10/19/16 2200  vancomycin (VANCOCIN) IVPB 1000 mg/200 mL premix  Status:  Discontinued     1,000 mg 200 mL/hr over 60 Minutes Intravenous Every 12 hours 10/19/16 1620 10/20/16 1422   10/19/16 1445  piperacillin-tazobactam (ZOSYN) IVPB 3.375 g     3.375 g 100 mL/hr over 30 Minutes Intravenous  Once 10/19/16 1442 10/19/16 1556   10/19/16 1445  vancomycin (VANCOCIN) IVPB 1000 mg/200 mL premix     1,000 mg 200 mL/hr over 60 Minutes Intravenous  Once 10/19/16 1442 10/19/16 1615     Subjective: Seen and examined at bedside and states he had an ok night. Nursing reported that he had a period where he became hypotensive and tachycardic but resolved fairly quickly. No CP or SOB. Patient denied any lightheadedness or dizziness. After episode was seen walking with PT and doing fine.   Objective: Vitals:   10/23/16 1024 10/23/16 1300 10/23/16 1325 10/23/16 1340  BP:  (!) 78/54 (!) 96/59 (!) 108/59  Pulse:   85 80  Resp:      Temp:  98.1 F (36.7 C)    TempSrc:  Oral    SpO2: 99%  93% 100%  Weight:        Intake/Output Summary (Last 24  hours) at 10/23/16 1616 Last data filed at 10/23/16 1300  Gross per 24 hour  Intake             1200 ml  Output             2100 ml  Net             -900 ml   Filed Weights   10/21/16 0647 10/22/16 0400 10/23/16 0429  Weight: 84.5 kg (186 lb 4.8 oz) 84 kg (185 lb 3 oz) 82.6 kg (182 lb 1.6 oz)   Examination: Physical Exam:  Constitutional: Pleasant Caucasian male in NAD. Laying in bed appears comfortabler Eyes: Sclerae anicteric. Lids normal ENMT: MMM. HOH, External ears and nose appear normal Neck: Supple with no appreciable JVD Respiratory: Diminished to auscultation with some rales. No wheezing/crackles. No accessory muscles to breathe, On supplemental O2 and weaned to 6 Liters.  Cardiovascular: RRR; No m/r/g Abdomen: Soft. NT, ND. Bowel sounds present GU: Deferred Musculoskeletal: No contractures; No cyanosis Skin: Warm and Dry. Has Right Toe with Necrosis and ulceration looks worse. Has some erythema with minimal edema Neurologic: CN 2-12 grossly intact. No tremor noted today. HOH but better with Hearing Aides Psychiatric: Pleasant mood and affect. Intact judgement   Data Reviewed: I have personally reviewed following labs and imaging studies  CBC:  Recent Labs Lab 10/19/16 2215 10/20/16 1419 10/21/16 0639 10/22/16 0228 10/23/16 0233  WBC 6.5 5.6 5.4 5.7 6.6  NEUTROABS 5.2 3.9 3.3 3.5 4.1  HGB 13.2 12.0* 12.0* 11.3* 11.3*  HCT 41.2 36.4* 38.2* 35.7* 35.7*  MCV 89.6 89.9 89.9 88.8 88.4  PLT 250 254 249 236 053   Basic Metabolic Panel:  Recent Labs Lab 10/19/16 2342 10/20/16 1419 10/21/16 0639 10/22/16 0228 10/23/16 0233  NA 134* 134* 136 136 136  K 4.0 3.5 3.7 3.5 3.7  CL 102 102 103 102 101  CO2 _0 GLUCOSE 111* 126* 88 92 91  BUN _1 CREATININE 0.87 0.89 0.72 0.61 0.65  CALCIUM 8.3* 8.2* 8.3* 8.1* 8.3*  MG  --  1.7 1.8 1.7 1.7  PHOS  --  2.6 2.8 2.5 2.9   GFR: Estimated Creatinine Clearance: 74.7 mL/min (by C-G formula based  on SCr of 0.65 mg/dL). Liver Function Tests:  Recent Labs Lab 10/19/16 2342 10/20/16 1419 10/21/16 0639 10/22/16 0228 10/23/16 0233  AST _2 ALT 16* 7* 7* <5* <5*  ALKPHOS 83 75 78 72 73  BILITOT 0.9 0.8 0.6 0.7 0.4  PROT 6.5 5.6* 6.1* 5.4* 5.6*  ALBUMIN 2.7* 2.4* 2.7* 2.3* 2.4*   No results for input(s): LIPASE, AMYLASE in the last 168 hours. No results for  input(s): AMMONIA in the last 168 hours. Coagulation Profile:  Recent Labs Lab 10/19/16 1425  INR 0.99   Cardiac Enzymes: No results for input(s): CKTOTAL, CKMB, CKMBINDEX, TROPONINI in the last 168 hours. BNP (last 3 results) No results for input(s): PROBNP in the last 8760 hours. HbA1C: No results for input(s): HGBA1C in the last 72 hours. CBG: No results for input(s): GLUCAP in the last 168 hours. Lipid Profile: No results for input(s): CHOL, HDL, LDLCALC, TRIG, CHOLHDL, LDLDIRECT in the last 72 hours. Thyroid Function Tests: No results for input(s): TSH, T4TOTAL, FREET4, T3FREE, THYROIDAB in the last 72 hours. Anemia Panel: No results for input(s): VITAMINB12, FOLATE, FERRITIN, TIBC, IRON, RETICCTPCT in the last 72 hours. Sepsis Labs:  Recent Labs Lab 10/19/16 1458 10/19/16 1832  LATICACIDVEN 1.81 0.68    Recent Results (from the past 240 hour(s))  Culture, blood (Routine x 2)     Status: Abnormal   Collection Time: 10/19/16  2:30 PM  Result Value Ref Range Status   Specimen Description BLOOD LEFT ARM  Final   Special Requests   Final    BOTTLES DRAWN AEROBIC AND ANAEROBIC Blood Culture adequate volume   Culture  Setup Time   Final    GRAM POSITIVE COCCI IN CLUSTERS IN BOTH AEROBIC AND ANAEROBIC BOTTLES CRITICAL RESULT CALLED TO, READ BACK BY AND VERIFIED WITH: PHARMD A JOHNSTON [phone] MLM    Culture STAPHYLOCOCCUS AUREUS (A)  Final   Report Status 10/22/2016 FINAL  Final   Organism ID, Bacteria STAPHYLOCOCCUS AUREUS  Final      Susceptibility   Staphylococcus aureus - MIC*     CIPROFLOXACIN <=0.5 SENSITIVE Sensitive     ERYTHROMYCIN <=0.25 SENSITIVE Sensitive     GENTAMICIN <=0.5 SENSITIVE Sensitive     OXACILLIN 0.5 SENSITIVE Sensitive     TETRACYCLINE <=1 SENSITIVE Sensitive     VANCOMYCIN <=0.5 SENSITIVE Sensitive     TRIMETH/SULFA <=10 SENSITIVE Sensitive     CLINDAMYCIN <=0.25 SENSITIVE Sensitive     RIFAMPIN <=0.5 SENSITIVE Sensitive     Inducible Clindamycin NEGATIVE Sensitive     * STAPHYLOCOCCUS AUREUS  Blood Culture ID Panel (Reflexed)     Status: Abnormal   Collection Time: 10/19/16  2:30 PM  Result Value Ref Range Status   Enterococcus species NOT DETECTED NOT DETECTED Final   Vancomycin resistance NOT DETECTED NOT DETECTED Final   Listeria monocytogenes NOT DETECTED NOT DETECTED Final   Staphylococcus species DETECTED (A) NOT DETECTED Final    Comment: CRITICAL RESULT CALLED TO, READ BACK BY AND VERIFIED WITH: PHARMD A JOHNSTON [id number] MLM    Staphylococcus aureus DETECTED (A) NOT DETECTED Final    Comment: CRITICAL RESULT CALLED TO, READ BACK BY AND VERIFIED WITH: PHARMD A JOHNSTON [phone] MLM    Methicillin resistance NOT DETECTED NOT DETECTED Final   Streptococcus species NOT DETECTED NOT DETECTED Final   Streptococcus agalactiae NOT DETECTED NOT DETECTED Final   Streptococcus pneumoniae NOT DETECTED NOT DETECTED Final   Streptococcus pyogenes NOT DETECTED NOT DETECTED Final   Acinetobacter baumannii NOT DETECTED NOT DETECTED Final   Enterobacteriaceae species NOT DETECTED NOT DETECTED Final   Enterobacter cloacae complex NOT DETECTED NOT DETECTED Final   Escherichia coli NOT DETECTED NOT DETECTED Final   Klebsiella oxytoca NOT DETECTED NOT DETECTED Final   Klebsiella pneumoniae NOT DETECTED NOT DETECTED Final   Proteus species NOT DETECTED NOT DETECTED Final   Serratia marcescens NOT DETECTED NOT DETECTED Final   Carbapenem resistance NOT  DETECTED NOT DETECTED Final   Haemophilus influenzae NOT DETECTED NOT DETECTED Final    Neisseria meningitidis NOT DETECTED NOT DETECTED Final   Pseudomonas aeruginosa NOT DETECTED NOT DETECTED Final   Candida albicans NOT DETECTED NOT DETECTED Final   Candida glabrata NOT DETECTED NOT DETECTED Final   Candida krusei NOT DETECTED NOT DETECTED Final   Candida parapsilosis NOT DETECTED NOT DETECTED Final   Candida tropicalis NOT DETECTED NOT DETECTED Final  Culture, blood (Routine x 2)     Status: Abnormal   Collection Time: 10/19/16  2:41 PM  Result Value Ref Range Status   Specimen Description BLOOD RIGHT ARM  Final   Special Requests   Final    BOTTLES DRAWN AEROBIC AND ANAEROBIC Blood Culture adequate volume   Culture  Setup Time   Final    GRAM POSITIVE COCCI IN CLUSTERS IN BOTH AEROBIC AND ANAEROBIC BOTTLES CRITICAL VALUE NOTED.  VALUE IS CONSISTENT WITH PREVIOUSLY REPORTED AND CALLED VALUE.    Culture (A)  Final    STAPHYLOCOCCUS AUREUS SUSCEPTIBILITIES PERFORMED ON PREVIOUS CULTURE WITHIN THE LAST 5 DAYS.    Report Status 10/22/2016 FINAL  Final  MRSA PCR Screening     Status: None   Collection Time: 10/20/16 12:11 AM  Result Value Ref Range Status   MRSA by PCR NEGATIVE NEGATIVE Final    Comment:        The GeneXpert MRSA Assay (FDA approved for NASAL specimens only), is one component of a comprehensive MRSA colonization surveillance program. It is not intended to diagnose MRSA infection nor to guide or monitor treatment for MRSA infections.   Culture, blood (routine x 2)     Status: None (Preliminary result)   Collection Time: 10/21/16  6:45 AM  Result Value Ref Range Status   Specimen Description BLOOD RIGHT ANTECUBITAL  Final   Special Requests   Final    BOTTLES DRAWN AEROBIC AND ANAEROBIC Blood Culture adequate volume   Culture NO GROWTH 2 DAYS  Final   Report Status PENDING  Incomplete  Culture, blood (routine x 2)     Status: None (Preliminary result)   Collection Time: 10/21/16  6:50 AM  Result Value Ref Range Status   Specimen Description  BLOOD RIGHT HAND  Final   Special Requests IN PEDIATRIC BOTTLE Blood Culture adequate volume  Final   Culture NO GROWTH 2 DAYS  Final   Report Status PENDING  Incomplete    Radiology Studies: No results found. Scheduled Meds: . alfuzosin  10 mg Oral Daily  . atorvastatin  20 mg Oral Daily  . busPIRone  5 mg Oral BID  . carbidopa-levodopa  1 tablet Oral QHS  . carbidopa-levodopa  2 tablet Oral TID WC  . clopidogrel  75 mg Oral Daily  . enoxaparin (LOVENOX) injection  40 mg Subcutaneous Q24H  . finasteride  5 mg Oral QPM  . fluticasone  1 spray Each Nare Daily  . gabapentin  100 mg Oral BID   And  . gabapentin  400 mg Oral QHS  . lactose free nutrition  237 mL Oral TID WC  . levothyroxine  150 mcg Oral QAC breakfast  . mirabegron ER  50 mg Oral QPM  . mometasone-formoterol  2 puff Inhalation BID  . multivitamin  1 tablet Oral Daily  . multivitamin with minerals  1 tablet Oral Q1200  . mupirocin cream   Topical Daily  . mupirocin ointment  1 application Nasal BID  . polyethylene glycol  17 g  Oral Once  . sodium chloride flush  3 mL Intravenous Q12H  . sodium chloride flush  3 mL Intravenous Q12H  . traZODone  100 mg Oral QHS  . umeclidinium bromide  1 puff Inhalation Daily   Continuous Infusions: . sodium chloride 250 mL (10/23/16 1543)  .  ceFAZolin (ANCEF) IV 2 g (10/23/16 1455)    LOS: 4 days   Kerney Elbe, DO [facility] Pager [phone]  If 7PM-7AM, please contact night-coverage [email or web address] Password [email or web address] 10/23/2016, 4:16 PM

## 2016-10-23 NOTE — Progress Notes (Signed)
INFECTIOUS DISEASE PROGRESS NOTE  ID: Bradley Bennett is a 83 y.o. male with  Active Problems:   Hyperlipidemia   COPD (chronic obstructive pulmonary disease) (HCC)   Postinflammatory pulmonary fibrosis (HCC)   Parkinson disease (HCC)   Hypothyroidism   Dyspnea   Prediabetes   Cellulitis   Sepsis ([place])   Bacteremia due to methicillin susceptible Staphylococcus aureus (MSSA)   Hyponatremia  Subjective: Without complaints  Abtx:  Anti-infectives    Start     Dose/Rate Route Frequency Ordered Stop   10/20/16 1500  ceFAZolin (ANCEF) IVPB 2g/100 mL premix     2 g 200 mL/hr over 30 Minutes Intravenous Every 8 hours 10/20/16 1422     10/19/16 2200  piperacillin-tazobactam (ZOSYN) IVPB 3.375 g  Status:  Discontinued     3.375 g 12.5 mL/hr over 240 Minutes Intravenous Every 8 hours 10/19/16 1620 10/20/16 1422   10/19/16 2200  vancomycin (VANCOCIN) IVPB 1000 mg/200 mL premix  Status:  Discontinued     1,000 mg 200 mL/hr over 60 Minutes Intravenous Every 12 hours 10/19/16 1620 10/20/16 1422   10/19/16 1445  piperacillin-tazobactam (ZOSYN) IVPB 3.375 g     3.375 g 100 mL/hr over 30 Minutes Intravenous  Once 10/19/16 1442 10/19/16 1556   10/19/16 1445  vancomycin (VANCOCIN) IVPB 1000 mg/200 mL premix     1,000 mg 200 mL/hr over 60 Minutes Intravenous  Once 10/19/16 1442 10/19/16 1615      Medications:  Scheduled: . alfuzosin  10 mg Oral Daily  . atorvastatin  20 mg Oral Daily  . busPIRone  5 mg Oral BID  . carbidopa-levodopa  1 tablet Oral QHS  . carbidopa-levodopa  2 tablet Oral TID WC  . clopidogrel  75 mg Oral Daily  . enoxaparin (LOVENOX) injection  40 mg Subcutaneous Q24H  . finasteride  5 mg Oral QPM  . fluticasone  1 spray Each Nare Daily  . gabapentin  100 mg Oral BID   And  . gabapentin  400 mg Oral QHS  . lactose free nutrition  237 mL Oral TID WC  . levothyroxine  150 mcg Oral QAC breakfast  . mirabegron ER  50 mg Oral QPM  . mometasone-formoterol  2 puff  Inhalation BID  . multivitamin  1 tablet Oral Daily  . multivitamin with minerals  1 tablet Oral Q1200  . mupirocin cream   Topical Daily  . mupirocin ointment  1 application Nasal BID  . polyethylene glycol  17 g Oral Once  . sodium chloride flush  3 mL Intravenous Q12H  . sodium chloride flush  3 mL Intravenous Q12H  . traZODone  100 mg Oral QHS  . umeclidinium bromide  1 puff Inhalation Daily    Objective: Vital signs in last 24 hours: Temp:  [98.1 F (36.7 C)-98.4 F (36.9 C)] 98.1 F (36.7 C) (09/17 1300) Pulse Rate:  [74-103] 80 (09/17 1340) Resp:  [17-28] 17 (09/17 0429) BP: (78-119)/(54-73) 108/59 (09/17 1340) SpO2:  [83 %-100 %] 100 % (09/17 1340) Weight:  [82.6 kg (182 lb 1.6 oz)] 82.6 kg (182 lb 1.6 oz) (09/17 0429)   General appearance: alert, cooperative and no distress Extremities: erythema nearly resolved. wound appears improving.   Lab Results  Recent Labs  10/22/16 0228 10/23/16 0233  WBC 5.7 6.6  HGB 11.3* 11.3*  HCT 35.7* 35.7*  NA 136 136  K 3.5 3.7  CL 102 101  CO2 29 29  BUN 7 7  CREATININE 0.61 0.65  Liver Panel  Recent Labs  10/22/16 0228 10/23/16 0233  PROT 5.4* 5.6*  ALBUMIN 2.3* 2.4*  AST 23 23  ALT <5* <5*  ALKPHOS 72 73  BILITOT 0.7 0.4   Sedimentation Rate No results for input(s): ESRSEDRATE in the last 72 hours. C-Reactive Protein No results for input(s): CRP in the last 72 hours.  Microbiology: Recent Results (from the past 240 hour(s))  Culture, blood (Routine x 2)     Status: Abnormal   Collection Time: 10/19/16  2:30 PM  Result Value Ref Range Status   Specimen Description BLOOD LEFT ARM  Final   Special Requests   Final    BOTTLES DRAWN AEROBIC AND ANAEROBIC Blood Culture adequate volume   Culture  Setup Time   Final    GRAM POSITIVE COCCI IN CLUSTERS IN BOTH AEROBIC AND ANAEROBIC BOTTLES CRITICAL RESULT CALLED TO, READ BACK BY AND VERIFIED WITH: PHARMD A JOHNSTON [id number] MLM    Culture STAPHYLOCOCCUS  AUREUS (A)  Final   Report Status 10/22/2016 FINAL  Final   Organism ID, Bacteria STAPHYLOCOCCUS AUREUS  Final      Susceptibility   Staphylococcus aureus - MIC*    CIPROFLOXACIN <=0.5 SENSITIVE Sensitive     ERYTHROMYCIN <=0.25 SENSITIVE Sensitive     GENTAMICIN <=0.5 SENSITIVE Sensitive     OXACILLIN 0.5 SENSITIVE Sensitive     TETRACYCLINE <=1 SENSITIVE Sensitive     VANCOMYCIN <=0.5 SENSITIVE Sensitive     TRIMETH/SULFA <=10 SENSITIVE Sensitive     CLINDAMYCIN <=0.25 SENSITIVE Sensitive     RIFAMPIN <=0.5 SENSITIVE Sensitive     Inducible Clindamycin NEGATIVE Sensitive     * STAPHYLOCOCCUS AUREUS  Blood Culture ID Panel (Reflexed)     Status: Abnormal   Collection Time: 10/19/16  2:30 PM  Result Value Ref Range Status   Enterococcus species NOT DETECTED NOT DETECTED Final   Vancomycin resistance NOT DETECTED NOT DETECTED Final   Listeria monocytogenes NOT DETECTED NOT DETECTED Final   Staphylococcus species DETECTED (A) NOT DETECTED Final    Comment: CRITICAL RESULT CALLED TO, READ BACK BY AND VERIFIED WITH: PHARMD A JOHNSTON [phone] MLM    Staphylococcus aureus DETECTED (A) NOT DETECTED Final    Comment: CRITICAL RESULT CALLED TO, READ BACK BY AND VERIFIED WITH: PHARMD A JOHNSTON [id number] MLM    Methicillin resistance NOT DETECTED NOT DETECTED Final   Streptococcus species NOT DETECTED NOT DETECTED Final   Streptococcus agalactiae NOT DETECTED NOT DETECTED Final   Streptococcus pneumoniae NOT DETECTED NOT DETECTED Final   Streptococcus pyogenes NOT DETECTED NOT DETECTED Final   Acinetobacter baumannii NOT DETECTED NOT DETECTED Final   Enterobacteriaceae species NOT DETECTED NOT DETECTED Final   Enterobacter cloacae complex NOT DETECTED NOT DETECTED Final   Escherichia coli NOT DETECTED NOT DETECTED Final   Klebsiella oxytoca NOT DETECTED NOT DETECTED Final   Klebsiella pneumoniae NOT DETECTED NOT DETECTED Final   Proteus species NOT DETECTED NOT DETECTED Final    Serratia marcescens NOT DETECTED NOT DETECTED Final   Carbapenem resistance NOT DETECTED NOT DETECTED Final   Haemophilus influenzae NOT DETECTED NOT DETECTED Final   Neisseria meningitidis NOT DETECTED NOT DETECTED Final   Pseudomonas aeruginosa NOT DETECTED NOT DETECTED Final   Candida albicans NOT DETECTED NOT DETECTED Final   Candida glabrata NOT DETECTED NOT DETECTED Final   Candida krusei NOT DETECTED NOT DETECTED Final   Candida parapsilosis NOT DETECTED NOT DETECTED Final   Candida tropicalis NOT DETECTED NOT DETECTED Final  Culture, blood (Routine x 2)     Status: Abnormal   Collection Time: 10/19/16  2:41 PM  Result Value Ref Range Status   Specimen Description BLOOD RIGHT ARM  Final   Special Requests   Final    BOTTLES DRAWN AEROBIC AND ANAEROBIC Blood Culture adequate volume   Culture  Setup Time   Final    GRAM POSITIVE COCCI IN CLUSTERS IN BOTH AEROBIC AND ANAEROBIC BOTTLES CRITICAL VALUE NOTED.  VALUE IS CONSISTENT WITH PREVIOUSLY REPORTED AND CALLED VALUE.    Culture (A)  Final    STAPHYLOCOCCUS AUREUS SUSCEPTIBILITIES PERFORMED ON PREVIOUS CULTURE WITHIN THE LAST 5 DAYS.    Report Status 10/22/2016 FINAL  Final  MRSA PCR Screening     Status: None   Collection Time: 10/20/16 12:11 AM  Result Value Ref Range Status   MRSA by PCR NEGATIVE NEGATIVE Final    Comment:        The GeneXpert MRSA Assay (FDA approved for NASAL specimens only), is one component of a comprehensive MRSA colonization surveillance program. It is not intended to diagnose MRSA infection nor to guide or monitor treatment for MRSA infections.   Culture, blood (routine x 2)     Status: None (Preliminary result)   Collection Time: 10/21/16  6:45 AM  Result Value Ref Range Status   Specimen Description BLOOD RIGHT ANTECUBITAL  Final   Special Requests   Final    BOTTLES DRAWN AEROBIC AND ANAEROBIC Blood Culture adequate volume   Culture NO GROWTH 1 DAY  Final   Report Status PENDING   Incomplete  Culture, blood (routine x 2)     Status: None (Preliminary result)   Collection Time: 10/21/16  6:50 AM  Result Value Ref Range Status   Specimen Description BLOOD RIGHT HAND  Final   Special Requests IN PEDIATRIC BOTTLE Blood Culture adequate volume  Final   Culture NO GROWTH 1 DAY  Final   Report Status PENDING  Incomplete    Studies/Results: No results found.   Assessment/Plan: Cellulitis Staph bacteremia Neuropathy  Total days of antibiotics: 4 ancef  Repeat BCx are no growth so far He needs 2 weeks of IV anbx (ancef) D/c when ready Will f/u his Cx Have placed opat consult.  Available as needed.          Bobby Rumpf Infectious Diseases (pager) [phone] [email or web address] 10/23/2016, 2:56 PM  LOS: 4 days

## 2016-10-23 NOTE — Evaluation (Signed)
Physical Therapy Evaluation Patient Details Name: Bradley Bennett MRN: [id number] DOB: 13-Jul-1932 Today's Date: 10/23/2016   History of Present Illness  AMILIO ZEHNDER is a 83 y.o. male with medical history significant of Parkinson's disease, interstitial lung disease with significant chronic hypoxic respiratory failure who presents with right foot erythema and altered mentation.   Clinical Impression  Patient presents with decreased mobility due to limited activity tolerance, balance and gait.  He will benefit from skilled PT in the acute setting to allow return home to [place] apartment with follow up [place].    Follow Up Recommendations Home health PT    Equipment Recommendations  None recommended by PT    Recommendations for Other Services       Precautions / Restrictions Precautions Precautions: Fall Precaution Comments: O2 dependent      Mobility  Bed Mobility Overal bed mobility: Modified Independent                Transfers Overall transfer level: Modified independent Equipment used: None             General transfer comment: increased time and use of UE's for sit to stand, (some assist to manage lines)  Ambulation/Gait Ambulation/Gait assistance: Min guard;Supervision Ambulation Distance (Feet): 130 Feet Assistive device: Rolling walker (2 wheeled) Gait Pattern/deviations: Step-to pattern;Decreased dorsiflexion - right;Decreased dorsiflexion - left;Trunk flexed;Decreased stride length     General Gait Details: managing gait abnormalities fairly well, short shuffling steps with limited A-P translation of weight in feet with stiff great toes bilaterally; some use of walker to steady at times, but not heavy support needed, maintained on 10-15L O2 throughout  Stairs            Wheelchair Mobility    Modified Rankin (Stroke Patients Only)       Balance Overall balance assessment: Needs assistance   Sitting balance-Leahy Scale: Good      Standing balance support: During functional activity Standing balance-Leahy Scale: Good Standing balance comment: stood at sink several minutes to attempt to shave, comb hair and put teeth in for lunch.  No LOB, some time with oxygen out of his nose and SpO2 at once time as low as 67% (but back up to 87% quickly)                             Pertinent Vitals/Pain Pain Assessment: No/denies pain    Home Living Family/patient expects to be discharged to:: Private residence Living Arrangements: Alone   Type of Home: Independent living facility [facility]) Home Access: Elevator     Home Layout: One level Home Equipment: Environmental consultant - 4 wheels      Prior Function Level of Independence: Independent         Comments: uses walker when he has to carry something     Hand Dominance        Extremity/Trunk Assessment   Upper Extremity Assessment Upper Extremity Assessment: Overall WFL for tasks assessed    Lower Extremity Assessment Lower Extremity Assessment: RLE deficits/detail;LLE deficits/detail RLE Deficits / Details: Moves legs without difficulty, ankle AROM slightly limited DF,and great toes stiff throughout ambulation bilaterally LLE Deficits / Details: Moves legs without difficulty, ankle AROM slightly limited DF,and great toes stiff throughout ambulation bilaterally    Cervical / Trunk Assessment Cervical / Trunk Assessment: Kyphotic  Communication   Communication: HOH (wears hearing aides)  Cognition Arousal/Alertness: Awake/alert Behavior During Therapy: WFL for tasks assessed/performed Overall  Cognitive Status: Within Functional Limits for tasks assessed                                        General Comments      Exercises     Assessment/Plan    PT Assessment Patient needs continued PT services  PT Problem List Decreased balance;Decreased knowledge of use of DME;Decreased cognition;Decreased activity tolerance;Decreased  mobility       PT Treatment Interventions DME instruction;Therapeutic activities;Patient/family education;Therapeutic exercise;Gait training;Balance training;Functional mobility training    PT Goals (Current goals can be found in the Care Plan section)  Acute Rehab PT Goals Patient Stated Goal: To return to independent PT Goal Formulation: With patient Time For Goal Achievement: 10/28/16 Potential to Achieve Goals: Good    Frequency Min 3X/week   Barriers to discharge        Co-evaluation               AM-PAC PT "6 Clicks" Daily Activity  Outcome Measure Difficulty turning over in bed (including adjusting bedclothes, sheets and blankets)?: None Difficulty moving from lying on back to sitting on the side of the bed? : None Difficulty sitting down on and standing up from a chair with arms (e.g., wheelchair, bedside commode, etc,.)?: A Little Help needed moving to and from a bed to chair (including a wheelchair)?: A Little Help needed walking in hospital room?: A Little Help needed climbing 3-5 steps with a railing? : A Little 6 Click Score: 20    End of Session Equipment Utilized During Treatment: Gait belt;Oxygen Activity Tolerance: Patient tolerated treatment well Patient left: with family/visitor present;in chair   PT Visit Diagnosis: Other abnormalities of gait and mobility (R26.89)    Time: 8309-4076 PT Time Calculation (min) (ACUTE ONLY): 23 min   Charges:   PT Evaluation $PT Eval Moderate Complexity: 1 Mod PT Treatments $Gait Training: 8-22 mins   PT G CodesMagda Kiel, [place] [phone] 10/23/2016   Reginia Naas 10/23/2016, 12:35 PM

## 2016-10-23 NOTE — Evaluation (Signed)
Occupational Therapy Evaluation Patient Details Name: Bradley Bennett MRN: [id number] DOB: [birth year] Today's Date: 10/23/2016    History of Present Illness Bradley Bennett is a 83 y.o. male with medical history significant of Parkinson's disease, interstitial lung disease with significant chronic hypoxic respiratory failure who presents with right foot erythema and altered mentation.    Clinical Impression   Pt admitted with above. He demonstrates the below listed deficits and will benefit from continued OT to maximize safety and independence with BADLs.  Pt is able to perform ADLs at supervision level with DOE 3/4.  02 sats decreased to low 80s on 15L supplemental 02, but quickly rebound into mid 90s upon rest.  Pt resides in an independent living facility, and was mod I PTA, including driving.  He was in the maintenance program at [facility] pulmonary rehab.  No follow up OT recommended, but eventual transition back into pulmonary rehab once cleared by HHPT       Follow Up Recommendations  Pulmonary rehab     Equipment Recommendations  None recommended by OT    Recommendations for Other Services       Precautions / Restrictions Precautions Precautions: Fall Precaution Comments: O2 dependent      Mobility Bed Mobility Overal bed mobility: Modified Independent                Transfers Overall transfer level: Modified independent Equipment used: None                  Balance Overall balance assessment: Needs assistance   Sitting balance-Leahy Scale: Good     Standing balance support: During functional activity;No upper extremity supported Standing balance-Leahy Scale: Good                             ADL either performed or assessed with clinical judgement   ADL Overall ADL's : Needs assistance/impaired Eating/Feeding: Independent   Grooming: Wash/dry hands;Wash/dry face;Oral care;Brushing hair;Supervision/safety;Standing   Upper Body  Bathing: Set up;Sitting   Lower Body Bathing: Supervison/ safety;Sit to/from stand   Upper Body Dressing : Set up;Sitting   Lower Body Dressing: Supervision/safety;Sit to/from stand Lower Body Dressing Details (indicate cue type and reason): DOE 3/4 with donning socks  Toilet Transfer: Supervision/safety;Ambulation;Comfort height toilet;Grab bars;RW   Toileting- Clothing Manipulation and Hygiene: Supervision/safety;Sit to/from stand       Functional mobility during ADLs: Supervision/safety;Rolling walker       Vision         Perception     Praxis      Pertinent Vitals/Pain Pain Assessment: No/denies pain     Hand Dominance Right   Extremity/Trunk Assessment Upper Extremity Assessment Upper Extremity Assessment: Overall WFL for tasks assessed   Lower Extremity Assessment Lower Extremity Assessment: Defer to PT evaluation   Cervical / Trunk Assessment Cervical / Trunk Assessment: Kyphotic   Communication Communication Communication: HOH   Cognition Arousal/Alertness: Awake/alert Behavior During Therapy: WFL for tasks assessed/performed Overall Cognitive Status: Within Functional Limits for tasks assessed                                     General Comments  02 sats decreased to low 80s on 15L supplemental 02, but quickly rebounded to mid 90s with rest     Exercises     Shoulder Instructions      Home Living Family/patient  expects to be discharged to:: Private residence Living Arrangements: Alone Available Help at Discharge: Friend(s);Available PRN/intermittently Type of Home: Independent living facility Home Access: Elevator     Home Layout: One level [facility] )     Bathroom Shower/Tub: Hospital doctor Toilet: Handicapped height Bathroom Accessibility: Yes   Home Equipment: Environmental consultant - 4 wheels;Grab bars - toilet;Grab bars - tub/shower;Shower seat - built in          Prior Functioning/Environment Level of  Independence: Independent        Comments: is in the maintenance program at pulmonary rehab.  uses walker when he has to carry something        OT Problem List: Decreased strength;Decreased activity tolerance;Cardiopulmonary status limiting activity      OT Treatment/Interventions: Self-care/ADL training;Therapeutic exercise;Energy conservation;DME and/or AE instruction;Therapeutic activities;Patient/family education    OT Goals(Current goals can be found in the care plan section) Acute Rehab OT Goals Patient Stated Goal: to go home ASAP  OT Goal Formulation: With patient Time For Goal Achievement: 11/06/16 Potential to Achieve Goals: Good ADL Goals Pt Will Perform Grooming: with modified independence;standing Pt Will Perform Upper Body Bathing: with modified independence;sitting Pt Will Perform Lower Body Bathing: with modified independence;sit to/from stand Pt Will Perform Upper Body Dressing: with modified independence;sitting Pt Will Perform Lower Body Dressing: with modified independence;sit to/from stand Pt Will Transfer to Toilet: with modified independence;ambulating;regular height toilet;grab bars Pt Will Perform Toileting - Clothing Manipulation and hygiene: with modified independence;sit to/from stand  OT Frequency: Min 2X/week   Barriers to D/C: Decreased caregiver support          Co-evaluation              AM-PAC PT "6 Clicks" Daily Activity     Outcome Measure Help from another person eating meals?: None Help from another person taking care of personal grooming?: A Little Help from another person toileting, which includes using toliet, bedpan, or urinal?: A Little Help from another person bathing (including washing, rinsing, drying)?: A Little Help from another person to put on and taking off regular upper body clothing?: A Little Help from another person to put on and taking off regular lower body clothing?: A Little 6 Click Score: 19   End of  Session Equipment Utilized During Treatment: Rolling walker;Oxygen Nurse Communication: Mobility status  Activity Tolerance: Patient tolerated treatment well Patient left: in bed;with call bell/phone within reach  OT Visit Diagnosis: Unsteadiness on feet (R26.81)                Time: 7510-2585 OT Time Calculation (min): 37 min Charges:  OT General Charges $OT Visit: 1 Visit OT Evaluation $OT Eval Moderate Complexity: 1 Mod OT Treatments $Self Care/Home Management : 8-22 mins G-Codes:     [facility], OTR/L [phone]   [name] 10/23/2016, 6:42 PM

## 2016-10-23 NOTE — Progress Notes (Signed)
PHARMACY CONSULT NOTE FOR:  OUTPATIENT  PARENTERAL ANTIBIOTIC THERAPY (OPAT)  Indication: MSSA bacteremia Regimen: Cefazolin 2g IV Q8h End date: 11/03/16  Repeat blood cx was drawn on 9/15 and is ngtd so far. Will start 2 weeks of treatment from 9/15.  IV antibiotic discharge orders are pended. To discharging provider:  please sign these orders via discharge navigator,  Select New Orders & click on the button choice - Manage This Unsigned Work.    Thank you for allowing pharmacy to be a part of this patient's care.  Elenor Quinones, PharmD, BCPS Clinical Pharmacist Pager [phone] 10/23/2016 3:09 PM

## 2016-10-23 NOTE — Progress Notes (Signed)
OT Cancellation Note  Patient Details Name: Bradley Bennett MRN: [id number] DOB: 09/22/1932   Cancelled Treatment:    Reason Eval/Treat Not Completed: Medical issues which prohibited therapy.  Currently BP 96/59 - RN with pt.  Will reattempt.  [name], OTR/L [phone]   [name] 10/23/2016, 1:25 PM

## 2016-10-23 NOTE — Progress Notes (Signed)
MD paged through [facility] system to update on patient. Charity Tessier, Bettina Gavia rN

## 2016-10-23 NOTE — Progress Notes (Signed)
Came for 1300 vitals, patient was up in chair, BP was 78/54 pulse spiked to 158. Pt appeared sleepy and had a change in his LOC, did not answer questions appropiately. Nursing staff assisted patient to bed, bp up to 108/59 patient is alert and oriented will continue to monitor closely. Cherylynn Ridges

## 2016-10-24 ENCOUNTER — Inpatient Hospital Stay (HOSPITAL_COMMUNITY): Admission: RE | Admit: 2016-10-24 | Payer: Medicare Other | Source: Ambulatory Visit

## 2016-10-24 DIAGNOSIS — R0902 Hypoxemia: Secondary | ICD-10-CM

## 2016-10-24 DIAGNOSIS — E876 Hypokalemia: Secondary | ICD-10-CM

## 2016-10-24 LAB — COMPREHENSIVE METABOLIC PANEL
ALBUMIN: 2.4 g/dL — AB (ref 3.5–5.0)
ALK PHOS: 73 U/L (ref 38–126)
ALT: 5 U/L — ABNORMAL LOW (ref 17–63)
AST: 22 U/L (ref 15–41)
Anion gap: 7 (ref 5–15)
BUN: 7 mg/dL (ref 6–20)
CALCIUM: 8.3 mg/dL — AB (ref 8.9–10.3)
CO2: 30 mmol/L (ref 22–32)
Chloride: 100 mmol/L — ABNORMAL LOW (ref 101–111)
Creatinine, Ser: 0.67 mg/dL (ref 0.61–1.24)
GFR calc Af Amer: >60 mL/min (ref >60)
GFR calc non Af Amer: >60 mL/min (ref >60)
GLUCOSE: 89 mg/dL (ref 65–99)
POTASSIUM: 3.4 mmol/L — AB (ref 3.5–5.1)
SODIUM: 137 mmol/L (ref 135–145)
TOTAL PROTEIN: 5.8 g/dL — AB (ref 6.5–8.1)
Total Bilirubin: 0.9 mg/dL (ref 0.3–1.2)

## 2016-10-24 LAB — CBC WITH DIFFERENTIAL/PLATELET
Basophils Absolute: 0 10*3/uL (ref 0.0–0.1)
Basophils Relative: 0 %
Eosinophils Absolute: 0.4 10*3/uL (ref 0.0–0.7)
Eosinophils Relative: 7 %
HEMATOCRIT: 37.5 % — AB (ref 39.0–52.0)
HEMOGLOBIN: 11.8 g/dL — AB (ref 13.0–17.0)
LYMPHS PCT: 19 %
Lymphs Abs: 1.2 10*3/uL (ref 0.7–4.0)
MCH: 27.8 pg (ref 26.0–34.0)
MCHC: 31.5 g/dL (ref 30.0–36.0)
MCV: 88.4 fL (ref 78.0–100.0)
MONO ABS: 0.9 10*3/uL (ref 0.1–1.0)
MONOS PCT: 14 %
NEUTROS ABS: 3.7 10*3/uL (ref 1.7–7.7)
Neutrophils Relative %: 60 %
Platelets: 282 10*3/uL (ref 150–400)
RBC: 4.24 MIL/uL (ref 4.22–5.81)
RDW: 16.5 % — ABNORMAL HIGH (ref 11.5–15.5)
WBC: 6.2 10*3/uL (ref 4.0–10.5)

## 2016-10-24 LAB — PHOSPHORUS: Phosphorus: 3.1 mg/dL (ref 2.5–4.6)

## 2016-10-24 LAB — MAGNESIUM: Magnesium: 1.7 mg/dL (ref 1.7–2.4)

## 2016-10-24 MED ORDER — SODIUM CHLORIDE 0.9% FLUSH: 10.0000 mL | INTRAVENOUS | Status: DC | PRN

## 2016-10-24 MED ORDER — CEFAZOLIN IV (FOR PTA / DISCHARGE USE ONLY): 2.0000 g | Freq: Three times a day (TID) | INTRAVENOUS | 0 refills | Status: DC

## 2016-10-24 MED ORDER — SODIUM CHLORIDE 0.9% FLUSH
10.0000 mL | Freq: Two times a day (BID) | INTRAVENOUS | Status: DC
Start: 2016-10-24 — End: 2016-10-24

## 2016-10-24 MED ORDER — POTASSIUM CHLORIDE CRYS ER 20 MEQ PO TBCR
40.0000 meq | EXTENDED_RELEASE_TABLET | Freq: Two times a day (BID) | ORAL | Status: DC
  Administered 2016-10-24: 40 meq via ORAL
  Filled 2016-10-24: qty 2

## 2016-10-24 MED ORDER — BOOST PLUS PO LIQD: 237.0000 mL | Freq: Three times a day (TID) | ORAL | 0 refills | Status: AC

## 2016-10-24 NOTE — Progress Notes (Signed)
Pt changed to 6L oxygen with humidity via regular nasal cannula. Removed HFNC. Pt oxygen saturation remains 91-96% on 6L [place]. Will continue to monitor.   Fritz Pickerel, RN

## 2016-10-24 NOTE — Progress Notes (Signed)
PROGRESS NOTE    Bradley Bennett  RDE:[id number] DOB: 08-Oct-1932 DOA: 10/19/2016 PCP: [name], MD   Brief Narrative: Bradley Bennett is a 83 y.o. male with medical history significant of interstitial lung disease with significant chronic hypoxic respiratory failure who presents with right foot erythema and altered mentation. Patient was brought by his daughter after finding him to be confused. Apparently one week ago he had a respiratory arrest after running out of his supplemental oxygen, he was found down on a parking lot, required bag valve mask ventilation, brought to the hospital stabilized and discharged home. Since then patient has been disoriented, and confused, this symptoms have been persistent and progressive, no improving or worsening factors, no other associated symptoms. Today he was found by his daughter to be more confused, she noticed significant redness of his right foot and brought him to the hospital.  Unable to obtain further history from patient due to confusion, he reports having the foot redness for 7 days, been nonpainful. Most information was obtained from his daughter at the bedside. Blood Cx revealed that he had a MSSA Bacteremia so ID was consulted. [facility] Nurse evaluated wound and recommended Ortho Consultation. Ortho Consulted and will continue to monitor and if does not improve will proceed with necrotic skin debridement. Discussed Case with ID and patient does not need TEE but does need IV Abx for 2 weeks and ok with Dr. Johnnye Sima to place PICC for continual Abx Therapies. I discussed the case with Dr. Lorin Mercy of Orthopedics and he recommended follow up within 1 week of Discharge. Still awaiting for PICC to be placed for IV Abx therapies for home and once placed can likely be D/C'd in AM.   Assessment & Plan:   Active Problems:   Hyperlipidemia   COPD (chronic obstructive pulmonary disease) (HCC)   Postinflammatory pulmonary fibrosis (HCC)   Parkinson disease  (HCC)   Hypothyroidism   Dyspnea   Prediabetes   Cellulitis   Sepsis ([place])   Bacteremia due to methicillin susceptible Staphylococcus aureus (MSSA)   Hyponatremia   Hypokalemia  Sepsis due to right foot cellulitis and MSSA Bacteremia.  -Admitted to SDU -Sepsis Physiology is improving -Will D/C IV fluids with isotonic crystalloid solutions at 50 mL per hour -Broad-spectrum antibiotic therapy with IV vancomycin and IV Zosyn narrowed to Ancef. C/w Cefazolin 2 grams q8h -Will keep mean arterial pressure above 65.  -Follow up WBC; was 6.6 -Temperature improved -Right Foot MRI showed Soft tissue ulceration along the medial aspect of the great toe at the level of the first distal phalanx. No evidence of osteomyelitis or drainable fluid collection -Blood Cx Obtained and showed Gram Positive Cocci in Clusters with Methicillin Sensitive Staph Aureus grown -CRP was 5.7 and ESR was 71 -ID Consulted for further evaluation and narrowed IV Abx to Ancef -WOC evaluated and Recommending Ortho for Debridement -Consulted Orthopedics Dr. Lorin Mercy and appreciate him evaluating. Dr. Lorin Mercy recommends continuing IV Abx and will continue to follow and if it does not continue to improve with dressing changes will proceed with some necrotic skin debridement; Discussed with Ortho Dr. Lorin Mercy and he stated it would be ok to D/C Home and have patient follow up within 1 week after discharge; Dr. Lorin Mercy states he would want some granulation tissue to appear first prior to Debridement  -Blood Cx 10/21/16 repeated and showed NGTD at 2 days -ID recommended checking TTE (As below)  -ABIs done and showed waveforms are within normal waveforms -Discussed with Dr.  Hacter in ID and stated patient did not need TEE because we know where the infection is coming from and ok to order PICC -PICC ordered and pending to be placed and patient will need 2 weeks of IV Abx of Ancef  -PT Evaluated and recommending Home Health; Case Management  Consulted   Toxic Metabolic Encephalopathy, improved -Related to sepsis, continue neuro checks per unit protocol. -Improved significantly   Acute on chronic hypoxic respiratory failure from Severe Pulmonary Fibrosis and ILD -Will continue oxygen supplementation, per nasal cannula, to target oxygen saturation greater than 88%.  -Chest x-ray showed bilateral interstitial infiltrates unchanged from prior films. -C/w Dulera and Incruse -ECHO done and showed Pulmonary arteries: Systolic pressure was moderately to severely increased. PA peak pressure: 56 mm Hg (S). -Continue Supplemental O2 -Will need Pulmonary Follow up and Pulmonary Rehab -Nursing removed HFNC and changed to 6 Liters O2 with Humidity  Parkinson's Disease.  -Continue Sinemet 25-100 mg 2 tab po TIDwm and 50-200 mg Tab po qHS -Physical therapy once patient more stable.  -Continue trazodone, buspirone, gabapentin.  -PT/OT Eval and Treat and recommend Home Health PT/OT  Hypothyroidism  -Continue Levothyroxine 150 mcg po Daily  Hyponatremia, improved -Was Mild at 134 and improved to 137 -Continue to Monitor and repeat CMP in AM   Pre-Diabetes -HbA1c was 6.0 -If Blood Sugars Consistently >200 will need SSI -Blood Sugars on BMP have ranged from 89-126  Neuropathy -C/w Gabapentin 100 mg po BID and 400 mg po qHS  HLD -C/w Atorvastatin 20 mg po qHS   Hypokalemia -Patient's K+ was 3.4 this AM -Replete with Potassium Chloride 40 mEQ po BID -Continue to Monitor and Replete as Necessary -Repeat CMP in AM  DVT prophylaxis: Enoxaparin 40 mg sq q24h Code Status: Partial Code (No CPR) Family Communication: No family present at bedsid Disposition Plan: Anticipate D/C Home within 1-2 Days with IV Abx and Home Health PT once PICC is Placed   Consultants:   Infectious Diseases Dr. Johnnye Sima  Orthopedic Surgery Dr. Lorin Mercy  [facility]OC Nurse    Procedures:  ECHOCARDIOGRAM Study Conclusions  - Left ventricle: The cavity  size was normal. There was mild   concentric hypertrophy. Systolic function was normal. The   estimated ejection fraction was in the range of 60% to 65%. Wall   motion was normal; there were no regional wall motion   abnormalities. Doppler parameters are consistent with abnormal   left ventricular relaxation (grade 1 diastolic dysfunction). - Aortic valve: Transvalvular velocity was within the normal range.   There was no stenosis. There was no regurgitation. - Mitral valve: Transvalvular velocity was within the normal range.   There was no evidence for stenosis. There was trivial   regurgitation. - Left atrium: The atrium was moderately dilated. - Right ventricle: The cavity size was normal. Wall thickness was   normal. Systolic function was normal. - Tricuspid valve: There was mild regurgitation. - Pulmonary arteries: Systolic pressure was moderately to severely   increased. PA peak pressure: 56 mm Hg (S).  ABI ABIs and waveforms are within normal waveforms. Right was 1.08 and Left was 1.2  Antimicrobials:  Anti-infectives    Start     Dose/Rate Route Frequency Ordered Stop   10/20/16 1500  ceFAZolin (ANCEF) IVPB 2g/100 mL premix     2 g 200 mL/hr over 30 Minutes Intravenous Every 8 hours 10/20/16 1422     10/19/16 2200  piperacillin-tazobactam (ZOSYN) IVPB 3.375 g  Status:  Discontinued  3.375 g 12.5 mL/hr over 240 Minutes Intravenous Every 8 hours 10/19/16 1620 10/20/16 1422   10/19/16 2200  vancomycin (VANCOCIN) IVPB 1000 mg/200 mL premix  Status:  Discontinued     1,000 mg 200 mL/hr over 60 Minutes Intravenous Every 12 hours 10/19/16 1620 10/20/16 1422   10/19/16 1445  piperacillin-tazobactam (ZOSYN) IVPB 3.375 g     3.375 g 100 mL/hr over 30 Minutes Intravenous  Once 10/19/16 1442 10/19/16 1556   10/19/16 1445  vancomycin (VANCOCIN) IVPB 1000 mg/200 mL premix     1,000 mg 200 mL/hr over 60 Minutes Intravenous  Once 10/19/16 1442 10/19/16 1615     Subjective: Seen  and examined at bedside was doing well. No complaints. No CP has chronic SOB. No other concerns.   Objective: Vitals:   10/24/16 0451 10/24/16 0731 10/24/16 1249 10/24/16 1328  BP:   93/70   Pulse:  85 79   Resp:  17 (!) 27   Temp: 97.6 F (36.4 C)  98.1 F (36.7 C)   TempSrc: Oral  Oral   SpO2:  100% (!) 88% 96%  Weight: 82.1 kg (181 lb 1.6 oz)     Height:        Intake/Output Summary (Last 24 hours) at 10/24/16 1425 Last data filed at 10/24/16 1250  Gross per 24 hour  Intake             1100 ml  Output             1950 ml  Net             -850 ml   Filed Weights   10/22/16 0400 10/23/16 0429 10/24/16 0451  Weight: 84 kg (185 lb 3 oz) 82.6 kg (182 lb 1.6 oz) 82.1 kg (181 lb 1.6 oz)   Examination: Physical Exam:  Constitutional: Pleasant Elderly Caucasian male in NAD. Appears calm Eyes: Sclerae anicteric; Lids normal ENMT: External ears and nose appear normal. HOH. MMM Neck: Supple with no appreciable JVD Respiratory: Diminished with some rales. No appreciable wheezing/ra/es/rhonchi. Wearing Supplemental O2 via [place]. Cardiovascular: RRR; no m/r/g. No appreciable LE edema Abdomen: Soft, NT, ND. Bowel sounds present GU: Deferred Musculoskeletal: No contractures; No cyanosis Skin: Warm and Dry. Right Toe with Dry necrosis. Has some minimal erythema and very little edema Neurologic: CN 2-12 grossly intact. No tremors noted today. HOH but better with Hearing Aides Psychiatric: Pleasant mood and affect. Intact judgement and insight  Data Reviewed: I have personally reviewed following labs and imaging studies  CBC:  Recent Labs Lab 10/20/16 1419 10/21/16 0639 10/22/16 0228 10/23/16 0233 10/24/16 0315  WBC 5.6 5.4 5.7 6.6 6.2  NEUTROABS 3.9 3.3 3.5 4.1 3.7  HGB 12.0* 12.0* 11.3* 11.3* 11.8*  HCT 36.4* 38.2* 35.7* 35.7* 37.5*  MCV 89.9 89.9 88.8 88.4 88.4  PLT 254 249 236 271 740   Basic Metabolic Panel:  Recent Labs Lab 10/20/16 1419 10/21/16 0639  10/22/16 0228 10/23/16 0233 10/24/16 0315  NA 134* 136 136 136 137  K 3.5 3.7 3.5 3.7 3.4*  CL 102 103 102 101 100*  CO2 '27 27 29 29 30  ' GLUCOSE 126* 88 92 91 89  BUN '11 7 7 7 7  ' CREATININE 0.89 0.72 0.61 0.65 0.67  CALCIUM 8.2* 8.3* 8.1* 8.3* 8.3*  MG 1.7 1.8 1.7 1.7 1.7  PHOS 2.6 2.8 2.5 2.9 3.1   GFR: Estimated Creatinine Clearance: 74.7 mL/min (by C-G formula based on SCr of 0.67 mg/dL). Liver Function Tests:  Recent Labs Lab 10/20/16 1419 10/21/16 0639 10/22/16 0228 10/23/16 0233 10/24/16 0315  AST '27 26 23 23 22  ' ALT 7* 7* <5* <5* 5*  ALKPHOS 75 78 72 73 73  BILITOT 0.8 0.6 0.7 0.4 0.9  PROT 5.6* 6.1* 5.4* 5.6* 5.8*  ALBUMIN 2.4* 2.7* 2.3* 2.4* 2.4*   No results for input(s): LIPASE, AMYLASE in the last 168 hours. No results for input(s): AMMONIA in the last 168 hours. Coagulation Profile:  Recent Labs Lab 10/19/16 1425  INR 0.99   Cardiac Enzymes: No results for input(s): CKTOTAL, CKMB, CKMBINDEX, TROPONINI in the last 168 hours. BNP (last 3 results) No results for input(s): PROBNP in the last 8760 hours. HbA1C: No results for input(s): HGBA1C in the last 72 hours. CBG: No results for input(s): GLUCAP in the last 168 hours. Lipid Profile: No results for input(s): CHOL, HDL, LDLCALC, TRIG, CHOLHDL, LDLDIRECT in the last 72 hours. Thyroid Function Tests: No results for input(s): TSH, T4TOTAL, FREET4, T3FREE, THYROIDAB in the last 72 hours. Anemia Panel: No results for input(s): VITAMINB12, FOLATE, FERRITIN, TIBC, IRON, RETICCTPCT in the last 72 hours. Sepsis Labs:  Recent Labs Lab 10/19/16 1458 10/19/16 1832  LATICACIDVEN 1.81 0.68    Recent Results (from the past 240 hour(s))  Culture, blood (Routine x 2)     Status: Abnormal   Collection Time: 10/19/16  2:30 PM  Result Value Ref Range Status   Specimen Description BLOOD LEFT ARM  Final   Special Requests   Final    BOTTLES DRAWN AEROBIC AND ANAEROBIC Blood Culture adequate volume    Culture  Setup Time   Final    GRAM POSITIVE COCCI IN CLUSTERS IN BOTH AEROBIC AND ANAEROBIC BOTTLES CRITICAL RESULT CALLED TO, READ BACK BY AND VERIFIED WITH: PHARMD A JOHNSTON [id number] MLM    Culture STAPHYLOCOCCUS AUREUS (A)  Final   Report Status 10/22/2016 FINAL  Final   Organism ID, Bacteria STAPHYLOCOCCUS AUREUS  Final      Susceptibility   Staphylococcus aureus - MIC*    CIPROFLOXACIN <=0.5 SENSITIVE Sensitive     ERYTHROMYCIN <=0.25 SENSITIVE Sensitive     GENTAMICIN <=0.5 SENSITIVE Sensitive     OXACILLIN 0.5 SENSITIVE Sensitive     TETRACYCLINE <=1 SENSITIVE Sensitive     VANCOMYCIN <=0.5 SENSITIVE Sensitive     TRIMETH/SULFA <=10 SENSITIVE Sensitive     CLINDAMYCIN <=0.25 SENSITIVE Sensitive     RIFAMPIN <=0.5 SENSITIVE Sensitive     Inducible Clindamycin NEGATIVE Sensitive     * STAPHYLOCOCCUS AUREUS  Blood Culture ID Panel (Reflexed)     Status: Abnormal   Collection Time: 10/19/16  2:30 PM  Result Value Ref Range Status   Enterococcus species NOT DETECTED NOT DETECTED Final   Vancomycin resistance NOT DETECTED NOT DETECTED Final   Listeria monocytogenes NOT DETECTED NOT DETECTED Final   Staphylococcus species DETECTED (A) NOT DETECTED Final    Comment: CRITICAL RESULT CALLED TO, READ BACK BY AND VERIFIED WITH: PHARMD A JOHNSTON [id number] MLM    Staphylococcus aureus DETECTED (A) NOT DETECTED Final    Comment: CRITICAL RESULT CALLED TO, READ BACK BY AND VERIFIED WITH: PHARMD A JOHNSTON [id number] MLM    Methicillin resistance NOT DETECTED NOT DETECTED Final   Streptococcus species NOT DETECTED NOT DETECTED Final   Streptococcus agalactiae NOT DETECTED NOT DETECTED Final   Streptococcus pneumoniae NOT DETECTED NOT DETECTED Final   Streptococcus pyogenes NOT DETECTED NOT DETECTED Final   Acinetobacter baumannii NOT DETECTED NOT  DETECTED Final   Enterobacteriaceae species NOT DETECTED NOT DETECTED Final   Enterobacter cloacae complex NOT DETECTED NOT  DETECTED Final   Escherichia coli NOT DETECTED NOT DETECTED Final   Klebsiella oxytoca NOT DETECTED NOT DETECTED Final   Klebsiella pneumoniae NOT DETECTED NOT DETECTED Final   Proteus species NOT DETECTED NOT DETECTED Final   Serratia marcescens NOT DETECTED NOT DETECTED Final   Carbapenem resistance NOT DETECTED NOT DETECTED Final   Haemophilus influenzae NOT DETECTED NOT DETECTED Final   Neisseria meningitidis NOT DETECTED NOT DETECTED Final   Pseudomonas aeruginosa NOT DETECTED NOT DETECTED Final   Candida albicans NOT DETECTED NOT DETECTED Final   Candida glabrata NOT DETECTED NOT DETECTED Final   Candida krusei NOT DETECTED NOT DETECTED Final   Candida parapsilosis NOT DETECTED NOT DETECTED Final   Candida tropicalis NOT DETECTED NOT DETECTED Final  Culture, blood (Routine x 2)     Status: Abnormal   Collection Time: 10/19/16  2:41 PM  Result Value Ref Range Status   Specimen Description BLOOD RIGHT ARM  Final   Special Requests   Final    BOTTLES DRAWN AEROBIC AND ANAEROBIC Blood Culture adequate volume   Culture  Setup Time   Final    GRAM POSITIVE COCCI IN CLUSTERS IN BOTH AEROBIC AND ANAEROBIC BOTTLES CRITICAL VALUE NOTED.  VALUE IS CONSISTENT WITH PREVIOUSLY REPORTED AND CALLED VALUE.    Culture (A)  Final    STAPHYLOCOCCUS AUREUS SUSCEPTIBILITIES PERFORMED ON PREVIOUS CULTURE WITHIN THE LAST 5 DAYS.    Report Status 10/22/2016 FINAL  Final  MRSA PCR Screening     Status: None   Collection Time: 10/20/16 12:11 AM  Result Value Ref Range Status   MRSA by PCR NEGATIVE NEGATIVE Final    Comment:        The GeneXpert MRSA Assay (FDA approved for NASAL specimens only), is one component of a comprehensive MRSA colonization surveillance program. It is not intended to diagnose MRSA infection nor to guide or monitor treatment for MRSA infections.   Culture, blood (routine x 2)     Status: None (Preliminary result)   Collection Time: 10/21/16  6:45 AM  Result Value  Ref Range Status   Specimen Description BLOOD RIGHT ANTECUBITAL  Final   Special Requests   Final    BOTTLES DRAWN AEROBIC AND ANAEROBIC Blood Culture adequate volume   Culture NO GROWTH 2 DAYS  Final   Report Status PENDING  Incomplete  Culture, blood (routine x 2)     Status: None (Preliminary result)   Collection Time: 10/21/16  6:50 AM  Result Value Ref Range Status   Specimen Description BLOOD RIGHT HAND  Final   Special Requests IN PEDIATRIC BOTTLE Blood Culture adequate volume  Final   Culture NO GROWTH 2 DAYS  Final   Report Status PENDING  Incomplete    Radiology Studies: No results found. Scheduled Meds: . alfuzosin  10 mg Oral Daily  . atorvastatin  20 mg Oral Daily  . busPIRone  5 mg Oral BID  . carbidopa-levodopa  1 tablet Oral QHS  . carbidopa-levodopa  2 tablet Oral TID WC  . clopidogrel  75 mg Oral Daily  . enoxaparin (LOVENOX) injection  40 mg Subcutaneous Q24H  . finasteride  5 mg Oral QPM  . fluticasone  1 spray Each Nare Daily  . gabapentin  100 mg Oral BID   And  . gabapentin  400 mg Oral QHS  . lactose free nutrition  237 mL  Oral TID WC  . levothyroxine  150 mcg Oral QAC breakfast  . mirabegron ER  50 mg Oral QPM  . mometasone-formoterol  2 puff Inhalation BID  . multivitamin  1 tablet Oral Daily  . multivitamin with minerals  1 tablet Oral Q1200  . mupirocin cream   Topical Daily  . mupirocin ointment  1 application Nasal BID  . potassium chloride  40 mEq Oral BID  . sodium chloride flush  3 mL Intravenous Q12H  . sodium chloride flush  3 mL Intravenous Q12H  . traZODone  100 mg Oral QHS  . umeclidinium bromide  1 puff Inhalation Daily   Continuous Infusions: . sodium chloride 250 mL (10/23/16 1543)  .  ceFAZolin (ANCEF) IV Stopped (10/24/16 0657)    LOS: 5 days   Kerney Elbe, DO [facility] Pager [phone]  If 7PM-7AM, please contact night-coverage [email or web address] Password [email or web address] 10/24/2016, 2:25 PM

## 2016-10-24 NOTE — Progress Notes (Signed)
Peripherally Inserted Central Catheter/Midline Placement  The IV Nurse has discussed with the patient and/or persons authorized to consent for the patient, the purpose of this procedure and the potential benefits and risks involved with this procedure.  The benefits include less needle sticks, lab draws from the catheter, and the patient may be discharged home with the catheter. Risks include, but not limited to, infection, bleeding, blood clot (thrombus formation), and puncture of an artery; nerve damage and irregular heartbeat and possibility to perform a PICC exchange if needed/ordered by physician.  Alternatives to this procedure were also discussed.  Bard Power PICC patient education guide, fact sheet on infection prevention and patient information card has been provided to patient /or left at bedside.    PICC/Midline Placement Documentation  PICC Single Lumen 52/77/82 PICC Right Basilic 44 cm 0 cm (Active)  Indication for Insertion or Continuance of Line Home intravenous therapies (PICC only) 10/24/2016  2:00 PM  Exposed Catheter (cm) 0 cm 10/24/2016  2:00 PM  Dressing Change Due 10/31/16 10/24/2016  2:00 PM       Jule Economy Horton 10/24/2016, 2:47 PM

## 2016-10-24 NOTE — Discharge Summary (Signed)
Physician Discharge Summary  Bradley Bennett MRN:[id number] DOB: 11/03/1932 DOA: 10/19/2016  PCP: Burns, Stacy J, MD  Admit date: 10/19/2016 Discharge date: 10/24/2016  Admitted From: Home Disposition:  Home with Home Health  Recommendations for Outpatient Follow-up:  1. Follow up with PCP in 1-2 weeks Follow up with Infectious Diseases within 1-2 weeks 2. Follow up with Orthopedic Surgery Dr. Yates within 1 week 3. Follow up with Pulmonary Dr. McQuaid as an outpatient within 1 week 4. Follow up with Pulmonary Rehab 5. C/w Cefazolin 2 grams q8h with Once Weekly Labs CBC with Differential and ESR and CRP every other week 6. Please obtain CMP/CBC, Mag, Phos in one week 7. Please follow up on the following pending results: Full Blood Cx Results   Home Health: YES  Equipment/Devices: None recommended by PT  Discharge Condition: Stable CODE STATUS: Partial Code  Diet recommendation: Heart Healthy Diet  Brief/Interim Summary: Bradley J Converyis a 83 y.o.malewith medical history significant of interstitial lung disease with significant chronic hypoxic respiratory failure who presents with right foot erythema and altered mentation. Patient was brought by his daughter after finding him to be confused. Apparently one week ago he had a respiratory arrest after running out of his supplemental oxygen, he was found down on a parking lot, required bag valve mask ventilation, brought to the hospital stabilized and discharged home. Since then patient has been disoriented, and confused, this symptoms have been persistent and progressive, no improving or worsening factors, no other associated symptoms. Today he was found by his daughter to be more confused, she noticed significant redness of his right foot and brought him to the hospital.  Unable to obtain further history from patient due to confusion, he reports having the foot redness for 7 days, been nonpainful. Most information was obtained from his  daughter at the bedside. Blood Cx revealed that he had a MSSA Bacteremia so ID was consulted. WOC Nurse evaluated wound and recommended Ortho Consultation. Ortho Consulted and will continue to monitor and if does not improve will proceed with necrotic skin debridement. Discussed Case with ID and patient does not need TEE but does need IV Abx for 2 weeks and ok with Dr. Hatcher to place PICC for continual Abx Therapies. I discussed the case with Dr. Yates of Orthopedics and he recommended follow up within 1 week of Discharge. PICC  placed for IV Abx therapies for home and patient was deemed stable to D/C Home. Will need to follow up with PCP, Pulmonary, Infectious Diseases and Orthopedic Surgery as an outpatient.   Discharge Diagnoses:  Active Problems:   Hyperlipidemia   COPD (chronic obstructive pulmonary disease) (HCC)   Postinflammatory pulmonary fibrosis (HCC)   Parkinson disease (HCC)   Hypothyroidism   Dyspnea   Prediabetes   Cellulitis   Sepsis (HCC)   Bacteremia due to methicillin susceptible Staphylococcus aureus (MSSA)   Hyponatremia   Hypokalemia  Sepsis due to right foot cellulitis and MSSA Bacteremia, improved -Admitted to SDU -Sepsis Physiology is improving -Will D/C IV fluids with isotonic crystalloid solutions at 50 mL per hour -Broad-spectrum antibiotic therapy with IV vancomycin and IV Zosyn narrowed to Ancef. C/w Cefazolin 2 grams q8h -Will keep mean arterial pressure above 65.  -Follow up WBC; was 6.6 -Temperature improved -Right Foot MRI showed Soft tissue ulceration along the medial aspect of the great toe at the level of the first distal phalanx. No evidence of osteomyelitis or drainable fluid collection -Blood Cx Obtained and showed Gram Positive Cocci   in Clusters with Methicillin Sensitive Staph Aureus grown -CRP was 5.7 and ESR was 71 -ID Consulted for further evaluation and narrowed IV Abx to Ancef -WOC evaluated and Recommending Ortho for  Debridement -Consulted Orthopedics Dr. Lorin Mercy and appreciate him evaluating. Dr. Lorin Mercy recommends continuing IV Abx and will continue to follow and if it does not continue to improve with dressing changes will proceed with some necrotic skin debridement; Discussed with Ortho Dr. Lorin Mercy and he stated it would be ok to D/C Home and have patient follow up within 1 week after discharge; Dr. Lorin Mercy states he would want some granulation tissue to appear first prior to Debridement  -Blood Cx 10/21/16 repeated and showed NGTD at 2 days -ID recommended checking TTE (As below)  -ABIs done and showed waveforms are within normal waveforms -Discussed with Dr. Durenda Guthrie in ID and stated patient did not need TEE because we know where the infection is coming from and ok to order PICC -PICC ordered and be placed and patient will need 2 weeks of IV Abx of Ancef   -PT Evaluated and recommending Home Health; Case Management Consulted  -Follow up with PCP and with Infectious Diseases and Orthopedic Surgery within 1 week  Toxic Metabolic Encephalopathy, improved -Related to sepsis, continue neuro checks per unit protocol. -Improved significantly   Acute on chronic hypoxic respiratory failure from Severe Pulmonary Fibrosis and ILD -Will continue oxygen supplementation, per nasal cannula, to target oxygen saturation greater than 88%.  -Chest x-ray showed bilateral interstitial infiltrates unchanged from prior films. -C/w Dulera and Incruse -ECHO done and showed Pulmonary arteries: Systolic pressure was moderately to severely increased. PA peak pressure: 56 mm Hg (S). -Continue Supplemental O2 -Will need Pulmonary Follow up and Pulmonary Rehab -Nursing removed HFNC and changed to 6 Liters O2 with Humidity -Follow up with Pulmonary Dr. Lake Bells as an outpatient   Parkinson's Disease.  -Continue Sinemet 25-100 mg 2 tab po TIDwm and 50-200 mg Tab po qHS -Physical therapy once patient more stable.  -Continue trazodone,  buspirone, gabapentin.  -PT/OT Eval and Treat and recommend Home Health PT/OT and will D/C Home with Home Health today  Hypothyroidism  -Continue Levothyroxine 150 mcg po Daily  Hyponatremia, improved -Was Mild at 134 and improved to 137 -Continue to Monitor and repeat CMP as an outpatient    Pre-Diabetes -HbA1c was 6.0 -If Blood Sugars Consistently >200 will need SSI -Blood Sugars on BMP have ranged from 89-126 -Follow up with PCP   Neuropathy -C/w Gabapentin 100 mg po BID and 400 mg po qHS  HLD -C/w Atorvastatin 20 mg po qHS   Hypokalemia -Patient's K+ was 3.4 this AM -Replete with Potassium Chloride 40 mEQ po BID -Continue to Monitor and Replete as Necessary -Repeat CMP as an outpatient   Discharge Instructions  Discharge Instructions    AMB referral to pulmonary rehabilitation    Complete by:  As directed    Please select a program:  Pulmonary Rehabilitation (COPD)   COPD Diagnosis: (See requirements below):  COPD-Gold 4   Program Prescription:  O2 Administration by RT, EP, or RN if SpO2<88%   Call MD for:  difficulty breathing, headache or visual disturbances    Complete by:  As directed    Call MD for:  extreme fatigue    Complete by:  As directed    Call MD for:  hives    Complete by:  As directed    Call MD for:  persistant dizziness or light-headedness    Complete by:  As directed    Call MD for:  persistant nausea and vomiting    Complete by:  As directed    Call MD for:  redness, tenderness, or signs of infection (pain, swelling, redness, odor or green/yellow discharge around incision site)    Complete by:  As directed    Call MD for:  severe uncontrolled pain    Complete by:  As directed    Call MD for:  temperature >100.4    Complete by:  As directed    Diet - low sodium heart healthy    Complete by:  As directed    Home infusion instructions [facility] May follow [facility] Dosing Protocol; May administer Cathflo as needed to  maintain patency of vascular access device.; Flushing of vascular access device: per [facility] Protocol: 0.9% NaCl pre/post medica...    Complete by:  As directed    Instructions:  May follow [facility] Dosing Protocol   Instructions:  May administer Cathflo as needed to maintain patency of vascular access device.   Instructions:  Flushing of vascular access device: per [facility] Protocol: 0.9% NaCl pre/post medication administration and prn patency; Heparin 100 u/ml, 5ml for implanted ports and Heparin 10u/ml, 5ml for all other central venous catheters.   Instructions:  May follow [facility] Anaphylaxis Protocol for First Dose Administration in the home: 0.9% NaCl at 25-50 ml/hr to maintain IV access for protocol meds. Epinephrine 0.3 ml IV/IM PRN and Benadryl 25-50 IV/IM PRN s/s of anaphylaxis.   Instructions:  [facility] Infusion Coordinator (RN) to assist per patient IV care needs in the home PRN.   Increase activity slowly    Complete by:  As directed      Allergies as of 10/24/2016      Reactions   Isosorbide Other (See Comments)   Reaction:  Headaches       Medication List    STOP taking these medications   tamsulosin 0.4 MG Caps capsule Commonly known as:  FLOMAX     TAKE these medications   acetaminophen 500 MG tablet Commonly known as:  TYLENOL Take 500-1,000 mg by mouth every 6 (six) hours as needed for mild pain, moderate pain, fever or headache.   alfuzosin 10 MG 24 hr tablet Commonly known as:  UROXATRAL Take 10 mg by mouth daily.   atorvastatin 20 MG tablet Commonly known as:  LIPITOR Take 1 tablet (20 mg total) by mouth daily.   busPIRone 5 MG tablet Commonly known as:  BUSPAR Take 1 tablet (5 mg total) by mouth 2 (two) times daily.   carbidopa-levodopa 25-100 MG tablet Commonly known as:  SINEMET IR TAKE TWO TABLETS BY MOUTH THREE TIMES A DAY   carbidopa-levodopa 50-200 MG tablet Commonly known as:  SINEMET CR TAKE ONE TABLET BY MOUTH AT BEDTIME   ceFAZolin  IVPB Commonly known as:  ANCEF Inject 2 g into the vein every 8 (eight) hours. Indication: MSSA bacteremia Last Day of Therapy:  11/03/16 Labs - Once weekly:  CBC/D and BMP, Labs - Every other week:  ESR and CRP   clopidogrel 75 MG tablet Commonly known as:  PLAVIX TAKE ONE TABLET BY MOUTH DAILY What changed:  See the new instructions.   finasteride 5 MG tablet Commonly known as:  PROSCAR Take 1 tablet (5 mg total) by mouth every evening.   fluticasone 50 MCG/ACT nasal spray Commonly known as:  FLONASE Place 1 spray into both nostrils daily.   Fluticasone-Salmeterol 500-50 MCG/DOSE Aepb Commonly   known as:  ADVAIR DISKUS Inhale 1 puff into the lungs 2 (two) times daily.   gabapentin 100 MG capsule Commonly known as:  NEURONTIN Take 2 capsules (200 mg total) by mouth at bedtime. What changed:  how much to take  when to take this  additional instructions   gabapentin 400 MG capsule Commonly known as:  NEURONTIN Take 1 capsule (400 mg total) by mouth at bedtime. What changed:  Another medication with the same name was changed. Make sure you understand how and when to take each.   ICAPS AREDS 2 PO Take 1-2 tablets by mouth See admin instructions. 2 tablets @ noon and 1 tablet _0    lactose free nutrition Liqd Take 237 mLs by mouth 3 (three) times daily with meals.   levothyroxine 150 MCG tablet Commonly known as:  SYNTHROID, LEVOTHROID TAKE ONE TABLET BY MOUTH DAILY What changed:  See the new instructions.   multivitamin with minerals Tabs tablet Take 1 tablet by mouth daily at 12 noon.   mupirocin ointment 2 % Commonly known as:  BACTROBAN Place 1 application into the nose 2 (two) times daily.   MYRBETRIQ 50 MG Tb24 tablet Generic drug:  mirabegron ER Take 50 mg by mouth every evening.   sodium chloride 0.65 % Soln nasal spray Commonly known as:  OCEAN Place 1 spray into both nostrils as needed for congestion.   traZODone 100 MG tablet Commonly known as:   DESYREL Take 1 tablet (100 mg total) by mouth at bedtime.   umeclidinium bromide 62.5 MCG/INH Aepb Commonly known as:  INCRUSE ELLIPTA Inhale 1 puff into the lungs daily.            Home Infusion Instuctions        Start     Ordered   10/24/16 0000  Home infusion instructions [facility] May follow [facility] Dosing Protocol; May administer Cathflo as needed to maintain patency of vascular access device.; Flushing of vascular access device: per [facility] Protocol: 0.9% NaCl pre/post medica...    Question Answer Comment  Instructions May follow [facility] Dosing Protocol   Instructions May administer Cathflo as needed to maintain patency of vascular access device.   Instructions Flushing of vascular access device: per [facility] Protocol: 0.9% NaCl pre/post medication administration and prn patency; Heparin 100 u/ml, 62m for implanted ports and Heparin 10u/ml, 546mfor all other central venous catheters.   Instructions May follow [facility] Anaphylaxis Protocol for First Dose Administration in the home: 0.9% NaCl at 25-50 ml/hr to maintain IV access for protocol meds. Epinephrine 0.3 ml IV/IM PRN and Benadryl 25-50 IV/IM PRN s/s of anaphylaxis.   Instructions [facility] Infusion Coordinator (RN) to assist per patient IV care needs in the home PRN.      10/24/16 1620       Discharge Care Instructions        Start     Ordered   10/24/16 0000  Home infusion instructions [facility]; May administer Cathflo as needed to maintain patency of vascular access device.; [facility]: 0.9% NaCl pre/post medica...    Question Answer Comment  Instructions [facility] follow [facility]   Instructions May administer Cathflo as needed to maintain patency of vascular access device.   Instructions Flushing of vascular access device: [facility]: 0.9% NaCl pre/post medication administration and prn  patency; Heparin 100 u/ml, 48m71mor implanted ports and Heparin  10u/ml, 5ml for all other central venous catheters.   Instructions May follow [facility] Anaphylaxis Protocol for First Dose Administration in the home: 0.9% NaCl at 25-50 ml/hr to maintain IV access for protocol meds. Epinephrine 0.3 ml IV/IM PRN and Benadryl 25-50 IV/IM PRN s/s of anaphylaxis.   Instructions [facility] Infusion Coordinator (RN) to assist per patient IV care needs in the home PRN.      10/24/16 1620   10/24/16 0000  ceFAZolin (ANCEF) IVPB  Every 8 hours     10/24/16 1620   10/24/16 0000  lactose free nutrition (BOOST PLUS) LIQD  3 times daily with meals     10/24/16 1658   10/24/16 0000  Increase activity slowly     10/24/16 1658   10/24/16 0000  Diet - low sodium heart healthy     10/24/16 1658   10/24/16 0000  Call MD for:  temperature >100.4     10/24/16 1658   10/24/16 0000  Call MD for:  persistant nausea and vomiting     10/24/16 1658   10/24/16 0000  Call MD for:  severe uncontrolled pain     10/24/16 1658   10/24/16 0000  Call MD for:  redness, tenderness, or signs of infection (pain, swelling, redness, odor or green/yellow discharge around incision site)     10/24/16 1658   10/24/16 0000  Call MD for:  difficulty breathing, headache or visual disturbances     10/24/16 1658   10/24/16 0000  Call MD for:  persistant dizziness or light-headedness     10/24/16 1658   10/24/16 0000  Call MD for:  extreme fatigue     10/24/16 1658   10/24/16 0000  Call MD for:  hives     10/24/16 1658   10/24/16 0000  AMB referral to pulmonary rehabilitation    Question Answer Comment  Please select a program: Pulmonary Rehabilitation (COPD)   COPD Diagnosis: (See requirements below) COPD-Gold 4   Program Prescription: O2 Administration by RT, EP, or RN if SpO2<88%      10/24/16 1658     Follow-up Information    [facility]-Home Follow up.   Why:  HH arranged for home IV abx needs-  RN/PT Contact information: [address] [place] [address] [phone]        Burns, Stacy J, MD. Call.   Specialty:  Internal Medicine Why:  Follow up within 1 week Contact information: [address] [place] [address] [phone]        Hatcher, Jeffrey C, MD. Call in 1 week(s).   Specialty:  Infectious Diseases Why:  Call to schedule within 1 week Contact information: [address] [place] [address] [phone]        Yates, Mark C, MD Follow up.   Specialty:  Orthopedic Surgery Why:  Follow up within 1 week Contact information: [address] [place] [address] [phone]        McQuaid, Douglas B, MD. Call.   Specialty:  Pulmonary Disease Why:  Call to follow up within 1 week.  Contact information: [address] [place] [address] [phone]          Allergies  Allergen Reactions  . Isosorbide Other (See Comments)    Reaction:  Headaches     Consultations:  Infectious Diseases  Orthopedic Surgery  Procedures/Studies: Dg Chest 2 View  Result Date: 10/19/2016 CLINICAL DATA:  Shortness of breath. History of emphysema   and hypertension. EXAM: CHEST  2 VIEW COMPARISON:  Chest radiograph October 10, 2016 FINDINGS: Cardiac silhouette is similarly enlarged. Calcified aortic knob. Status post median sternotomy for CABG with coronary artery stent. Diffuse interstitial prominence with RIGHT greater LEFT apical bullous changes. Flattened hemidiaphragms. Similar scarring RIGHT mid lung zone. No pleural effusion or focal consolidation. No pneumothorax. Soft tissue planes and included osseous structures are unchanged. IMPRESSION: Stable interstitial prominence concerning for chronic interstitial lung disease superimposed on COPD. Stable cardiomegaly, status post CABG and coronary artery stenting. Aortic Atherosclerosis (ICD10-I70.0). Electronically Signed   By: Courtnay  Bloomer M.D.   On: 10/19/2016 15:45   Mr  Foot Right W Wo Contrast  Result Date: 10/20/2016 CLINICAL DATA:  Left great toe ulcer.  Evaluate for osteomyelitis. EXAM: MRI OF THE RIGHT FOREFOOT WITHOUT AND WITH CONTRAST TECHNIQUE: Multiplanar, multisequence MR imaging of the right forefoot was performed before and after the administration of intravenous contrast. CONTRAST:  18mL MULTIHANCE GADOBENATE DIMEGLUMINE 529 MG/ML IV SOLN COMPARISON:  Right foot x-rays from yesterday. FINDINGS: Bones/Joint/Cartilage No suspicious marrow signal abnormality. Mild degenerative changes of the first MTP and IP joints. No fracture or dislocation. Ligaments The Lisfranc ligament is intact. The collateral ligaments are intact. Muscles and Tendons Severe fatty atrophy of the intrinsic muscles of the forefoot. The visualized flexor and extensor tendons are intact. Soft tissues Soft tissue ulceration along the medial aspect of the first distal phalanx. Mild surrounding soft tissue enhancement. No drainable fluid collection IMPRESSION: 1. Soft tissue ulceration along the medial aspect of the great toe at the level of the first distal phalanx. No evidence of osteomyelitis or drainable fluid collection. Electronically Signed   By: William T Derry M.D.   On: 10/20/2016 12:21   Dg Chest Port 1 View  Result Date: 10/10/2016 CLINICAL DATA:  Shortness of breath. EXAM: PORTABLE CHEST 1 VIEW COMPARISON:  Radiographs Jun 15, 2016. FINDINGS: Stable cardiomediastinal silhouette. Sternotomy wires are noted. Emphysematous disease is noted in the upper lobes bilaterally, with diffuse interstitial densities in both lungs which may be more prominent compared to prior exam. This is most consistent with underlying chronic interstitial lung disease or fibrosis, with possible superimposed edema or inflammation. No pneumothorax or pleural effusion is noted. Bony thorax is unremarkable. IMPRESSION: Stable bilateral upper lobe emphysematous disease is noted. Mildly increased diffuse interstitial  densities are noted in both lower lobes concerning for chronic interstitial lung disease or pulmonary fibrosis with possible superimposed acute edema or inflammation. Electronically Signed   By: James  Green Jr, M.D.   On: 10/10/2016 14:18   Dg Foot Complete Right  Result Date: 10/19/2016 CLINICAL DATA:  Right great toe infection. Evaluate for gas in soft tissue. EXAM: RIGHT FOOT COMPLETE - 3+ VIEW COMPARISON:  None. FINDINGS: Negative for gas or foreign body in the great toe, or elsewhere. No visible ulcer or osteomyelitis. No acute fracture. Mild first metatarsal head irregularity that is likely degenerative. IMPRESSION: Negative for soft tissue gas, opaque foreign body, or acute osseous finding. Electronically Signed   By: Jonathon  Watts M.D.   On: 10/19/2016 15:42   ECHOCARDIOGRAM Study Conclusions  - Left ventricle: The cavity size was normal. There was mild concentric hypertrophy. Systolic function was normal. The estimated ejection fraction was in the range of 60% to 65%. Wall motion was normal; there were no regional wall motion abnormalities. Doppler parameters are consistent with abnormal left ventricular relaxation (grade 1 diastolic dysfunction). - Aortic valve: Transvalvular velocity was within the normal range. There was no   stenosis. There was no regurgitation. - Mitral valve: Transvalvular velocity was within the normal range. There was no evidence for stenosis. There was trivial regurgitation. - Left atrium: The atrium was moderately dilated. - Right ventricle: The cavity size was normal. Wall thickness was normal. Systolic function was normal. - Tricuspid valve: There was mild regurgitation. - Pulmonary arteries: Systolic pressure was moderately to severely increased. PA peak pressure: 56 mm Hg (S).  ABI ABIs and waveforms are within normal waveforms. Right was 1.08 and Left was 1.2  Subjective: Seen and examined at bedside was doing well. No  complaints. No CP has chronic SOB. No other concerns. Ready to go home.  Discharge Exam: Vitals:   10/24/16 1249 10/24/16 1328  BP: 93/70   Pulse: 79   Resp: (!) 27   Temp: 98.1 F (36.7 C)   SpO2: (!) 88% 96%   Vitals:   10/24/16 0451 10/24/16 0731 10/24/16 1249 10/24/16 1328  BP:   93/70   Pulse:  85 79   Resp:  17 (!) 27   Temp: 97.6 F (36.4 C)  98.1 F (36.7 C)   TempSrc: Oral  Oral   SpO2:  100% (!) 88% 96%  Weight: 82.1 kg (181 lb 1.6 oz)     Height:       General: Pt is alert, awake, not in acute distress Cardiovascular: RRR, S1/S2 +, no rubs, no gallops Respiratory: Diminished bilaterally, no wheezing, no rhonchi; Wearing Supplemental O2  Abdominal: Soft, NT, ND, bowel sounds + Extremities: no edema, no cyanosis  The results of significant diagnostics from this hospitalization (including imaging, microbiology, ancillary and laboratory) are listed below for reference.    Microbiology: Recent Results (from the past 240 hour(s))  Culture, blood (Routine x 2)     Status: Abnormal   Collection Time: 10/19/16  2:30 PM  Result Value Ref Range Status   Specimen Description BLOOD LEFT ARM  Final   Special Requests   Final    BOTTLES DRAWN AEROBIC AND ANAEROBIC Blood Culture adequate volume   Culture  Setup Time   Final    GRAM POSITIVE COCCI IN CLUSTERS IN BOTH AEROBIC AND ANAEROBIC BOTTLES CRITICAL RESULT CALLED TO, READ BACK BY AND VERIFIED WITH: PHARMD A JOHNSTON [id number] MLM    Culture STAPHYLOCOCCUS AUREUS (A)  Final   Report Status 10/22/2016 FINAL  Final   Organism ID, Bacteria STAPHYLOCOCCUS AUREUS  Final      Susceptibility   Staphylococcus aureus - MIC*    CIPROFLOXACIN <=0.5 SENSITIVE Sensitive     ERYTHROMYCIN <=0.25 SENSITIVE Sensitive     GENTAMICIN <=0.5 SENSITIVE Sensitive     OXACILLIN 0.5 SENSITIVE Sensitive     TETRACYCLINE <=1 SENSITIVE Sensitive     VANCOMYCIN <=0.5 SENSITIVE Sensitive     TRIMETH/SULFA <=10 SENSITIVE Sensitive      CLINDAMYCIN <=0.25 SENSITIVE Sensitive     RIFAMPIN <=0.5 SENSITIVE Sensitive     Inducible Clindamycin NEGATIVE Sensitive     * STAPHYLOCOCCUS AUREUS  Blood Culture ID Panel (Reflexed)     Status: Abnormal   Collection Time: 10/19/16  2:30 PM  Result Value Ref Range Status   Enterococcus species NOT DETECTED NOT DETECTED Final   Vancomycin resistance NOT DETECTED NOT DETECTED Final   Listeria monocytogenes NOT DETECTED NOT DETECTED Final   Staphylococcus species DETECTED (A) NOT DETECTED Final    Comment: CRITICAL RESULT CALLED TO, READ BACK BY AND VERIFIED WITH: PHARMD A JOHNSTON [id number] MLM    Staphylococcus  aureus DETECTED (A) NOT DETECTED Final    Comment: CRITICAL RESULT CALLED TO, READ BACK BY AND VERIFIED WITH: PHARMD A JOHNSTON [id number] MLM    Methicillin resistance NOT DETECTED NOT DETECTED Final   Streptococcus species NOT DETECTED NOT DETECTED Final   Streptococcus agalactiae NOT DETECTED NOT DETECTED Final   Streptococcus pneumoniae NOT DETECTED NOT DETECTED Final   Streptococcus pyogenes NOT DETECTED NOT DETECTED Final   Acinetobacter baumannii NOT DETECTED NOT DETECTED Final   Enterobacteriaceae species NOT DETECTED NOT DETECTED Final   Enterobacter cloacae complex NOT DETECTED NOT DETECTED Final   Escherichia coli NOT DETECTED NOT DETECTED Final   Klebsiella oxytoca NOT DETECTED NOT DETECTED Final   Klebsiella pneumoniae NOT DETECTED NOT DETECTED Final   Proteus species NOT DETECTED NOT DETECTED Final   Serratia marcescens NOT DETECTED NOT DETECTED Final   Carbapenem resistance NOT DETECTED NOT DETECTED Final   Haemophilus influenzae NOT DETECTED NOT DETECTED Final   Neisseria meningitidis NOT DETECTED NOT DETECTED Final   Pseudomonas aeruginosa NOT DETECTED NOT DETECTED Final   Candida albicans NOT DETECTED NOT DETECTED Final   Candida glabrata NOT DETECTED NOT DETECTED Final   Candida krusei NOT DETECTED NOT DETECTED Final   Candida parapsilosis NOT  DETECTED NOT DETECTED Final   Candida tropicalis NOT DETECTED NOT DETECTED Final  Culture, blood (Routine x 2)     Status: Abnormal   Collection Time: 10/19/16  2:41 PM  Result Value Ref Range Status   Specimen Description BLOOD RIGHT ARM  Final   Special Requests   Final    BOTTLES DRAWN AEROBIC AND ANAEROBIC Blood Culture adequate volume   Culture  Setup Time   Final    GRAM POSITIVE COCCI IN CLUSTERS IN BOTH AEROBIC AND ANAEROBIC BOTTLES CRITICAL VALUE NOTED.  VALUE IS CONSISTENT WITH PREVIOUSLY REPORTED AND CALLED VALUE.    Culture (A)  Final    STAPHYLOCOCCUS AUREUS SUSCEPTIBILITIES PERFORMED ON PREVIOUS CULTURE WITHIN THE LAST 5 DAYS.    Report Status 10/22/2016 FINAL  Final  MRSA PCR Screening     Status: None   Collection Time: 10/20/16 12:11 AM  Result Value Ref Range Status   MRSA by PCR NEGATIVE NEGATIVE Final    Comment:        The GeneXpert MRSA Assay (FDA approved for NASAL specimens only), is one component of a comprehensive MRSA colonization surveillance program. It is not intended to diagnose MRSA infection nor to guide or monitor treatment for MRSA infections.   Culture, blood (routine x 2)     Status: None (Preliminary result)   Collection Time: 10/21/16  6:45 AM  Result Value Ref Range Status   Specimen Description BLOOD RIGHT ANTECUBITAL  Final   Special Requests   Final    BOTTLES DRAWN AEROBIC AND ANAEROBIC Blood Culture adequate volume   Culture NO GROWTH 3 DAYS  Final   Report Status PENDING  Incomplete  Culture, blood (routine x 2)     Status: None (Preliminary result)   Collection Time: 10/21/16  6:50 AM  Result Value Ref Range Status   Specimen Description BLOOD RIGHT HAND  Final   Special Requests IN PEDIATRIC BOTTLE Blood Culture adequate volume  Final   Culture NO GROWTH 3 DAYS  Final   Report Status PENDING  Incomplete    Labs: BNP (last 3 results)  Recent Labs  05/10/16 1405  BNP 235.5*   Basic Metabolic Panel:  Recent  Labs Lab 10/20/16 1419 10/21/16 0639 10/22/16 0228 10/23/16  0233 10/24/16 0315  NA 134* 136 136 136 137  K 3.5 3.7 3.5 3.7 3.4*  CL 102 103 102 101 100*  CO2 _0 GLUCOSE 126* 88 92 91 89  BUN _1 CREATININE 0.89 0.72 0.61 0.65 0.67  CALCIUM 8.2* 8.3* 8.1* 8.3* 8.3*  MG 1.7 1.8 1.7 1.7 1.7  PHOS 2.6 2.8 2.5 2.9 3.1   Liver Function Tests:  Recent Labs Lab 10/20/16 1419 10/21/16 0639 10/22/16 0228 10/23/16 0233 10/24/16 0315  AST _2 ALT 7* 7* <5* <5* 5*  ALKPHOS 75 78 72 73 73  BILITOT 0.8 0.6 0.7 0.4 0.9  PROT 5.6* 6.1* 5.4* 5.6* 5.8*  ALBUMIN 2.4* 2.7* 2.3* 2.4* 2.4*   No results for input(s): LIPASE, AMYLASE in the last 168 hours. No results for input(s): AMMONIA in the last 168 hours. CBC:  Recent Labs Lab 10/20/16 1419 10/21/16 0639 10/22/16 0228 10/23/16 0233 10/24/16 0315  WBC 5.6 5.4 5.7 6.6 6.2  NEUTROABS 3.9 3.3 3.5 4.1 3.7  HGB 12.0* 12.0* 11.3* 11.3* 11.8*  HCT 36.4* 38.2* 35.7* 35.7* 37.5*  MCV 89.9 89.9 88.8 88.4 88.4  PLT 254 249 236 271 282   Cardiac Enzymes: No results for input(s): CKTOTAL, CKMB, CKMBINDEX, TROPONINI in the last 168 hours. BNP: Invalid input(s): POCBNP CBG: No results for input(s): GLUCAP in the last 168 hours. D-Dimer No results for input(s): DDIMER in the last 72 hours. Hgb A1c No results for input(s): HGBA1C in the last 72 hours. Lipid Profile No results for input(s): CHOL, HDL, LDLCALC, TRIG, CHOLHDL, LDLDIRECT in the last 72 hours. Thyroid function studies No results for input(s): TSH, T4TOTAL, T3FREE, THYROIDAB in the last 72 hours.  Invalid input(s): FREET3 Anemia work up No results for input(s): VITAMINB12, FOLATE, FERRITIN, TIBC, IRON, RETICCTPCT in the last 72 hours. Urinalysis    Component Value Date/Time   COLORURINE YELLOW 10/19/2016 1600   APPEARANCEUR CLEAR 10/19/2016 1600   LABSPEC 1.019 10/19/2016 1600   PHURINE 5.0 10/19/2016 1600   GLUCOSEU NEGATIVE  10/19/2016 1600   HGBUR NEGATIVE 10/19/2016 1600   BILIRUBINUR NEGATIVE 10/19/2016 1600   KETONESUR 5 (A) 10/19/2016 1600   PROTEINUR 30 (A) 10/19/2016 1600   NITRITE NEGATIVE 10/19/2016 1600   LEUKOCYTESUR NEGATIVE 10/19/2016 1600   Sepsis Labs Invalid input(s): PROCALCITONIN,  WBC,  LACTICIDVEN Microbiology Recent Results (from the past 240 hour(s))  Culture, blood (Routine x 2)     Status: Abnormal   Collection Time: 10/19/16  2:30 PM  Result Value Ref Range Status   Specimen Description BLOOD LEFT ARM  Final   Special Requests   Final    BOTTLES DRAWN AEROBIC AND ANAEROBIC Blood Culture adequate volume   Culture  Setup Time   Final    GRAM POSITIVE COCCI IN CLUSTERS IN BOTH AEROBIC AND ANAEROBIC BOTTLES CRITICAL RESULT CALLED TO, READ BACK BY AND VERIFIED WITH: PHARMD A JOHNSTON [id number] MLM    Culture STAPHYLOCOCCUS AUREUS (A)  Final   Report Status 10/22/2016 FINAL  Final   Organism ID, Bacteria STAPHYLOCOCCUS AUREUS  Final      Susceptibility   Staphylococcus aureus - MIC*    CIPROFLOXACIN <=0.5 SENSITIVE Sensitive     ERYTHROMYCIN <=0.25 SENSITIVE Sensitive     GENTAMICIN <=0.5 SENSITIVE Sensitive     OXACILLIN 0.5 SENSITIVE Sensitive     TETRACYCLINE <=1 SENSITIVE Sensitive     VANCOMYCIN <=0.5 SENSITIVE Sensitive  TRIMETH/SULFA <=10 SENSITIVE Sensitive     CLINDAMYCIN <=0.25 SENSITIVE Sensitive     RIFAMPIN <=0.5 SENSITIVE Sensitive     Inducible Clindamycin NEGATIVE Sensitive     * STAPHYLOCOCCUS AUREUS  Blood Culture ID Panel (Reflexed)     Status: Abnormal   Collection Time: 10/19/16  2:30 PM  Result Value Ref Range Status   Enterococcus species NOT DETECTED NOT DETECTED Final   Vancomycin resistance NOT DETECTED NOT DETECTED Final   Listeria monocytogenes NOT DETECTED NOT DETECTED Final   Staphylococcus species DETECTED (A) NOT DETECTED Final    Comment: CRITICAL RESULT CALLED TO, READ BACK BY AND VERIFIED WITH: PHARMD A JOHNSTON [id number] MLM     Staphylococcus aureus DETECTED (A) NOT DETECTED Final    Comment: CRITICAL RESULT CALLED TO, READ BACK BY AND VERIFIED WITH: PHARMD A JOHNSTON [id number] MLM    Methicillin resistance NOT DETECTED NOT DETECTED Final   Streptococcus species NOT DETECTED NOT DETECTED Final   Streptococcus agalactiae NOT DETECTED NOT DETECTED Final   Streptococcus pneumoniae NOT DETECTED NOT DETECTED Final   Streptococcus pyogenes NOT DETECTED NOT DETECTED Final   Acinetobacter baumannii NOT DETECTED NOT DETECTED Final   Enterobacteriaceae species NOT DETECTED NOT DETECTED Final   Enterobacter cloacae complex NOT DETECTED NOT DETECTED Final   Escherichia coli NOT DETECTED NOT DETECTED Final   Klebsiella oxytoca NOT DETECTED NOT DETECTED Final   Klebsiella pneumoniae NOT DETECTED NOT DETECTED Final   Proteus species NOT DETECTED NOT DETECTED Final   Serratia marcescens NOT DETECTED NOT DETECTED Final   Carbapenem resistance NOT DETECTED NOT DETECTED Final   Haemophilus influenzae NOT DETECTED NOT DETECTED Final   Neisseria meningitidis NOT DETECTED NOT DETECTED Final   Pseudomonas aeruginosa NOT DETECTED NOT DETECTED Final   Candida albicans NOT DETECTED NOT DETECTED Final   Candida glabrata NOT DETECTED NOT DETECTED Final   Candida krusei NOT DETECTED NOT DETECTED Final   Candida parapsilosis NOT DETECTED NOT DETECTED Final   Candida tropicalis NOT DETECTED NOT DETECTED Final  Culture, blood (Routine x 2)     Status: Abnormal   Collection Time: 10/19/16  2:41 PM  Result Value Ref Range Status   Specimen Description BLOOD RIGHT ARM  Final   Special Requests   Final    BOTTLES DRAWN AEROBIC AND ANAEROBIC Blood Culture adequate volume   Culture  Setup Time   Final    GRAM POSITIVE COCCI IN CLUSTERS IN BOTH AEROBIC AND ANAEROBIC BOTTLES CRITICAL VALUE NOTED.  VALUE IS CONSISTENT WITH PREVIOUSLY REPORTED AND CALLED VALUE.    Culture (A)  Final    STAPHYLOCOCCUS AUREUS SUSCEPTIBILITIES PERFORMED ON  PREVIOUS CULTURE WITHIN THE LAST 5 DAYS.    Report Status 10/22/2016 FINAL  Final  MRSA PCR Screening     Status: None   Collection Time: 10/20/16 12:11 AM  Result Value Ref Range Status   MRSA by PCR NEGATIVE NEGATIVE Final    Comment:        The GeneXpert MRSA Assay (FDA approved for NASAL specimens only), is one component of a comprehensive MRSA colonization surveillance program. It is not intended to diagnose MRSA infection nor to guide or monitor treatment for MRSA infections.   Culture, blood (routine x 2)     Status: None (Preliminary result)   Collection Time: 10/21/16  6:45 AM  Result Value Ref Range Status   Specimen Description BLOOD RIGHT ANTECUBITAL  Final   Special Requests   Final    BOTTLES DRAWN AEROBIC   AND ANAEROBIC Blood Culture adequate volume   Culture NO GROWTH 3 DAYS  Final   Report Status PENDING  Incomplete  Culture, blood (routine x 2)     Status: None (Preliminary result)   Collection Time: 10/21/16  6:50 AM  Result Value Ref Range Status   Specimen Description BLOOD RIGHT HAND  Final   Special Requests IN PEDIATRIC BOTTLE Blood Culture adequate volume  Final   Culture NO GROWTH 3 DAYS  Final   Report Status PENDING  Incomplete   Time coordinating discharge: 35 minutes  SIGNED:  Kerney Elbe, DO [facility] 10/24/2016, 4:59 PM Pager [phone]  If 7PM-7AM, please contact night-coverage [email or web address] Password [email or web address]

## 2016-10-24 NOTE — Progress Notes (Signed)
[name] following pt during this admission with ID team to support IV ABX at DC as ordered for home.  If patient discharges after hours, please call [phone].   Bradley Bennett 10/24/2016, 8:19 AM

## 2016-10-24 NOTE — Progress Notes (Signed)
Discussed with the patient and all questioned fully answered. He will call me if any problems arise.  Discharge education completed with patient and his daughter Cecille Rubin. Verbalized understanding of dressing changes, given extra dressing supplies to last the week until he sees the orthopedist.   IV removed. PICC line remains in for discharge. Telemetry removed, CCMD notified.   Daughter brought home O2. Walked with patient and daughter to the car.   Fritz Pickerel, RN

## 2016-10-24 NOTE — Care Management Note (Addendum)
Case Management Note Original Note Created Laurena Slimmer, RN 10/19/2016, 10:51 PM   Patient Details  Name: Bradley Bennett MRN: [id number] Date of Birth: 1932/06/29  Subjective/Objective:       Presented to [facility] ED with cellulitis of great right toe, SOB             Action/Plan: CM met with patient and daughter Kristine Royal [phone] to discuss care transition planning. Daughter reports patient lives at [facility] living, patient is on home oxygen supplied by the [place] in [place]. [name] is his PCP last visit 10/12/16. Utilizes the [facility] on [address]. Discussed recommendations for [facility] services patient and daughter are agreeable, [name] is the [facility] service contracted at [employer].  Unit CM will follow up with patient and family to complete the  transitional plan.  Expected Discharge Date:                  Expected Discharge Plan:  [place]  In-House Referral:     Discharge planning Services  CM Consult  Post Acute Care Choice:  Home Health Choice offered to:  Patient, Adult Children  DME Arranged:    DME Agency:     HH Arranged:  RN, OT, PT, IV Antibiotics HH Agency:  [facility] (Patient is a resident at [facility] with [employer])  Status of Service:  Completed, signed off  If discussed at [employer], dates discussed:    Discharge Disposition:  Home/home health   Additional Comments:  10/24/16- 1100- Dewane Timson RN, CM- referral for home IV abx needs- pt will need 2 wks abx at discharge - awaiting PICC line placement- spoke with pt at bedside- per conversation pt states he lives alone- has girlfriend that can assist that lives at [facility] and also a daughter that lives close by. Pt states that [place] has an in house therapy Contractor- explained that he would need to select agency to assist with his IV abx for nursing- choice  offered to pt- pt selected [facility] services for his IV abx needs and therapy - he can always change over once he is done with his iv avx to the in house therapy if therapy is still needed and he wants to use [facility] at that time. Referral for [facility] needs given to Cecille Rubin and Pam with [facility] - Pt will need [place] orders for HHRN/PT prior to discharge. Pt reports that he has home 02 via the [place] that includes home concentrator, portable concentrator and liquid oxygen- he usually wears 6L at rest and 15L with activity. His PCP is with the [facility] clinic. Discharge is pending PICC placement.  Eads orders placed- have notified Lori with [facility]- bedside teaching has been completed by Pam with [facility] with daughter for home IV abx- CM also provided daughter with PCS list per request along with advance directives packet per request. Daughter to take pt home.   Dahlia Client [employer], RN 10/24/2016, 2:05 PM [phone]

## 2016-10-25 ENCOUNTER — Telehealth (HOSPITAL_COMMUNITY): Payer: Self-pay | Admitting: Internal Medicine

## 2016-10-25 DIAGNOSIS — Z87891 Personal history of nicotine dependence: Secondary | ICD-10-CM | POA: Diagnosis not present

## 2016-10-25 DIAGNOSIS — Z5181 Encounter for therapeutic drug level monitoring: Secondary | ICD-10-CM | POA: Diagnosis not present

## 2016-10-25 DIAGNOSIS — Z955 Presence of coronary angioplasty implant and graft: Secondary | ICD-10-CM | POA: Diagnosis not present

## 2016-10-25 DIAGNOSIS — Z452 Encounter for adjustment and management of vascular access device: Secondary | ICD-10-CM | POA: Diagnosis not present

## 2016-10-25 DIAGNOSIS — Z951 Presence of aortocoronary bypass graft: Secondary | ICD-10-CM | POA: Diagnosis not present

## 2016-10-25 DIAGNOSIS — Z792 Long term (current) use of antibiotics: Secondary | ICD-10-CM | POA: Diagnosis not present

## 2016-10-25 DIAGNOSIS — I10 Essential (primary) hypertension: Secondary | ICD-10-CM | POA: Diagnosis not present

## 2016-10-25 DIAGNOSIS — R7881 Bacteremia: Secondary | ICD-10-CM | POA: Diagnosis not present

## 2016-10-25 DIAGNOSIS — J8410 Pulmonary fibrosis, unspecified: Secondary | ICD-10-CM | POA: Diagnosis not present

## 2016-10-25 DIAGNOSIS — G20 Parkinson's disease: Secondary | ICD-10-CM | POA: Diagnosis not present

## 2016-10-25 DIAGNOSIS — I252 Old myocardial infarction: Secondary | ICD-10-CM | POA: Diagnosis not present

## 2016-10-25 DIAGNOSIS — J449 Chronic obstructive pulmonary disease, unspecified: Secondary | ICD-10-CM | POA: Diagnosis not present

## 2016-10-25 DIAGNOSIS — I2510 Atherosclerotic heart disease of native coronary artery without angina pectoris: Secondary | ICD-10-CM | POA: Diagnosis not present

## 2016-10-25 DIAGNOSIS — L03115 Cellulitis of right lower limb: Secondary | ICD-10-CM | POA: Diagnosis not present

## 2016-10-25 DIAGNOSIS — J45909 Unspecified asthma, uncomplicated: Secondary | ICD-10-CM | POA: Diagnosis not present

## 2016-10-25 DIAGNOSIS — Z9981 Dependence on supplemental oxygen: Secondary | ICD-10-CM | POA: Diagnosis not present

## 2016-10-25 DIAGNOSIS — B9561 Methicillin susceptible Staphylococcus aureus infection as the cause of diseases classified elsewhere: Secondary | ICD-10-CM | POA: Diagnosis not present

## 2016-10-25 DIAGNOSIS — R7303 Prediabetes: Secondary | ICD-10-CM | POA: Diagnosis not present

## 2016-10-26 ENCOUNTER — Ambulatory Visit (INDEPENDENT_AMBULATORY_CARE_PROVIDER_SITE_OTHER)
Admission: RE | Admit: 2016-10-26 | Discharge: 2016-10-26 | Disposition: A | Discharge status: Home / Self Care | Payer: Medicare Other | Source: Ambulatory Visit | Attending: Pulmonary Disease | Admitting: Pulmonary Disease

## 2016-10-26 ENCOUNTER — Inpatient Hospital Stay (HOSPITAL_COMMUNITY): Admission: RE | Admit: 2016-10-26 | Payer: Medicare Other | Source: Ambulatory Visit

## 2016-10-26 ENCOUNTER — Ambulatory Visit (INDEPENDENT_AMBULATORY_CARE_PROVIDER_SITE_OTHER): Payer: Medicare Other | Admitting: Pulmonary Disease

## 2016-10-26 ENCOUNTER — Telehealth: Payer: Self-pay | Admitting: Pulmonary Disease

## 2016-10-26 ENCOUNTER — Other Ambulatory Visit (INDEPENDENT_AMBULATORY_CARE_PROVIDER_SITE_OTHER): Payer: Medicare Other

## 2016-10-26 ENCOUNTER — Encounter: Payer: Self-pay | Admitting: Pulmonary Disease

## 2016-10-26 VITALS — BP 96/60 | HR 97 | Ht 70.5 in | Wt 180.4 lb

## 2016-10-26 DIAGNOSIS — J849 Interstitial pulmonary disease, unspecified: Secondary | ICD-10-CM

## 2016-10-26 DIAGNOSIS — I2510 Atherosclerotic heart disease of native coronary artery without angina pectoris: Secondary | ICD-10-CM | POA: Diagnosis not present

## 2016-10-26 DIAGNOSIS — R0602 Shortness of breath: Secondary | ICD-10-CM | POA: Diagnosis not present

## 2016-10-26 DIAGNOSIS — J449 Chronic obstructive pulmonary disease, unspecified: Secondary | ICD-10-CM

## 2016-10-26 DIAGNOSIS — J9611 Chronic respiratory failure with hypoxia: Secondary | ICD-10-CM | POA: Diagnosis not present

## 2016-10-26 DIAGNOSIS — R05 Cough: Secondary | ICD-10-CM | POA: Diagnosis not present

## 2016-10-26 LAB — CULTURE, BLOOD (ROUTINE X 2)
Culture: NO GROWTH
Culture: NO GROWTH
SPECIAL REQUESTS: ADEQUATE
Special Requests: ADEQUATE

## 2016-10-26 LAB — CBC WITH DIFFERENTIAL/PLATELET
Basophils Absolute: 0.1 10*3/uL (ref 0.0–0.1)
Basophils Relative: 1 % (ref 0.0–3.0)
EOS ABS: 0.4 10*3/uL (ref 0.0–0.7)
Eosinophils Relative: 4.6 % (ref 0.0–5.0)
HCT: 42.3 % (ref 39.0–52.0)
Hemoglobin: 13.6 g/dL (ref 13.0–17.0)
LYMPHS ABS: 1 10*3/uL (ref 0.7–4.0)
Lymphocytes Relative: 13 % (ref 12.0–46.0)
MCHC: 32 g/dL (ref 30.0–36.0)
MCV: 89.2 fl (ref 78.0–100.0)
MONO ABS: 0.8 10*3/uL (ref 0.1–1.0)
MONOS PCT: 10.4 % (ref 3.0–12.0)
NEUTROS ABS: 5.6 10*3/uL (ref 1.4–7.7)
NEUTROS PCT: 71 % (ref 43.0–77.0)
PLATELETS: 343 10*3/uL (ref 150.0–400.0)
RBC: 4.75 Mil/uL (ref 4.22–5.81)
RDW: 17.2 % — ABNORMAL HIGH (ref 11.5–15.5)
WBC: 7.8 10*3/uL (ref 4.0–10.5)

## 2016-10-26 NOTE — Progress Notes (Signed)
Subjective:    Patient ID: Bradley Bennett, male    DOB: 28-Feb-1932, 83 y.o.   MRN: [id number]  Synopsis: On her fibrosis and possible COPD with a history of chronic aspiration in the setting of Parkinson's disease. Uses 3-4 L of oxygen continuously.  He was started on oxygen around 2013 after his diagnosis of COPD in 2013.  He was diagnosed with an ILD around 55 in [place]. A modified barium swallow in 2015 showed aspiration.  He smoked 35 years, 2 ppd, quit around 1992.   HPI Chief Complaint  Patient presents with  . Hospitalization Follow-up    Pt was in hosp. 10/19/16-10/24/16 due to O2 sats low, fever, and infection of rt foot. O2 sats dropped to 68%. SOB all the time with a prod. cough. 6L O2 at rest with 15L on exertion.   Elis was hospitalized for MSSA bacteremia due to a R leg infection.  He has been having more trouble breathing since the last visit.   On 10/10/2016 he left the house and ran out of oxygen.  He came home and collapsed.  EMS was called and actually had to bag him for 5 minutes and he was taken to the ER.  His daughter says that he has not been well since then.  He was actually discharged form the hospital that night.  About 9 days later his daughter found him confused, had a fever.    He can only use up to 5L O2 on his current concentrator and his daughter says that he can't keep his O2 above 82-85%.  [facility], and they made him change out his home concentrator which only goes to 5.  When walking around he is using 15LPM with his liquid oxygen which works.  He uses the oxymyzer.    No significant cough in the last two days, they say that cough isn't bad.  No swelling.  No chest pain.    Past Medical History:  Diagnosis Date  . Arthritis   . Asthma   . Coronary artery disease   . Emphysema of lung ([place])   . Hearing difficulty of both ears   . Heart attack ([place])   . Heart murmur   . Hyperlipidemia   . Hypertension   .  ILD (interstitial lung disease) ([place])   . Parkinson disease ([place])          Review of Systems  Constitutional: Positive for fatigue. Negative for chills and fever.  HENT: Positive for postnasal drip, rhinorrhea and sinus pressure. Negative for sinus pain.   Respiratory: Positive for cough and shortness of breath. Negative for wheezing.   Cardiovascular: Negative for chest pain, palpitations and leg swelling.  Gastrointestinal: Negative for abdominal distention, constipation, diarrhea and nausea.  Musculoskeletal: Negative for arthralgias, gait problem, joint swelling and neck stiffness.  Neurological: Negative for seizures, speech difficulty, light-headedness, numbness and headaches.       Objective:   Physical Exam Vitals:   10/26/16 1543  BP: 96/60  Pulse: 97  SpO2: 92%  Weight: 180 lb 6.4 oz (81.8 kg)  Height: 5' 10.5" (1.791 m)   10 L through [employer]  Gen: chronically ill appearing HENT: OP clear, TM's clear, neck supple PULM: Few crackles bases, no wheezing, normal percussion CV: RRR, no mgr, no ankle edema GI: BS+, soft, nontender Derm: no cyanosis R leg wound wrapped Psyche: normal mood and affect   Primary care records from earlier this month  reviewed were he was treated for hypothyroidism insomnia and anxiety. May 2018 chest x-ray images independently reviewed showing chronic interstitial changes in the bases with emphysema  BMET    Component Value Date/Time   NA 137 10/24/2016 0315   K 3.4 (L) 10/24/2016 0315   CL 100 (L) 10/24/2016 0315   CO2 30 10/24/2016 0315   GLUCOSE 89 10/24/2016 0315   BUN 7 10/24/2016 0315   CREATININE 0.67 10/24/2016 0315   CALCIUM 8.3 (L) 10/24/2016 0315   GFRNONAA >60 10/24/2016 0315   GFRAA >60 10/24/2016 [facility] records from September 2018 reviewed were he was treated for a staph skin and soft tissue infection and bacteremia.  Multiple conversations with DME companies and the [place] were held today by me  personally while the patient was in clinic     Assessment & Plan:  Chronic respiratory failure with hypoxia ([place]) - Plan: Ambulatory Referral for DME  ILD (interstitial lung disease) ([place]) - Plan: Ambulatory Referral for DME  Chronic obstructive pulmonary disease, unspecified COPD type ([place])  Discussion: [place] recently had cellulitis and unfortunately he's had trouble keeping his oxygen level up at home. However, this seems to be a fairly simple problem is today on physical exam his lungs are fairly clear can. To baseline and he does not appear to have pneumonia or evidence of pulmonary edema. We will check a chest x-ray and some blood work today to make sure that there is no evidence of either of these. However, the problem seems to be that when he recently switched oxygen suppliers they gave him an oxygen concentrator which will only go to 5 L/m and he needs up to 10 L/m in order to maintain his O2 saturation greater than 90%.  We will work with a local durable medical equipment company today to try to get him an oxygen concentrator tonight. He prefers to not go to the hospital to manage this.  Plan: For your chronic respiratory failure with hypoxemia: We are going to have one of our local durable medical equipment companies provide you with a new concentrator at night We will check a chest x-ray today We will check lab work today to make sure there is nothing else going on We will have you follow-up in a week to make sure things are headed in the right direction If however you are getting worse please let us know right away.  > 50% of this 1 hour long visit spent face to face   Current Outpatient Prescriptions:  .  acetaminophen (TYLENOL) 500 MG tablet, Take 500-1,000 mg by mouth every 6 (six) hours as needed for mild pain, moderate pain, fever or headache. , Disp: , Rfl:  .  alfuzosin (UROXATRAL) 10 MG 24 hr tablet, Take 10 mg by mouth daily., Disp: , Rfl: 1 .  atorvastatin  (LIPITOR) 20 MG tablet, Take 1 tablet (20 mg total) by mouth daily., Disp: 90 tablet, Rfl: 3 .  busPIRone (BUSPAR) 5 MG tablet, Take 1 tablet (5 mg total) by mouth 2 (two) times daily., Disp: 180 tablet, Rfl: 1 .  carbidopa-levodopa (SINEMET CR) 50-200 MG tablet, TAKE ONE TABLET BY MOUTH AT BEDTIME, Disp: 90 tablet, Rfl: 0 .  carbidopa-levodopa (SINEMET IR) 25-100 MG tablet, TAKE TWO TABLETS BY MOUTH THREE TIMES A DAY, Disp: 540 tablet, Rfl: 0 .  ceFAZolin (ANCEF) IVPB, Inject 2 g into the vein every 8 (eight) hours. Indication: MSSA bacteremia Last Day of Therapy:  11/03/16 Labs -  Once weekly:  CBC/D and BMP, Labs - Every other week:  ESR and CRP, Disp: 36 Units, Rfl: 0 .  clopidogrel (PLAVIX) 75 MG tablet, TAKE ONE TABLET BY MOUTH DAILY (Patient taking differently: TAKE 75MG BY MOUTH DAILY), Disp: 90 tablet, Rfl: 1 .  finasteride (PROSCAR) 5 MG tablet, Take 1 tablet (5 mg total) by mouth every evening., Disp: , Rfl:  .  fluticasone (FLONASE) 50 MCG/ACT nasal spray, Place 1 spray into both nostrils daily., Disp: 48 g, Rfl: 1 .  Fluticasone-Salmeterol (ADVAIR DISKUS) 500-50 MCG/DOSE AEPB, Inhale 1 puff into the lungs 2 (two) times daily., Disp: 180 each, Rfl: 1 .  gabapentin (NEURONTIN) 100 MG capsule, Take 2 capsules (200 mg total) by mouth at bedtime. (Patient taking differently: Take 100-400 mg by mouth See admin instructions. 173m at 8am and 4pm), Disp: 60 capsule, Rfl: 3 .  gabapentin (NEURONTIN) 400 MG capsule, Take 1 capsule (400 mg total) by mouth at bedtime., Disp: 30 capsule, Rfl: 3 .  lactose free nutrition (BOOST PLUS) LIQD, Take 237 mLs by mouth 3 (three) times daily with meals., Disp: 30 Can, Rfl: 0 .  levothyroxine (SYNTHROID, LEVOTHROID) 150 MCG tablet, TAKE ONE TABLET BY MOUTH DAILY (Patient taking differently: TAKE 150MG BY MOUTH DAILY), Disp: 90 tablet, Rfl: 1 .  mirabegron ER (MYRBETRIQ) 50 MG TB24 tablet, Take 50 mg by mouth every evening., Disp: , Rfl:  .  Multiple Vitamin  (MULTIVITAMIN WITH MINERALS) TABS tablet, Take 1 tablet by mouth daily at 12 noon., Disp: , Rfl:  .  Multiple Vitamins-Minerals (ICAPS AREDS 2 PO), Take 1-2 tablets by mouth See admin instructions. 2 tablets @ noon and 1 tablet _0 , Disp: , Rfl:  .  mupirocin ointment (BACTROBAN) 2 %, Place 1 application into the nose 2 (two) times daily., Disp: , Rfl:  .  sodium chloride (OCEAN) 0.65 % SOLN nasal spray, Place 1 spray into both nostrils as needed for congestion., Disp: 1 Bottle, Rfl: 2 .  traZODone (DESYREL) 100 MG tablet, Take 1 tablet (100 mg total) by mouth at bedtime., Disp: 90 tablet, Rfl: 1 .  umeclidinium bromide (INCRUSE ELLIPTA) 62.5 MCG/INH AEPB, Inhale 1 puff into the lungs daily., Disp: 90 each, Rfl: 1

## 2016-10-26 NOTE — Patient Instructions (Signed)
For your chronic respiratory failure with hypoxemia: We are going to have one of our local durable medical equipment companies provide you with a new concentrator at night We will check a chest x-ray today We will check lab work today to make sure there is nothing else going on We will have you follow-up in a week to make sure things are headed in the right direction If however you are getting worse please let us know right away.

## 2016-10-26 NOTE — Telephone Encounter (Signed)
Called spoke with Margarita Grizzle who reported pt was discharged 1 week ago for cellulitis/staff infection/confusion.  Pt was recommended to follow up with pulmonary in 1 week >> HFU scheduled with TP for 9.26.18  Per Margarita Grizzle, pt is to be on 15L w/ exerion, 6L at rest.  Since discharge pt's sats have been dropping just walking from his chair to bed > sats drop to 68% on 15L and takes 53mins to come up to 80s once laying in the bed.  Has PICC line, getting abx TID  Per Margarita Grizzle, pt is not experiencing any additional respiratory symptoms :: no increased SOB, cough, wheezing, tightness, chest congestion, f/c/s, hemoptysis, chest pain.  BQ with opening this afternoon at 1530 - HFU moved up to this appt time Margarita Grizzle happy with this appt date/time Nothing further needed; will sign off

## 2016-10-27 ENCOUNTER — Telehealth: Payer: Self-pay | Admitting: Internal Medicine

## 2016-10-27 DIAGNOSIS — R7881 Bacteremia: Secondary | ICD-10-CM | POA: Diagnosis not present

## 2016-10-27 DIAGNOSIS — B9561 Methicillin susceptible Staphylococcus aureus infection as the cause of diseases classified elsewhere: Secondary | ICD-10-CM | POA: Diagnosis not present

## 2016-10-27 DIAGNOSIS — J8410 Pulmonary fibrosis, unspecified: Secondary | ICD-10-CM | POA: Diagnosis not present

## 2016-10-27 DIAGNOSIS — J449 Chronic obstructive pulmonary disease, unspecified: Secondary | ICD-10-CM | POA: Diagnosis not present

## 2016-10-27 DIAGNOSIS — G20 Parkinson's disease: Secondary | ICD-10-CM | POA: Diagnosis not present

## 2016-10-27 DIAGNOSIS — L03115 Cellulitis of right lower limb: Secondary | ICD-10-CM | POA: Diagnosis not present

## 2016-10-27 LAB — BASIC METABOLIC PANEL
BUN: 21 mg/dL (ref 6–23)
CO2: 27 mEq/L (ref 19–32)
Calcium: 9 mg/dL (ref 8.4–10.5)
Chloride: 101 mEq/L (ref 96–112)
Creatinine, Ser: 0.83 mg/dL (ref 0.40–1.50)
GFR: 94.18 mL/min (ref >60.00)
Glucose, Bld: 84 mg/dL (ref 70–99)
POTASSIUM: 4.3 meq/L (ref 3.5–5.1)
Sodium: 139 mEq/L (ref 135–145)

## 2016-10-27 LAB — BRAIN NATRIURETIC PEPTIDE: PRO B NATRI PEPTIDE: 39 pg/mL (ref 0.0–100.0)

## 2016-10-27 NOTE — Telephone Encounter (Signed)
[facility]  [phone]  Need verbals for a social worker to come out

## 2016-10-27 NOTE — Telephone Encounter (Signed)
Notified Patty w/MD response../lmb 

## 2016-10-27 NOTE — Telephone Encounter (Signed)
ok 

## 2016-10-29 NOTE — Patient Instructions (Addendum)
  Medications reviewed and updated.  No changes recommended at this time.  Your prescription(s) have been submitted to your pharmacy. Please take as directed and contact our office if you believe you are having problem(s) with the medication(s).  A referral was ordered for hospice  Please followup in 6 months

## 2016-10-29 NOTE — Progress Notes (Signed)
Subjective:    Patient ID: Bradley Bennett, male    DOB: 12/11/1932, 83 y.o.   MRN: [id number]  HPI The patient is here for follow up from the hospital.  Admitted 10/19/16 - 10/24/16.  He was brought to the ED by his daughter when she found him confused.  She felt he had been confused since his dicharged the prior week after he was found down in the parking log and required bag valve mask ventilation.  The confusion has been progressive and associated with disorientation.  He had redness of his right foot, which she noticed the day she brought him to the ED.  He states the redness was there for 7 days and was not painful. Blood cultures showed MSSA bacteremia.  ID was consulted.  Ortho consulted and they advised he may need necrotic skin debridement if no improvement.  ID recommended IV Abx for two weeks.   PICC was placed.    Sepsis due to right foot cellulitis, MSSA bacteremia:  He had IVF, started on IV Vanco and Zosyn which was changed to Ancef.  MRI showed soft tissue ulcer, no osteomyelitis.  Repeat blood cultures were negative.  ABI was normal.  PICC placed.  Ancef 2 gms Q 8hr.  His daughter is giving his antibiotics.  He has a home visiting nurse.  He has follow up with orthopedics in two days.  The toe looks slightly better, but there is still a necrotic scab.  He denies any pain.    Toxic metabolic encephalopathy: improved  acute on chronic hypoxic respiratory failure from severe pulmonary fibrosis and ILD:  On chronic oxygen.  CXR showed chronic lung changes,no change from prior.  Continued on inhalers.  Echo showed mod-severe pulm htn.  He is on 15 L/min [place].  His oxygen drops to 79% walking from one room in his apartment to another.  He only notices the oxygen level dropping when he is walking, not at rest.  He has difficulty gong to the dinning room for dinner and walking from one room to another.   Parkinson's disease, Neuroapthy:  Stable.  Home medications continued.  Home PT/OT  recommended and they are coming today.   Hyponatremia:  Mild, improved.  Na 139 on 9/20  Hyperkalemia:  Mild, repleted.  Bmp on 9/20 - K was normal.   Hypothyroidism, Hyperlipidemia:  Stable  Prediabetes:  Sugars were elevated.  Had SSI. a1c is 6.0  Scheduled for 10/4 - epidural right shoulder.  plavix needs to be stopped.  Does not bother him much now - current pain is a toothache.  He is not currently walking with his walker or lifting his oxygen tank so it does not hurt that much.   He is taking gabapentin and tylenol.    Medications and allergies reviewed with patient and updated if appropriate.  Patient Active Problem List   Diagnosis Date Noted  . Hypokalemia 10/24/2016  . Bacteremia due to methicillin susceptible Staphylococcus aureus (MSSA) 10/20/2016  . Hyponatremia 10/20/2016  . Cellulitis 10/19/2016  . Sepsis ([place]) 10/19/2016  . Trigger point of right shoulder region 10/05/2016  . Cervical radiculopathy 09/15/2016  . Chronic respiratory failure ([place]) 08/21/2016  . Chronic right shoulder pain 08/21/2016  . Hypotension 07/15/2016  . Diarrhea 07/14/2016  . Prediabetes 07/13/2016  . Dyspnea 06/15/2016  . Allergic rhinitis 05/25/2016  . COPD exacerbation ([place]) 05/10/2016  . Thrush 01/15/2016  . Trigger thumb of both hands 01/15/2016  . COPD (chronic obstructive pulmonary disease) (  HCC) 07/16/2015  . Hypoxemia 07/16/2015  . Postinflammatory pulmonary fibrosis (HCC) 07/16/2015  . Parkinson disease (HCC) 07/16/2015  . Hypothyroidism 07/16/2015  . Insomnia 07/16/2015  . BPH (benign prostatic hypertrophy) 07/16/2015  . Anxiety 07/16/2015  . Carpal tunnel syndrome, right 07/16/2015  . Chronic lower back pain 07/16/2015  . Macular degeneration 07/16/2015  . CAD in native artery 06/15/2015  . 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 S/P coronary artery stent placement 06/15/2015  . Hyperlipidemia 06/15/2015    Current Outpatient Prescriptions on File Prior to Visit  Medication Sig  Dispense Refill  . acetaminophen (TYLENOL) 500 MG tablet Take 500-1,000 mg by mouth every 6 (six) hours as needed for mild pain, moderate pain, fever or headache.     . alfuzosin (UROXATRAL) 10 MG 24 hr tablet Take 10 mg by mouth daily.  1  . atorvastatin (LIPITOR) 20 MG tablet Take 1 tablet (20 mg total) by mouth daily. 90 tablet 3  . busPIRone (BUSPAR) 5 MG tablet Take 1 tablet (5 mg total) by mouth 2 (two) times daily. 180 tablet 1  . carbidopa-levodopa (SINEMET CR) 50-200 MG tablet TAKE ONE TABLET BY MOUTH AT BEDTIME 90 tablet 0  . carbidopa-levodopa (SINEMET IR) 25-100 MG tablet TAKE TWO TABLETS BY MOUTH THREE TIMES A DAY 540 tablet 0  . ceFAZolin (ANCEF) IVPB Inject 2 g into the vein every 8 (eight) hours. Indication: MSSA bacteremia Last Day of Therapy:  11/03/16 Labs - Once weekly:  CBC/D and BMP, Labs - Every other week:  ESR and CRP 36 Units 0  . clopidogrel (PLAVIX) 75 MG tablet TAKE ONE TABLET BY MOUTH DAILY (Patient taking differently: TAKE 75MG BY MOUTH DAILY) 90 tablet 1  . finasteride (PROSCAR) 5 MG tablet Take 1 tablet (5 mg total) by mouth every evening.    . fluticasone (FLONASE) 50 MCG/ACT nasal spray Place 1 spray into both nostrils daily. 48 g 1  . Fluticasone-Salmeterol (ADVAIR DISKUS) 500-50 MCG/DOSE AEPB Inhale 1 puff into the lungs 2 (two) times daily. 180 each 1  . gabapentin (NEURONTIN) 100 MG capsule Take 2 capsules (200 mg total) by mouth at bedtime. (Patient taking differently: Take 100-400 mg by mouth See admin instructions. 100mg at 8am and 4pm) 60 capsule 3  . gabapentin (NEURONTIN) 400 MG capsule Take 1 capsule (400 mg total) by mouth at bedtime. 30 capsule 3  . lactose free nutrition (BOOST PLUS) LIQD Take 237 mLs by mouth 3 (three) times daily with meals. 30 Can 0  . levothyroxine (SYNTHROID, LEVOTHROID) 150 MCG tablet TAKE ONE TABLET BY MOUTH DAILY (Patient taking differently: TAKE 150MG BY MOUTH DAILY) 90 tablet 1  . mirabegron ER (MYRBETRIQ) 50 MG TB24 tablet  Take 50 mg by mouth every evening.    . Multiple Vitamin (MULTIVITAMIN WITH MINERALS) TABS tablet Take 1 tablet by mouth daily at 12 noon.    . Multiple Vitamins-Minerals (ICAPS AREDS 2 PO) Take 1-2 tablets by mouth See admin instructions. 2 tablets @ noon and 1 tablet @dinner    . mupirocin ointment (BACTROBAN) 2 % Place 1 application into the nose 2 (two) times daily.    . sodium chloride (OCEAN) 0.65 % SOLN nasal spray Place 1 spray into both nostrils as needed for congestion. 1 Bottle 2  . traZODone (DESYREL) 100 MG tablet Take 1 tablet (100 mg total) by mouth at bedtime. 90 tablet 1  . umeclidinium bromide (INCRUSE ELLIPTA) 62.5 MCG/INH AEPB Inhale 1 puff into the lungs daily. 90 each 1     No current facility-administered medications on file prior to visit.     Past Medical History:  Diagnosis Date  . Arthritis   . Asthma   . Coronary artery disease   . Emphysema of lung ([place])   . Hearing difficulty of both ears   . Heart attack ([place])   . Heart murmur   . Hyperlipidemia   . Hypertension   . ILD (interstitial lung disease) ([place])   . Parkinson disease [facility])     Past Surgical History:  Procedure Laterality Date  . CARPAL TUNNEL RELEASE Right 03/2014  . CORONARY ANGIOPLASTY WITH STENT PLACEMENT    . CORONARY ARTERY BYPASS GRAFT  1992  . HERNIA REPAIR  10/2013   x3   . VEIN BYPASS SURGERY      Social History   Social History  . Marital status: Divorced    Spouse name: N/A  . Number of children: N/A  . Years of education: N/A   Occupational History  . retired     [place] History Main Topics  . Smoking status: Former Smoker    Packs/day: 2.00    Years: 35.00    Quit date: 08/15/1985  . Smokeless tobacco: Never Used     Comment: quit smoking in 1992  . Alcohol use 0.0 oz/week     Comment: twice every 6 months  . Drug use: No  . Sexual activity: No   Other Topics Concern  . None   Social History Narrative  . None    Family History  Problem Relation Age  of Onset  . Colon cancer Father   . Stroke Father   . Arthritis Mother   . Arthritis Sister   . Heart disease Brother   . Kidney cancer Brother     Review of Systems  Constitutional: Negative for appetite change, chills and fever.  Respiratory: Positive for shortness of breath.   Cardiovascular: Negative for chest pain and leg swelling.  Gastrointestinal: Negative for abdominal pain and diarrhea.  Neurological: Positive for light-headedness. Negative for headaches.       Objective:   Vitals:   10/30/16 1004  BP: 106/68  Pulse: 95  Temp: 97.9 F (36.6 C)  SpO2: 97%   Wt Readings from Last 3 Encounters:  10/30/16 178 lb (80.7 kg)  10/26/16 180 lb 6.4 oz (81.8 kg)  10/24/16 181 lb 1.6 oz (82.1 kg)   Body mass index is 25.18 kg/m.   Physical Exam    Constitutional: Appears well-developed and well-nourished. No distress.  HENT:  Head: Normocephalic and atraumatic.  Neck: Neck supple. No tracheal deviation present. No thyromegaly present.  No cervical lymphadenopathy Cardiovascular: Normal rate, regular rhythm and normal heart sounds.   No murmur heard.   No edema Pulmonary/Chest: Effort normal and breath sounds normal. No respiratory distress. No has no wheezes. No rales.  Abdomen: soft, non tender, non distended Skin: Skin is warm and dry. Not diaphoretic. right great toe with necrotic scab plantar surface, small ulceration surrounding, no active discharge, mild erythema and swelling right great toe Psychiatric: Normal mood and affect. Behavior is normal.      Assessment & Plan:    See Problem List for Assessment and Plan of chronic medical problems.

## 2016-10-30 ENCOUNTER — Encounter: Payer: Self-pay | Admitting: Internal Medicine

## 2016-10-30 ENCOUNTER — Ambulatory Visit (INDEPENDENT_AMBULATORY_CARE_PROVIDER_SITE_OTHER): Payer: Medicare Other | Admitting: Internal Medicine

## 2016-10-30 VITALS — BP 106/68 | HR 95 | Temp 97.9°F | Ht 70.5 in | Wt 178.0 lb

## 2016-10-30 DIAGNOSIS — R7881 Bacteremia: Secondary | ICD-10-CM | POA: Diagnosis not present

## 2016-10-30 DIAGNOSIS — J8410 Pulmonary fibrosis, unspecified: Secondary | ICD-10-CM

## 2016-10-30 DIAGNOSIS — A4101 Sepsis due to Methicillin susceptible Staphylococcus aureus: Secondary | ICD-10-CM

## 2016-10-30 DIAGNOSIS — I2510 Atherosclerotic heart disease of native coronary artery without angina pectoris: Secondary | ICD-10-CM

## 2016-10-30 DIAGNOSIS — E871 Hypo-osmolality and hyponatremia: Secondary | ICD-10-CM | POA: Diagnosis not present

## 2016-10-30 DIAGNOSIS — E876 Hypokalemia: Secondary | ICD-10-CM

## 2016-10-30 DIAGNOSIS — J449 Chronic obstructive pulmonary disease, unspecified: Secondary | ICD-10-CM | POA: Diagnosis not present

## 2016-10-30 DIAGNOSIS — G20 Parkinson's disease: Secondary | ICD-10-CM | POA: Diagnosis not present

## 2016-10-30 DIAGNOSIS — A4159 Other Gram-negative sepsis: Secondary | ICD-10-CM | POA: Diagnosis not present

## 2016-10-30 DIAGNOSIS — B9561 Methicillin susceptible Staphylococcus aureus infection as the cause of diseases classified elsewhere: Secondary | ICD-10-CM | POA: Diagnosis not present

## 2016-10-30 DIAGNOSIS — J9611 Chronic respiratory failure with hypoxia: Secondary | ICD-10-CM | POA: Diagnosis not present

## 2016-10-30 DIAGNOSIS — L03031 Cellulitis of right toe: Secondary | ICD-10-CM | POA: Diagnosis not present

## 2016-10-30 DIAGNOSIS — L03115 Cellulitis of right lower limb: Secondary | ICD-10-CM | POA: Diagnosis not present

## 2016-10-30 MED ORDER — GABAPENTIN 400 MG PO CAPS: 400.0000 mg | ORAL_CAPSULE | Freq: Every day | ORAL | 1 refills | Status: DC

## 2016-10-30 MED ORDER — GABAPENTIN 100 MG PO CAPS: ORAL_CAPSULE | ORAL | 1 refills | Status: DC

## 2016-10-30 MED ORDER — ALFUZOSIN HCL ER 10 MG PO TB24: 10.0000 mg | ORAL_TABLET | Freq: Every day | ORAL | 1 refills | Status: DC

## 2016-10-30 NOTE — Assessment & Plan Note (Signed)
following with pulmonary On chronic oxygen - 15 L/min via [place] End stage - has refused hospice in the past, but agrees to hospice at this time - will refer

## 2016-10-30 NOTE — Assessment & Plan Note (Signed)
On Vanco and Zosyn in hospital and now on Ancef No longer septic

## 2016-10-30 NOTE — Assessment & Plan Note (Signed)
Some improvement Still on Ancef via PICC Has ID follow up in two days - may need debridement

## 2016-10-30 NOTE — Assessment & Plan Note (Signed)
Improved, no longer septic - No confusion or evidence of sepsis Still on ancef for cellulitis/foot ulcer

## 2016-10-30 NOTE — Assessment & Plan Note (Signed)
Repeat cmp 9/20 - Na normal

## 2016-10-30 NOTE — Assessment & Plan Note (Signed)
Repeat cmp 9/20 - K normal

## 2016-10-30 NOTE — Assessment & Plan Note (Signed)
following with pulmonary On chronic oxygen - 15 L/min via [place] End stage - has refused hospice in the past, but agrees to hospice at this time - will refer

## 2016-10-30 NOTE — Assessment & Plan Note (Signed)
following with pulmonary On chronic oxygen - 15 L/min via [place] End stage - has refused hospice in the past, but agrees to hospice at this time - will refer

## 2016-10-31 ENCOUNTER — Ambulatory Visit (INDEPENDENT_AMBULATORY_CARE_PROVIDER_SITE_OTHER): Payer: Medicare Other | Admitting: Orthopaedic Surgery

## 2016-10-31 ENCOUNTER — Encounter (INDEPENDENT_AMBULATORY_CARE_PROVIDER_SITE_OTHER): Payer: Self-pay | Admitting: Orthopaedic Surgery

## 2016-10-31 ENCOUNTER — Ambulatory Visit (HOSPITAL_COMMUNITY): Payer: Medicare Other

## 2016-10-31 ENCOUNTER — Other Ambulatory Visit: Payer: Medicare Other

## 2016-10-31 ENCOUNTER — Telehealth: Payer: Self-pay | Admitting: Internal Medicine

## 2016-10-31 ENCOUNTER — Telehealth (INDEPENDENT_AMBULATORY_CARE_PROVIDER_SITE_OTHER): Payer: Self-pay | Admitting: Radiology

## 2016-10-31 DIAGNOSIS — J8410 Pulmonary fibrosis, unspecified: Secondary | ICD-10-CM | POA: Diagnosis not present

## 2016-10-31 DIAGNOSIS — I2510 Atherosclerotic heart disease of native coronary artery without angina pectoris: Secondary | ICD-10-CM

## 2016-10-31 DIAGNOSIS — B9561 Methicillin susceptible Staphylococcus aureus infection as the cause of diseases classified elsewhere: Secondary | ICD-10-CM

## 2016-10-31 DIAGNOSIS — L03115 Cellulitis of right lower limb: Secondary | ICD-10-CM | POA: Diagnosis not present

## 2016-10-31 DIAGNOSIS — R7881 Bacteremia: Secondary | ICD-10-CM | POA: Diagnosis not present

## 2016-10-31 DIAGNOSIS — G20 Parkinson's disease: Secondary | ICD-10-CM | POA: Diagnosis not present

## 2016-10-31 DIAGNOSIS — J449 Chronic obstructive pulmonary disease, unspecified: Secondary | ICD-10-CM | POA: Diagnosis not present

## 2016-10-31 NOTE — Progress Notes (Signed)
Office Visit Note   Patient: Bradley Bennett           Date of Birth: [birth year]           MRN: [id number] Visit Date: 10/31/2016              Requested by: [name], MD [place], [place] [id number] PCP: [name], MD   Assessment & Plan: Visit Diagnoses:  1. Bacteremia due to methicillin susceptible Staphylococcus aureus (MSSA)   With the cellulitis right great toe.  Plan: Foot is improved. There is trace serous drainage along the age where he has some skin necrosis with eschar formation. He is continuing his antibiotics and I'll recheck him in 3 weeks continue dressing changes and his son is there is no longer any drainage on the dressing that can stop the mupirocin. I will recheck him again in 3 weeks.  Follow-Up Instructions: No Follow-up on file.   Orders:  No orders of the defined types were placed in this encounter.  No orders of the defined types were placed in this encounter.     Procedures: No procedures performed   Clinical Data: No additional findings.   Subjective: Chief Complaint  Patient presents with  . Right Foot - Wound Check    HPI patient was seen in the hospital with the staph bacteremia and acute cellulitis of his foot. He responded antibiotics did not require debridement of the great toe medial and plantar surface which had some full-thickness skin loss. He's responded to the antibiotics. He is on chronic oxygen from COPD he also has coronary artery disease among other problems.  Review of Systems review of systems 14 point updated and is unchanged from his hospitalization when I saw him in consultation   Objective: Vital Signs: There were no vitals taken for this visit.  Physical Exam  Constitutional: He is oriented to person, place, and time. He appears well-developed and well-nourished.  HENT:  Head: Normocephalic and atraumatic.  Eyes: Pupils are equal, round, and reactive to light. EOM are normal.  Neck: No  tracheal deviation present. No thyromegaly present.  Cardiovascular: Normal rate.   Pulmonary/Chest: Effort normal. He has no wheezes.  Abdominal: Soft. Bowel sounds are normal.  Neurological: He is alert and oriented to person, place, and time.  Skin: Skin is warm and dry. Capillary refill takes less than 2 seconds.  Psychiatric: He has a normal mood and affect. His behavior is normal. Judgment and thought content normal.    Ortho Exam patient has eschar with trace serous drainage from the edge no purulent drainage. Pressure over the eschar shows no purulent drainage. Other areas remote to this area has some scab which has normal tissue underneath. Specialty Comments:  No specialty comments available.  Imaging: No results found.   PMFS History: Patient Active Problem List   Diagnosis Date Noted  . Hypokalemia 10/24/2016  . Bacteremia due to methicillin susceptible Staphylococcus aureus (MSSA) 10/20/2016  . Hyponatremia 10/20/2016  . Cellulitis 10/19/2016  . Sepsis ([place]) 10/19/2016  . Trigger point of right shoulder region 10/05/2016  . Cervical radiculopathy 09/15/2016  . Chronic respiratory failure ([place]) 08/21/2016  . Chronic right shoulder pain 08/21/2016  . Hypotension 07/15/2016  . Diarrhea 07/14/2016  . Prediabetes 07/13/2016  . Dyspnea 06/15/2016  . Allergic rhinitis 05/25/2016  . COPD exacerbation ([place]) 05/10/2016  . Trigger thumb of both hands 01/15/2016  . COPD (chronic obstructive pulmonary disease) ([place]) 07/16/2015  . Hypoxemia  07/16/2015  . Postinflammatory pulmonary fibrosis ([place]) 07/16/2015  . Parkinson disease ([place]) 07/16/2015  . Hypothyroidism 07/16/2015  . Insomnia 07/16/2015  . BPH (benign prostatic hypertrophy) 07/16/2015  . Anxiety 07/16/2015  . Carpal tunnel syndrome, right 07/16/2015  . Chronic lower back pain 07/16/2015  . Macular degeneration 07/16/2015  . CAD in native artery 06/15/2015  . 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 S/P coronary artery stent  placement 06/15/2015  . Hyperlipidemia 06/15/2015   Past Medical History:  Diagnosis Date  . Arthritis   . Asthma   . Coronary artery disease   . Emphysema of lung ([place])   . Hearing difficulty of both ears   . Heart attack ([place])   . Heart murmur   . Hyperlipidemia   . Hypertension   . ILD (interstitial lung disease) ([place])   . Parkinson disease ([place])     Family History  Problem Relation Age of Onset  . Colon cancer Father   . Stroke Father   . Arthritis Mother   . Arthritis Sister   . Heart disease Brother   . Kidney cancer Brother     Past Surgical History:  Procedure Laterality Date  . CARPAL TUNNEL RELEASE Right 03/2014  . CORONARY ANGIOPLASTY WITH STENT PLACEMENT    . CORONARY ARTERY BYPASS GRAFT  1992  . HERNIA REPAIR  10/2013   x3   . VEIN BYPASS SURGERY     Social History   Occupational History  . retired     [place] History Main Topics  . Smoking status: Former Smoker    Packs/day: 2.00    Years: 35.00    Quit date: 08/15/1985  . Smokeless tobacco: Never Used     Comment: quit smoking in 1992  . Alcohol use 0.0 oz/week     Comment: twice every 6 months  . Drug use: No  . Sexual activity: No

## 2016-10-31 NOTE — Telephone Encounter (Signed)
I spoke with patient's daughter. She states that infectious disease is no longer following patient and they were told to see the PCP and not to make another appt. Patient's daughter just wants to be sure that he does not need any oral antibiotics after last PICC line dose on Friday.  Please advise.

## 2016-10-31 NOTE — Telephone Encounter (Signed)
Patient was seen this morning and daughter states that you discussed patient going on PO antibiotics after he completed IV antibiotics through the PICC line. Those are scheduled through Friday. She would like to know if she needs these called in?  If so, she would like them called to [facility] at [phone].

## 2016-10-31 NOTE — Telephone Encounter (Signed)
Spoke with Mary to give verbal orders per MD 

## 2016-10-31 NOTE — Telephone Encounter (Signed)
[facility], OK to stop from my standpoint.

## 2016-10-31 NOTE — Telephone Encounter (Signed)
Ok to stop ABX when IV ABX done. Infectious disease service are monitoring not [place]. [facility]

## 2016-10-31 NOTE — Telephone Encounter (Signed)
Needs verbals for PT for 2week2

## 2016-11-01 ENCOUNTER — Encounter: Payer: Self-pay | Admitting: Internal Medicine

## 2016-11-01 ENCOUNTER — Other Ambulatory Visit: Payer: Self-pay | Admitting: Pharmacist

## 2016-11-01 ENCOUNTER — Encounter: Payer: Self-pay | Admitting: Adult Health

## 2016-11-01 ENCOUNTER — Ambulatory Visit (INDEPENDENT_AMBULATORY_CARE_PROVIDER_SITE_OTHER): Payer: Medicare Other | Admitting: Adult Health

## 2016-11-01 DIAGNOSIS — J8410 Pulmonary fibrosis, unspecified: Secondary | ICD-10-CM | POA: Diagnosis not present

## 2016-11-01 DIAGNOSIS — Z23 Encounter for immunization: Secondary | ICD-10-CM | POA: Diagnosis not present

## 2016-11-01 DIAGNOSIS — J449 Chronic obstructive pulmonary disease, unspecified: Secondary | ICD-10-CM | POA: Diagnosis not present

## 2016-11-01 DIAGNOSIS — J9611 Chronic respiratory failure with hypoxia: Secondary | ICD-10-CM

## 2016-11-01 DIAGNOSIS — I2510 Atherosclerotic heart disease of native coronary artery without angina pectoris: Secondary | ICD-10-CM | POA: Diagnosis not present

## 2016-11-01 NOTE — Progress Notes (Signed)
'@Patient'  ID: Bradley Bennett, male    DOB: 10-31-1932, 83 y.o.   MRN: [id number]  No chief complaint on file.   Referring provider: [name], MD  HPI: 83 year old male followed for pulmonary fibrosis and COPD with a history of chronic aspiration in the setting of Parkinson's disease. Along with chronic hypoxic respiratory failure.   11/01/2016 Follow up : ILD , COPD , O2 RF  Patient returns for one-week follow-up. Patient was seen last visit after recent hospitalization for MSSA bacteremia due to a right leg infection. Patient had been having increased oxygen demand. Since discharge. O2 saturations were running 82-85% despite being on 5 L of oxygen. Patient needed a higher flow oxygen concentrator.. At baseline. Patient had been using 5 L of oxygen at rest and 15 L of oxygen with walking. Agent was changed over to a high flow oxygen concentrator at home. He uses liquid oxygen when he is outside the home. Chest x-ray and labs. Last visit were unrevealing for acute process. Since last visit. Patient is improved. He feels that he is more comfortable on his current oxygen settings. He is currently using 8 L at rest. And 15 L with walking. He continues to become very weak and short of breath with any type of activity. At rest, patient says he does pretty well. He denies any increased cough, congestion, fever, chest pain, orthopnea, PND or leg swelling.   Allergies  Allergen Reactions  . Isosorbide Other (See Comments)    Reaction:  Headaches     Immunization History  Administered Date(s) Administered  . Influenza Split 11/09/2014  . Influenza, High Dose Seasonal PF 12/04/2015  . Pneumococcal Conjugate-13 01/14/2016  . Pneumococcal Polysaccharide-23 10/12/2014    Past Medical History:  Diagnosis Date  . Arthritis   . Asthma   . Coronary artery disease   . Emphysema of lung ([place])   . Hearing difficulty of both ears   . Heart attack ([place])   . Heart murmur   . Hyperlipidemia     . Hypertension   . ILD (interstitial lung disease) ([place])   . Parkinson disease ([place])     Tobacco History: History  Smoking Status  . Former Smoker  . Packs/day: 2.00  . Years: 35.00  . Quit date: 08/15/1985  Smokeless Tobacco  . Never Used    Comment: quit smoking in 1992   Counseling given: Not Answered   Outpatient Encounter Prescriptions as of 11/01/2016  Medication Sig  . acetaminophen (TYLENOL) 500 MG tablet Take 500-1,000 mg by mouth every 6 (six) hours as needed for mild pain, moderate pain, fever or headache.   . alfuzosin (UROXATRAL) 10 MG 24 hr tablet Take 1 tablet (10 mg total) by mouth daily.  Marland Kitchen atorvastatin (LIPITOR) 20 MG tablet Take 1 tablet (20 mg total) by mouth daily.  . busPIRone (BUSPAR) 5 MG tablet Take 1 tablet (5 mg total) by mouth 2 (two) times daily.  . carbidopa-levodopa (SINEMET CR) 50-200 MG tablet TAKE ONE TABLET BY MOUTH AT BEDTIME  . carbidopa-levodopa (SINEMET IR) 25-100 MG tablet TAKE TWO TABLETS BY MOUTH THREE TIMES A DAY  . ceFAZolin (ANCEF) IVPB Inject 2 g into the vein every 8 (eight) hours. Indication: MSSA bacteremia Last Day of Therapy:  11/03/16 Labs - Once weekly:  CBC/D and BMP, Labs - Every other week:  ESR and CRP  . clopidogrel (PLAVIX) 75 MG tablet TAKE ONE TABLET BY MOUTH DAILY (Patient taking differently: TAKE 75MG BY MOUTH DAILY)  .  finasteride (PROSCAR) 5 MG tablet Take 1 tablet (5 mg total) by mouth every evening.  . fluticasone (FLONASE) 50 MCG/ACT nasal spray Place 1 spray into both nostrils daily.  . Fluticasone-Salmeterol (ADVAIR DISKUS) 500-50 MCG/DOSE AEPB Inhale 1 puff into the lungs 2 (two) times daily.  Marland Kitchen gabapentin (NEURONTIN) 100 MG capsule 156m at 8am and 4pm  . gabapentin (NEURONTIN) 400 MG capsule Take 1 capsule (400 mg total) by mouth at bedtime.  . lactose free nutrition (BOOST PLUS) LIQD Take 237 mLs by mouth 3 (three) times daily with meals.  .Marland Kitchenlevothyroxine (SYNTHROID, LEVOTHROID) 150 MCG tablet TAKE ONE  TABLET BY MOUTH DAILY (Patient taking differently: TAKE 150MG BY MOUTH DAILY)  . mirabegron ER (MYRBETRIQ) 50 MG TB24 tablet Take 50 mg by mouth every evening.  . Multiple Vitamin (MULTIVITAMIN WITH MINERALS) TABS tablet Take 1 tablet by mouth daily at 12 noon.  . Multiple Vitamins-Minerals (ICAPS AREDS 2 PO) Take 1-2 tablets by mouth See admin instructions. 2 tablets @ noon and 1 tablet '@dinner'   . mupirocin ointment (BACTROBAN) 2 % Place 1 application into the nose 2 (two) times daily.  . sodium chloride (OCEAN) 0.65 % SOLN nasal spray Place 1 spray into both nostrils as needed for congestion.  . traZODone (DESYREL) 100 MG tablet Take 1 tablet (100 mg total) by mouth at bedtime.  .Marland Kitchenumeclidinium bromide (INCRUSE ELLIPTA) 62.5 MCG/INH AEPB Inhale 1 puff into the lungs daily.   No facility-administered encounter medications on file as of 11/01/2016.      Review of Systems  Constitutional:   No  weight loss, night sweats,  Fevers, chills,  +fatigue, or  lassitude.  HEENT:   No headaches,  Difficulty swallowing,  Tooth/dental problems, or  Sore throat,                No sneezing, itching, ear ache, nasal congestion, post nasal drip,   CV:  No chest pain,  Orthopnea, PND, swelling in lower extremities, anasarca, dizziness, palpitations, syncope.   GI  No heartburn, indigestion, abdominal pain, nausea, vomiting, diarrhea, change in bowel habits, loss of appetite, bloody stools.   Resp:    No chest wall deformity  Skin: no rash or lesions.  GU: no dysuria, change in color of urine, no urgency or frequency.  No flank pain, no hematuria   MS:  No joint pain or swelling.  No decreased range of motion.  No back pain.    Physical Exam  BP 112/70 (BP Location: Left Arm, Cuff Size: Normal)   Pulse 93   Ht 5' 10.5" (1.791 m)   Wt 172 lb (78 kg)   SpO2 92%   BMI 24.33 kg/m   GEN: A/Ox3; pleasant , NAD, elderly, chronically ill-appearing on oxygen, in wheelchair   HEENT:  [place]/AT,   EACs-clear, TMs-wnl, NOSE-clear, THROAT-clear, no lesions, no postnasal drip or exudate noted.   NECK:  Supple w/ fair ROM; no JVD; normal carotid impulses w/o bruits; no thyromegaly or nodules palpated; no lymphadenopathy.    RESP  Bibasilar crackles,  no accessory muscle use, no dullness to percussion  CARD:  RRR, no m/r/g, no peripheral edema, pulses intact, no cyanosis or clubbing.  GI:   Soft & nt; nml bowel sounds; no organomegaly or masses detected.   Musco: Warm bil, no deformities or joint swelling noted.   Neuro: alert, no focal deficits noted.    Skin: Warm, no lesions or rashes    Lab Results:  CBC   Imaging: Dg  Chest 2 View  Result Date: 10/27/2016 CLINICAL DATA:  Cough with decreased oxygen saturation. History of interstitial lung disease EXAM: CHEST  2 VIEW COMPARISON:  October 19, 2016; October 10, 2016 ; July 28, 2015 FINDINGS: There is extensive pulmonary fibrosis throughout the lungs bilaterally, stable. Multiple areas of scarring are noted, stable. There is evident bullous disease in the apices, more on the right than on the left. There is no demonstrable superimposed edema or consolidation. The heart size and pulmonary vascularity are normal. No adenopathy. Coronary stents are noted. There is aortic atherosclerosis. Central catheter tip is in the superior vena cava. No evident pneumothorax. No bone lesions. No evident adenopathy. IMPRESSION: Widespread fibrotic change with scattered areas of cicatrization, stable. No frank edema or consolidation. Stable cardiac silhouette. Central catheter tip in superior vena cava. No pneumothorax. There is aortic atherosclerosis. Aortic Atherosclerosis (ICD10-I70.0). Electronically Signed   By: Lowella Grip III M.D.   On: 10/27/2016 09:44   Dg Chest 2 View  Result Date: 10/19/2016 CLINICAL DATA:  Shortness of breath. History of emphysema and hypertension. EXAM: CHEST  2 VIEW COMPARISON:  Chest radiograph October 10, 2016  FINDINGS: Cardiac silhouette is similarly enlarged. Calcified aortic knob. Status post median sternotomy for CABG with coronary artery stent. Diffuse interstitial prominence with RIGHT greater LEFT apical bullous changes. Flattened hemidiaphragms. Similar scarring RIGHT mid lung zone. No pleural effusion or focal consolidation. No pneumothorax. Soft tissue planes and included osseous structures are unchanged. IMPRESSION: Stable interstitial prominence concerning for chronic interstitial lung disease superimposed on COPD. Stable cardiomegaly, status post CABG and coronary artery stenting. Aortic Atherosclerosis (ICD10-I70.0). Electronically Signed   By: Elon Alas M.D.   On: 10/19/2016 15:45   Mr Foot Right W Wo Contrast  Result Date: 10/20/2016 CLINICAL DATA:  Left great toe ulcer.  Evaluate for osteomyelitis. EXAM: MRI OF THE RIGHT FOREFOOT WITHOUT AND WITH CONTRAST TECHNIQUE: Multiplanar, multisequence MR imaging of the right forefoot was performed before and after the administration of intravenous contrast. CONTRAST:  43m MULTIHANCE GADOBENATE DIMEGLUMINE 529 MG/ML IV SOLN COMPARISON:  Right foot x-rays from yesterday. FINDINGS: Bones/Joint/Cartilage No suspicious marrow signal abnormality. Mild degenerative changes of the first MTP and IP joints. No fracture or dislocation. Ligaments The Lisfranc ligament is intact. The collateral ligaments are intact. Muscles and Tendons Severe fatty atrophy of the intrinsic muscles of the forefoot. The visualized flexor and extensor tendons are intact. Soft tissues Soft tissue ulceration along the medial aspect of the first distal phalanx. Mild surrounding soft tissue enhancement. No drainable fluid collection IMPRESSION: 1. Soft tissue ulceration along the medial aspect of the great toe at the level of the first distal phalanx. No evidence of osteomyelitis or drainable fluid collection. Electronically Signed   By: WTitus DubinM.D.   On: 10/20/2016 12:21   Dg  Chest Port 1 View  Result Date: 10/10/2016 CLINICAL DATA:  Shortness of breath. EXAM: PORTABLE CHEST 1 VIEW COMPARISON:  Radiographs Jun 15, 2016. FINDINGS: Stable cardiomediastinal silhouette. Sternotomy wires are noted. Emphysematous disease is noted in the upper lobes bilaterally, with diffuse interstitial densities in both lungs which may be more prominent compared to prior exam. This is most consistent with underlying chronic interstitial lung disease or fibrosis, with possible superimposed edema or inflammation. No pneumothorax or pleural effusion is noted. Bony thorax is unremarkable. IMPRESSION: Stable bilateral upper lobe emphysematous disease is noted. Mildly increased diffuse interstitial densities are noted in both lower lobes concerning for chronic interstitial lung disease or pulmonary fibrosis with  possible superimposed acute edema or inflammation. Electronically Signed   By: Marijo Conception, M.D.   On: 10/10/2016 14:18   Dg Foot Complete Right  Result Date: 10/19/2016 CLINICAL DATA:  Right great toe infection. Evaluate for gas in soft tissue. EXAM: RIGHT FOOT COMPLETE - 3+ VIEW COMPARISON:  None. FINDINGS: Negative for gas or foreign body in the great toe, or elsewhere. No visible ulcer or osteomyelitis. No acute fracture. Mild first metatarsal head irregularity that is likely degenerative. IMPRESSION: Negative for soft tissue gas, opaque foreign body, or acute osseous finding. Electronically Signed   By: Monte Fantasia M.D.   On: 10/19/2016 15:42     Assessment & Plan:   Postinflammatory pulmonary fibrosis (HCC) Stable without notable flare. Seems to be progressive as O2 demands are slowly increasing  Plan  Patient Instructions  Continue on current oxygen level.  Change oxygen device as needed to keep Oxygen level >88-90%.  Continue on Advair and Incruse.  Flu shot today .  Follow up with Dr Lake Bells in 2 months and As needed       COPD (chronic obstructive pulmonary  disease) ([place]) Currently stable. Continue on current regimen  Chronic respiratory failure ([place]) Continue on oxygen at current settings to keep O2 saturations greater than 88-90%.     Rexene Edison, NP 11/01/2016

## 2016-11-01 NOTE — Assessment & Plan Note (Signed)
Currently stable.  Continue on current regimen. 

## 2016-11-01 NOTE — Telephone Encounter (Signed)
I left voicemail for patient's daughter advising.

## 2016-11-01 NOTE — Assessment & Plan Note (Signed)
Continue on oxygen at current settings to keep O2 saturations greater than 88-90%.

## 2016-11-01 NOTE — Assessment & Plan Note (Signed)
Stable without notable flare. Seems to be progressive as O2 demands are slowly increasing  Plan  Patient Instructions  Continue on current oxygen level.  Change oxygen device as needed to keep Oxygen level >88-90%.  Continue on Advair and Incruse.  Flu shot today .  Follow up with Dr Lake Bells in 2 months and As needed

## 2016-11-01 NOTE — Patient Instructions (Addendum)
Continue on current oxygen level.  Change oxygen device as needed to keep Oxygen level >88-90%.  Continue on Advair and Incruse.  Flu shot today .  Follow up with Dr Lake Bells in 2 months and As needed

## 2016-11-01 NOTE — Progress Notes (Signed)
Reviewed, agree 

## 2016-11-02 ENCOUNTER — Inpatient Hospital Stay (HOSPITAL_COMMUNITY)
Admission: RE | Admit: 2016-11-02 | Discharge: 2016-11-02 | Disposition: A | Discharge status: Home / Self Care | Payer: Medicare Other | Source: Ambulatory Visit

## 2016-11-02 DIAGNOSIS — J449 Chronic obstructive pulmonary disease, unspecified: Secondary | ICD-10-CM | POA: Diagnosis not present

## 2016-11-02 DIAGNOSIS — G20 Parkinson's disease: Secondary | ICD-10-CM | POA: Diagnosis not present

## 2016-11-02 DIAGNOSIS — L03115 Cellulitis of right lower limb: Secondary | ICD-10-CM | POA: Diagnosis not present

## 2016-11-02 DIAGNOSIS — R7881 Bacteremia: Secondary | ICD-10-CM | POA: Diagnosis not present

## 2016-11-02 DIAGNOSIS — J8410 Pulmonary fibrosis, unspecified: Secondary | ICD-10-CM | POA: Diagnosis not present

## 2016-11-02 DIAGNOSIS — B9561 Methicillin susceptible Staphylococcus aureus infection as the cause of diseases classified elsewhere: Secondary | ICD-10-CM | POA: Diagnosis not present

## 2016-11-02 NOTE — Progress Notes (Signed)
Bradley Bennett has decided to drop out of the Pulmonary Maintenance Program due to  he is now  physically unable to exercise. I have encouraged him to let us know if we can be of any assistance to him in the future.

## 2016-11-03 ENCOUNTER — Telehealth (INDEPENDENT_AMBULATORY_CARE_PROVIDER_SITE_OTHER): Payer: Self-pay | Admitting: Orthopaedic Surgery

## 2016-11-03 ENCOUNTER — Ambulatory Visit: Payer: Medicare Other | Admitting: Adult Health

## 2016-11-03 ENCOUNTER — Telehealth (INDEPENDENT_AMBULATORY_CARE_PROVIDER_SITE_OTHER): Payer: Self-pay | Admitting: Radiology

## 2016-11-03 MED ORDER — DOXYCYCLINE HYCLATE 100 MG PO CAPS: ORAL_CAPSULE | ORAL | 0 refills | Status: DC

## 2016-11-03 NOTE — Telephone Encounter (Signed)
error 

## 2016-11-03 NOTE — Telephone Encounter (Signed)
Patient's daughter called concerned that patient is not going to be on oral antibiotics after he finishes his PICC line antibiotics today. She states that Infectious Disease never saw the patient outside of the hospital. Per his daughter, his toe continues to have some drainage.  I spoke with Dr. Lorin Mercy by phone who wants to send in Doxycycline 100mg  bid x 2 weeks - do not take with milk.  I called patient's daughter and advised.

## 2016-11-06 ENCOUNTER — Other Ambulatory Visit: Payer: Self-pay | Admitting: Pharmacist

## 2016-11-06 DIAGNOSIS — B9561 Methicillin susceptible Staphylococcus aureus infection as the cause of diseases classified elsewhere: Secondary | ICD-10-CM | POA: Diagnosis not present

## 2016-11-06 DIAGNOSIS — L03115 Cellulitis of right lower limb: Secondary | ICD-10-CM | POA: Diagnosis not present

## 2016-11-06 DIAGNOSIS — J8410 Pulmonary fibrosis, unspecified: Secondary | ICD-10-CM | POA: Diagnosis not present

## 2016-11-06 DIAGNOSIS — R7881 Bacteremia: Secondary | ICD-10-CM | POA: Diagnosis not present

## 2016-11-06 DIAGNOSIS — J449 Chronic obstructive pulmonary disease, unspecified: Secondary | ICD-10-CM | POA: Diagnosis not present

## 2016-11-06 DIAGNOSIS — G20 Parkinson's disease: Secondary | ICD-10-CM | POA: Diagnosis not present

## 2016-11-07 ENCOUNTER — Telehealth: Payer: Self-pay | Admitting: Internal Medicine

## 2016-11-07 ENCOUNTER — Ambulatory Visit (HOSPITAL_COMMUNITY): Payer: Medicare Other

## 2016-11-07 DIAGNOSIS — L03115 Cellulitis of right lower limb: Secondary | ICD-10-CM | POA: Diagnosis not present

## 2016-11-07 DIAGNOSIS — J449 Chronic obstructive pulmonary disease, unspecified: Secondary | ICD-10-CM | POA: Diagnosis not present

## 2016-11-07 DIAGNOSIS — I252 Old myocardial infarction: Secondary | ICD-10-CM | POA: Diagnosis not present

## 2016-11-07 DIAGNOSIS — Z452 Encounter for adjustment and management of vascular access device: Secondary | ICD-10-CM | POA: Diagnosis not present

## 2016-11-07 DIAGNOSIS — J8410 Pulmonary fibrosis, unspecified: Secondary | ICD-10-CM | POA: Diagnosis not present

## 2016-11-07 DIAGNOSIS — I10 Essential (primary) hypertension: Secondary | ICD-10-CM | POA: Diagnosis not present

## 2016-11-07 DIAGNOSIS — J45909 Unspecified asthma, uncomplicated: Secondary | ICD-10-CM | POA: Diagnosis not present

## 2016-11-07 DIAGNOSIS — R7303 Prediabetes: Secondary | ICD-10-CM | POA: Diagnosis not present

## 2016-11-07 DIAGNOSIS — R7881 Bacteremia: Secondary | ICD-10-CM | POA: Diagnosis not present

## 2016-11-07 DIAGNOSIS — I2510 Atherosclerotic heart disease of native coronary artery without angina pectoris: Secondary | ICD-10-CM | POA: Diagnosis not present

## 2016-11-07 DIAGNOSIS — B9561 Methicillin susceptible Staphylococcus aureus infection as the cause of diseases classified elsewhere: Secondary | ICD-10-CM | POA: Diagnosis not present

## 2016-11-07 DIAGNOSIS — G20 Parkinson's disease: Secondary | ICD-10-CM | POA: Diagnosis not present

## 2016-11-07 NOTE — Telephone Encounter (Signed)
Referral has been faxed.  Cecille Rubin will you please mark referral as completed as I do not have access to do so.

## 2016-11-07 NOTE — Telephone Encounter (Signed)
[facility] called checking on the referral that was being sent over for the pt. There is a note in the referral that says message sent to [facility].. Do you know the status of this referral?

## 2016-11-09 ENCOUNTER — Other Ambulatory Visit: Payer: Medicare Other

## 2016-11-09 ENCOUNTER — Telehealth: Payer: Self-pay | Admitting: Emergency Medicine

## 2016-11-09 ENCOUNTER — Encounter: Payer: Self-pay | Admitting: Family Medicine

## 2016-11-09 ENCOUNTER — Ambulatory Visit (HOSPITAL_COMMUNITY): Payer: Medicare Other

## 2016-11-09 DIAGNOSIS — L03115 Cellulitis of right lower limb: Secondary | ICD-10-CM | POA: Diagnosis not present

## 2016-11-09 DIAGNOSIS — B9561 Methicillin susceptible Staphylococcus aureus infection as the cause of diseases classified elsewhere: Secondary | ICD-10-CM | POA: Diagnosis not present

## 2016-11-09 DIAGNOSIS — R7881 Bacteremia: Secondary | ICD-10-CM | POA: Diagnosis not present

## 2016-11-09 DIAGNOSIS — J8410 Pulmonary fibrosis, unspecified: Secondary | ICD-10-CM | POA: Diagnosis not present

## 2016-11-09 DIAGNOSIS — J449 Chronic obstructive pulmonary disease, unspecified: Secondary | ICD-10-CM | POA: Diagnosis not present

## 2016-11-09 DIAGNOSIS — H6121 Impacted cerumen, right ear: Secondary | ICD-10-CM | POA: Diagnosis not present

## 2016-11-09 DIAGNOSIS — G20 Parkinson's disease: Secondary | ICD-10-CM | POA: Diagnosis not present

## 2016-11-09 NOTE — Telephone Encounter (Signed)
[facility] called and asked if they can get an extension for PT 1 time a wk for 3 weeks. It is for stamina and balance. Please advise thanks.

## 2016-11-09 NOTE — Telephone Encounter (Signed)
Spoke with Bradley Bennett to give verbal orders per MD 

## 2016-11-10 DIAGNOSIS — R531 Weakness: Secondary | ICD-10-CM | POA: Diagnosis not present

## 2016-11-14 ENCOUNTER — Ambulatory Visit (HOSPITAL_COMMUNITY): Payer: Medicare Other

## 2016-11-14 DIAGNOSIS — G20 Parkinson's disease: Secondary | ICD-10-CM | POA: Diagnosis not present

## 2016-11-14 DIAGNOSIS — J449 Chronic obstructive pulmonary disease, unspecified: Secondary | ICD-10-CM | POA: Diagnosis not present

## 2016-11-14 DIAGNOSIS — J8410 Pulmonary fibrosis, unspecified: Secondary | ICD-10-CM | POA: Diagnosis not present

## 2016-11-14 DIAGNOSIS — B9561 Methicillin susceptible Staphylococcus aureus infection as the cause of diseases classified elsewhere: Secondary | ICD-10-CM | POA: Diagnosis not present

## 2016-11-14 DIAGNOSIS — R7881 Bacteremia: Secondary | ICD-10-CM | POA: Diagnosis not present

## 2016-11-14 DIAGNOSIS — L03115 Cellulitis of right lower limb: Secondary | ICD-10-CM | POA: Diagnosis not present

## 2016-11-16 ENCOUNTER — Ambulatory Visit (HOSPITAL_COMMUNITY): Payer: Medicare Other

## 2016-11-16 ENCOUNTER — Telehealth: Payer: Self-pay | Admitting: Pulmonary Disease

## 2016-11-16 NOTE — Telephone Encounter (Signed)
Spoke with Janett Billow, she needs the last couple of OV's on the patient. I faxed the last 2 OV's to her at  [phone]

## 2016-11-17 DIAGNOSIS — L03115 Cellulitis of right lower limb: Secondary | ICD-10-CM | POA: Diagnosis not present

## 2016-11-17 DIAGNOSIS — J8410 Pulmonary fibrosis, unspecified: Secondary | ICD-10-CM | POA: Diagnosis not present

## 2016-11-17 DIAGNOSIS — B9561 Methicillin susceptible Staphylococcus aureus infection as the cause of diseases classified elsewhere: Secondary | ICD-10-CM | POA: Diagnosis not present

## 2016-11-17 DIAGNOSIS — G20 Parkinson's disease: Secondary | ICD-10-CM | POA: Diagnosis not present

## 2016-11-17 DIAGNOSIS — J449 Chronic obstructive pulmonary disease, unspecified: Secondary | ICD-10-CM | POA: Diagnosis not present

## 2016-11-17 DIAGNOSIS — R7881 Bacteremia: Secondary | ICD-10-CM | POA: Diagnosis not present

## 2016-11-20 ENCOUNTER — Telehealth: Payer: Self-pay | Admitting: Internal Medicine

## 2016-11-20 MED ORDER — CEFDINIR 300 MG PO CAPS: 300.0000 mg | ORAL_CAPSULE | Freq: Two times a day (BID) | ORAL | 0 refills | Status: DC

## 2016-11-20 NOTE — Telephone Encounter (Signed)
States that patient has had cough since Thursday of last week.  States that patient is coughing up yellow sputum.  States started as head congestion but has moved into chest congestion.  Would like to know if [facility] would call in an antibiotic to [facility] [address].  Patient did have home health nurse/ [facility] out on Friday and was evaluated by nurse.  States home health nurse is coming back 10/16 but daughter states she would prefer not to wait.

## 2016-11-20 NOTE — Telephone Encounter (Signed)
Spoke with pts daughter to inform.  

## 2016-11-20 NOTE — Telephone Encounter (Signed)
Antibiotic sent to pharmacy.  

## 2016-11-21 ENCOUNTER — Telehealth (INDEPENDENT_AMBULATORY_CARE_PROVIDER_SITE_OTHER): Payer: Self-pay | Admitting: Orthopaedic Surgery

## 2016-11-21 ENCOUNTER — Ambulatory Visit (HOSPITAL_COMMUNITY): Payer: Medicare Other

## 2016-11-21 DIAGNOSIS — R7881 Bacteremia: Secondary | ICD-10-CM | POA: Diagnosis not present

## 2016-11-21 DIAGNOSIS — J8410 Pulmonary fibrosis, unspecified: Secondary | ICD-10-CM | POA: Diagnosis not present

## 2016-11-21 DIAGNOSIS — B9561 Methicillin susceptible Staphylococcus aureus infection as the cause of diseases classified elsewhere: Secondary | ICD-10-CM | POA: Diagnosis not present

## 2016-11-21 DIAGNOSIS — J449 Chronic obstructive pulmonary disease, unspecified: Secondary | ICD-10-CM | POA: Diagnosis not present

## 2016-11-21 DIAGNOSIS — L03115 Cellulitis of right lower limb: Secondary | ICD-10-CM | POA: Diagnosis not present

## 2016-11-21 DIAGNOSIS — G20 Parkinson's disease: Secondary | ICD-10-CM | POA: Diagnosis not present

## 2016-11-21 NOTE — Telephone Encounter (Signed)
Elaine-nurse with [facility] called needing [facility] care orders faxed to her. The fax # is [phone]  The ph# is [phone]

## 2016-11-22 ENCOUNTER — Encounter (INDEPENDENT_AMBULATORY_CARE_PROVIDER_SITE_OTHER): Payer: Self-pay | Admitting: Orthopaedic Surgery

## 2016-11-22 ENCOUNTER — Ambulatory Visit (INDEPENDENT_AMBULATORY_CARE_PROVIDER_SITE_OTHER): Payer: Medicare Other | Admitting: Orthopaedic Surgery

## 2016-11-22 ENCOUNTER — Telehealth: Payer: Self-pay | Admitting: Family Medicine

## 2016-11-22 VITALS — BP 107/60 | HR 88

## 2016-11-22 DIAGNOSIS — I2510 Atherosclerotic heart disease of native coronary artery without angina pectoris: Secondary | ICD-10-CM

## 2016-11-22 DIAGNOSIS — B9561 Methicillin susceptible Staphylococcus aureus infection as the cause of diseases classified elsewhere: Secondary | ICD-10-CM

## 2016-11-22 DIAGNOSIS — R7881 Bacteremia: Secondary | ICD-10-CM

## 2016-11-22 NOTE — Telephone Encounter (Signed)
Discussed with pt's daughter

## 2016-11-22 NOTE — Telephone Encounter (Signed)
Yes that is fine

## 2016-11-22 NOTE — Telephone Encounter (Signed)
Daughter called wanting to make sure it was still ok for her dad the patient to have the epidural injection. He had a cough and was on abx. He is doing better now. Follow up with daughter. Thank you.

## 2016-11-22 NOTE — Progress Notes (Signed)
Office Visit Note   Patient: Bradley Bennett           Date of Birth: 1932/02/24           MRN: [id number] Visit Date: 11/22/2016              Requested by: [name], MD [place], [place] [id number] PCP: [name], MD   Assessment & Plan: Visit Diagnoses:  1. Bacteremia due to methicillin susceptible Staphylococcus aureus (MSSA)      With right great toe cellulitis treated with IV antibiotics and then by mouth antibiotics now off antibiotics.  Plan: Central area granulation tissue is treated with silver nitrate sticks. Using 10 blade and sharp excisional debridement of callus was performed around the area particularly over the plantar surface was gotten hard and is causing continued serous drainage along the edge. No purulence is expressed and his cellulitis has resolved. They can continue putting some daily peroxide on it and I'll recheck him in 2 weeks. Hyperkeratotic skin was excised with 10 scalpel blade and dressing was applied.  Follow-Up Instructions: Return in about 2 weeks (around 12/06/2016).   Orders:  No orders of the defined types were placed in this encounter.  No orders of the defined types were placed in this encounter.     Procedures: No procedures performed   Clinical Data: No additional findings.   Subjective: Chief Complaint  Patient presents with  . Right Great Toe - Follow-up    HPI  83 year old male returns C had MRSA infection right great toe with hospitalization IV antibiotics some skin debridement and now currently they stop mupirocin and daughter is applying just straight peroxide. There is some hard buildup callus around the edge and is continued have serous drainage. No fever chills. He's on oxygen has significant heart disease, hypoxia or pulmonary fibrosis with multiple medical problems including Parkinson's. He ambulates in his apartment uses a walker and uses the electric scooter.  Review of Systems he is systems  unchanged from last office visit no chills or fever chill slight serous drainage with daily dressing changes of his foot.   Objective: Vital Signs: BP 107/60   Pulse 88   Physical Exam  Constitutional: He is oriented to person, place, and time. He appears well-developed and well-nourished.  HENT:  Head: Normocephalic and atraumatic.  Eyes: Pupils are equal, round, and reactive to light. EOM are normal.  Neck: No tracheal deviation present. No thyromegaly present.  Cardiovascular: Normal rate.   Pulmonary/Chest: He has no wheezes.  Patient's on oxygen minimal respiratory distress. No audible wheezing.  Abdominal: Soft. Bowel sounds are normal.  Neurological: He is alert and oriented to person, place, and time.  Skin: Skin is warm and dry. Capillary refill takes less than 2 seconds.  Psychiatric: He has a normal mood and affect. His behavior is normal. Judgment and thought content normal.    Ortho Exam great toe shows a 3.8 x 2 cm oval area with granulation tissue present and firm hard callus around the edge.  Specialty Comments:  No specialty comments available.  Imaging: No results found.   PMFS History: Patient Active Problem List   Diagnosis Date Noted  . Hypokalemia 10/24/2016  . Bacteremia due to methicillin susceptible Staphylococcus aureus (MSSA) 10/20/2016  . Hyponatremia 10/20/2016  . Cellulitis 10/19/2016  . Sepsis ([place]) 10/19/2016  . Trigger point of right shoulder region 10/05/2016  . Cervical radiculopathy 09/15/2016  . Chronic respiratory failure ([place]) 08/21/2016  .  Chronic right shoulder pain 08/21/2016  . Hypotension 07/15/2016  . Diarrhea 07/14/2016  . Prediabetes 07/13/2016  . Dyspnea 06/15/2016  . Allergic rhinitis 05/25/2016  . COPD exacerbation ([place]) 05/10/2016  . Trigger thumb of both hands 01/15/2016  . COPD (chronic obstructive pulmonary disease) ([place]) 07/16/2015  . Hypoxemia 07/16/2015  . Postinflammatory pulmonary fibrosis ([place]) 07/16/2015    . Parkinson disease ([place]) 07/16/2015  . Hypothyroidism 07/16/2015  . Insomnia 07/16/2015  . BPH (benign prostatic hypertrophy) 07/16/2015  . Anxiety 07/16/2015  . Carpal tunnel syndrome, right 07/16/2015  . Chronic lower back pain 07/16/2015  . Macular degeneration 07/16/2015  . CAD in native artery 06/15/2015  . 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 S/P coronary artery stent placement 06/15/2015  . Hyperlipidemia 06/15/2015   Past Medical History:  Diagnosis Date  . Arthritis   . Asthma   . Coronary artery disease   . Emphysema of lung ([place])   . Hearing difficulty of both ears   . Heart attack ([place])   . Heart murmur   . Hyperlipidemia   . Hypertension   . ILD (interstitial lung disease) ([place])   . Parkinson disease ([place])     Family History  Problem Relation Age of Onset  . Colon cancer Father   . Stroke Father   . Arthritis Mother   . Arthritis Sister   . Heart disease Brother   . Kidney cancer Brother     Past Surgical History:  Procedure Laterality Date  . CARPAL TUNNEL RELEASE Right 03/2014  . CORONARY ANGIOPLASTY WITH STENT PLACEMENT    . CORONARY ARTERY BYPASS GRAFT  1992  . HERNIA REPAIR  10/2013   x3   . VEIN BYPASS SURGERY     Social History   Occupational History  . retired     [place] History Main Topics  . Smoking status: Former Smoker    Packs/day: 2.00    Years: 35.00    Quit date: 08/15/1985  . Smokeless tobacco: Never Used     Comment: quit smoking in 1992  . Alcohol use 0.0 oz/week     Comment: twice every 6 months  . Drug use: No  . Sexual activity: No

## 2016-11-22 NOTE — Telephone Encounter (Signed)
Please advise. Do you want me to have home health do peroxide and dry dressing as well?

## 2016-11-22 NOTE — Telephone Encounter (Signed)
Ucall. I think daughter was doing the dressing changes now so they can stop HH . ucall thanks

## 2016-11-23 ENCOUNTER — Ambulatory Visit: Payer: Medicare Other | Admitting: Pulmonary Disease

## 2016-11-23 ENCOUNTER — Ambulatory Visit (HOSPITAL_COMMUNITY): Payer: Medicare Other

## 2016-11-23 ENCOUNTER — Ambulatory Visit
Admission: RE | Admit: 2016-11-23 | Discharge: 2016-11-23 | Disposition: A | Discharge status: Home / Self Care | Payer: Medicare Other | Source: Ambulatory Visit | Attending: Family Medicine | Admitting: Family Medicine

## 2016-11-23 DIAGNOSIS — M47812 Spondylosis without myelopathy or radiculopathy, cervical region: Secondary | ICD-10-CM | POA: Diagnosis not present

## 2016-11-23 DIAGNOSIS — M5412 Radiculopathy, cervical region: Secondary | ICD-10-CM

## 2016-11-23 MED ORDER — TRIAMCINOLONE ACETONIDE 40 MG/ML IJ SUSP (RADIOLOGY)
60.0000 mg | Freq: Once | INTRAMUSCULAR | Status: AC
  Administered 2016-11-23: 60 mg via EPIDURAL

## 2016-11-23 MED ORDER — IOPAMIDOL (ISOVUE-M 300) INJECTION 61%
1.0000 mL | Freq: Once | INTRAMUSCULAR | Status: AC | PRN
  Administered 2016-11-23: 1 mL via EPIDURAL

## 2016-11-23 NOTE — Discharge Instructions (Signed)

## 2016-11-24 ENCOUNTER — Encounter: Payer: Self-pay | Admitting: Internal Medicine

## 2016-11-25 DIAGNOSIS — G20 Parkinson's disease: Secondary | ICD-10-CM | POA: Diagnosis not present

## 2016-11-25 DIAGNOSIS — R7881 Bacteremia: Secondary | ICD-10-CM | POA: Diagnosis not present

## 2016-11-25 DIAGNOSIS — B9561 Methicillin susceptible Staphylococcus aureus infection as the cause of diseases classified elsewhere: Secondary | ICD-10-CM | POA: Diagnosis not present

## 2016-11-25 DIAGNOSIS — L03115 Cellulitis of right lower limb: Secondary | ICD-10-CM | POA: Diagnosis not present

## 2016-11-25 DIAGNOSIS — J8410 Pulmonary fibrosis, unspecified: Secondary | ICD-10-CM | POA: Diagnosis not present

## 2016-11-25 DIAGNOSIS — J449 Chronic obstructive pulmonary disease, unspecified: Secondary | ICD-10-CM | POA: Diagnosis not present

## 2016-11-27 NOTE — Telephone Encounter (Signed)
I left message for return call. 

## 2016-11-28 ENCOUNTER — Ambulatory Visit (HOSPITAL_COMMUNITY): Payer: Medicare Other

## 2016-11-28 DIAGNOSIS — R7881 Bacteremia: Secondary | ICD-10-CM | POA: Diagnosis not present

## 2016-11-28 DIAGNOSIS — G20 Parkinson's disease: Secondary | ICD-10-CM | POA: Diagnosis not present

## 2016-11-28 DIAGNOSIS — J449 Chronic obstructive pulmonary disease, unspecified: Secondary | ICD-10-CM | POA: Diagnosis not present

## 2016-11-28 DIAGNOSIS — J8410 Pulmonary fibrosis, unspecified: Secondary | ICD-10-CM | POA: Diagnosis not present

## 2016-11-28 DIAGNOSIS — L03115 Cellulitis of right lower limb: Secondary | ICD-10-CM | POA: Diagnosis not present

## 2016-11-28 DIAGNOSIS — B9561 Methicillin susceptible Staphylococcus aureus infection as the cause of diseases classified elsewhere: Secondary | ICD-10-CM | POA: Diagnosis not present

## 2016-11-29 ENCOUNTER — Telehealth (INDEPENDENT_AMBULATORY_CARE_PROVIDER_SITE_OTHER): Payer: Self-pay | Admitting: Orthopaedic Surgery

## 2016-11-29 NOTE — Telephone Encounter (Signed)
[facility]needs orders for the wound care. Please call with ordes

## 2016-11-30 ENCOUNTER — Ambulatory Visit: Payer: Medicare Other | Admitting: Neurology

## 2016-11-30 ENCOUNTER — Ambulatory Visit (HOSPITAL_COMMUNITY): Payer: Medicare Other

## 2016-12-01 ENCOUNTER — Other Ambulatory Visit: Payer: Self-pay | Admitting: Pulmonary Disease

## 2016-12-01 NOTE — Telephone Encounter (Signed)
No return number to reach [facility].   I spoke with patient's daughter, Cecille Rubin, who is doing his daily dressing changes. [facility] is no longer taking care of his dressing changes.

## 2016-12-01 NOTE — Progress Notes (Signed)
Bradley Bennett was seen today in the movement disorders clinic for neurologic consultation at the request of Burns, [name], MD.  The consultation is for the evaluation of PD.  This patient is accompanied in the office by his child who supplements the history.  Pt just moved here from [place].  I don't have any previous neurology records.  Pt reports that he was dx with PD about 4 years ago per pt and about 10 years ago per daughter.  His first sx was L hand tremor.  Daughter states he was on something else prior to the levodopa but she isn't sure that it was specifically for PD.  He is currently on carbidopa/levodopa 25/100, 1.5 tablets tid (8:30am/5:30/9pm).  Daughter thinks that the med wears off   11/16/15 update: The patient follows up today, accompanied by his daughter who supplements the history.  Last visit, we talked about changing his dosing of his medication, so that he takes carbidopa/levodopa 25/100, 2 tablets at 8 AM/noon/4 PM and then we added carbidopa/levodopa 50/200 at bedtime.  Daughter thinks that it helps.  He had an MRI of the brain and cervical spine since our last visit.  The MRI of the brain demonstrated mild to moderate white matter disease.  The MRI of the cervical spine demonstrated degenerative changes and spinal stenosis, but nothing surgical noted.  He has not had any falls since last visit.  No hallucinations.  No lightheadedness or near syncope.  He is using melatonin, 10 mg, for sleep and trazodone for sleep.  Doing pulm rehab on tues/thurs but daughter states that more active on the other days.  He is doing Wii bowling.  No hallucinations.  He lives in independent assisted living but he does his own medication.  His daughter prepares the pill box and he has no trouble remembering to take the medication.    03/24/16 update:  Patient follows up today, accompanied by his daughter supplements the history.  He is on carbidopa/levodopa 25/100, 2 tablet at 8 AM/noon/4 PM and  carbidopa/levodopa 50/200 at bedtime.  Noting some tremor in the leg.  Daughter fills his pill box and noting a lot of extra pills at the end of the week.  Patient admits that he forgot the middle of the day dose today (supposed to take at noon and it is 3pm).  He has had no falls.  No hallucinations.  No lightheadedness or near syncope.  Not able to exercise because of pulmonary constraints.  In pulmonary rehab and reviewed those records.  He remains in independent assisted living. Daughter quietly mentions to me that Dr. Lake Bells mentioned hospice.  Noting paresthesias of the hands and feet.    08/23/16 update: Patient seen today in follow-up, accompanied by his daughter who supplements the history.  Patient is on carbidopa/levodopa 25/100, 2 tablets at 8 AM/noon/4 PM and carbidopa/levodopa 50/200 at bed. Has been remembering to take meds.  Has been experiencing low blood pressure and saw PCP on 08/21/16 for this.   The records that were made available to me were reviewed.  Has been attending pulm rehab.  His O2 goes down to 70% there.  His daughter is trying to get him to accept hospice.  He is not ready to do that yet.  12/04/16 update: Patient is seen today in follow-up, accompanied by his daughter who supplements the history.  The patient is on carbidopa/levodopa 25/100, 2 tablets at 8 AM/noon/4 p.m.  He is on carbidopa/levodopa 50/200 at bedtime.  Pt denies falls.  Pt denies lightheadedness, near syncope.  No hallucinations.  Mood has been good.  In sept, he went into pulm arrest and required bag valve mask ventilation and then came back with sepsis.  The records that were made available to me were reviewed.  He has had more tremor.  Daughter states that not taking more lung meds.  He has not tried more levodopa.  His memory ha suffered.  He is living in independent assisted living but daughter is there all the time.  Daughter has applied for VA benefits to bring in some help for management of meds/O2.   He  lives at [facility].  Just finished PT and that helped.  He now has a scooter as he has the farthest apartment from the dining hall and he uses all the o2 just getting to the dining room.  No hallucinations.  Not choking on foods but slow to swallow and therefore taking in less calories.  PREVIOUS MEDICATIONS: Sinemet  ALLERGIES:   Allergies  Allergen Reactions  . Isosorbide Other (See Comments)    Reaction:  Headaches     CURRENT MEDICATIONS:  Outpatient Encounter Prescriptions as of 12/04/2016  Medication Sig  . acetaminophen (TYLENOL) 500 MG tablet Take 500-1,000 mg by mouth every 6 (six) hours as needed for mild pain, moderate pain, fever or headache.   . ADVAIR DISKUS 500-50 MCG/DOSE AEPB INHALE ONE PUFF BY MOUTH INTO THE LUNGS TWO TIMES A DAY  . alfuzosin (UROXATRAL) 10 MG 24 hr tablet Take 1 tablet (10 mg total) by mouth daily.  Marland Kitchen atorvastatin (LIPITOR) 20 MG tablet Take 1 tablet (20 mg total) by mouth daily.  . busPIRone (BUSPAR) 5 MG tablet Take 1 tablet (5 mg total) by mouth 2 (two) times daily.  . carbidopa-levodopa (SINEMET CR) 50-200 MG tablet TAKE ONE TABLET BY MOUTH AT BEDTIME  . carbidopa-levodopa (SINEMET IR) 25-100 MG tablet Take 2 tablets by mouth 4 (four) times daily.  . clopidogrel (PLAVIX) 75 MG tablet TAKE ONE TABLET BY MOUTH DAILY (Patient taking differently: TAKE 75MG BY MOUTH DAILY)  . finasteride (PROSCAR) 5 MG tablet Take 1 tablet (5 mg total) by mouth every evening.  . fluticasone (FLONASE) 50 MCG/ACT nasal spray Place 1 spray into both nostrils daily.  Marland Kitchen gabapentin (NEURONTIN) 100 MG capsule 134m at 8am and 4pm  . gabapentin (NEURONTIN) 400 MG capsule Take 1 capsule (400 mg total) by mouth at bedtime.  . INCRUSE ELLIPTA 62.5 MCG/INH AEPB INHALE ONE PUFF INTO THE LUNGS DAILY  . lactose free nutrition (BOOST PLUS) LIQD Take 237 mLs by mouth 3 (three) times daily with meals.  .Marland Kitchenlevothyroxine (SYNTHROID, LEVOTHROID) 150 MCG tablet TAKE ONE TABLET BY MOUTH  DAILY (Patient taking differently: TAKE 150MG BY MOUTH DAILY)  . mirabegron ER (MYRBETRIQ) 50 MG TB24 tablet Take 50 mg by mouth every evening.  . Multiple Vitamin (MULTIVITAMIN WITH MINERALS) TABS tablet Take 1 tablet by mouth daily at 12 noon.  . Multiple Vitamins-Minerals (ICAPS AREDS 2 PO) Take 1-2 tablets by mouth See admin instructions. 2 tablets @ noon and 1 tablet '@dinner'   . mupirocin ointment (BACTROBAN) 2 % Place 1 application into the nose 2 (two) times daily.  . sodium chloride (OCEAN) 0.65 % SOLN nasal spray Place 1 spray into both nostrils as needed for congestion.  . traZODone (DESYREL) 100 MG tablet TAKE ONE TABLET BY MOUTH AT BEDTIME  . [DISCONTINUED] carbidopa-levodopa (SINEMET IR) 25-100 MG tablet TAKE TWO TABLETS BY MOUTH THREE  TIMES A DAY  . [DISCONTINUED] ceFAZolin (ANCEF) IVPB Inject 2 g into the vein every 8 (eight) hours. Indication: MSSA bacteremia Last Day of Therapy:  11/03/16 Labs - Once weekly:  CBC/D and BMP, Labs - Every other week:  ESR and CRP  . [DISCONTINUED] cefdinir (OMNICEF) 300 MG capsule Take 1 capsule (300 mg total) by mouth 2 (two) times daily.   No facility-administered encounter medications on file as of 12/04/2016.     PAST MEDICAL HISTORY:   Past Medical History:  Diagnosis Date  . Arthritis   . Asthma   . Coronary artery disease   . Emphysema of lung ([place])   . Hearing difficulty of both ears   . Heart attack ([place])   . Heart murmur   . Hyperlipidemia   . Hypertension   . ILD (interstitial lung disease) ([place])   . Parkinson disease ([place])     PAST SURGICAL HISTORY:   Past Surgical History:  Procedure Laterality Date  . CARPAL TUNNEL RELEASE Right 03/2014  . CORONARY ANGIOPLASTY WITH STENT PLACEMENT    . CORONARY ARTERY BYPASS GRAFT  1992  . HERNIA REPAIR  10/2013   x3   . VEIN BYPASS SURGERY      SOCIAL HISTORY:   Social History   Social History  . Marital status: Divorced    Spouse name: N/A  . Number of children: N/A  .  Years of education: N/A   Occupational History  . retired     [place] History Main Topics  . Smoking status: Former Smoker    Packs/day: 2.00    Years: 35.00    Quit date: 08/15/1985  . Smokeless tobacco: Never Used     Comment: quit smoking in 1992  . Alcohol use 0.0 oz/week     Comment: twice every 6 months  . Drug use: No  . Sexual activity: No   Other Topics Concern  . Not on file   Social History Narrative  . No narrative on file    FAMILY HISTORY:   Family Status  Relation Status  . Father Deceased       stroke, colon cancer  . Mother Deceased       arthritis, ruptured spleen  . Brother Deceased       MI, DM, kidney cancer  . Sister Alive       lupus  . Sister Alive       heart disease, DM  . Daughter Alive       2, healthy  . Son Alive       1, healthy  . Sister (Not Specified)  . Brother (Not Specified)    ROS:  A complete 10 system review of systems was obtained and was unremarkable apart from what is mentioned above.  PHYSICAL EXAMINATION:    VITALS:   Vitals:   12/04/16 1521  BP: 134/60  Pulse: 76  SpO2: 96%     GEN:  The patient appears stated age and is in NAD. HEENT:  Normocephalic, atraumatic.  The mucous membranes are moist. The superficial temporal arteries are without ropiness or tenderness. CV:  RRR Lungs:  CTAB.  He is wearing O2 Neck/HEME:  There are no carotid bruits bilaterally.  Neurological examination:  Orientation:  Montreal Cognitive Assessment  08/16/2015  Visuospatial/ Executive (0/5) 5  Naming (0/3) 3  Attention: Read list of digits (0/2) 2  Attention: Read list of letters (0/1) 1  Attention: Serial 7 subtraction starting at 100 (  0/3) 3  Language: Repeat phrase (0/2) 2  Language : Fluency (0/1) 1  Abstraction (0/2) 2  Delayed Recall (0/5) 2  Orientation (0/6) 6  Total 27  Adjusted Score (based on education) 27   Cranial nerves: There is good facial symmetry.  The visual fields are full to  confrontational testing. The speech is fluent and clear. Soft palate rises symmetrically and there is no tongue deviation. Hearing is decreased to conversational tone. Sensation: Sensation is intact to light touch throughout. Motor: Strength is 5/5 in the bilateral upper and lower extremities.   Shoulder shrug is equal and symmetric.  There is no pronator drift. Deep tendon reflexes: Deep tendon reflexes are 2+-3/4 at the bilateral biceps, triceps, brachioradialis,   Movement examination: Tone: There is no increased tone in the UE's.     The tone in the lower extremities is normal.  Abnormal movements: There is mod LUE/LLE resting tremor. Coordination:  There is decreased foot taps only Gait and Station: did not formally assess because he no longer ambulates much because of hypoxia and shortness of breath  ASSESSMENT/PLAN:  1.  idiopathic Parkinson's disease.  The patient has tremor, bradykinesia, rigidity and Minimal postural instability.  -Increase carbidopa/levodopa 25/100, so that he takes 2 tablets at 7 AM/11 AM/3 PM/7 PM  -He will continue carbidopa/levodopa 50/200 at bedtime.  -He really cannot exercise because of pulmonary disease, but he doesn't wish to pursue hospice yet.    -Met with our social worker to discuss resource options.  He is trying to use resources through the [facility]  2.  Hyperreflexia  -MRI of the brain just demonstrated mild to moderate white matter disease and MRI the cervical spine demonstrates degenerative changes, but no cervical surgical lesions.  3.  Dysphagia with aspiration  -Per pulm records, pt does have a hx of dysphagia with chronic aspiration/post inflammatory pulm fibrosis but refuses thickening agents.  His last swallow study per the patient was over a year ago, but if he is unwilling to follow recommendations, there is no use repeating it.  Overall, the patient does not feel that he is aspirating or has significant dysphagia any longer, but  he does think it is taking him a long time to eat and thinks that he has trouble getting in calories because of this.  We talked about trying to use Ensure or boost so long as he keeps it away from timing of levodopa.  -pt is DNR  4.  Orthostasis  -Likely multifactorial.  His Flomax may be contributing and we discussed this in detail today.  He is going to contact his urologist.  -Discussed the importance of increasing his water intake.  He is drinking almost none currently.  -If the above methods do not work, then we can certainly try something to raise up his blood pressure.  We decided to hold off on that for now.  We did talk about the abdominal compression binder.  We talked about medications as well.  5.  Hypoxia secondary to lung disease  -He is using oxygen and now has a scooter to get around.  6.   Follow up is anticipated in the next few months, sooner should new neurologic issues arise.  Much greater than 50% of this visit was spent in counseling and coordinating care.  Total face to face time:  25 min

## 2016-12-04 ENCOUNTER — Ambulatory Visit: Payer: Medicare Other | Admitting: Adult Health

## 2016-12-04 ENCOUNTER — Ambulatory Visit (INDEPENDENT_AMBULATORY_CARE_PROVIDER_SITE_OTHER): Payer: Medicare Other | Admitting: Neurology

## 2016-12-04 ENCOUNTER — Encounter: Payer: Self-pay | Admitting: Neurology

## 2016-12-04 ENCOUNTER — Encounter: Payer: Self-pay | Admitting: Psychology

## 2016-12-04 VITALS — BP 134/60 | HR 76

## 2016-12-04 DIAGNOSIS — G20 Parkinson's disease: Secondary | ICD-10-CM

## 2016-12-04 DIAGNOSIS — R1319 Other dysphagia: Secondary | ICD-10-CM | POA: Diagnosis not present

## 2016-12-04 DIAGNOSIS — I2510 Atherosclerotic heart disease of native coronary artery without angina pectoris: Secondary | ICD-10-CM | POA: Diagnosis not present

## 2016-12-04 MED ORDER — CARBIDOPA-LEVODOPA 25-100 MG PO TABS: 2.0000 | ORAL_TABLET | Freq: Four times a day (QID) | ORAL | 1 refills | Status: DC

## 2016-12-04 NOTE — Progress Notes (Signed)
Patient lives in independent living at [facility]. He needs more care and his daughter has applied for Aide and Assist through the [place]. I provided them with  Serves for the [employer] as this resource can help veterans find necessary community resources and helps them through the application process, etc.

## 2016-12-04 NOTE — Patient Instructions (Signed)
1. Carbidopa Levodopa 25/100 IR: take two tablets at 7am, 11 am, 3 pm, and 7 pm

## 2016-12-05 ENCOUNTER — Ambulatory Visit (HOSPITAL_COMMUNITY): Payer: Medicare Other

## 2016-12-05 DIAGNOSIS — B9561 Methicillin susceptible Staphylococcus aureus infection as the cause of diseases classified elsewhere: Secondary | ICD-10-CM | POA: Diagnosis not present

## 2016-12-05 DIAGNOSIS — G20 Parkinson's disease: Secondary | ICD-10-CM | POA: Diagnosis not present

## 2016-12-05 DIAGNOSIS — L03115 Cellulitis of right lower limb: Secondary | ICD-10-CM | POA: Diagnosis not present

## 2016-12-05 DIAGNOSIS — R7881 Bacteremia: Secondary | ICD-10-CM | POA: Diagnosis not present

## 2016-12-05 DIAGNOSIS — J8410 Pulmonary fibrosis, unspecified: Secondary | ICD-10-CM | POA: Diagnosis not present

## 2016-12-05 DIAGNOSIS — J449 Chronic obstructive pulmonary disease, unspecified: Secondary | ICD-10-CM | POA: Diagnosis not present

## 2016-12-06 ENCOUNTER — Encounter (INDEPENDENT_AMBULATORY_CARE_PROVIDER_SITE_OTHER): Payer: Self-pay | Admitting: Orthopaedic Surgery

## 2016-12-06 ENCOUNTER — Ambulatory Visit (INDEPENDENT_AMBULATORY_CARE_PROVIDER_SITE_OTHER): Payer: Medicare Other | Admitting: Orthopaedic Surgery

## 2016-12-06 VITALS — BP 91/60 | HR 85

## 2016-12-06 DIAGNOSIS — R7881 Bacteremia: Secondary | ICD-10-CM

## 2016-12-06 DIAGNOSIS — I2510 Atherosclerotic heart disease of native coronary artery without angina pectoris: Secondary | ICD-10-CM

## 2016-12-06 DIAGNOSIS — B9561 Methicillin susceptible Staphylococcus aureus infection as the cause of diseases classified elsewhere: Secondary | ICD-10-CM

## 2016-12-07 ENCOUNTER — Ambulatory Visit (HOSPITAL_COMMUNITY): Payer: Medicare Other

## 2016-12-07 ENCOUNTER — Telehealth: Payer: Self-pay | Admitting: Cardiology

## 2016-12-07 DIAGNOSIS — R040 Epistaxis: Secondary | ICD-10-CM

## 2016-12-07 NOTE — Telephone Encounter (Signed)
Last PCI was in 2015 - ok to stop Plavix - needs also to see ENT

## 2016-12-07 NOTE — Telephone Encounter (Signed)
Spoke with patients daughter regarding his nose bleeds while on Plavix.  He is having these daily and she was wondering if she could stop Plavix.  I told her that I would forward to Dr. Radford Pax.

## 2016-12-07 NOTE — Telephone Encounter (Signed)
New message   Patient daughter calling, wanting to discuss stopping  Plavix. Please call  Pt c/o medication issue:  1. Name of Medication: clopidogrel (PLAVIX) 75 MG tablet  2. How are you currently taking this medication (dosage and times per day)? TAKE ONE TABLET BY MOUTH DAILY  3. Are you having a reaction (difficulty breathing--STAT)? No  4. What is your medication issue? Patient is having many nose bleeds.

## 2016-12-08 DIAGNOSIS — R040 Epistaxis: Secondary | ICD-10-CM | POA: Insufficient documentation

## 2016-12-08 NOTE — Telephone Encounter (Signed)
Spoke with patient's daughter regarding Dr. Theodosia Blender suggestion to discontinue Plavix and refer to ENT.  She verbalized understanding and will make decision about ENT appointment once she is contacted.

## 2016-12-12 ENCOUNTER — Ambulatory Visit (HOSPITAL_COMMUNITY): Payer: Medicare Other

## 2016-12-12 ENCOUNTER — Encounter (INDEPENDENT_AMBULATORY_CARE_PROVIDER_SITE_OTHER): Payer: Self-pay | Admitting: Orthopaedic Surgery

## 2016-12-12 NOTE — Progress Notes (Signed)
Office Visit Note   Patient: Bradley Bennett           Date of Birth: 1933-02-05           MRN: [id number] Visit Date: 12/06/2016              Requested by: [name], MD [place], [place] [address] PCP: [name], MD   Assessment & Plan: Visit Diagnoses:  1. Bacteremia due to methicillin susceptible Staphylococcus aureus (MSSA)      With great toe cellulitis-resolved  Plan: Some eschar and callus is debrided from around the area of his great toe using a sterile 10 scalpel blade with sharp excisional debridement of the eschar and callus.  Small dressing was applied.  There is granulation tissue present at the base where he had a blister.  He can follow-up as needed and again we discussed foot care with patient and his daughter they will call if they has any concerns.  Follow-Up Instructions: No Follow-up on file.   Orders:  No orders of the defined types were placed in this encounter.  No orders of the defined types were placed in this encounter.     Procedures: No procedures performed   Clinical Data: No additional findings.   Subjective: Chief Complaint  Patient presents with  . Right Great Toe - Follow-up    HPI patient returns for follow-up of great toe cellulitis with a purulent blister which was debrided while in the hospital he was treated for bacteremia MSSA.  He has had trace serous drainage there is granulation tissue present on the toe with hard callus around the edge. Patient's daughter is present with him today. Review of Systems review of systems is updated unchanged from 11/22/2016 office visit other than as mentioned in HPI.   Objective: Vital Signs: BP 91/60   Pulse 85   Physical Exam  Constitutional: He is oriented to person, place, and time. He appears well-developed and well-nourished.  HENT:  Head: Normocephalic and atraumatic.  Eyes: EOM are normal. Pupils are equal, round, and reactive to light.  Neck: No tracheal  deviation present. No thyromegaly present.  Cardiovascular: Normal rate.  Pulmonary/Chest: Effort normal. He has no wheezes.  Abdominal: Soft. Bowel sounds are normal.  Neurological: He is alert and oriented to person, place, and time.  Skin: Skin is warm and dry. Capillary refill takes less than 2 seconds.  Psychiatric: He has a normal mood and affect. His behavior is normal. Judgment and thought content normal.    Ortho Exam granulation bed no cellulitis of the great toe.  No swelling of the IP joint.  Extensor flexor function is intact.  There is callus and eschar around the edge of the area that had the blister.  This is hard and bothersome issue wearing.  Specialty Comments:  No specialty comments available.  Imaging: No results found.   PMFS History: Patient Active Problem List   Diagnosis Date Noted  . Bleeding from the nose 12/08/2016  . Hypokalemia 10/24/2016  . Bacteremia due to methicillin susceptible Staphylococcus aureus (MSSA) 10/20/2016  . Hyponatremia 10/20/2016  . Cellulitis 10/19/2016  . Sepsis ([place] ) 10/19/2016  . Trigger point of right shoulder region 10/05/2016  . Cervical radiculopathy 09/15/2016  . Chronic respiratory failure ([place]) 08/21/2016  . Chronic right shoulder pain 08/21/2016  . Hypotension 07/15/2016  . Diarrhea 07/14/2016  . Prediabetes 07/13/2016  . Dyspnea 06/15/2016  . Allergic rhinitis 05/25/2016  . COPD exacerbation ([place])  05/10/2016  . Trigger thumb of both hands 01/15/2016  . COPD (chronic obstructive pulmonary disease) ([place]) 07/16/2015  . Hypoxemia 07/16/2015  . Postinflammatory pulmonary fibrosis ([place]) 07/16/2015  . Parkinson disease ([place]) 07/16/2015  . Hypothyroidism 07/16/2015  . Insomnia 07/16/2015  . BPH (benign prostatic hypertrophy) 07/16/2015  . Anxiety 07/16/2015  . Carpal tunnel syndrome, right 07/16/2015  . Chronic lower back pain 07/16/2015  . Macular degeneration 07/16/2015  . CAD in native artery 06/15/2015  . S/P  CABG x 4 06/15/2015  . S/P coronary artery stent placement 06/15/2015  . Hyperlipidemia 06/15/2015   Past Medical History:  Diagnosis Date  . Arthritis   . Asthma   . Coronary artery disease   . Emphysema of lung ([place])   . Hearing difficulty of both ears   . Heart attack ([place])   . Heart murmur   . Hyperlipidemia   . Hypertension   . ILD (interstitial lung disease) ([place])   . Parkinson disease ([place])     Family History  Problem Relation Age of Onset  . Colon cancer Father   . Stroke Father   . Arthritis Mother   . Arthritis Sister   . Heart disease Brother   . Kidney cancer Brother     Past Surgical History:  Procedure Laterality Date  . CARPAL TUNNEL RELEASE Right 03/2014  . CORONARY ANGIOPLASTY WITH STENT PLACEMENT    . CORONARY ARTERY BYPASS GRAFT  1992  . HERNIA REPAIR  10/2013   x3   . VEIN BYPASS SURGERY     Social History   Occupational History  . Occupation: retired    Comment: [employer]  Tobacco Use  . Smoking status: Former Smoker    Packs/day: 2.00    Years: 35.00    Pack years: 70.00    Last attempt to quit: 08/15/1985    Years since quitting: 31.3  . Smokeless tobacco: Never Used  . Tobacco comment: quit smoking in 1992  Substance and Sexual Activity  . Alcohol use: Yes    Alcohol/week: 0.0 oz    Comment: twice every 6 months  . Drug use: No  . Sexual activity: No

## 2016-12-14 ENCOUNTER — Ambulatory Visit (HOSPITAL_COMMUNITY): Payer: Medicare Other

## 2016-12-14 DIAGNOSIS — G20 Parkinson's disease: Secondary | ICD-10-CM | POA: Diagnosis not present

## 2016-12-14 DIAGNOSIS — J449 Chronic obstructive pulmonary disease, unspecified: Secondary | ICD-10-CM | POA: Diagnosis not present

## 2016-12-14 DIAGNOSIS — J8410 Pulmonary fibrosis, unspecified: Secondary | ICD-10-CM | POA: Diagnosis not present

## 2016-12-14 DIAGNOSIS — B9561 Methicillin susceptible Staphylococcus aureus infection as the cause of diseases classified elsewhere: Secondary | ICD-10-CM | POA: Diagnosis not present

## 2016-12-14 DIAGNOSIS — L03115 Cellulitis of right lower limb: Secondary | ICD-10-CM | POA: Diagnosis not present

## 2016-12-14 DIAGNOSIS — R7881 Bacteremia: Secondary | ICD-10-CM | POA: Diagnosis not present

## 2016-12-19 ENCOUNTER — Ambulatory Visit (HOSPITAL_COMMUNITY): Payer: Medicare Other

## 2016-12-20 DIAGNOSIS — R7881 Bacteremia: Secondary | ICD-10-CM | POA: Diagnosis not present

## 2016-12-20 DIAGNOSIS — J449 Chronic obstructive pulmonary disease, unspecified: Secondary | ICD-10-CM | POA: Diagnosis not present

## 2016-12-20 DIAGNOSIS — L03115 Cellulitis of right lower limb: Secondary | ICD-10-CM | POA: Diagnosis not present

## 2016-12-20 DIAGNOSIS — G20 Parkinson's disease: Secondary | ICD-10-CM | POA: Diagnosis not present

## 2016-12-20 DIAGNOSIS — J8410 Pulmonary fibrosis, unspecified: Secondary | ICD-10-CM | POA: Diagnosis not present

## 2016-12-20 DIAGNOSIS — B9561 Methicillin susceptible Staphylococcus aureus infection as the cause of diseases classified elsewhere: Secondary | ICD-10-CM | POA: Diagnosis not present

## 2016-12-21 ENCOUNTER — Ambulatory Visit (HOSPITAL_COMMUNITY): Payer: Medicare Other

## 2016-12-26 ENCOUNTER — Ambulatory Visit (HOSPITAL_COMMUNITY): Payer: Medicare Other

## 2016-12-28 ENCOUNTER — Encounter (HOSPITAL_COMMUNITY): Payer: Self-pay

## 2016-12-28 ENCOUNTER — Emergency Department (HOSPITAL_COMMUNITY): Payer: Medicare Other

## 2016-12-28 ENCOUNTER — Inpatient Hospital Stay (HOSPITAL_COMMUNITY)
Admission: EM | Admit: 2016-12-28 | Discharge: 2016-12-30 | DRG: 871 | Disposition: A | Discharge status: Home / Self Care | Payer: Medicare Other | Attending: Family Medicine | Admitting: Family Medicine

## 2016-12-28 ENCOUNTER — Other Ambulatory Visit: Payer: Self-pay

## 2016-12-28 DIAGNOSIS — E785 Hyperlipidemia, unspecified: Secondary | ICD-10-CM | POA: Diagnosis present

## 2016-12-28 DIAGNOSIS — B961 Klebsiella pneumoniae [K. pneumoniae] as the cause of diseases classified elsewhere: Secondary | ICD-10-CM | POA: Diagnosis present

## 2016-12-28 DIAGNOSIS — N390 Urinary tract infection, site not specified: Secondary | ICD-10-CM | POA: Diagnosis not present

## 2016-12-28 DIAGNOSIS — J9611 Chronic respiratory failure with hypoxia: Secondary | ICD-10-CM | POA: Diagnosis present

## 2016-12-28 DIAGNOSIS — G20 Parkinson's disease: Secondary | ICD-10-CM | POA: Diagnosis not present

## 2016-12-28 DIAGNOSIS — I959 Hypotension, unspecified: Secondary | ICD-10-CM | POA: Diagnosis present

## 2016-12-28 DIAGNOSIS — Z7951 Long term (current) use of inhaled steroids: Secondary | ICD-10-CM | POA: Diagnosis not present

## 2016-12-28 DIAGNOSIS — J440 Chronic obstructive pulmonary disease with acute lower respiratory infection: Secondary | ICD-10-CM | POA: Diagnosis present

## 2016-12-28 DIAGNOSIS — J9610 Chronic respiratory failure, unspecified whether with hypoxia or hypercapnia: Secondary | ICD-10-CM | POA: Diagnosis present

## 2016-12-28 DIAGNOSIS — A4159 Other Gram-negative sepsis: Principal | ICD-10-CM | POA: Diagnosis present

## 2016-12-28 DIAGNOSIS — Z951 Presence of aortocoronary bypass graft: Secondary | ICD-10-CM | POA: Diagnosis not present

## 2016-12-28 DIAGNOSIS — N3000 Acute cystitis without hematuria: Secondary | ICD-10-CM | POA: Diagnosis present

## 2016-12-28 DIAGNOSIS — M1990 Unspecified osteoarthritis, unspecified site: Secondary | ICD-10-CM | POA: Diagnosis present

## 2016-12-28 DIAGNOSIS — J189 Pneumonia, unspecified organism: Secondary | ICD-10-CM | POA: Diagnosis not present

## 2016-12-28 DIAGNOSIS — N401 Enlarged prostate with lower urinary tract symptoms: Secondary | ICD-10-CM | POA: Diagnosis not present

## 2016-12-28 DIAGNOSIS — Z9981 Dependence on supplemental oxygen: Secondary | ICD-10-CM

## 2016-12-28 DIAGNOSIS — Z66 Do not resuscitate: Secondary | ICD-10-CM | POA: Diagnosis present

## 2016-12-28 DIAGNOSIS — I2510 Atherosclerotic heart disease of native coronary artery without angina pectoris: Secondary | ICD-10-CM | POA: Diagnosis present

## 2016-12-28 DIAGNOSIS — Z87891 Personal history of nicotine dependence: Secondary | ICD-10-CM | POA: Diagnosis not present

## 2016-12-28 DIAGNOSIS — J8410 Pulmonary fibrosis, unspecified: Secondary | ICD-10-CM | POA: Diagnosis present

## 2016-12-28 DIAGNOSIS — Z955 Presence of coronary angioplasty implant and graft: Secondary | ICD-10-CM | POA: Diagnosis not present

## 2016-12-28 DIAGNOSIS — I10 Essential (primary) hypertension: Secondary | ICD-10-CM | POA: Diagnosis present

## 2016-12-28 DIAGNOSIS — J441 Chronic obstructive pulmonary disease with (acute) exacerbation: Secondary | ICD-10-CM | POA: Diagnosis present

## 2016-12-28 DIAGNOSIS — Z888 Allergy status to other drugs, medicaments and biological substances status: Secondary | ICD-10-CM | POA: Diagnosis not present

## 2016-12-28 DIAGNOSIS — R1310 Dysphagia, unspecified: Secondary | ICD-10-CM | POA: Diagnosis present

## 2016-12-28 DIAGNOSIS — N400 Enlarged prostate without lower urinary tract symptoms: Secondary | ICD-10-CM | POA: Diagnosis present

## 2016-12-28 DIAGNOSIS — R031 Nonspecific low blood-pressure reading: Secondary | ICD-10-CM | POA: Diagnosis not present

## 2016-12-28 DIAGNOSIS — J449 Chronic obstructive pulmonary disease, unspecified: Secondary | ICD-10-CM | POA: Diagnosis not present

## 2016-12-28 DIAGNOSIS — I252 Old myocardial infarction: Secondary | ICD-10-CM | POA: Diagnosis not present

## 2016-12-28 LAB — I-STAT TROPONIN, ED: TROPONIN I, POC: 0.04 ng/mL (ref 0.00–0.08)

## 2016-12-28 LAB — URINALYSIS, ROUTINE W REFLEX MICROSCOPIC
BILIRUBIN URINE: NEGATIVE
GLUCOSE, UA: NEGATIVE mg/dL
HGB URINE DIPSTICK: NEGATIVE
KETONES UR: 5 mg/dL — AB
NITRITE: NEGATIVE
PROTEIN: 30 mg/dL — AB
Specific Gravity, Urine: 1.024 (ref 1.005–1.030)
pH: 5 (ref 5.0–8.0)

## 2016-12-28 LAB — CBC WITH DIFFERENTIAL/PLATELET
Basophils Absolute: 0 10*3/uL (ref 0.0–0.1)
Basophils Relative: 0 %
Eosinophils Absolute: 0.2 10*3/uL (ref 0.0–0.7)
Eosinophils Relative: 1 %
HCT: 39.1 % (ref 39.0–52.0)
Hemoglobin: 12.7 g/dL — ABNORMAL LOW (ref 13.0–17.0)
Lymphocytes Relative: 7 %
Lymphs Abs: 1.2 10*3/uL (ref 0.7–4.0)
MCH: 29.6 pg (ref 26.0–34.0)
MCHC: 32.5 g/dL (ref 30.0–36.0)
MCV: 91.1 fL (ref 78.0–100.0)
Monocytes Absolute: 1.4 10*3/uL — ABNORMAL HIGH (ref 0.1–1.0)
Monocytes Relative: 8 %
Neutro Abs: 14.2 10*3/uL — ABNORMAL HIGH (ref 1.7–7.7)
Neutrophils Relative %: 84 %
Platelets: 219 10*3/uL (ref 150–400)
RBC: 4.29 MIL/uL (ref 4.22–5.81)
RDW: 17.3 % — ABNORMAL HIGH (ref 11.5–15.5)
WBC: 17 10*3/uL — ABNORMAL HIGH (ref 4.0–10.5)

## 2016-12-28 LAB — COMPREHENSIVE METABOLIC PANEL
ALT: <5 U/L — ABNORMAL LOW (ref 17–63)
AST: 17 U/L (ref 15–41)
Albumin: 3.4 g/dL — ABNORMAL LOW (ref 3.5–5.0)
Alkaline Phosphatase: 84 U/L (ref 38–126)
Anion gap: 5 (ref 5–15)
BILIRUBIN TOTAL: 1.3 mg/dL — AB (ref 0.3–1.2)
BUN: 13 mg/dL (ref 6–20)
CO2: 28 mmol/L (ref 22–32)
Calcium: 8.7 mg/dL — ABNORMAL LOW (ref 8.9–10.3)
Chloride: 101 mmol/L (ref 101–111)
Creatinine, Ser: 0.91 mg/dL (ref 0.61–1.24)
GFR calc Af Amer: >60 mL/min (ref >60)
Glucose, Bld: 99 mg/dL (ref 65–99)
POTASSIUM: 4 mmol/L (ref 3.5–5.1)
Sodium: 134 mmol/L — ABNORMAL LOW (ref 135–145)
TOTAL PROTEIN: 6.6 g/dL (ref 6.5–8.1)

## 2016-12-28 LAB — STREP PNEUMONIAE URINARY ANTIGEN: STREP PNEUMO URINARY ANTIGEN: NEGATIVE

## 2016-12-28 LAB — I-STAT CG4 LACTIC ACID, ED
LACTIC ACID, VENOUS: 1.32 mmol/L (ref 0.5–1.9)
Lactic Acid, Venous: 0.71 mmol/L (ref 0.5–1.9)

## 2016-12-28 MED ORDER — FINASTERIDE 5 MG PO TABS
5.0000 mg | ORAL_TABLET | Freq: Every evening | ORAL | Status: DC
  Administered 2016-12-28 – 2016-12-29 (×2): 5 mg via ORAL
  Filled 2016-12-28 (×2): qty 1

## 2016-12-28 MED ORDER — CARBIDOPA-LEVODOPA ER 50-200 MG PO TBCR
1.0000 | EXTENDED_RELEASE_TABLET | Freq: Every day | ORAL | Status: DC
  Administered 2016-12-28 – 2016-12-29 (×2): 1 via ORAL
  Filled 2016-12-28 (×2): qty 1

## 2016-12-28 MED ORDER — METHYLPREDNISOLONE SODIUM SUCC 40 MG IJ SOLR
40.0000 mg | Freq: Four times a day (QID) | INTRAMUSCULAR | Status: DC
  Administered 2016-12-28 – 2016-12-29 (×4): 40 mg via INTRAVENOUS
  Filled 2016-12-28 (×4): qty 1

## 2016-12-28 MED ORDER — ALBUTEROL SULFATE (2.5 MG/3ML) 0.083% IN NEBU: 2.5000 mg | INHALATION_SOLUTION | RESPIRATORY_TRACT | Status: DC | PRN

## 2016-12-28 MED ORDER — MOMETASONE FURO-FORMOTEROL FUM 200-5 MCG/ACT IN AERO
2.0000 | INHALATION_SPRAY | Freq: Two times a day (BID) | RESPIRATORY_TRACT | Status: DC
  Administered 2016-12-29 – 2016-12-30 (×3): 2 via RESPIRATORY_TRACT
  Filled 2016-12-28: qty 8.8

## 2016-12-28 MED ORDER — UMECLIDINIUM BROMIDE 62.5 MCG/INH IN AEPB
1.0000 | INHALATION_SPRAY | Freq: Every day | RESPIRATORY_TRACT | Status: DC
  Administered 2016-12-29 – 2016-12-30 (×2): 1 via RESPIRATORY_TRACT
  Filled 2016-12-28: qty 7

## 2016-12-28 MED ORDER — GABAPENTIN 100 MG PO CAPS
100.0000 mg | ORAL_CAPSULE | Freq: Two times a day (BID) | ORAL | Status: DC
  Administered 2016-12-29 – 2016-12-30 (×3): 100 mg via ORAL
  Filled 2016-12-28 (×3): qty 1

## 2016-12-28 MED ORDER — SODIUM CHLORIDE 0.9 % IV SOLN
1000.0000 mL | INTRAVENOUS | Status: DC
  Administered 2016-12-28 – 2016-12-30 (×3): 1000 mL via INTRAVENOUS

## 2016-12-28 MED ORDER — GABAPENTIN 400 MG PO CAPS
400.0000 mg | ORAL_CAPSULE | Freq: Every day | ORAL | Status: DC
  Administered 2016-12-28 – 2016-12-29 (×2): 400 mg via ORAL
  Filled 2016-12-28 (×2): qty 1

## 2016-12-28 MED ORDER — ALFUZOSIN HCL ER 10 MG PO TB24
10.0000 mg | ORAL_TABLET | Freq: Every day | ORAL | Status: DC
  Administered 2016-12-29 – 2016-12-30 (×2): 10 mg via ORAL
  Filled 2016-12-28 (×2): qty 1

## 2016-12-28 MED ORDER — BUSPIRONE HCL 5 MG PO TABS
5.0000 mg | ORAL_TABLET | Freq: Two times a day (BID) | ORAL | Status: DC
  Administered 2016-12-28 – 2016-12-30 (×4): 5 mg via ORAL
  Filled 2016-12-28 (×4): qty 1

## 2016-12-28 MED ORDER — CEFEPIME HCL 1 G IJ SOLR
1.0000 g | Freq: Three times a day (TID) | INTRAMUSCULAR | Status: DC
  Filled 2016-12-28 (×2): qty 1

## 2016-12-28 MED ORDER — ACETAMINOPHEN 650 MG RE SUPP: 650.0000 mg | Freq: Four times a day (QID) | RECTAL | Status: DC | PRN

## 2016-12-28 MED ORDER — VANCOMYCIN HCL IN DEXTROSE 1-5 GM/200ML-% IV SOLN: 1000.0000 mg | Freq: Once | INTRAVENOUS | Status: DC

## 2016-12-28 MED ORDER — ORAL CARE MOUTH RINSE
15.0000 mL | Freq: Two times a day (BID) | OROMUCOSAL | Status: DC
  Administered 2016-12-28 – 2016-12-29 (×3): 15 mL via OROMUCOSAL

## 2016-12-28 MED ORDER — SODIUM CHLORIDE 0.9 % IV SOLN
1000.0000 mL | INTRAVENOUS | Status: DC
  Administered 2016-12-28: 1000 mL via INTRAVENOUS

## 2016-12-28 MED ORDER — ACETAMINOPHEN 325 MG PO TABS
650.0000 mg | ORAL_TABLET | Freq: Four times a day (QID) | ORAL | Status: DC | PRN
Start: 2016-12-28 — End: 2016-12-30

## 2016-12-28 MED ORDER — ATORVASTATIN CALCIUM 20 MG PO TABS
20.0000 mg | ORAL_TABLET | Freq: Every day | ORAL | Status: DC
  Administered 2016-12-29 – 2016-12-30 (×2): 20 mg via ORAL
  Filled 2016-12-28 (×2): qty 1

## 2016-12-28 MED ORDER — VANCOMYCIN HCL 10 G IV SOLR
1500.0000 mg | Freq: Once | INTRAVENOUS | Status: AC
  Administered 2016-12-28: 1500 mg via INTRAVENOUS
  Filled 2016-12-28: qty 1500

## 2016-12-28 MED ORDER — CARBIDOPA-LEVODOPA 25-100 MG PO TABS
2.0000 | ORAL_TABLET | Freq: Four times a day (QID) | ORAL | Status: DC
  Administered 2016-12-28 – 2016-12-30 (×8): 2 via ORAL
  Filled 2016-12-28 (×8): qty 2

## 2016-12-28 MED ORDER — FLUTICASONE PROPIONATE 50 MCG/ACT NA SUSP
1.0000 | Freq: Every day | NASAL | Status: DC
  Administered 2016-12-29 – 2016-12-30 (×2): 1 via NASAL
  Filled 2016-12-28: qty 16

## 2016-12-28 MED ORDER — ONDANSETRON HCL 4 MG/2ML IJ SOLN: 4.0000 mg | Freq: Four times a day (QID) | INTRAMUSCULAR | Status: DC | PRN

## 2016-12-28 MED ORDER — ONDANSETRON HCL 4 MG PO TABS: 4.0000 mg | ORAL_TABLET | Freq: Four times a day (QID) | ORAL | Status: DC | PRN

## 2016-12-28 MED ORDER — DEXTROSE 5 % IV SOLN
2.0000 g | Freq: Once | INTRAVENOUS | Status: AC
  Administered 2016-12-28: 2 g via INTRAVENOUS
  Filled 2016-12-28: qty 2

## 2016-12-28 MED ORDER — SODIUM CHLORIDE 0.9 % IV BOLUS (SEPSIS)
1000.0000 mL | Freq: Once | INTRAVENOUS | Status: AC
  Administered 2016-12-28: 1000 mL via INTRAVENOUS

## 2016-12-28 MED ORDER — DEXTROSE 5 % IV SOLN
1.0000 g | Freq: Three times a day (TID) | INTRAVENOUS | Status: DC
  Administered 2016-12-29 – 2016-12-30 (×5): 1 g via INTRAVENOUS
  Filled 2016-12-28 (×6): qty 1

## 2016-12-28 MED ORDER — VANCOMYCIN HCL 10 G IV SOLR: 1250.0000 mg | INTRAVENOUS | Status: DC

## 2016-12-28 NOTE — ED Provider Notes (Addendum)
[facility] DEPT Provider Note   CSN: [id number] Arrival date & time: 12/28/16  1313     History   Chief Complaint Chief Complaint  Patient presents with  . Urinary Frequency    Frequent urination during the night with pain.     HPI Bradley Bennett is a 83 y.o. male.  Patient is an 83 year old male who lives independently with a history of pulmonary fibrosis on 8-15 L of oxygen, hypertension, hyperlipidemia, coronary artery disease, recent diagnosis of sepsis due to a wound infection on his foot a few months ago who is presenting today with acute onset of frequency, urgency and dysuria starting last night.  Daughter states that when they got to the apartment to see him today he was still in bed and not dressed which is very unusual for him.  He then had some leaking of urine when he got out of bed and seemed a little bit weak.  Patient denies any new shortness of breath or cough.  However he was noted to be hypotensive at home and when EMS arrived with a systolic in the 41Y.  Patient has not eaten or drank anything since yesterday but his daughter did give him some water this morning.  He does not take medication for blood pressure.  He is denying any urinary symptoms currently and states before last night he was feeling his normal self.  No recent medication changes.  No prior history of urinary tract infections.   The history is provided by the patient.  Urinary Frequency     Past Medical History:  Diagnosis Date  . Arthritis   . Asthma   . Coronary artery disease   . Emphysema of lung ([place])   . Hearing difficulty of both ears   . Heart attack ([place])   . Heart murmur   . Hyperlipidemia   . Hypertension   . ILD (interstitial lung disease) ([place])   . Parkinson disease [facility])     Patient Active Problem List   Diagnosis Date Noted  . Bleeding from the nose 12/08/2016  . Hypokalemia 10/24/2016  . Bacteremia due to methicillin susceptible  Staphylococcus aureus (MSSA) 10/20/2016  . Hyponatremia 10/20/2016  . Cellulitis 10/19/2016  . Sepsis ([place]) 10/19/2016  . Trigger point of right shoulder region 10/05/2016  . Cervical radiculopathy 09/15/2016  . Chronic respiratory failure ([place]) 08/21/2016  . Chronic right shoulder pain 08/21/2016  . Hypotension 07/15/2016  . Diarrhea 07/14/2016  . Prediabetes 07/13/2016  . Dyspnea 06/15/2016  . Allergic rhinitis 05/25/2016  . COPD exacerbation ([place]) 05/10/2016  . Trigger thumb of both hands 01/15/2016  . COPD (chronic obstructive pulmonary disease) ([place]) 07/16/2015  . Hypoxemia 07/16/2015  . Postinflammatory pulmonary fibrosis ([place]) 07/16/2015  . Parkinson disease ([place]) 07/16/2015  . Hypothyroidism 07/16/2015  . Insomnia 07/16/2015  . BPH (benign prostatic hypertrophy) 07/16/2015  . Anxiety 07/16/2015  . Carpal tunnel syndrome, right 07/16/2015  . Chronic lower back pain 07/16/2015  . Macular degeneration 07/16/2015  . CAD in native artery 06/15/2015  . 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 S/P coronary artery stent placement 06/15/2015  . Hyperlipidemia 06/15/2015    Past Surgical History:  Procedure Laterality Date  . CARPAL TUNNEL RELEASE Right 03/2014  . CORONARY ANGIOPLASTY WITH STENT PLACEMENT    . CORONARY ARTERY BYPASS GRAFT  1992  . HERNIA REPAIR  10/2013   x3   . VEIN BYPASS SURGERY         Home Medications  Prior to Admission medications   Medication Sig Start Date End Date Taking? Authorizing Provider  acetaminophen (TYLENOL) 500 MG tablet Take 500-1,000 mg by mouth every 6 (six) hours as needed for mild pain, moderate pain, fever or headache.     [provider]  ADVAIR DISKUS 500-50 MCG/DOSE AEPB INHALE ONE PUFF BY MOUTH INTO THE LUNGS TWO TIMES A DAY 12/01/16   Juanito Doom, MD  alfuzosin (UROXATRAL) 10 MG 24 hr tablet Take 1 tablet (10 mg total) by mouth daily. 10/30/16   [name], MD  atorvastatin (LIPITOR) 20 MG tablet Take 1 tablet (20  mg total) by mouth daily. 02/08/16   Sueanne Margarita, MD  busPIRone (BUSPAR) 5 MG tablet Take 1 tablet (5 mg total) by mouth 2 (two) times daily. 07/14/16   [name], MD  carbidopa-levodopa (SINEMET CR) 50-200 MG tablet TAKE ONE TABLET BY MOUTH AT BEDTIME 10/10/16   Tat, Rebecca S, DO  carbidopa-levodopa (SINEMET IR) 25-100 MG tablet Take 2 tablets by mouth 4 (four) times daily. 12/04/16   Tat, Eustace Quail, DO  finasteride (PROSCAR) 5 MG tablet Take 1 tablet (5 mg total) by mouth every evening. 05/12/16   Donita Brooks, NP  fluticasone (FLONASE) 50 MCG/ACT nasal spray Place 1 spray into both nostrils daily. 05/25/16   Juanito Doom, MD  gabapentin (NEURONTIN) 100 MG capsule 100mg  at 8am and 4pm 10/30/16   Burns, [name], MD  gabapentin (NEURONTIN) 400 MG capsule Take 1 capsule (400 mg total) by mouth at bedtime. 10/30/16   [name], MD  INCRUSE ELLIPTA 62.5 MCG/INH AEPB INHALE ONE PUFF INTO THE LUNGS DAILY 12/01/16   Juanito Doom, MD  lactose free nutrition (BOOST PLUS) LIQD Take 237 mLs by mouth 3 (three) times daily with meals. 10/24/16   Sheikh, Georgina Quint Latif, DO  levothyroxine (SYNTHROID, LEVOTHROID) 150 MCG tablet TAKE ONE TABLET BY MOUTH DAILY Patient taking differently: TAKE 150MG  BY MOUTH DAILY 10/10/16   [name], MD  mirabegron ER (MYRBETRIQ) 50 MG TB24 tablet Take 50 mg by mouth every evening.    [provider]  Multiple Vitamin (MULTIVITAMIN WITH MINERALS) TABS tablet Take 1 tablet by mouth daily at 12 noon.    [provider]  Multiple Vitamins-Minerals (ICAPS AREDS 2 PO) Take 1-2 tablets by mouth See admin instructions. 2 tablets @ noon and 1 tablet @dinner     [provider]  mupirocin ointment (BACTROBAN) 2 % Place 1 application into the nose 2 (two) times daily.    [provider]  sodium chloride (OCEAN) 0.65 % SOLN nasal spray Place 1 spray into both nostrils as needed for congestion. 05/12/16   Donita Brooks, NP  traZODone  (DESYREL) 100 MG tablet TAKE ONE TABLET BY MOUTH AT BEDTIME 12/01/16   Juanito Doom, MD    Family History Family History  Problem Relation Age of Onset  . Colon cancer Father   . Stroke Father   . Arthritis Mother   . Arthritis Sister   . Heart disease Brother   . Kidney cancer Brother     Social History Social History   Tobacco Use  . Smoking status: Former Smoker    Packs/day: 2.00    Years: 35.00    Pack years: 70.00    Last attempt to quit: 08/15/1985    Years since quitting: 31.3  . Smokeless tobacco: Never Used  . Tobacco comment: quit smoking in 1992  Substance Use Topics  .  Alcohol use: Yes    Alcohol/week: 0.0 oz    Comment: twice every 6 months  . Drug use: No     Allergies   Isosorbide   Review of Systems Review of Systems  Genitourinary: Positive for frequency.  All other systems reviewed and are negative.    Physical Exam Updated Vital Signs BP 107/70   Pulse 80   Temp (!) 97.4 F (36.3 C) (Oral)   Resp 20   SpO2 97%   Physical Exam  Constitutional: He is oriented to person, place, and time. He appears well-developed and well-nourished. No distress.  HENT:  Head: Normocephalic and atraumatic.  Dry mucous membranes  Eyes: Conjunctivae and EOM are normal. Pupils are equal, round, and reactive to light.  Neck: Normal range of motion. Neck supple.  Cardiovascular: Normal rate, regular rhythm and intact distal pulses.  No murmur heard. Pulmonary/Chest: Effort normal and breath sounds normal. No respiratory distress. He has no wheezes. He has no rales.  Fine crackles throughout all lung fields on expiration.  No wheezing  Abdominal: Soft. He exhibits no distension. There is no tenderness. There is no rebound and no guarding.  Musculoskeletal: Normal range of motion. He exhibits no edema or tenderness.  Healing wound on the plantar surface of the right great toe.  No surrounding erythema or swelling  Neurological: He is alert and oriented  to person, place, and time.  Skin: Skin is warm and dry. No rash noted. No erythema.  Psychiatric: He has a normal mood and affect. His behavior is normal.  Nursing note and vitals reviewed.    ED Treatments / Results  Labs (all labs ordered are listed, but only abnormal results are displayed) Labs Reviewed  URINALYSIS, ROUTINE W REFLEX MICROSCOPIC - Abnormal; Notable for the following components:      Result Value   Color, Urine AMBER (*)    APPearance HAZY (*)    Ketones, ur 5 (*)    Protein, ur 30 (*)    Leukocytes, UA MODERATE (*)    Bacteria, UA MANY (*)    Squamous Epithelial / LPF 0-5 (*)    All other components within normal limits  COMPREHENSIVE METABOLIC PANEL - Abnormal; Notable for the following components:   Sodium 134 (*)    Calcium 8.7 (*)    Albumin 3.4 (*)    ALT <5 (*)    Total Bilirubin 1.3 (*)    All other components within normal limits  CBC WITH DIFFERENTIAL/PLATELET - Abnormal; Notable for the following components:   WBC 17.0 (*)    Hemoglobin 12.7 (*)    RDW 17.3 (*)    Neutro Abs 14.2 (*)    Monocytes Absolute 1.4 (*)    All other components within normal limits  CULTURE, BLOOD (ROUTINE X 2)  CULTURE, BLOOD (ROUTINE X 2)  URINE CULTURE  I-STAT CG4 LACTIC ACID, ED  I-STAT TROPONIN, ED  I-STAT CG4 LACTIC ACID, ED    EKG  EKG Interpretation  Date/Time:  Thursday December 28 2016 13:59:59 EST Ventricular Rate:  86 PR Interval:    QRS Duration: 112 QT Interval:  403 QTC Calculation: 482 R Axis:   63 Text Interpretation:  Sinus rhythm Borderline intraventricular conduction delay Low voltage, precordial leads Borderline prolonged QT interval Baseline wander in lead(s) V2 No significant change since last tracing Confirmed by Blanchie Dessert ([id number]) on 12/28/2016 2:46:17 PM       Radiology Dg Chest Port 1 View  Result Date: 12/28/2016 CLINICAL DATA:  Hypotension. EXAM: PORTABLE CHEST 1 VIEW COMPARISON:  Chest x-ray dated October 26, 2016. FINDINGS: Interval removal of the right-sided PICC line. Stable cardiomediastinal silhouette status post CABG. Extensive pulmonary fibrotic changes in both lungs are similar to prior study. Increased patchy peripheral densities in both lungs may reflect superimposed infection. No pleural effusion or pneumothorax. No acute osseous abnormality. IMPRESSION: Chronic fibrotic changes of both lungs with increased patchy peripheral densities suspicious for superimposed multifocal pneumonia. Electronically Signed   By: Titus Dubin M.D.   On: 12/28/2016 14:38    Procedures Procedures (including critical care time)  Medications Ordered in ED Medications  0.9 %  sodium chloride infusion (1,000 mLs Intravenous New Bag/Given 12/28/16 1347)     Initial Impression / Assessment and Plan / ED Course  I have reviewed the triage vital signs and the nursing notes.  Pertinent labs & imaging results that were available during my care of the patient were reviewed by me and considered in my medical decision making (see chart for details).     Patient is an elderly male presenting today with symptoms concerning for possible sepsis.  He had dysuria throughout the night and was hypotensive this morning.  Patient received 500 mL's of fluid prior to arrival and now has a blood pressure at 107/70 which he and his family confirm is his baseline.  Patient denies any change in his respiratory status, chest pain, abdominal pain, nausea or vomiting.  He has had no recent medication changes because not eating since yesterday. Patient is in no acute distress at this time however code sepsis was initiated due to his hypotension prior to arrival.  Chest x-ray also done due to patient's fragile respiratory status but currently not wheezing and just has fine crackles throughout which is most likely baseline.  Patient's oxygen saturation is about 97% on 10 L which family states he fluctuates on the amount of oxygen he uses  between 6 and 15 L.  He does not seem to be altered at this time. Will give a rate of 125 mL's per hour until further lab results.  2:46 PM Labs with a leukocytosis of 17,000, UA concerning for UTI with too numerous to count white blood cells chest x-ray with concerns for possible superimposed pneumonia.  On repeat evaluation patient's blood pressure is 93/60.  Maps are still okay however patient was given a bolus of 1 L.  He was started on healthcare associated infection as he was hospitalized in September.  He was given cefepime and vancomycin.  Lactate is within normal limits.  Final Clinical Impressions(s) / ED Diagnoses   Final diagnoses:  Acute cystitis without hematuria  HCAP (healthcare-associated pneumonia)    ED Discharge Orders    None       Blanchie Dessert, MD 12/28/16 1506    Blanchie Dessert, MD 12/28/16 1530

## 2016-12-28 NOTE — ED Notes (Signed)
Bed: AJ68 Expected date: 12/28/16 Expected time: 1:01 PM Means of arrival: Ambulance Comments: ? UTI

## 2016-12-28 NOTE — H&P (Addendum)
History and Physical    Bradley Bennett PJK:[id number] DOB: 1932/03/28 DOA: 12/28/2016  Referring MD/NP/PA:er PCP: [name], MD Outpatient Specialists: pulm Patient coming from: [place]  Chief Complaint: not feeling well  HPI: Bradley Bennett is a 83 y.o. male from [place] at [place] with medical history significant of COPD/ILD on 6-15 L of O2, parkinsons disease, HOH,  . Yesterday, patient was not feeling well and missed dinner with his family.  Later that night, he developed painful urination and inability to void completely.  When daughter arrived to see patient, he was not dressed and was leaking urine when he got out of bed.  He also feels weaker than normal.  He has chills as well starting yesterday.  He was found to have a BP in the 80s when EMS arrived (family checked and states it was in the 27s).  Patient was recently admitted to the hospital with a wound on his foot and MSSA/bacteremia for which he took antibiotics.  He was recently taken off plavix for nosebleeds and his flomax was changed to proscar for orthostatic hypotension  + cough, possible sick contacts, daughter states he does not normally wheeze either.  Family reports h/o dysphagia but improvement per patient-- nursing noticed that coughing started after patient ate a graham cracker  IN the ER, code sepsis was called due to hypotension.  Urine was found to appear to have infection.  Bladder scan post void was <50.  WBC count was elevated and x ray of lungs showed a possible PNA.  Patient was covered for [facility] due to recent hospitalization.      Review of Systems: all systems reviewed, negative unless stated above in HPI   Past Medical History:  Diagnosis Date  . Arthritis   . Asthma   . Coronary artery disease   . Emphysema of lung ([place])   . Hearing difficulty of both ears   . Heart attack ([place])   . Heart murmur   . Hyperlipidemia   . Hypertension   . ILD (interstitial lung  disease) ([place])   . Parkinson disease [facility])     Past Surgical History:  Procedure Laterality Date  . CARPAL TUNNEL RELEASE Right 03/2014  . CORONARY ANGIOPLASTY WITH STENT PLACEMENT    . CORONARY ARTERY BYPASS GRAFT  1992  . HERNIA REPAIR  10/2013   x3   . VEIN BYPASS SURGERY       reports that he quit smoking about 31 years ago. He has a 70.00 pack-year smoking history. he has never used smokeless tobacco. He reports that he drinks alcohol. He reports that he does not use drugs.  Allergies  Allergen Reactions  . Isosorbide Other (See Comments)    Reaction:  Headaches     Family History  Problem Relation Age of Onset  . Colon cancer Father   . Stroke Father   . Arthritis Mother   . Arthritis Sister   . Heart disease Brother   . Kidney cancer Brother      Prior to Admission medications   Medication Sig Start Date End Date Taking? Authorizing Provider  acetaminophen (TYLENOL) 500 MG tablet Take 500-1,000 mg by mouth every 6 (six) hours as needed for mild pain, moderate pain, fever or headache.     [provider]  ADVAIR DISKUS 500-50 MCG/DOSE AEPB INHALE ONE PUFF BY MOUTH INTO THE LUNGS TWO TIMES A DAY 12/01/16   Juanito Doom, MD  alfuzosin (UROXATRAL) 10 MG 24  hr tablet Take 1 tablet (10 mg total) by mouth daily. 10/30/16   [name], MD  atorvastatin (LIPITOR) 20 MG tablet Take 1 tablet (20 mg total) by mouth daily. 02/08/16   Sueanne Margarita, MD  busPIRone (BUSPAR) 5 MG tablet Take 1 tablet (5 mg total) by mouth 2 (two) times daily. 07/14/16   [name], MD  carbidopa-levodopa (SINEMET CR) 50-200 MG tablet TAKE ONE TABLET BY MOUTH AT BEDTIME 10/10/16   Tat, Rebecca S, DO  carbidopa-levodopa (SINEMET IR) 25-100 MG tablet Take 2 tablets by mouth 4 (four) times daily. 12/04/16   Tat, Eustace Quail, DO  finasteride (PROSCAR) 5 MG tablet Take 1 tablet (5 mg total) by mouth every evening. 05/12/16   Donita Brooks, NP  fluticasone (FLONASE) 50 MCG/ACT nasal spray  Place 1 spray into both nostrils daily. 05/25/16   Juanito Doom, MD  gabapentin (NEURONTIN) 100 MG capsule 100mg  at 8am and 4pm 10/30/16   Burns, [name], MD  gabapentin (NEURONTIN) 400 MG capsule Take 1 capsule (400 mg total) by mouth at bedtime. 10/30/16   [name], MD  INCRUSE ELLIPTA 62.5 MCG/INH AEPB INHALE ONE PUFF INTO THE LUNGS DAILY 12/01/16   Juanito Doom, MD  lactose free nutrition (BOOST PLUS) LIQD Take 237 mLs by mouth 3 (three) times daily with meals. 10/24/16   Sheikh, Georgina Quint Latif, DO  levothyroxine (SYNTHROID, LEVOTHROID) 150 MCG tablet TAKE ONE TABLET BY MOUTH DAILY Patient taking differently: TAKE 150MG  BY MOUTH DAILY 10/10/16   [name], MD  mirabegron ER (MYRBETRIQ) 50 MG TB24 tablet Take 50 mg by mouth every evening.    [provider]  Multiple Vitamin (MULTIVITAMIN WITH MINERALS) TABS tablet Take 1 tablet by mouth daily at 12 noon.    [provider]  Multiple Vitamins-Minerals (ICAPS AREDS 2 PO) Take 1-2 tablets by mouth See admin instructions. 2 tablets @ noon and 1 tablet @dinner     [provider]  mupirocin ointment (BACTROBAN) 2 % Place 1 application into the nose 2 (two) times daily.    [provider]  sodium chloride (OCEAN) 0.65 % SOLN nasal spray Place 1 spray into both nostrils as needed for congestion. 05/12/16   Donita Brooks, NP  traZODone (DESYREL) 100 MG tablet TAKE ONE TABLET BY MOUTH AT BEDTIME 12/01/16   Juanito Doom, MD    Physical Exam: Vitals:   12/28/16 1327 12/28/16 1331 12/28/16 1500 12/28/16 1530  BP: 93/69 107/70 (!) 108/55 (!) 88/59  Pulse: 80  95 (!) 106  Resp: 20  20 (!) 22  Temp: (!) 97.4 F (36.3 C)     TempSrc: Oral     SpO2: 97%   90%      Constitutional: NAD, calm, comfortable Vitals:   12/28/16 1327 12/28/16 1331 12/28/16 1500 12/28/16 1530  BP: 93/69 107/70 (!) 108/55 (!) 88/59  Pulse: 80  95 (!) 106  Resp: 20  20 (!) 22  Temp: (!) 97.4 F (36.3 C)     TempSrc:  Oral     SpO2: 97%   90%   Eyes: PERRL, lids and conjunctivae normal ENMT: Mucous membranes are moist. Posterior pharynx clear of any exudate or lesions.Normal dentition.  Neck: normal, supple, no masses, no thyromegaly Respiratory: clear to auscultation bilaterally, no wheezing, no crackles. Normal respiratory effort. No accessory muscle use.  Cardiovascular: Regular rate and rhythm, no murmurs / rubs / gallops. No extremity edema. 2+ pedal pulses. No carotid  bruits.  Abdomen: no tenderness, no masses palpated. No hepatosplenomegaly. Bowel sounds positive.  Musculoskeletal: no clubbing / cyanosis. No joint deformity upper and lower extremities. Good ROM, no contractures. Normal muscle tone.  Skin: no rashes, lesions, ulcers. No induration Neurologic: CN 2-12 grossly intact. Sensation intact, DTR normal. Strength 5/5 in all 4.  Psychiatric: Normal judgment and insight. Alert and oriented x 3. Normal mood.   (Anything < 9 systems with 2 bullets each down codes to level 1) (If patient refuses exam can't bill higher level) (Make sure to document decubitus ulcers present on admission -- if possible -- and whether patient has chronic indwelling catheter at time of admission)  Labs on Admission: I have personally reviewed following labs and imaging studies  CBC: Recent Labs  Lab 12/28/16 1405  WBC 17.0*  NEUTROABS 14.2*  HGB 12.7*  HCT 39.1  MCV 91.1  PLT 732   Basic Metabolic Panel: Recent Labs  Lab 12/28/16 1405  NA 134*  K 4.0  CL 101  CO2 28  GLUCOSE 99  BUN 13  CREATININE 0.91  CALCIUM 8.7*   GFR: CrCl cannot be calculated (Unknown ideal weight.). Liver Function Tests: Recent Labs  Lab 12/28/16 1405  AST 17  ALT <5*  ALKPHOS 84  BILITOT 1.3*  PROT 6.6  ALBUMIN 3.4*   No results for input(s): LIPASE, AMYLASE in the last 168 hours. No results for input(s): AMMONIA in the last 168 hours. Coagulation Profile: No results for input(s): INR, PROTIME in the last 168  hours. Cardiac Enzymes: No results for input(s): CKTOTAL, CKMB, CKMBINDEX, TROPONINI in the last 168 hours. BNP (last 3 results) Recent Labs    10/26/16 1728  PROBNP 39.0   HbA1C: No results for input(s): HGBA1C in the last 72 hours. CBG: No results for input(s): GLUCAP in the last 168 hours. Lipid Profile: No results for input(s): CHOL, HDL, LDLCALC, TRIG, CHOLHDL, LDLDIRECT in the last 72 hours. Thyroid Function Tests: No results for input(s): TSH, T4TOTAL, FREET4, T3FREE, THYROIDAB in the last 72 hours. Anemia Panel: No results for input(s): VITAMINB12, FOLATE, FERRITIN, TIBC, IRON, RETICCTPCT in the last 72 hours. Urine analysis:    Component Value Date/Time   COLORURINE AMBER (A) 12/28/2016 1345   APPEARANCEUR HAZY (A) 12/28/2016 1345   LABSPEC 1.024 12/28/2016 1345   PHURINE 5.0 12/28/2016 1345   GLUCOSEU NEGATIVE 12/28/2016 1345   HGBUR NEGATIVE 12/28/2016 1345   BILIRUBINUR NEGATIVE 12/28/2016 1345   KETONESUR 5 (A) 12/28/2016 1345   PROTEINUR 30 (A) 12/28/2016 1345   NITRITE NEGATIVE 12/28/2016 1345   LEUKOCYTESUR MODERATE (A) 12/28/2016 1345   Sepsis Labs: Invalid input(s): PROCALCITONIN, LACTICIDVEN No results found for this or any previous visit (from the past 240 hour(s)).   Radiological Exams on Admission: Dg Chest Port 1 View  Result Date: 12/28/2016 CLINICAL DATA:  Hypotension. EXAM: PORTABLE CHEST 1 VIEW COMPARISON:  Chest x-ray dated October 26, 2016. FINDINGS: Interval removal of the right-sided PICC line. Stable cardiomediastinal silhouette status post CABG. Extensive pulmonary fibrotic changes in both lungs are similar to prior study. Increased patchy peripheral densities in both lungs may reflect superimposed infection. No pleural effusion or pneumothorax. No acute osseous abnormality. IMPRESSION: Chronic fibrotic changes of both lungs with increased patchy peripheral densities suspicious for superimposed multifocal pneumonia. Electronically Signed    By: Titus Dubin M.D.   On: 12/28/2016 14:38      Assessment/Plan Active Problems:   COPD (chronic obstructive pulmonary disease) (HCC)   Postinflammatory pulmonary fibrosis ([place])  Parkinson disease ([place])   Benign prostatic hyperplasia   Hypotension   Chronic respiratory failure (HCC)   UTI (urinary tract infection)  UTI  -broad IV abx while cultures grow (also to cover for PNA) -gentle  IVF -no urinary retention  PNA +/- COPD flare -IV steroids -IV abx -nebs -from ILF so will get NP swab to r/o virus/flu  Dysphagia -SLP eval  Parkinson disease -PT Eval when more stable -continue home meds  Chronic respiratory failure -wears between 4-8L per family-- more when up moving  Hypotension -gentle IVF -SBP normally around 100  BPH -changed from flomax toproscar  DVT prophylaxis: *scd-- prone to nose bleed Code Status: DNR--non-invasive measures ok-- NRB/Bipap Family Communication: daughter at bedside Disposition Plan: pending PT eval-- from ILF Consults called:  Admission status: med surge inpt   Geradine Girt DO [facility] Pager [phone]  If 7PM-7AM, please contact night-coverage [email or web address] Password [facility]  12/28/2016, 4:19 PM

## 2016-12-28 NOTE — Progress Notes (Signed)
RT in to see new pt. with med. Inhalers ordered, found on (HFNC)High Flow Nasal Cannula with resevoir @ 12 lpm with no humidifier sats 94%, Meds ordered, RT to monitor.

## 2016-12-28 NOTE — Progress Notes (Signed)
Pharmacy Antibiotic Note  Bradley Bennett is a 83 y.o. male admitted from facility on 12/28/2016 with UTI.  Of note, patient was admitted to the hospital in September for treatment of foot wound and MSSA bacteremia.  Starting on broad spectrum antibiotics (cefepime + vancomycin) to also cover for possible pneumonia seen on CXR. Pharmacy has been consulted for vancomycin dosing.  Received vancomycin 1500 mg loading dose and cefepime 2g x 1 in the ED. CrCl~62 ml/min, most recent weight 78 kg 11/01/2016  Plan: Vancomycin 1250 mg IV q24h for goal AUC 400-500. Continue Cefepime 1g IV q8h as ordered. F/u culture results for narrowing.     Temp (24hrs), Avg:97.4 F (36.3 C), Min:97.4 F (36.3 C), Max:97.4 F (36.3 C)  Recent Labs  Lab 12/28/16 1405 12/28/16 1406 12/28/16 1619  WBC 17.0*  --   --   CREATININE 0.91  --   --   LATICACIDVEN  --  0.71 1.32    CrCl cannot be calculated (Unknown ideal weight.).    Allergies  Allergen Reactions  . Isosorbide Other (See Comments)    Reaction:  Headaches     Antimicrobials this admission: 11/22 Vancomycin >>  11/22 Cefepime >>   Dose adjustments this admission:  Microbiology results: 11/22 BCx:  11/22 UCx:   11/22 respiratory panel by PCR:   Thank you for allowing pharmacy to be a part of this patient's care.  Hershal Coria 12/28/2016 4:47 PM

## 2016-12-28 NOTE — ED Notes (Signed)
Report given to [facility] [id number].

## 2016-12-28 NOTE — ED Triage Notes (Signed)
Pt. Arrive via [facility]. UTI like symptoms started yesterday. Mostly painful urination. Initial pressure 82/64. 500 mL of fluid given and b/p- 96/56. 10L O2. If pt is up moving around he needs to be on 15L O2. No fever per ems. Hx. Of pulmonary fibrosis. 20g. In Left AC. CBG-152

## 2016-12-28 NOTE — Progress Notes (Signed)
A consult was received from an ED physician for vancomycin and cefepime per pharmacy dosing.  The patient's profile has been reviewed for ht/wt/allergies/indication/available labs.   A one time order has been placed for vancomycin 1500 mg and cefepime 2g.  Further antibiotics/pharmacy consults should be ordered by admitting physician if indicated.                       Thank you, Hershal Coria 12/28/2016  2:46 PM

## 2016-12-29 DIAGNOSIS — J8410 Pulmonary fibrosis, unspecified: Secondary | ICD-10-CM

## 2016-12-29 DIAGNOSIS — I959 Hypotension, unspecified: Secondary | ICD-10-CM

## 2016-12-29 DIAGNOSIS — J449 Chronic obstructive pulmonary disease, unspecified: Secondary | ICD-10-CM

## 2016-12-29 LAB — RESPIRATORY PANEL BY PCR
Adenovirus: NOT DETECTED
BORDETELLA PERTUSSIS-RVPCR: NOT DETECTED
CORONAVIRUS HKU1-RVPPCR: NOT DETECTED
Chlamydophila pneumoniae: NOT DETECTED
Coronavirus 229E: NOT DETECTED
Coronavirus NL63: NOT DETECTED
Coronavirus OC43: NOT DETECTED
INFLUENZA A-RVPPCR: NOT DETECTED
Influenza B: NOT DETECTED
METAPNEUMOVIRUS-RVPPCR: NOT DETECTED
Mycoplasma pneumoniae: NOT DETECTED
PARAINFLUENZA VIRUS 2-RVPPCR: NOT DETECTED
PARAINFLUENZA VIRUS 3-RVPPCR: NOT DETECTED
PARAINFLUENZA VIRUS 4-RVPPCR: NOT DETECTED
Parainfluenza Virus 1: NOT DETECTED
RESPIRATORY SYNCYTIAL VIRUS-RVPPCR: NOT DETECTED
RHINOVIRUS / ENTEROVIRUS - RVPPCR: NOT DETECTED

## 2016-12-29 LAB — BLOOD CULTURE ID PANEL (REFLEXED)
Acinetobacter baumannii: NOT DETECTED
CANDIDA GLABRATA: NOT DETECTED
CANDIDA TROPICALIS: NOT DETECTED
Candida albicans: NOT DETECTED
Candida krusei: NOT DETECTED
Candida parapsilosis: NOT DETECTED
ENTEROBACTER CLOACAE COMPLEX: NOT DETECTED
Enterobacteriaceae species: NOT DETECTED
Enterococcus species: NOT DETECTED
Escherichia coli: NOT DETECTED
HAEMOPHILUS INFLUENZAE: NOT DETECTED
KLEBSIELLA PNEUMONIAE: NOT DETECTED
Klebsiella oxytoca: NOT DETECTED
Listeria monocytogenes: NOT DETECTED
Methicillin resistance: DETECTED — AB
NEISSERIA MENINGITIDIS: NOT DETECTED
Proteus species: NOT DETECTED
Pseudomonas aeruginosa: NOT DETECTED
STAPHYLOCOCCUS AUREUS BCID: NOT DETECTED
STAPHYLOCOCCUS SPECIES: DETECTED — AB
STREPTOCOCCUS SPECIES: NOT DETECTED
Serratia marcescens: NOT DETECTED
Streptococcus agalactiae: NOT DETECTED
Streptococcus pneumoniae: NOT DETECTED
Streptococcus pyogenes: NOT DETECTED

## 2016-12-29 LAB — HIV ANTIBODY (ROUTINE TESTING W REFLEX): HIV SCREEN 4TH GENERATION: NONREACTIVE

## 2016-12-29 LAB — MRSA PCR SCREENING: MRSA BY PCR: NEGATIVE

## 2016-12-29 LAB — BASIC METABOLIC PANEL WITH GFR
Anion gap: 5 (ref 5–15)
BUN: 9 mg/dL (ref 6–20)
CO2: 27 mmol/L (ref 22–32)
Calcium: 8.7 mg/dL — ABNORMAL LOW (ref 8.9–10.3)
Chloride: 104 mmol/L (ref 101–111)
Creatinine, Ser: 0.62 mg/dL (ref 0.61–1.24)
GFR calc Af Amer: >60 mL/min
GFR calc non Af Amer: >60 mL/min
Glucose, Bld: 155 mg/dL — ABNORMAL HIGH (ref 65–99)
Potassium: 4.2 mmol/L (ref 3.5–5.1)
Sodium: 136 mmol/L (ref 135–145)

## 2016-12-29 LAB — CBC
HCT: 38.4 % — ABNORMAL LOW (ref 39.0–52.0)
Hemoglobin: 12.2 g/dL — ABNORMAL LOW (ref 13.0–17.0)
MCH: 28.9 pg (ref 26.0–34.0)
MCHC: 31.8 g/dL (ref 30.0–36.0)
MCV: 91 fL (ref 78.0–100.0)
Platelets: 223 K/uL (ref 150–400)
RBC: 4.22 MIL/uL (ref 4.22–5.81)
RDW: 17.3 % — ABNORMAL HIGH (ref 11.5–15.5)
WBC: 15.5 K/uL — ABNORMAL HIGH (ref 4.0–10.5)

## 2016-12-29 LAB — GLUCOSE, CAPILLARY: GLUCOSE-CAPILLARY: 148 mg/dL — AB (ref 65–99)

## 2016-12-29 MED ORDER — TRAZODONE HCL 50 MG PO TABS
50.0000 mg | ORAL_TABLET | Freq: Once | ORAL | Status: AC
  Administered 2016-12-30: 50 mg via ORAL
  Filled 2016-12-29: qty 1

## 2016-12-29 NOTE — Care Management Note (Signed)
Case Management Note  Patient Details  Name: Bradley Bennett MRN: [id number] Date of Birth: [birth year]  Subjective/Objective: 83 y/o m admitted w/UTI. From [facility]                    Action/Plan:d/c plan home.   Expected Discharge Date:                  Expected Discharge Plan:  Home/Self Care  In-House Referral:     Discharge planning Services  CM Consult  Post Acute Care Choice:    Choice offered to:     DME Arranged:    DME Agency:     HH Arranged:    HH Agency:     Status of Service:  In process, will continue to follow  If discussed at Long Length of Stay Meetings, dates discussed:    Additional Comments:  Dessa Phi, RN 12/29/2016, 11:57 AM

## 2016-12-29 NOTE — Evaluation (Signed)
Clinical/Bedside Swallow Evaluation Patient Details  Name: Bradley Bennett MRN: [id number] Date of Birth: 1932-04-27  Today's Date: 12/29/2016 Time: SLP Start Time (ACUTE ONLY): 0805 SLP Stop Time (ACUTE ONLY): 0900 SLP Time Calculation (min) (ACUTE ONLY): 55 min  Past Medical History:  Past Medical History:  Diagnosis Date  . Arthritis   . Asthma   . Coronary artery disease   . Emphysema of lung ([place])   . Hearing difficulty of both ears   . Heart attack ([place])   . Heart murmur   . Hyperlipidemia   . Hypertension   . ILD (interstitial lung disease) ([place])   . Parkinson disease [facility])    Past Surgical History:  Past Surgical History:  Procedure Laterality Date  . CARPAL TUNNEL RELEASE Right 03/2014  . CORONARY ANGIOPLASTY WITH STENT PLACEMENT    . CORONARY ARTERY BYPASS GRAFT  1992  . HERNIA REPAIR  10/2013   x3   . VEIN BYPASS SURGERY     HPI:  83 yo male adm to [facility] with AMS - acute cystitis without hematuria - found to have UTI.  PMH + for Parkinson's disease, ILD, HOH, prior smoker = quit 31 years ago.  Pt CXR concerning for multifocal pneumonia with chronic fibrotic lung changes 12/28/2016.  Swallow evaluation ordered.  Pt reports he has h/o dysphagia - resulting in occasional choking on liquids.  MBS done 01/11/2016 showed functional oropharyngeal swallow with flash penetration of thin.     Assessment / Plan / Recommendation Clinical Impression  Pt known to this SLP from OP MBS study in 01/2016 and he reports worsening problems swallowing.  He admits to very occasional problems with items "going down the wrong way".  CN exam unremarkable and pt without clinical indications of aspiration during po observations.  [place] 3 ounce water test passe easily.  Pt does admit to frequent belching if he drinks liquids "too fast".  When SLP inquired to reflux symptoms, he denies. He admits to consuming most carbonated beverages.  Educated pt to aspiration precautions, exercises to maxmize  swallow function/airway protection.  Recommend pt use incentive spirometer for diaphragmatic strengthening.  Using teach back, SLP educated pt to findings/recommendations/precautions.  No SlP follow up indicated.  SLP Visit Diagnosis: Dysphagia, unspecified (R13.10)    Aspiration Risk  Mild aspiration risk    Diet Recommendation Regular;Thin liquid   Medication Administration: Whole meds with liquid Supervision: Patient able to self feed Compensations: Slow rate;Small sips/bites    Other  Recommendations Oral Care Recommendations: Oral care BID   Follow up Recommendations None      Frequency and Duration   n/a         Prognosis   n/a     Swallow Study   General Date of Onset: 12/29/16 HPI: 83 yo male adm to [facility] with AMS - acute cystitis without hematuria - found to have UTI.  PMH + for Parkinson's disease, ILD, HOH, prior smoker = quit 31 years ago.  Pt CXR concerning for multifocal pneumonia with chronic fibrotic lung changes 12/28/2016.  Swallow evaluation ordered.  Pt reports he has h/o dysphagia - resulting in occasional choking on liquids.  MBS done 01/11/2016 showed functional oropharyngeal swallow with flash penetration of thin.   Type of Study: Bedside Swallow Evaluation Diet Prior to this Study: Dysphagia 3 (soft);Thin liquids Temperature Spikes Noted: No Respiratory Status: Nasal cannula(12 Liters - HFNC) History of Recent Intubation: No Behavior/Cognition: Alert;Cooperative;Pleasant mood Oral Cavity Assessment: Within Functional Limits Oral Care Completed by SLP:  No Oral Cavity - Dentition: Dentures, top;Adequate natural dentition Vision: Functional for self-feeding Self-Feeding Abilities: Able to feed self Patient Positioning: Upright in bed Baseline Vocal Quality: Normal Volitional Cough: Strong Volitional Swallow: Able to elicit    Oral/Motor/Sensory Function Overall Oral Motor/Sensory Function: Within functional limits   Ice Chips Ice chips: Not tested    Thin Liquid Thin Liquid: Within functional limits Presentation: Cup    Nectar Thick Nectar Thick Liquid: Not tested   Honey Thick Honey Thick Liquid: Not tested   Puree Puree: Within functional limits Presentation: Self Fed;Spoon   Solid   GO   Solid: Within functional limits Presentation: [place], [place] [facility] SLP [phone]

## 2016-12-29 NOTE — Progress Notes (Signed)
PHARMACY - PHYSICIAN COMMUNICATION CRITICAL VALUE ALERT - BLOOD CULTURE IDENTIFICATION (BCID)  Results for orders placed or performed during the hospital encounter of 12/28/16  Blood Culture ID Panel (Reflexed) (Collected: 12/28/2016  2:24 PM)  Result Value Ref Range   Enterococcus species NOT DETECTED NOT DETECTED   Listeria monocytogenes NOT DETECTED NOT DETECTED   Staphylococcus species DETECTED (A) NOT DETECTED   Staphylococcus aureus NOT DETECTED NOT DETECTED   Methicillin resistance DETECTED (A) NOT DETECTED   Streptococcus species NOT DETECTED NOT DETECTED   Streptococcus agalactiae NOT DETECTED NOT DETECTED   Streptococcus pneumoniae NOT DETECTED NOT DETECTED   Streptococcus pyogenes NOT DETECTED NOT DETECTED   Acinetobacter baumannii NOT DETECTED NOT DETECTED   Enterobacteriaceae species NOT DETECTED NOT DETECTED   Enterobacter cloacae complex NOT DETECTED NOT DETECTED   Escherichia coli NOT DETECTED NOT DETECTED   Klebsiella oxytoca NOT DETECTED NOT DETECTED   Klebsiella pneumoniae NOT DETECTED NOT DETECTED   Proteus species NOT DETECTED NOT DETECTED   Serratia marcescens NOT DETECTED NOT DETECTED   Haemophilus influenzae NOT DETECTED NOT DETECTED   Neisseria meningitidis NOT DETECTED NOT DETECTED   Pseudomonas aeruginosa NOT DETECTED NOT DETECTED   Candida albicans NOT DETECTED NOT DETECTED   Candida glabrata NOT DETECTED NOT DETECTED   Candida krusei NOT DETECTED NOT DETECTED   Candida parapsilosis NOT DETECTED NOT DETECTED   Candida tropicalis NOT DETECTED NOT DETECTED    Name of physician (or Provider) Contacted: Dr. Bonner Puna via text page  Changes to prescribed antibiotics required: none  Bradley Bennett 12/29/2016  6:30 PM

## 2016-12-29 NOTE — Progress Notes (Signed)
PROGRESS NOTE  Bradley Bennett  PFX:[id number] DOB: [birth year] DOA: 12/28/2016 PCP: [name], MD   Brief Narrative: Bradley Bennett is a 83 y.o. male with a history of CAD, COPD, ILD on 6-15L O2, and parkinson's disease who presented to the ED with urinary hesitancy and dysuria associated with weakness and chills. His daughter found him undressed at his independent living facility and called EMS. Initial SBP in 80's. Code sepsis was initiated on arrival. Urinalysis suggested infection, bladder scan ruled out retention. Leukocytosis was noted, and CXR showed chronic abnormalities with possible multifocal infiltrate. Vancomycin and cefepime were started to cover HCAP given recent admission for foot wound and MSSA bacteremia, for which he had completed treatment.   Assessment & Plan: Active Problems:   COPD (chronic obstructive pulmonary disease) (HCC)   Postinflammatory pulmonary fibrosis (HCC)   Parkinson disease (HCC)   Benign prostatic hyperplasia   Hypotension   Chronic respiratory failure (HCC)   UTI (urinary tract infection)  Sepsis due to Klebsiella UTI:  - Continue cefepime pending urine culture data.  - Blood cultures pending  HCAP: Multifocal pneumonia on CXR. RVP negative  - DC vancomycin given negative MRSA swab.  - Monitor sputum culture  Chronic hypoxic respiratory failure: Due to COPD/ILD.  - Continue home supplemental oxygen. Does not appear to have acute worsening.  BPH: No urinary retention, though recently changed from flomax to finasteride due to orthostatic hypotension. - Continue home medication and monitoring UOP   Dysphagia: Chronic, stated to have choked on a graham cracker. States sometimes food goes down the wrong pipe.  - Evaluated by SLP, mild aspiration risk. No aspiration when cued for aspiration precautions. No dietary changes recommended.  Parkinson's disease: Chronic, stable - PT/OT evaluation - No change to home regimen.   DVT  prophylaxis: SCDs Code Status: DNR Family Communication: None at bedside Disposition Plan: Anticipate return to ILF if stable over next 24 hours  Consultants:   Speech language pathology  Procedures:   None  Antimicrobials:  Vancomycin 11/22  Cefepime 11/22 >>    Subjective: Was weak on presentation, states that has resolved. No more burning with urination either. Denies fevers, chills, cough/sputum, wheezing, change in baseline dyspnea, abdominal pain, back pain, N/V/D.   Objective: Vitals:   12/28/16 2153 12/29/16 0707 12/29/16 0800 12/29/16 1300  BP: 136/78 (!) 110/58  (!) 107/58  Pulse: 92 87  78  Resp: (!) 22 20  20   Temp: 97.6 F (36.4 C) (!) 97.5 F (36.4 C)  97.7 F (36.5 C)  TempSrc: Oral Oral  Oral  SpO2: 97% 99% 96% 92%  Weight:      Height:        Intake/Output Summary (Last 24 hours) at 12/29/2016 1459 Last data filed at 12/29/2016 1300 Gross per 24 hour  Intake 1606.25 ml  Output 905 ml  Net 701.25 ml   Filed Weights   12/28/16 1722  Weight: 81.7 kg (180 lb 1.9 oz)    Gen: Elderly pleasant male in no distress Pulm: Non-labored breathing 12LPM (baseline). Clear to auscultation bilaterally.  CV: Regular rate and rhythm. No murmur, rub, or gallop. No JVD, no pedal edema. GI: Abdomen soft, non-tender, non-distended, with normoactive bowel sounds. No organomegaly or masses felt. Ext: Warm, no deformities Skin: No rashes, lesions no ulcers with attention paid to feet. Neuro: Alert and oriented. No focal neurological deficits. Psych: Judgement and insight appear normal. Mood & affect appropriate.   Data Reviewed: I have personally reviewed following  labs and imaging studies  CBC: Recent Labs  Lab 12/28/16 1405 12/29/16 0439  WBC 17.0* 15.5*  NEUTROABS 14.2*  --   HGB 12.7* 12.2*  HCT 39.1 38.4*  MCV 91.1 91.0  PLT 219 756   Basic Metabolic Panel: Recent Labs  Lab 12/28/16 1405 12/29/16 0439  NA 134* 136  K 4.0 4.2  CL 101 104    CO2 28 27  GLUCOSE 99 155*  BUN 13 9  CREATININE 0.91 0.62  CALCIUM 8.7* 8.7*   GFR: Estimated Creatinine Clearance: 73.5 mL/min (by C-G formula based on SCr of 0.62 mg/dL). Liver Function Tests: Recent Labs  Lab 12/28/16 1405  AST 17  ALT <5*  ALKPHOS 84  BILITOT 1.3*  PROT 6.6  ALBUMIN 3.4*   No results for input(s): LIPASE, AMYLASE in the last 168 hours. No results for input(s): AMMONIA in the last 168 hours. Coagulation Profile: No results for input(s): INR, PROTIME in the last 168 hours. Cardiac Enzymes: No results for input(s): CKTOTAL, CKMB, CKMBINDEX, TROPONINI in the last 168 hours. BNP (last 3 results) Recent Labs    10/26/16 1728  PROBNP 39.0   HbA1C: No results for input(s): HGBA1C in the last 72 hours. CBG: No results for input(s): GLUCAP in the last 168 hours. Lipid Profile: No results for input(s): CHOL, HDL, LDLCALC, TRIG, CHOLHDL, LDLDIRECT in the last 72 hours. Thyroid Function Tests: No results for input(s): TSH, T4TOTAL, FREET4, T3FREE, THYROIDAB in the last 72 hours. Anemia Panel: No results for input(s): VITAMINB12, FOLATE, FERRITIN, TIBC, IRON, RETICCTPCT in the last 72 hours. Urine analysis:    Component Value Date/Time   COLORURINE AMBER (A) 12/28/2016 1345   APPEARANCEUR HAZY (A) 12/28/2016 1345   LABSPEC 1.024 12/28/2016 1345   PHURINE 5.0 12/28/2016 1345   GLUCOSEU NEGATIVE 12/28/2016 1345   HGBUR NEGATIVE 12/28/2016 1345   BILIRUBINUR NEGATIVE 12/28/2016 1345   KETONESUR 5 (A) 12/28/2016 1345   PROTEINUR 30 (A) 12/28/2016 1345   NITRITE NEGATIVE 12/28/2016 1345   LEUKOCYTESUR MODERATE (A) 12/28/2016 1345   Recent Results (from the past 240 hour(s))  Urine culture     Status: Abnormal (Preliminary result)   Collection Time: 12/28/16  1:45 PM  Result Value Ref Range Status   Specimen Description URINE, CLEAN CATCH  Final   Special Requests NONE  Final   Culture (A)  Final    >=100,000 COLONIES/mL KLEBSIELLA  PNEUMONIAE SUSCEPTIBILITIES TO FOLLOW Performed at [facility], [place] [address] [place], [place] [address]    Report Status PENDING  Incomplete  Blood Culture (routine x 2)     Status: None (Preliminary result)   Collection Time: 12/28/16  2:05 PM  Result Value Ref Range Status   Specimen Description BLOOD RIGHT ANTECUBITAL  Final   Special Requests   Final    BOTTLES DRAWN AEROBIC AND ANAEROBIC Blood Culture adequate volume   Culture   Final    NO GROWTH < 24 HOURS Performed at [facility], [place] [address], [place], [place] [address]    Report Status PENDING  Incomplete  Blood Culture (routine x 2)     Status: None (Preliminary result)   Collection Time: 12/28/16  2:24 PM  Result Value Ref Range Status   Specimen Description BLOOD LEFT ANTECUBITAL  Final   Special Requests   Final    BOTTLES DRAWN AEROBIC AND ANAEROBIC Blood Culture adequate volume   Culture   Final    NO GROWTH < 24 HOURS Performed  at [facility], [place] [address], [place], [place] [address]    Report Status PENDING  Incomplete  Respiratory Panel by PCR     Status: None   Collection Time: 12/28/16  4:07 PM  Result Value Ref Range Status   Adenovirus NOT DETECTED NOT DETECTED Final   Coronavirus 229E NOT DETECTED NOT DETECTED Final   Coronavirus HKU1 NOT DETECTED NOT DETECTED Final   Coronavirus NL63 NOT DETECTED NOT DETECTED Final   Coronavirus OC43 NOT DETECTED NOT DETECTED Final   Metapneumovirus NOT DETECTED NOT DETECTED Final   Rhinovirus / Enterovirus NOT DETECTED NOT DETECTED Final   Influenza A NOT DETECTED NOT DETECTED Final   Influenza B NOT DETECTED NOT DETECTED Final   Parainfluenza Virus 1 NOT DETECTED NOT DETECTED Final   Parainfluenza Virus 2 NOT DETECTED NOT DETECTED Final   Parainfluenza Virus 3 NOT DETECTED NOT DETECTED Final   Parainfluenza Virus 4 NOT DETECTED NOT DETECTED Final   Respiratory Syncytial Virus NOT DETECTED NOT DETECTED Final   Bordetella pertussis NOT  DETECTED NOT DETECTED Final   Chlamydophila pneumoniae NOT DETECTED NOT DETECTED Final   Mycoplasma pneumoniae NOT DETECTED NOT DETECTED Final    Comment: Performed at [facility], [place] [address], [place], [place] [address]  MRSA PCR Screening     Status: None   Collection Time: 12/29/16  7:50 AM  Result Value Ref Range Status   MRSA by PCR NEGATIVE NEGATIVE Final    Comment:        The GeneXpert MRSA Assay (FDA approved for NASAL specimens only), is one component of a comprehensive MRSA colonization surveillance program. It is not intended to diagnose MRSA infection nor to guide or monitor treatment for MRSA infections.       Radiology Studies: Dg Chest Port 1 View  Result Date: 12/28/2016 CLINICAL DATA:  Hypotension. EXAM: PORTABLE CHEST 1 VIEW COMPARISON:  Chest x-ray dated October 26, 2016. FINDINGS: Interval removal of the right-sided PICC line. Stable cardiomediastinal silhouette status post CABG. Extensive pulmonary fibrotic changes in both lungs are similar to prior study. Increased patchy peripheral densities in both lungs may reflect superimposed infection. No pleural effusion or pneumothorax. No acute osseous abnormality. IMPRESSION: Chronic fibrotic changes of both lungs with increased patchy peripheral densities suspicious for superimposed multifocal pneumonia. Electronically Signed   By: Titus Dubin M.D.   On: 12/28/2016 14:38    Scheduled Meds: . alfuzosin  10 mg Oral Daily  . atorvastatin  20 mg Oral Daily  . busPIRone  5 mg Oral BID  . carbidopa-levodopa  1 tablet Oral QHS  . carbidopa-levodopa  2 tablet Oral QID  . finasteride  5 mg Oral QPM  . fluticasone  1 spray Each Nare Daily  . gabapentin  100 mg Oral BID AC  . gabapentin  400 mg Oral QHS  . mouth rinse  15 mL Mouth Rinse BID  . methylPREDNISolone (SOLU-MEDROL) injection  40 mg Intravenous Q6H  . mometasone-formoterol  2 puff Inhalation BID  . umeclidinium bromide  1 puff Inhalation Daily    Continuous Infusions: . sodium chloride 1,000 mL (12/29/16 0726)  . ceFEPime (MAXIPIME) IV Stopped (12/29/16 0920)     LOS: 1 day   Time spent: 25 minutes.  Vance Gather, MD [facility] Pager [phone]  If 7PM-7AM, please contact night-coverage [email or web address] Password [facility] 12/29/2016, 2:59 PM

## 2016-12-30 LAB — URINE CULTURE: Culture: >=100000 — AB

## 2016-12-30 LAB — BASIC METABOLIC PANEL
Anion gap: 6 (ref 5–15)
BUN: 18 mg/dL (ref 6–20)
CALCIUM: 8.6 mg/dL — AB (ref 8.9–10.3)
CO2: 27 mmol/L (ref 22–32)
CREATININE: 0.57 mg/dL — AB (ref 0.61–1.24)
Chloride: 106 mmol/L (ref 101–111)
GFR calc Af Amer: >60 mL/min (ref >60)
GLUCOSE: 133 mg/dL — AB (ref 65–99)
Potassium: 4 mmol/L (ref 3.5–5.1)
Sodium: 139 mmol/L (ref 135–145)

## 2016-12-30 LAB — CBC
HCT: 34 % — ABNORMAL LOW (ref 39.0–52.0)
Hemoglobin: 11 g/dL — ABNORMAL LOW (ref 13.0–17.0)
MCH: 29.4 pg (ref 26.0–34.0)
MCHC: 32.4 g/dL (ref 30.0–36.0)
MCV: 90.9 fL (ref 78.0–100.0)
PLATELETS: 235 10*3/uL (ref 150–400)
RBC: 3.74 MIL/uL — ABNORMAL LOW (ref 4.22–5.81)
RDW: 17.2 % — AB (ref 11.5–15.5)
WBC: 14.6 10*3/uL — ABNORMAL HIGH (ref 4.0–10.5)

## 2016-12-30 MED ORDER — CEFPODOXIME PROXETIL 200 MG PO TABS
200.0000 mg | ORAL_TABLET | Freq: Two times a day (BID) | ORAL | Status: DC
  Administered 2016-12-30: 200 mg via ORAL
  Filled 2016-12-30: qty 1

## 2016-12-30 MED ORDER — CEFPODOXIME PROXETIL 200 MG PO TABS: 200.0000 mg | ORAL_TABLET | Freq: Two times a day (BID) | ORAL | 0 refills | Status: DC

## 2016-12-30 NOTE — Progress Notes (Signed)
Went over discharge paperwork with patient.  All questions answered.  VSS.  Pt picked up by daughter.  Pt hooked up to personal portable 02 and wheeled out by NT.

## 2016-12-30 NOTE — Discharge Summary (Signed)
Physician Discharge Summary  Bradley Bennett PJK:[id number] DOB: 1932-08-08 DOA: 12/28/2016  PCP: [name], MD  Admit date: 12/28/2016 Discharge date: 12/30/2016  Admitted From: [facility] Disposition: [facility]   Recommendations for Outpatient Follow-up:  1. Follow up with PCP in 1-2 weeks 2. Please obtain BMP/CBC in one week 3. Please follow up on the following pending results: Final blood cultures from 11/22 (1 of 2 with CoNS felt to be contaminant).   Home Health: None Equipment/Devices: Continue oxygen therapy Discharge Condition: Stable CODE STATUS: DNR Diet recommendation: Heart healthy, dysphagia 3 diet  Brief/Interim Summary: Bradley Bennett is a 83 y.o. male with a history of CAD, COPD, ILD on 6-15L O2, and parkinson's disease who presented to the ED with urinary hesitancy and dysuria associated with weakness and chills. His daughter found him undressed at his independent living facility and called EMS. Initial SBP in 80's. Code sepsis was initiated on arrival. Urinalysis suggested infection, bladder scan ruled out retention. Leukocytosis was noted, and CXR showed chronic abnormalities with possible multifocal infiltrate. Vancomycin and cefepime were started to cover HCAP given recent admission for foot wound and MSSA bacteremia, for which he had completed treatment. Urine culture returned positive for klebsiella and respiratory status improved to baseline on antibiotics.   Discharge Diagnoses:  Active Problems:   COPD (chronic obstructive pulmonary disease) (HCC)   Postinflammatory pulmonary fibrosis (HCC)   Parkinson disease (HCC)   Benign prostatic hyperplasia   Hypotension   Chronic respiratory failure (HCC)   UTI (urinary tract infection)  Sepsis due to Klebsiella UTI:  - Continued cefepime, switched to vantin at discharge.  - Blood cultures pending  Coagulase-negative S. aureus in 1 of 4 blood culture bottles: Suspected contaminant,  improving on current antibiotic therapy.  - Monitor final results.  HCAP: Multifocal pneumonia on CXR. RVP negative  - DC'ed vancomycin given negative MRSA swab.  - Continue cefpodoxime as above - Monitor sputum culture - Consider repeat imaging after resolution.  Chronic hypoxic respiratory failure: Due to COPD/ILD.  - Continue home supplemental oxygen. Does not appear to have acute worsening.  BPH: No urinary retention, though recently changed from flomax to finasteride due to orthostatic hypotension. - Continue home medication and monitoring UOP   Dysphagia: Chronic, stated to have choked on a graham cracker. States sometimes food goes down the wrong pipe.  - Evaluated by SLP, mild aspiration risk. No aspiration when cued for aspiration precautions. No dietary changes recommended.  Parkinson's disease: Chronic, stable - No change to home regimen.   Discharge Instructions Discharge Instructions    Diet - low sodium heart healthy   Complete by:  As directed    Discharge instructions   Complete by:  As directed    You were admitted for a UTI causing confusion and burning with urination. The urine culture showed that this was due to Klebsiella (a bacterial infection) that is susceptible to the antibiotics we've been providing. The chest x-ray performed at admission also demonstrated multifocal pneumonia which can also be treated with an antibiotic called vantin.  - Take cefpodoxime (vantin) twice daily as directed to treat UTI and pneumonia.  - If your symptoms return, seek medical attention.   Increase activity slowly   Complete by:  As directed      Allergies as of 12/30/2016      Reactions   Isosorbide Other (See Comments)   Reaction:  Headaches       Medication List    TAKE these  medications   acetaminophen 500 MG tablet Commonly known as:  TYLENOL Take 500-1,000 mg by mouth every 6 (six) hours as needed for mild pain, moderate pain, fever or headache.   ADVAIR  DISKUS 500-50 MCG/DOSE Aepb Generic drug:  Fluticasone-Salmeterol INHALE ONE PUFF BY MOUTH INTO THE LUNGS TWO TIMES A DAY   alfuzosin 10 MG 24 hr tablet Commonly known as:  UROXATRAL Take 1 tablet (10 mg total) by mouth daily.   atorvastatin 20 MG tablet Commonly known as:  LIPITOR Take 1 tablet (20 mg total) by mouth daily.   busPIRone 5 MG tablet Commonly known as:  BUSPAR Take 1 tablet (5 mg total) by mouth 2 (two) times daily.   carbidopa-levodopa 50-200 MG tablet Commonly known as:  SINEMET CR TAKE ONE TABLET BY MOUTH AT BEDTIME   carbidopa-levodopa 25-100 MG tablet Commonly known as:  SINEMET IR Take 2 tablets by mouth 4 (four) times daily.   cefpodoxime 200 MG tablet Commonly known as:  VANTIN Take 1 tablet (200 mg total) by mouth every 12 (twelve) hours.   finasteride 5 MG tablet Commonly known as:  PROSCAR Take 1 tablet (5 mg total) by mouth every evening.   fluticasone 50 MCG/ACT nasal spray Commonly known as:  FLONASE Place 1 spray into both nostrils daily.   gabapentin 400 MG capsule Commonly known as:  NEURONTIN Take 1 capsule (400 mg total) by mouth at bedtime.   gabapentin 100 MG capsule Commonly known as:  NEURONTIN 100mg  at 8am and 4pm   ICAPS AREDS 2 PO Take 1-2 tablets by mouth See admin instructions. 2 tablets @ noon and 1 tablet @dinner    INCRUSE ELLIPTA 62.5 MCG/INH Aepb Generic drug:  umeclidinium bromide INHALE ONE PUFF INTO THE LUNGS DAILY   lactose free nutrition Liqd Take 237 mLs by mouth 3 (three) times daily with meals.   levothyroxine 150 MCG tablet Commonly known as:  SYNTHROID, LEVOTHROID TAKE ONE TABLET BY MOUTH DAILY What changed:    how much to take  how to take this  when to take this   multivitamin with minerals Tabs tablet Take 1 tablet by mouth daily at 12 noon.   MYRBETRIQ 50 MG Tb24 tablet Generic drug:  mirabegron ER Take 50 mg by mouth every evening.   sodium chloride 0.65 % Soln nasal spray Commonly  known as:  OCEAN Place 1 spray into both nostrils as needed for congestion.   traZODone 100 MG tablet Commonly known as:  DESYREL TAKE ONE TABLET BY MOUTH AT BEDTIME      Follow-up Information    [name], MD. Schedule an appointment as soon as possible for a visit in 2 week(s).   Specialty:  Internal Medicine Contact information: [place] [address] [phone]          Allergies  Allergen Reactions  . Isosorbide Other (See Comments)    Reaction:  Headaches     Consultations:  Speech language pathology  Procedures/Studies: Dg Chest Port 1 View  Result Date: 12/28/2016 CLINICAL DATA:  Hypotension. EXAM: PORTABLE CHEST 1 VIEW COMPARISON:  Chest x-ray dated October 26, 2016. FINDINGS: Interval removal of the right-sided PICC line. Stable cardiomediastinal silhouette status post CABG. Extensive pulmonary fibrotic changes in both lungs are similar to prior study. Increased patchy peripheral densities in both lungs may reflect superimposed infection. No pleural effusion or pneumothorax. No acute osseous abnormality. IMPRESSION: Chronic fibrotic changes of both lungs with increased patchy peripheral densities suspicious for superimposed multifocal  pneumonia. Electronically Signed   By: Titus Dubin M.D.   On: 12/28/2016 14:38    Subjective: No complaints, breathing at baseline. No weakness. Dysuria has resolved. Denies fevers, chills, cough/sputum, wheezing, change in baseline dyspnea, abdominal pain, back pain, N/V/D.   Discharge Exam: Vitals:   12/30/16 0802 12/30/16 1330  BP:  106/60  Pulse:  60  Resp:  16  Temp:  98.2 F (36.8 C)  SpO2: 96% 96%   General: Elderly male in no distress Cardiovascular: RRR, S1/S2 +, no rubs, no gallops Respiratory: Non-labored breathing 12LPM (baseline). Diffuse rhonchi. Abdominal: Soft, NT, ND, bowel sounds + Extremities: No edema, no cyanosis  Labs: BNP (last 3 results) Recent Labs    05/10/16 1405   BNP 220.2*   Basic Metabolic Panel: Recent Labs  Lab 12/28/16 1405 12/29/16 0439 12/30/16 0519  NA 134* 136 139  K 4.0 4.2 4.0  CL 101 104 106  CO2 28 27 27   GLUCOSE 99 155* 133*  BUN 13 9 18   CREATININE 0.91 0.62 0.57*  CALCIUM 8.7* 8.7* 8.6*   Liver Function Tests: Recent Labs  Lab 12/28/16 1405  AST 17  ALT <5*  ALKPHOS 84  BILITOT 1.3*  PROT 6.6  ALBUMIN 3.4*   No results for input(s): LIPASE, AMYLASE in the last 168 hours. No results for input(s): AMMONIA in the last 168 hours. CBC: Recent Labs  Lab 12/28/16 1405 12/29/16 0439 12/30/16 0519  WBC 17.0* 15.5* 14.6*  NEUTROABS 14.2*  --   --   HGB 12.7* 12.2* 11.0*  HCT 39.1 38.4* 34.0*  MCV 91.1 91.0 90.9  PLT 219 223 235   Cardiac Enzymes: No results for input(s): CKTOTAL, CKMB, CKMBINDEX, TROPONINI in the last 168 hours. BNP: Invalid input(s): POCBNP CBG: Recent Labs  Lab 12/29/16 1612  GLUCAP 148*   D-Dimer No results for input(s): DDIMER in the last 72 hours. Hgb A1c No results for input(s): HGBA1C in the last 72 hours. Lipid Profile No results for input(s): CHOL, HDL, LDLCALC, TRIG, CHOLHDL, LDLDIRECT in the last 72 hours. Thyroid function studies No results for input(s): TSH, T4TOTAL, T3FREE, THYROIDAB in the last 72 hours.  Invalid input(s): FREET3 Anemia work up No results for input(s): VITAMINB12, FOLATE, FERRITIN, TIBC, IRON, RETICCTPCT in the last 72 hours. Urinalysis    Component Value Date/Time   COLORURINE AMBER (A) 12/28/2016 1345   APPEARANCEUR HAZY (A) 12/28/2016 1345   LABSPEC 1.024 12/28/2016 1345   PHURINE 5.0 12/28/2016 1345   GLUCOSEU NEGATIVE 12/28/2016 1345   HGBUR NEGATIVE 12/28/2016 1345   BILIRUBINUR NEGATIVE 12/28/2016 1345   KETONESUR 5 (A) 12/28/2016 1345   PROTEINUR 30 (A) 12/28/2016 1345   NITRITE NEGATIVE 12/28/2016 1345   LEUKOCYTESUR MODERATE (A) 12/28/2016 1345    Microbiology Recent Results (from the past 240 hour(s))  Urine culture      Status: Abnormal   Collection Time: 12/28/16  1:45 PM  Result Value Ref Range Status   Specimen Description URINE, CLEAN CATCH  Final   Special Requests NONE  Final   Culture >=100,000 COLONIES/mL KLEBSIELLA PNEUMONIAE (A)  Final   Report Status 12/30/2016 FINAL  Final   Organism ID, Bacteria KLEBSIELLA PNEUMONIAE (A)  Final      Susceptibility   Klebsiella pneumoniae - MIC*    AMPICILLIN >=32 RESISTANT Resistant     CEFAZOLIN <=4 SENSITIVE Sensitive     CEFTRIAXONE <=1 SENSITIVE Sensitive     CIPROFLOXACIN <=0.25 SENSITIVE Sensitive     GENTAMICIN <=1 SENSITIVE Sensitive  IMIPENEM <=0.25 SENSITIVE Sensitive     NITROFURANTOIN <=16 SENSITIVE Sensitive     TRIMETH/SULFA <=20 SENSITIVE Sensitive     AMPICILLIN/SULBACTAM >=32 RESISTANT Resistant     PIP/TAZO >=128 RESISTANT Resistant     Extended ESBL NEGATIVE Sensitive     * >=100,000 COLONIES/mL KLEBSIELLA PNEUMONIAE  Blood Culture (routine x 2)     Status: None (Preliminary result)   Collection Time: 12/28/16  2:05 PM  Result Value Ref Range Status   Specimen Description BLOOD RIGHT ANTECUBITAL  Final   Special Requests   Final    BOTTLES DRAWN AEROBIC AND ANAEROBIC Blood Culture adequate volume   Culture   Final    NO GROWTH 3 DAYS Performed at [facility], [place] [address] [place], [place] [address]    Report Status PENDING  Incomplete  Blood Culture (routine x 2)     Status: Abnormal   Collection Time: 12/28/16  2:24 PM  Result Value Ref Range Status   Specimen Description BLOOD LEFT ANTECUBITAL  Final   Special Requests   Final    BOTTLES DRAWN AEROBIC AND ANAEROBIC Blood Culture adequate volume   Culture  Setup Time   Final    GRAM POSITIVE COCCI IN CLUSTERS AEROBIC BOTTLE ONLY CRITICAL RESULT CALLED TO, READ BACK BY AND VERIFIED WITH: A. RUNYON, RPHARMD ([facility], AT 1308 ON 12/29/16 BY C. JESSUP, MLT.    Culture (A)  Final    STAPHYLOCOCCUS SPECIES (COAGULASE NEGATIVE) THE SIGNIFICANCE OF ISOLATING THIS  ORGANISM FROM A SINGLE SET OF BLOOD CULTURES WHEN MULTIPLE SETS ARE DRAWN IS UNCERTAIN. PLEASE NOTIFY THE MICROBIOLOGY DEPARTMENT WITHIN ONE WEEK IF SPECIATION AND SENSITIVITIES ARE REQUIRED. Performed at [facility], [place] [address] [place], [place] [address]    Report Status 12/31/2016 FINAL  Final  Blood Culture ID Panel (Reflexed)     Status: Abnormal   Collection Time: 12/28/16  2:24 PM  Result Value Ref Range Status   Enterococcus species NOT DETECTED NOT DETECTED Final   Listeria monocytogenes NOT DETECTED NOT DETECTED Final   Staphylococcus species DETECTED (A) NOT DETECTED Final    Comment: Methicillin (oxacillin) resistant coagulase negative staphylococcus. Possible blood culture contaminant (unless isolated from more than one blood culture draw or clinical case suggests pathogenicity). No antibiotic treatment is indicated for blood  culture contaminants. CRITICAL RESULT CALLED TO, READ BACK BY AND VERIFIED WITH: A. RUNYON, RPHARMD AT 6962 ON 12/29/16 BY C. JESSUP, MLT.    Staphylococcus aureus NOT DETECTED NOT DETECTED Final   Methicillin resistance DETECTED (A) NOT DETECTED Final    Comment: CRITICAL RESULT CALLED TO, READ BACK BY AND VERIFIED WITH: A. RUNYON, RPHARMD AT 9528 ON 12/29/16 BY C. JESSUP, MLT.    Streptococcus species NOT DETECTED NOT DETECTED Final   Streptococcus agalactiae NOT DETECTED NOT DETECTED Final   Streptococcus pneumoniae NOT DETECTED NOT DETECTED Final   Streptococcus pyogenes NOT DETECTED NOT DETECTED Final   Acinetobacter baumannii NOT DETECTED NOT DETECTED Final   Enterobacteriaceae species NOT DETECTED NOT DETECTED Final   Enterobacter cloacae complex NOT DETECTED NOT DETECTED Final   Escherichia coli NOT DETECTED NOT DETECTED Final   Klebsiella oxytoca NOT DETECTED NOT DETECTED Final   Klebsiella pneumoniae NOT DETECTED NOT DETECTED Final   Proteus species NOT DETECTED NOT DETECTED Final   Serratia marcescens NOT DETECTED NOT DETECTED  Final   Haemophilus influenzae NOT DETECTED NOT DETECTED Final   Neisseria meningitidis NOT DETECTED NOT DETECTED Final   Pseudomonas aeruginosa NOT  DETECTED NOT DETECTED Final   Candida albicans NOT DETECTED NOT DETECTED Final   Candida glabrata NOT DETECTED NOT DETECTED Final   Candida krusei NOT DETECTED NOT DETECTED Final   Candida parapsilosis NOT DETECTED NOT DETECTED Final   Candida tropicalis NOT DETECTED NOT DETECTED Final  Respiratory Panel by PCR     Status: None   Collection Time: 12/28/16  4:07 PM  Result Value Ref Range Status   Adenovirus NOT DETECTED NOT DETECTED Final   Coronavirus 229E NOT DETECTED NOT DETECTED Final   Coronavirus HKU1 NOT DETECTED NOT DETECTED Final   Coronavirus NL63 NOT DETECTED NOT DETECTED Final   Coronavirus OC43 NOT DETECTED NOT DETECTED Final   Metapneumovirus NOT DETECTED NOT DETECTED Final   Rhinovirus / Enterovirus NOT DETECTED NOT DETECTED Final   Influenza A NOT DETECTED NOT DETECTED Final   Influenza B NOT DETECTED NOT DETECTED Final   Parainfluenza Virus 1 NOT DETECTED NOT DETECTED Final   Parainfluenza Virus 2 NOT DETECTED NOT DETECTED Final   Parainfluenza Virus 3 NOT DETECTED NOT DETECTED Final   Parainfluenza Virus 4 NOT DETECTED NOT DETECTED Final   Respiratory Syncytial Virus NOT DETECTED NOT DETECTED Final   Bordetella pertussis NOT DETECTED NOT DETECTED Final   Chlamydophila pneumoniae NOT DETECTED NOT DETECTED Final   Mycoplasma pneumoniae NOT DETECTED NOT DETECTED Final    Comment: Performed at [facility], [place] [address], [place], [place] [address]  MRSA PCR Screening     Status: None   Collection Time: 12/29/16  7:50 AM  Result Value Ref Range Status   MRSA by PCR NEGATIVE NEGATIVE Final    Comment:        The GeneXpert MRSA Assay (FDA approved for NASAL specimens only), is one component of a comprehensive MRSA colonization surveillance program. It is not intended to diagnose MRSA infection nor to guide  or monitor treatment for MRSA infections.     Time coordinating discharge: Approximately 40 minutes  Vance Gather, MD  [facility] 12/30/2016, 3:44 PM Pager [phone]

## 2016-12-31 LAB — CULTURE, BLOOD (ROUTINE X 2): SPECIAL REQUESTS: ADEQUATE

## 2017-01-01 ENCOUNTER — Ambulatory Visit (INDEPENDENT_AMBULATORY_CARE_PROVIDER_SITE_OTHER)
Admission: RE | Admit: 2017-01-01 | Discharge: 2017-01-01 | Disposition: A | Discharge status: Home / Self Care | Payer: Medicare Other | Source: Ambulatory Visit | Attending: Adult Health | Admitting: Adult Health

## 2017-01-01 ENCOUNTER — Telehealth: Payer: Self-pay | Admitting: *Deleted

## 2017-01-01 ENCOUNTER — Encounter: Payer: Self-pay | Admitting: Adult Health

## 2017-01-01 ENCOUNTER — Other Ambulatory Visit: Payer: Self-pay | Admitting: Cardiology

## 2017-01-01 ENCOUNTER — Other Ambulatory Visit: Payer: Self-pay | Admitting: Internal Medicine

## 2017-01-01 ENCOUNTER — Ambulatory Visit (INDEPENDENT_AMBULATORY_CARE_PROVIDER_SITE_OTHER): Payer: Medicare Other | Admitting: Adult Health

## 2017-01-01 VITALS — BP 96/62 | HR 77 | Ht 70.5 in | Wt 182.4 lb

## 2017-01-01 DIAGNOSIS — J189 Pneumonia, unspecified organism: Secondary | ICD-10-CM | POA: Diagnosis not present

## 2017-01-01 DIAGNOSIS — J9611 Chronic respiratory failure with hypoxia: Secondary | ICD-10-CM

## 2017-01-01 DIAGNOSIS — I2510 Atherosclerotic heart disease of native coronary artery without angina pectoris: Secondary | ICD-10-CM

## 2017-01-01 DIAGNOSIS — J449 Chronic obstructive pulmonary disease, unspecified: Secondary | ICD-10-CM

## 2017-01-01 MED ORDER — TIOTROPIUM BROMIDE-OLODATEROL 2.5-2.5 MCG/ACT IN AERS: 2.0000 | INHALATION_SPRAY | Freq: Every day | RESPIRATORY_TRACT | 1 refills | Status: DC

## 2017-01-01 MED ORDER — TIOTROPIUM BROMIDE-OLODATEROL 2.5-2.5 MCG/ACT IN AERS: 2.0000 | INHALATION_SPRAY | Freq: Every day | RESPIRATORY_TRACT | 0 refills | Status: DC

## 2017-01-01 NOTE — Progress Notes (Signed)
@Patient  ID: Bradley Bennett, male    DOB: May 06, 1932, 83 y.o.   MRN: [id number]  Chief Complaint  Patient presents with  . Follow-up    COPD     Referring provider: [name], MD  HPI: 83 year old male former smoker followed for pulmonary fibrosis and COPD with a history of chronic aspiration in the setting of Parkinson's disease. Along with chronic hypoxic respiratory failure on O2  Independent Living =Apartment . Lives alone. Daughter helps .  Previous air traffic controller   TEST  PFT 01/2016 FEV1 114%, ratio 75, FVC 114, DLCO 20%.  CXR 09/26/16 >widespread fibrotic changes   01/01/2017 Follow up : PNA /COPD/ Pulmonary Fibroiss  Pt returns for follow up from recent hospitalization .  Recent admission for Urosepsis -Klebsiella UTI and HCAP . He was treated with abx and discharged on Vantin . CXR 12/28/16 showed increased patchy densities bilaterally .  He is feeling better. Confusion resolved . Never had any cough or congestion . No fever.  Eating /appetite is improved.   Has Mild COPD /Emphysema -bullous dz.  Remains on INCRUSE/ADVAIR . Daughter believes he does not breathe all the meds in . We discussed using different inhaler vs nebs. He would like to try alternative inhaler for now.   On chronic resp failure on high flow O2. On Oxygen 8-10 l/m l/m rest , and 15 l/m exercise.    Allergies  Allergen Reactions  . Isosorbide Other (See Comments)    Reaction:  Headaches     Immunization History  Administered Date(s) Administered  . Influenza Split 11/09/2014  . Influenza, High Dose Seasonal PF 12/04/2015, 11/01/2016  . Pneumococcal Conjugate-13 01/14/2016  . Pneumococcal Polysaccharide-23 10/12/2014    Past Medical History:  Diagnosis Date  . Arthritis   . Asthma   . Coronary artery disease   . Emphysema of lung ([place])   . Hearing difficulty of both ears   . Heart attack ([place])   . Heart murmur   . Hyperlipidemia   . Hypertension   . ILD (interstitial  lung disease) ([place])   . Parkinson disease ([place])     Tobacco History: Social History   Tobacco Use  Smoking Status Former Smoker  . Packs/day: 2.00  . Years: 35.00  . Pack years: 70.00  . Last attempt to quit: 08/15/1985  . Years since quitting: 31.4  Smokeless Tobacco Never Used  Tobacco Comment   quit smoking in 1992   Counseling given: Not Answered Comment: quit smoking in 1992   Outpatient Encounter Medications as of 01/01/2017  Medication Sig  . acetaminophen (TYLENOL) 500 MG tablet Take 500-1,000 mg by mouth every 6 (six) hours as needed for mild pain, moderate pain, fever or headache.   . ADVAIR DISKUS 500-50 MCG/DOSE AEPB INHALE ONE PUFF BY MOUTH INTO THE LUNGS TWO TIMES A DAY  . alfuzosin (UROXATRAL) 10 MG 24 hr tablet Take 1 tablet (10 mg total) by mouth daily.  Marland Kitchen atorvastatin (LIPITOR) 20 MG tablet Take 1 tablet (20 mg total) by mouth daily.  . carbidopa-levodopa (SINEMET CR) 50-200 MG tablet TAKE ONE TABLET BY MOUTH AT BEDTIME  . carbidopa-levodopa (SINEMET IR) 25-100 MG tablet Take 2 tablets by mouth 4 (four) times daily.  . cefpodoxime (VANTIN) 200 MG tablet Take 1 tablet (200 mg total) by mouth every 12 (twelve) hours.  . finasteride (PROSCAR) 5 MG tablet Take 1 tablet (5 mg total) by mouth every evening.  . fluticasone (FLONASE) 50 MCG/ACT nasal spray Place  1 spray into both nostrils daily.  Marland Kitchen gabapentin (NEURONTIN) 100 MG capsule 100mg  at 8am and 4pm  . gabapentin (NEURONTIN) 400 MG capsule Take 1 capsule (400 mg total) by mouth at bedtime.  . INCRUSE ELLIPTA 62.5 MCG/INH AEPB INHALE ONE PUFF INTO THE LUNGS DAILY  . lactose free nutrition (BOOST PLUS) LIQD Take 237 mLs by mouth 3 (three) times daily with meals.  Marland Kitchen levothyroxine (SYNTHROID, LEVOTHROID) 150 MCG tablet TAKE ONE TABLET BY MOUTH DAILY (Patient taking differently: TAKE 150MG  BY MOUTH DAILY)  . mirabegron ER (MYRBETRIQ) 50 MG TB24 tablet Take 50 mg by mouth every evening.  . Multiple Vitamin  (MULTIVITAMIN WITH MINERALS) TABS tablet Take 1 tablet by mouth daily at 12 noon.  . Multiple Vitamins-Minerals (ICAPS AREDS 2 PO) Take 1-2 tablets by mouth See admin instructions. 2 tablets @ noon and 1 tablet @dinner   . sodium chloride (OCEAN) 0.65 % SOLN nasal spray Place 1 spray into both nostrils as needed for congestion.  . traZODone (DESYREL) 100 MG tablet TAKE ONE TABLET BY MOUTH AT BEDTIME  . [DISCONTINUED] busPIRone (BUSPAR) 5 MG tablet Take 1 tablet (5 mg total) by mouth 2 (two) times daily.  . Tiotropium Bromide-Olodaterol (STIOLTO RESPIMAT) 2.5-2.5 MCG/ACT AERS Inhale 2 puffs into the lungs daily.  . Tiotropium Bromide-Olodaterol (STIOLTO RESPIMAT) 2.5-2.5 MCG/ACT AERS Inhale 2 puffs into the lungs daily.   No facility-administered encounter medications on file as of 01/01/2017.      Review of Systems  Constitutional:   No  weight loss, night sweats,  Fevers, chills,  +fatigue, or  lassitude.  HEENT:   No headaches,  Difficulty swallowing,  Tooth/dental problems, or  Sore throat,                No sneezing, itching, ear ache, nasal congestion, post nasal drip,   CV:  No chest pain,  Orthopnea, PND, swelling in lower extremities, anasarca, dizziness, palpitations, syncope.   GI  No heartburn, indigestion, abdominal pain, nausea, vomiting, diarrhea, change in bowel habits, loss of appetite, bloody stools.   Resp:  No chest wall deformity  Skin: no rash or lesions.  GU: no dysuria, change in color of urine, no urgency or frequency.  No flank pain, no hematuria   MS:  No joint pain or swelling.  No decreased range of motion.  No back pain.    Physical Exam  BP 96/62 (BP Location: Left Arm, Cuff Size: Normal)   Pulse 77   Ht 5' 10.5" (1.791 m)   Wt 182 lb 6.4 oz (82.7 kg)   SpO2 92%   BMI 25.80 kg/m   GEN: A/Ox3; pleasant , NAD, chronically ill appearing on o2 in wc    HEENT:  [place]/AT,  EACs-clear, TMs-wnl, NOSE-clear, THROAT-clear, no lesions, no postnasal drip or  exudate noted.   NECK:  Supple w/ fair ROM; no JVD; normal carotid impulses w/o bruits; no thyromegaly or nodules palpated; no lymphadenopathy.    RESP  BB crackles noted.  . no accessory muscle use, no dullness to percussion  CARD:  RRR, no m/r/g, tr  peripheral edema, pulses intact, no cyanosis or clubbing.  GI:   Soft & nt; nml bowel sounds; no organomegaly or masses detected.   Musco: Warm bil, no deformities or joint swelling noted.   Neuro: alert, no focal deficits noted.    Skin: Warm, no lesions or rashes    Lab Results:  CBC  BMET   ProBNP   Imaging: Dg Chest 2 View  Result Date: 01/01/2017 CLINICAL DATA:  Follow-up pneumonia, COPD. EXAM: CHEST  2 VIEW COMPARISON:  12/28/2016 FINDINGS: Severe chronic lung disease and fibrosis throughout the lungs. No definite acute airspace opacity or effusion. Heart is borderline in size. Prior CABG. IMPRESSION: Severe chronic fibrotic changes throughout the lungs. No acute process. Electronically Signed   By: Rolm Baptise M.D.   On: 01/01/2017 11:32   Dg Chest Port 1 View  Result Date: 12/28/2016 CLINICAL DATA:  Hypotension. EXAM: PORTABLE CHEST 1 VIEW COMPARISON:  Chest x-ray dated October 26, 2016. FINDINGS: Interval removal of the right-sided PICC line. Stable cardiomediastinal silhouette status post CABG. Extensive pulmonary fibrotic changes in both lungs are similar to prior study. Increased patchy peripheral densities in both lungs may reflect superimposed infection. No pleural effusion or pneumothorax. No acute osseous abnormality. IMPRESSION: Chronic fibrotic changes of both lungs with increased patchy peripheral densities suspicious for superimposed multifocal pneumonia. Electronically Signed   By: Titus Dubin M.D.   On: 12/28/2016 14:38     Assessment & Plan:   COPD (chronic obstructive pulmonary disease) ([place]) Appears stable - unclear if current inhaler are effective. 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 He has no significant airflow obstruction . Unclear if inhalers are contributing much . However with recent hospitaliaation, high flow O2 use and Emphysema do not think this is the time for a trial off inhalers.  Will change to LABA/LAMA inhaler with Respimat device   Plan  Patient Instructions  Chest xray today  Continue on current oxygen level.  Goal is to  keep Oxygen level >88-90%.  Hold Advair and Incruse.  Trial of Stiolto 2 puffs daily . Rinse after use.  Stop flonase  Saline nasal rinses and gel As needed   Follow up with Dr Lake Bells in 4-6 weeks and As needed       Chronic respiratory failure ([place]) Cont on high flow O2 , goal sat >88-90%  Plan  Cont on O2 .   HCAP (healthcare-associated pneumonia) Clinically improving after abx .  Check cxr   Plan  Patient Instructions  Chest xray today  Continue on current oxygen level.  Goal is to  keep Oxygen level >88-90%.  Hold Advair and Incruse.  Trial of Stiolto 2 puffs daily . Rinse after use.  Stop flonase  Saline nasal rinses and gel As needed   Follow up with Dr Lake Bells in 4-6 weeks and As needed          Rexene Edison, NP 01/01/2017

## 2017-01-01 NOTE — Progress Notes (Signed)
Reviewed, agree 

## 2017-01-01 NOTE — Telephone Encounter (Signed)
Called pt to make TCM Hosp f/u appt no answer LMOM RTC../lmb 

## 2017-01-01 NOTE — Assessment & Plan Note (Signed)
Appears stable - unclear if current inhaler are effective. His main issue is Restrictive Lung disease due to Pulmonary Fibrosis and Emphysema . He has no significant airflow obstruction . Unclear if inhalers are contributing much . However with recent hospitaliaation, high flow O2 use and Emphysema do not think this is the time for a trial off inhalers.  Will change to LABA/LAMA inhaler with Respimat device   Plan  Patient Instructions  Chest xray today  Continue on current oxygen level.  Goal is to  keep Oxygen level >88-90%.  Hold Advair and Incruse.  Trial of Stiolto 2 puffs daily . Rinse after use.  Stop flonase  Saline nasal rinses and gel As needed   Follow up with Dr Lake Bells in 4-6 weeks and As needed

## 2017-01-01 NOTE — Assessment & Plan Note (Signed)
Cont on high flow O2 , goal sat >88-90%  Plan  Cont on O2 .

## 2017-01-01 NOTE — Assessment & Plan Note (Signed)
Clinically improving after abx .  Check cxr   Plan  Patient Instructions  Chest xray today  Continue on current oxygen level.  Goal is to  keep Oxygen level >88-90%.  Hold Advair and Incruse.  Trial of Stiolto 2 puffs daily . Rinse after use.  Stop flonase  Saline nasal rinses and gel As needed   Follow up with Dr Lake Bells in 4-6 weeks and As needed

## 2017-01-01 NOTE — Progress Notes (Signed)
Patient seen in the office today and instructed on use of Stiolto Respimat.  Patient expressed understanding and demonstrated technique. Parke Poisson, CMA 01/01/17

## 2017-01-01 NOTE — Patient Instructions (Signed)
Chest xray today  Continue on current oxygen level.  Goal is to  keep Oxygen level >88-90%.  Hold Advair and Incruse.  Trial of Stiolto 2 puffs daily . Rinse after use.  Stop flonase  Saline nasal rinses and gel As needed   Follow up with Dr Lake Bells in 4-6 weeks and As needed

## 2017-01-02 ENCOUNTER — Telehealth: Payer: Self-pay | Admitting: Pulmonary Disease

## 2017-01-02 ENCOUNTER — Ambulatory Visit (HOSPITAL_COMMUNITY): Payer: Medicare Other

## 2017-01-02 LAB — CULTURE, BLOOD (ROUTINE X 2)
Culture: NO GROWTH
Special Requests: ADEQUATE

## 2017-01-02 NOTE — Telephone Encounter (Signed)
Pt daughter called back to make hosp f/u appt completed TCM call below.../lmb  Transition Care Management Follow-up Telephone Call   Date discharged? 12/30/16   How have you been since you were released from the hospital? Daughter states he seems to be doing ok   Do you understand why you were in the hospital? YES   Do you understand the discharge instructions? YES   Where were you discharged to? [facility])   Items Reviewed:  Medications reviewed: YES, she states no change on maintenance medications but was given antibiotic for UTI sxs  Allergies reviewed: NO  Dietary changes reviewed: NO  Referrals reviewed: No referral needed   Functional Questionnaire:   Activities of Daily Living (ADLs):   She states he are independent in the following: bathing and hygiene, feeding, continence, grooming, toileting and dressing States he require assistance with the following: ambulation   Any transportation issues/concerns?: NO   Any patient concerns? NO   Confirmed importance and date/time of follow-up visits scheduled YES, appt 01/08/17  Provider Appointment booked with Dr. Quay Burow  Confirmed with patient if condition begins to worsen call PCP or go to the ER.  Patient was given the office number and encouraged to call back with question or concerns.  : YES

## 2017-01-02 NOTE — Telephone Encounter (Signed)
PA request received by [employer] for [employer]. Initiated PA request through [email or web address] Key: [id number] Determination is expected within 1-3 business days.  Routing to myself for follow-up.

## 2017-01-03 DIAGNOSIS — R040 Epistaxis: Secondary | ICD-10-CM | POA: Diagnosis not present

## 2017-01-03 DIAGNOSIS — J310 Chronic rhinitis: Secondary | ICD-10-CM | POA: Diagnosis not present

## 2017-01-03 NOTE — Telephone Encounter (Signed)
PA approved.   Pharmacy aware.  Nothing further needed.

## 2017-01-04 ENCOUNTER — Ambulatory Visit (HOSPITAL_COMMUNITY): Payer: Medicare Other

## 2017-01-05 ENCOUNTER — Telehealth: Payer: Self-pay | Admitting: Adult Health

## 2017-01-05 MED ORDER — CEFPODOXIME PROXETIL 200 MG PO TABS: 200.0000 mg | ORAL_TABLET | Freq: Two times a day (BID) | ORAL | 0 refills | Status: DC

## 2017-01-05 NOTE — Telephone Encounter (Signed)
Called spoke with patient's daughter Bradley Bennett regarding a separate matter (scheduling PFT w/ follow up w/ BQ) >> PFT scheduled w/ follow up on 1.25.19 @ 2pm and 3pm  Bradley Bennett also reports that she feels like patient is developing "a cold" - reports a "loose" cough but no mucus production onset today.  Denies any wheezing, tightness, increased SOB, f/c/s, hemoptysis, PND.  Bradley Bennett stated that patient is scheduled to see his PCP on Monday 12.3.18 but she is concerned with waiting over the weekend.  Did advise Bradley Bennett to use Mucinex DM 1-2 twice daily as needed for cough/congestion.  Advised Bradley Bennett will route back to TP to advise on the face mask and his most recent symptoms.

## 2017-01-05 NOTE — Telephone Encounter (Signed)
Spoke with Margarita Grizzle, stating that it was okay for pt to use a face mask and alternate back and forth.  Stated to Margarita Grizzle that if mucus became discolored, to call us back and we could send in an abx.  Margarita Grizzle asked if we could go ahead and send in an abx with the weekend coming up.  TP, please advise on this.  Thanks!

## 2017-01-05 NOTE — Telephone Encounter (Signed)
ATC Margarita Grizzle, no answer. Left message for pt to call back.  Sent in the Rx to [facility].

## 2017-01-05 NOTE — Telephone Encounter (Signed)
According to discharge he is still on abx until tomorrow  Is this not correct??

## 2017-01-05 NOTE — Telephone Encounter (Signed)
That is fine for the face mask to give nose a rest . Can change back and forth.  mucinex As needed  Cough /congestion . As needed   If worse with discolored mucus call back for abx.  Please contact office for sooner follow up if symptoms do not improve or worsen or seek emergency care

## 2017-01-05 NOTE — Telephone Encounter (Addendum)
Left a message for Margarita Grizzle to call us back regarding her father, who is the pt.

## 2017-01-05 NOTE — Telephone Encounter (Signed)
Can extend Vantin for 3 days . 1 Twice daily  For 3 days - take with food, no refills.  Take mucinex dm Twice daily   Please contact office for sooner follow up if symptoms do not improve or worsen or seek emergency care

## 2017-01-05 NOTE — Telephone Encounter (Signed)
Spoke with Bradley Bennett, who states pt will take one tab tonight and one tomorrow to complete course.  TP please advise. Thanks.

## 2017-01-05 NOTE — Telephone Encounter (Signed)
Called and spoke to pt's daughter, Margarita Grizzle. Margarita Grizzle is requesting that order be placed to [facility] for face mask for oxygen, due to nasal cannula causing dry nose.   TP please advise. Thanks.

## 2017-01-07 NOTE — Patient Instructions (Addendum)
Give her a urine sample today to rule out an infection.  Test(s) ordered today. Your results will be released to [place] (or called to you) after review, usually within 72hours after test completion. If any changes need to be made, you will be notified at that same time.  Medications reviewed and updated.   No changes recommended at this time.  Try adding metamucil.  Discuss your memory and bowels with Dr Tat.

## 2017-01-07 NOTE — Progress Notes (Signed)
Subjective:    Patient ID: Bradley Bennett, male    DOB: 1932-06-07, 83 y.o.   MRN: [id number]  HPI The patient is here for follow up from the hospital.  Admitted 12/28/16 - 12/30/16  Recommendations for Outpatient Follow-up:  1. Follow up with PCP in 1-2 weeks 2. Please obtain BMP/CBC in one week 3. Please follow up on the following pending results: Final blood cultures from 11/22 (1 of 2 with CoNS felt to be contaminant).   He went to the emergency room with urinary hesitancy and dysuria.  He was  experiencing generalized weakness and chills.  His daughter found him undressed at his apartment and called EMS.  Per EMS his SBP was in the 80s.  Code sepsis was initiated upon arrival to the emergency room.  Urinalysis suggested infection.  A bladder scan ruled out retention.  He did have leukocytosis.  Chest x-ray showed chronic abnormalities with possible multifocal infiltrate.  He was started on vancomycin and cefepime to cover healthcare associated pneumonia in addition to UTI.  His urine culture did show Klebsiella.  His respiratory status improved to baseline on antibiotics. He was discharged back to home.  He is here with his daughter.   Sepsis due to klebsiella UTI: Started on cefepime and vanco.  Changed to vantin at discharge Blood cultures remained negative - one bottle was positive but thought to be a contaminate He finished the antibiotics a couple days ago.  He has frequent urination and urgency especially at night.  He denies dysuria, hematuria or cloudy urine.   He sees urology and will follow up -- his flomax was changed to proscar due to hypotension  HCAP: multifocal PNA on CXR Vanco discontinued due MRSA swab negative  He was changed from cefepime to Vantin Advised considering repeat imaging after resolution -- CXR on 11/26 showed chronic changes but no PNA He has cold symptoms that started two days prior to finishing the antibiotic.  He did start taking mucinex.   He has nasal congestion, rhinorrhea, sneezing, cough, wheeze and chronic sob that is unchanged.  He denies fever/chills.  He feels the mucinex is starting to move and he thinks his symptoms are a little better.    Continued on home supplemental oxygen Stable during hospital course  chronic hypoxic respiratory failure secondary to COPD and interstitial lung disease No change in oxygen  BPH No urinary retention Recently changed from Flomax to finasteride due to orthostatic hypotension Continue current medication, but monitor Having urinary frequency and urgency - advised follow up with urology  Dysphasia, chronic He did have a mild choking episode Evaluated by  speech pathology and noted to be mild aspiration risk.  No aspiration when cued for aspiration precautions No respiratory changes recommended He does not drink that much -- it is hard for him to swallow liquids  Parkinson's disease Chronic, stable   Memory concerns:  He will forget someone's name easily.  He will know it when they start the meal but will forget it during the meal.  The other day he was not able to tune the radio in, but his daughter showed him the volume just needed to be turned up.  He is also repeating more.  He follows with neurology, Dr Tat, and has not discussed this with her.  He has never been tested.      Loosing bowel control.  Stool are typically firm.  Occurs occasionally - maybe three times a week.  He takes imodium  daily and has been doing that for a while.  It does not seem to working as well.    Medications and allergies reviewed with patient and updated if appropriate.  Patient Active Problem List   Diagnosis Date Noted  . HCAP (healthcare-associated pneumonia) 01/01/2017  . UTI (urinary tract infection) 12/28/2016  . Bleeding from the nose 12/08/2016  . Hypokalemia 10/24/2016  . Bacteremia due to methicillin susceptible Staphylococcus aureus (MSSA) 10/20/2016  . Hyponatremia 10/20/2016  .  Cellulitis 10/19/2016  . Sepsis ([place]) 10/19/2016  . Trigger point of right shoulder region 10/05/2016  . Cervical radiculopathy 09/15/2016  . Chronic respiratory failure ([place]) 08/21/2016  . Chronic right shoulder pain 08/21/2016  . Hypotension 07/15/2016  . Diarrhea 07/14/2016  . Prediabetes 07/13/2016  . Dyspnea 06/15/2016  . Allergic rhinitis 05/25/2016  . COPD exacerbation ([place]) 05/10/2016  . Trigger thumb of both hands 01/15/2016  . COPD (chronic obstructive pulmonary disease) ([place]) 07/16/2015  . Hypoxemia 07/16/2015  . Postinflammatory pulmonary fibrosis ([place]) 07/16/2015  . Parkinson disease ([place]) 07/16/2015  . Hypothyroidism 07/16/2015  . Insomnia 07/16/2015  . Benign prostatic hyperplasia 07/16/2015  . Anxiety 07/16/2015  . Carpal tunnel syndrome, right 07/16/2015  . Chronic lower back pain 07/16/2015  . Macular degeneration 07/16/2015  . CAD in native artery 06/15/2015  . 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 S/P coronary artery stent placement 06/15/2015  . Hyperlipidemia 06/15/2015    Current Outpatient Medications on File Prior to Visit  Medication Sig Dispense Refill  . acetaminophen (TYLENOL) 500 MG tablet Take 500-1,000 mg by mouth every 6 (six) hours as needed for mild pain, moderate pain, fever or headache.     . ADVAIR DISKUS 500-50 MCG/DOSE AEPB INHALE ONE PUFF BY MOUTH INTO THE LUNGS TWO TIMES A DAY 180 each 1  . alfuzosin (UROXATRAL) 10 MG 24 hr tablet Take 1 tablet (10 mg total) by mouth daily. 90 tablet 1  . atorvastatin (LIPITOR) 20 MG tablet Take 1 tablet (20 mg total) by mouth daily. Please make yearly appt with Dr. Radford Pax for December. 1st attempt 30 tablet 0  . busPIRone (BUSPAR) 5 MG tablet TAKE ONE TABLET BY MOUTH TWICE A DAY 180 tablet 0  . carbidopa-levodopa (SINEMET CR) 50-200 MG tablet TAKE ONE TABLET BY MOUTH AT BEDTIME 90 tablet 0  . carbidopa-levodopa (SINEMET IR) 25-100 MG tablet Take 2 tablets by mouth 4 (four) times daily. 720 tablet 1  . finasteride  (PROSCAR) 5 MG tablet Take 1 tablet (5 mg total) by mouth every evening.    . fluticasone (FLONASE) 50 MCG/ACT nasal spray Place 1 spray into both nostrils daily. 48 g 1  . gabapentin (NEURONTIN) 100 MG capsule 100mg  at 8am and 4pm 180 capsule 1  . gabapentin (NEURONTIN) 400 MG capsule Take 1 capsule (400 mg total) by mouth at bedtime. 90 capsule 1  . INCRUSE ELLIPTA 62.5 MCG/INH AEPB INHALE ONE PUFF INTO THE LUNGS DAILY 90 each 1  . lactose free nutrition (BOOST PLUS) LIQD Take 237 mLs by mouth 3 (three) times daily with meals. 30 Can 0  . levothyroxine (SYNTHROID, LEVOTHROID) 150 MCG tablet TAKE ONE TABLET BY MOUTH DAILY (Patient taking differently: TAKE 150MG  BY MOUTH DAILY) 90 tablet 1  . mirabegron ER (MYRBETRIQ) 50 MG TB24 tablet Take 50 mg by mouth every evening.    . Multiple Vitamin (MULTIVITAMIN WITH MINERALS) TABS tablet Take 1 tablet by mouth daily at 12 noon.    . Multiple Vitamins-Minerals (ICAPS AREDS 2 PO) Take  1-2 tablets by mouth See admin instructions. 2 tablets @ noon and 1 tablet @dinner     . sodium chloride (OCEAN) 0.65 % SOLN nasal spray Place 1 spray into both nostrils as needed for congestion. 1 Bottle 2  . Tiotropium Bromide-Olodaterol (STIOLTO RESPIMAT) 2.5-2.5 MCG/ACT AERS Inhale 2 puffs into the lungs daily. 1 Inhaler 0  . Tiotropium Bromide-Olodaterol (STIOLTO RESPIMAT) 2.5-2.5 MCG/ACT AERS Inhale 2 puffs into the lungs daily. 3 Inhaler 1  . traZODone (DESYREL) 100 MG tablet TAKE ONE TABLET BY MOUTH AT BEDTIME 90 tablet 0   No current facility-administered medications on file prior to visit.     Past Medical History:  Diagnosis Date  . Arthritis   . Asthma   . Coronary artery disease   . Emphysema of lung ([place])   . Hearing difficulty of both ears   . Heart attack ([place])   . Heart murmur   . Hyperlipidemia   . Hypertension   . ILD (interstitial lung disease) ([place])   . Parkinson disease [facility])     Past Surgical History:  Procedure Laterality Date  . CARPAL  TUNNEL RELEASE Right 03/2014  . CORONARY ANGIOPLASTY WITH STENT PLACEMENT    . CORONARY ARTERY BYPASS GRAFT  1992  . HERNIA REPAIR  10/2013   x3   . VEIN BYPASS SURGERY      Social History   Socioeconomic History  . Marital status: Divorced    Spouse name: None  . Number of children: None  . Years of education: None  . Highest education level: None  Social Needs  . Financial resource strain: None  . Food insecurity - worry: None  . Food insecurity - inability: None  . Transportation needs - medical: None  . Transportation needs - non-medical: None  Occupational History  . Occupation: retired    Comment: [employer]  Tobacco Use  . Smoking status: Former Smoker    Packs/day: 2.00    Years: 35.00    Pack years: 70.00    Last attempt to quit: 08/15/1985    Years since quitting: 31.4  . Smokeless tobacco: Never Used  . Tobacco comment: quit smoking in 1992  Substance and Sexual Activity  . Alcohol use: Yes    Alcohol/week: 0.0 oz    Comment: twice every 6 months  . Drug use: No  . Sexual activity: No  Other Topics Concern  . None  Social History Narrative  . None    Family History  Problem Relation Age of Onset  . Colon cancer Father   . Stroke Father   . Arthritis Mother   . Arthritis Sister   . Heart disease Brother   . Kidney cancer Brother     Review of Systems  Constitutional: Negative for appetite change, chills and fever.  HENT: Positive for congestion (yellow mucus), rhinorrhea and sneezing. Negative for ear pain and sore throat.   Respiratory: Positive for cough (more than baseline), shortness of breath (chronic ) and wheezing.   Cardiovascular: Negative for chest pain, palpitations and leg swelling.  Gastrointestinal: Positive for abdominal pain (occ, mild). Negative for nausea.  Genitourinary: Positive for frequency and urgency. Negative for difficulty urinating, dysuria and hematuria.  Neurological: Negative for light-headedness and headaches.         Objective:   Vitals:   01/08/17 1031  BP: 116/60  Pulse: (!) 58  Resp: 18  Temp: 97.9 F (36.6 C)  SpO2: 93%   Wt Readings from Last 3 Encounters:  01/08/17 182  lb (82.6 kg)  01/01/17 182 lb 6.4 oz (82.7 kg)  12/28/16 180 lb 1.9 oz (81.7 kg)   Body mass index is 25.75 kg/m.   Physical Exam    Constitutional: chronically ill appearing. No distress.  HENT:  Head: Normocephalic and atraumatic.  Neck, Mouth: Neck supple. No tracheal deviation present. No thyromegaly present.  No cervical lymphadenopathy.  No oropharynx erythema. Cardiovascular: Normal rate, regular rhythm and normal heart sounds.   No murmur heard..  No edema Pulmonary/Chest: Effort normal.  Decreased BS.  No respiratory distress. No has no wheezes. No rales.  Abdomen: soft, non tender, non distended Skin: Skin is warm and dry. Not diaphoretic.  Psychiatric: Normal mood and affect. Behavior is normal.   DG Chest 2 View CLINICAL DATA:  Follow-up pneumonia, COPD.  EXAM: CHEST  2 VIEW  COMPARISON:  12/28/2016  FINDINGS: Severe chronic lung disease and fibrosis throughout the lungs. No definite acute airspace opacity or effusion. Heart is borderline in size. Prior CABG.  IMPRESSION: Severe chronic fibrotic changes throughout the lungs. No acute process.  Electronically Signed   By: Rolm Baptise M.D.   On: 01/01/2017 11:32   Blood work and cultures reviewed.   Assessment & Plan:    HCAP, COPD, postinflammatory pulmonary fibrosis, cold symptoms: Completed the antibiotics - last cxr on 11/26 showed no evidence of infection - just chronic changes Has cold symptoms - mild and they started when she was on the antibiotic -- likely viral in nature Will hold off on additional antibiotics - but we have a very low threshold to start an antibiotic -- his daughter and him will monitor closely and call if symptoms worsen at all   See Problem List for Assessment and Plan of chronic medical problems.

## 2017-01-08 ENCOUNTER — Encounter: Payer: Self-pay | Admitting: Internal Medicine

## 2017-01-08 ENCOUNTER — Ambulatory Visit (INDEPENDENT_AMBULATORY_CARE_PROVIDER_SITE_OTHER): Payer: Medicare Other | Admitting: Internal Medicine

## 2017-01-08 ENCOUNTER — Other Ambulatory Visit (INDEPENDENT_AMBULATORY_CARE_PROVIDER_SITE_OTHER): Payer: Medicare Other

## 2017-01-08 VITALS — BP 116/60 | HR 58 | Temp 97.9°F | Resp 18 | Wt 182.0 lb

## 2017-01-08 DIAGNOSIS — N3000 Acute cystitis without hematuria: Secondary | ICD-10-CM

## 2017-01-08 DIAGNOSIS — J9611 Chronic respiratory failure with hypoxia: Secondary | ICD-10-CM | POA: Diagnosis not present

## 2017-01-08 DIAGNOSIS — R413 Other amnesia: Secondary | ICD-10-CM

## 2017-01-08 DIAGNOSIS — R159 Full incontinence of feces: Secondary | ICD-10-CM | POA: Diagnosis not present

## 2017-01-08 DIAGNOSIS — J8410 Pulmonary fibrosis, unspecified: Secondary | ICD-10-CM | POA: Diagnosis not present

## 2017-01-08 DIAGNOSIS — N401 Enlarged prostate with lower urinary tract symptoms: Secondary | ICD-10-CM | POA: Diagnosis not present

## 2017-01-08 DIAGNOSIS — J189 Pneumonia, unspecified organism: Secondary | ICD-10-CM | POA: Diagnosis not present

## 2017-01-08 LAB — URINALYSIS, ROUTINE W REFLEX MICROSCOPIC
Bilirubin Urine: NEGATIVE
Hgb urine dipstick: NEGATIVE
LEUKOCYTES UA: NEGATIVE
Nitrite: NEGATIVE
RBC / HPF: NONE SEEN (ref 0–?)
TOTAL PROTEIN, URINE-UPE24: NEGATIVE
UROBILINOGEN UA: 0.2 (ref 0.0–1.0)
Urine Glucose: NEGATIVE
pH: 5.5 (ref 5.0–8.0)

## 2017-01-08 NOTE — Assessment & Plan Note (Signed)
Following with urology Changed from flomax to proscar due to hypotension Successfully treated No symptoms consistent with UTI, but still with urgency/frequency - will follow up with urology

## 2017-01-08 NOTE — Assessment & Plan Note (Signed)
Occurs about 3/week Taking imodium daily for loose stools Advised him to add metamucil and see if that helps Discuss with Dr Tat - related to Parkinson's Can refer to GI if needed

## 2017-01-08 NOTE — Assessment & Plan Note (Signed)
He is having memory concerns and it does sound like mild cognitive dysf ? parkinson's or chronic medical problems Encouraged him to discuss with Dr Tat

## 2017-01-08 NOTE — Assessment & Plan Note (Signed)
Successfully treated No symptoms consistent with UTI, but still with urgency/frequency - will follow up with urology

## 2017-01-09 ENCOUNTER — Ambulatory Visit (HOSPITAL_COMMUNITY): Payer: Medicare Other

## 2017-01-09 LAB — URINE CULTURE
MICRO NUMBER:: 81354931
Result:: NO GROWTH
SPECIMEN QUALITY:: ADEQUATE

## 2017-01-09 NOTE — Telephone Encounter (Signed)
AC Laurie, line was busy Pt was seen by his PCP on yesterday 59.3.18 and had completed the additional 3 days of abx and was feeling better and the Bennie Pierini has been removed from pt's med list  Will go ahead and sign off

## 2017-01-11 ENCOUNTER — Ambulatory Visit (HOSPITAL_COMMUNITY): Payer: Medicare Other

## 2017-01-12 ENCOUNTER — Telehealth: Payer: Self-pay | Admitting: Adult Health

## 2017-01-12 NOTE — Telephone Encounter (Signed)
ATC pt, no answer. Left message for pt to call back.  

## 2017-01-12 NOTE — Telephone Encounter (Signed)
Called Caryl Pina and advised to call back to discuss.

## 2017-01-12 NOTE — Telephone Encounter (Addendum)
ATC Caryl Pina, no answer. Left message for pt to call back.

## 2017-01-12 NOTE — Telephone Encounter (Signed)
Bradley Bennett from home health calling back.

## 2017-01-16 ENCOUNTER — Ambulatory Visit (HOSPITAL_COMMUNITY): Payer: Medicare Other

## 2017-01-17 NOTE — Telephone Encounter (Signed)
lmtcb for Bradley Bennett

## 2017-01-18 ENCOUNTER — Ambulatory Visit (HOSPITAL_COMMUNITY): Payer: Medicare Other

## 2017-01-18 DIAGNOSIS — N401 Enlarged prostate with lower urinary tract symptoms: Secondary | ICD-10-CM | POA: Diagnosis not present

## 2017-01-18 DIAGNOSIS — N3281 Overactive bladder: Secondary | ICD-10-CM | POA: Diagnosis not present

## 2017-01-18 DIAGNOSIS — N3000 Acute cystitis without hematuria: Secondary | ICD-10-CM | POA: Diagnosis not present

## 2017-01-18 NOTE — Telephone Encounter (Addendum)
Spoke with Caryl Pina, she states the daughter asked for a mask for oxygen but it is not adequate enough for his liter flow. She states the nasal cannula is making his nose really dry due to the liter flow being high.  At rest 8-10L, with exertion 10-15L  Caryl Pina just wanted to know if we discussed this with pt, I advised her that we sent pt for ENT for recurrent nosebleeds but there was no discussion about mask for his oxygen in the notes.   FYI BQ

## 2017-01-23 ENCOUNTER — Ambulatory Visit (HOSPITAL_COMMUNITY): Payer: Medicare Other

## 2017-01-25 ENCOUNTER — Ambulatory Visit (HOSPITAL_COMMUNITY): Payer: Medicare Other

## 2017-01-25 DIAGNOSIS — N3000 Acute cystitis without hematuria: Secondary | ICD-10-CM | POA: Diagnosis not present

## 2017-01-25 DIAGNOSIS — R351 Nocturia: Secondary | ICD-10-CM | POA: Diagnosis not present

## 2017-01-25 DIAGNOSIS — N3281 Overactive bladder: Secondary | ICD-10-CM | POA: Diagnosis not present

## 2017-01-25 DIAGNOSIS — N401 Enlarged prostate with lower urinary tract symptoms: Secondary | ICD-10-CM | POA: Diagnosis not present

## 2017-01-31 ENCOUNTER — Other Ambulatory Visit: Payer: Self-pay | Admitting: Cardiology

## 2017-02-01 ENCOUNTER — Ambulatory Visit (HOSPITAL_COMMUNITY): Payer: Medicare Other

## 2017-02-01 DIAGNOSIS — N3000 Acute cystitis without hematuria: Secondary | ICD-10-CM | POA: Diagnosis not present

## 2017-02-01 DIAGNOSIS — N281 Cyst of kidney, acquired: Secondary | ICD-10-CM | POA: Diagnosis not present

## 2017-02-08 ENCOUNTER — Ambulatory Visit (HOSPITAL_COMMUNITY): Payer: Medicare Other

## 2017-02-13 ENCOUNTER — Ambulatory Visit (HOSPITAL_COMMUNITY): Payer: Medicare Other

## 2017-02-14 ENCOUNTER — Other Ambulatory Visit: Payer: Self-pay | Admitting: Neurology

## 2017-02-14 MED ORDER — CARBIDOPA-LEVODOPA ER 50-200 MG PO TBCR: 1.0000 | EXTENDED_RELEASE_TABLET | Freq: Every day | ORAL | 1 refills | Status: DC

## 2017-02-14 MED ORDER — CARBIDOPA-LEVODOPA 25-100 MG PO TABS: 2.0000 | ORAL_TABLET | Freq: Four times a day (QID) | ORAL | 1 refills | Status: DC

## 2017-02-15 ENCOUNTER — Telehealth: Payer: Self-pay | Admitting: Internal Medicine

## 2017-02-15 ENCOUNTER — Ambulatory Visit (HOSPITAL_COMMUNITY): Payer: Medicare Other

## 2017-02-15 NOTE — Telephone Encounter (Signed)
He should come in so we can look at it and start antibiotics.

## 2017-02-15 NOTE — Telephone Encounter (Signed)
Copied from [place] [id number]. Topic: Quick Communication - See Telephone Encounter >> Feb 15, 2017  1:04 PM Percell Belt A wrote: CRM for notification. See Telephone encounter for: pt Daughter called in and is concerned that pt may have a staff infection, on the same toe and the top part of his foot.  She can also smell a order to it.  She wants to know what the next step would be? She said that it look pretty bad   02/15/17.

## 2017-02-15 NOTE — Progress Notes (Signed)
Subjective:    Patient ID: Bradley Bennett, male    DOB: Feb 28, 1932, 84 y.o.   MRN: [id number]  HPI He is here for an acute visit.   Foot infection: A few months ago, 10/18, he had a right toe infection that was MSSA, but that resulted in bacteremia.  He did follow with Dr. Lorin Mercy at [facility] and needed to have an area debrided.  He is not currently following with orthopedics or podiatry.  His daughter noticed changes in his feet yesterday.  She monitors his feet and cuts his toenails.  There was an odor from the right foot, but no obvious discharge.  He is a area of redness/papules on the dorsal aspect of his right foot.  The area is not tender, but he does have neuropathy and is not able to feel pain in his foot.  The medial aspect of his right toe was also erythematous, but today looks more purple.  This is where the infection started a few months ago.  He does have a callus formation on the bottom of the first toe, which was debrided during his prior infection.  He denies any pain or numbness/tingling.  He denies any trauma, but may not remember trauma.  He does have what appears to be a bruise on the left first toenail that appears to be growing out.  He thinks he may have injured the toe.  He denies any pain.  His fourth toe on the left foot has a long, thickened toenail and some mild redness on the dorsal aspect of the toe.  There is no swelling or pain.  He denies any fevers or chills.  His daughter had noticed an increased cough that sounds wet and increased wheezing.  He has not heard this or noticed it.  He is chronically short of breath and this is not changed.  She notieced changes yesterday.  There was an odor.  Redness/purpliness in right toe and redeness dorsal aspect of right dorsal  Aspect.  Pos doro. No discharge    Medications and allergies reviewed with patient and updated if appropriate.  Patient Active Problem List   Diagnosis Date Noted  . Memory changes  01/08/2017  . Bowel incontinence 01/08/2017  . HCAP (healthcare-associated pneumonia) 01/01/2017  . UTI (urinary tract infection) 12/28/2016  . Bleeding from the nose 12/08/2016  . Hypokalemia 10/24/2016  . Bacteremia due to methicillin susceptible Staphylococcus aureus (MSSA) 10/20/2016  . Hyponatremia 10/20/2016  . Cellulitis 10/19/2016  . Sepsis ([place]) 10/19/2016  . Trigger point of right shoulder region 10/05/2016  . Cervical radiculopathy 09/15/2016  . Chronic respiratory failure ([place]) 08/21/2016  . Chronic right shoulder pain 08/21/2016  . Hypotension 07/15/2016  . Diarrhea 07/14/2016  . Prediabetes 07/13/2016  . Dyspnea 06/15/2016  . Allergic rhinitis 05/25/2016  . COPD exacerbation ([place]) 05/10/2016  . Trigger thumb of both hands 01/15/2016  . COPD (chronic obstructive pulmonary disease) ([place]) 07/16/2015  . Hypoxemia 07/16/2015  . Postinflammatory pulmonary fibrosis ([place]) 07/16/2015  . Parkinson disease ([place]) 07/16/2015  . Hypothyroidism 07/16/2015  . Insomnia 07/16/2015  . Benign prostatic hyperplasia 07/16/2015  . Anxiety 07/16/2015  . Carpal tunnel syndrome, right 07/16/2015  . Chronic lower back pain 07/16/2015  . Macular degeneration 07/16/2015  . CAD in native artery 06/15/2015  . 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 S/P coronary artery stent placement 06/15/2015  . Hyperlipidemia 06/15/2015    Current Outpatient Medications on File Prior to Visit  Medication Sig Dispense  Refill  . acetaminophen (TYLENOL) 500 MG tablet Take 500-1,000 mg by mouth every 6 (six) hours as needed for mild pain, moderate pain, fever or headache.     . ADVAIR DISKUS 500-50 MCG/DOSE AEPB INHALE ONE PUFF BY MOUTH INTO THE LUNGS TWO TIMES A DAY 180 each 1  . alfuzosin (UROXATRAL) 10 MG 24 hr tablet Take 1 tablet (10 mg total) by mouth daily. 90 tablet 1  . busPIRone (BUSPAR) 5 MG tablet TAKE ONE TABLET BY MOUTH TWICE A DAY 180 tablet 0  . carbidopa-levodopa (SINEMET CR) 50-200 MG tablet Take 1  tablet by mouth at bedtime. 90 tablet 1  . carbidopa-levodopa (SINEMET IR) 25-100 MG tablet Take 2 tablets by mouth 4 (four) times daily. 720 tablet 1  . finasteride (PROSCAR) 5 MG tablet Take 1 tablet (5 mg total) by mouth every evening.    . fluticasone (FLONASE) 50 MCG/ACT nasal spray Place 1 spray into both nostrils daily. 48 g 1  . gabapentin (NEURONTIN) 100 MG capsule 100mg  at 8am and 4pm 180 capsule 1  . gabapentin (NEURONTIN) 400 MG capsule Take 1 capsule (400 mg total) by mouth at bedtime. 90 capsule 1  . INCRUSE ELLIPTA 62.5 MCG/INH AEPB INHALE ONE PUFF INTO THE LUNGS DAILY 90 each 1  . lactose free nutrition (BOOST PLUS) LIQD Take 237 mLs by mouth 3 (three) times daily with meals. 30 Can 0  . levothyroxine (SYNTHROID, LEVOTHROID) 150 MCG tablet TAKE ONE TABLET BY MOUTH DAILY (Patient taking differently: TAKE 150MG  BY MOUTH DAILY) 90 tablet 1  . mirabegron ER (MYRBETRIQ) 50 MG TB24 tablet Take 50 mg by mouth every evening.    . Multiple Vitamin (MULTIVITAMIN WITH MINERALS) TABS tablet Take 1 tablet by mouth daily at 12 noon.    . Multiple Vitamins-Minerals (ICAPS AREDS 2 PO) Take 1-2 tablets by mouth See admin instructions. 2 tablets @ noon and 1 tablet @dinner     . sodium chloride (OCEAN) 0.65 % SOLN nasal spray Place 1 spray into both nostrils as needed for congestion. 1 Bottle 2  . Tiotropium Bromide-Olodaterol (STIOLTO RESPIMAT) 2.5-2.5 MCG/ACT AERS Inhale 2 puffs into the lungs daily. 1 Inhaler 0  . Tiotropium Bromide-Olodaterol (STIOLTO RESPIMAT) 2.5-2.5 MCG/ACT AERS Inhale 2 puffs into the lungs daily. 3 Inhaler 1  . traZODone (DESYREL) 100 MG tablet TAKE ONE TABLET BY MOUTH AT BEDTIME 90 tablet 0  . trimethoprim (TRIMPEX) 100 MG tablet TAKE 1 TABLET BY MOUTH EVERYDAY AT BEDTIME  11   No current facility-administered medications on file prior to visit.     Past Medical History:  Diagnosis Date  . Arthritis   . Asthma   . Coronary artery disease   . Emphysema of lung ([place])     . Hearing difficulty of both ears   . Heart attack ([place])   . Heart murmur   . Hyperlipidemia   . Hypertension   . ILD (interstitial lung disease) ([place])   . Parkinson disease [facility])     Past Surgical History:  Procedure Laterality Date  . CARPAL TUNNEL RELEASE Right 03/2014  . CORONARY ANGIOPLASTY WITH STENT PLACEMENT    . CORONARY ARTERY BYPASS GRAFT  1992  . HERNIA REPAIR  10/2013   x3   . VEIN BYPASS SURGERY      Social History   Socioeconomic History  . Marital status: Divorced    Spouse name: None  . Number of children: None  . Years of education: None  . Highest education level: None  Social Needs  . Financial resource strain: None  . Food insecurity - worry: None  . Food insecurity - inability: None  . Transportation needs - medical: None  . Transportation needs - non-medical: None  Occupational History  . Occupation: retired    Comment: [employer]  Tobacco Use  . Smoking status: Former Smoker    Packs/day: 2.00    Years: 35.00    Pack years: 70.00    Last attempt to quit: 08/15/1985    Years since quitting: 31.5  . Smokeless tobacco: Never Used  . Tobacco comment: quit smoking in 1992  Substance and Sexual Activity  . Alcohol use: Yes    Alcohol/week: 0.0 oz    Comment: twice every 6 months  . Drug use: No  . Sexual activity: No  Other Topics Concern  . None  Social History Narrative  . None    Family History  Problem Relation Age of Onset  . Colon cancer Father   . Stroke Father   . Arthritis Mother   . Arthritis Sister   . Heart disease Brother   . Kidney cancer Brother     Review of Systems  Constitutional: Negative for chills and fever.  Respiratory: Positive for cough, shortness of breath and wheezing.   Cardiovascular: Negative for chest pain, palpitations and leg swelling.  Skin: Positive for color change (Bilateral feet). Negative for wound.  Neurological: Negative for light-headedness and headaches.       Objective:   Vitals:    02/16/17 1042  BP: 116/60  Pulse: 99  Resp: 16  Temp: 97.8 F (36.6 C)  SpO2: 91%   Wt Readings from Last 3 Encounters:  02/16/17 181 lb (82.1 kg)  01/08/17 182 lb (82.6 kg)  01/01/17 182 lb 6.4 oz (82.7 kg)   Body mass index is 25.6 kg/m.   Physical Exam  Constitutional: He is oriented to person, place, and time. He appears well-developed and well-nourished. No distress.  HENT:  Head: Normocephalic and atraumatic.  Eyes: Conjunctivae are normal.  Cardiovascular: Normal rate and regular rhythm.  Pulmonary/Chest: Effort normal. No respiratory distress. He has no wheezes. He has no rales.  Musculoskeletal: He exhibits no edema.  Neurological: He is alert and oriented to person, place, and time.  Skin: He is not diaphoretic.  Patch of erythema with some papules right dorsal midfoot that is nontender and without swelling; right first toe, lateral aspect area of purplish discoloration with slight fluctuance, nontender, no open wound or discharge, plantar surface of right first toe callus formation which is chronic from previous infection-nontender no discharge; bruise left first toenail-nontender, left fourth toe with thickened toenail and mild area of erythema on dorsal aspect of toe distally-nontender, no swelling            Assessment & Plan:    See Problem List for Assessment and Plan of chronic medical problems.

## 2017-02-15 NOTE — Telephone Encounter (Signed)
Spoke with pts daughter, appt schedule to see Dr Quay Burow tomorrow.

## 2017-02-16 ENCOUNTER — Encounter: Payer: Self-pay | Admitting: Internal Medicine

## 2017-02-16 ENCOUNTER — Ambulatory Visit (INDEPENDENT_AMBULATORY_CARE_PROVIDER_SITE_OTHER): Payer: Medicare Other | Admitting: Internal Medicine

## 2017-02-16 VITALS — BP 116/60 | HR 99 | Temp 97.8°F | Resp 16 | Wt 181.0 lb

## 2017-02-16 DIAGNOSIS — L089 Local infection of the skin and subcutaneous tissue, unspecified: Secondary | ICD-10-CM | POA: Diagnosis not present

## 2017-02-16 DIAGNOSIS — J441 Chronic obstructive pulmonary disease with (acute) exacerbation: Secondary | ICD-10-CM

## 2017-02-16 MED ORDER — ATORVASTATIN CALCIUM 20 MG PO TABS: ORAL_TABLET | ORAL | 1 refills | Status: DC

## 2017-02-16 MED ORDER — DOXYCYCLINE HYCLATE 100 MG PO TABS: 100.0000 mg | ORAL_TABLET | Freq: Two times a day (BID) | ORAL | 0 refills | Status: DC

## 2017-02-16 MED ORDER — MUPIROCIN 2 % EX OINT: TOPICAL_OINTMENT | CUTANEOUS | 2 refills | Status: DC

## 2017-02-16 NOTE — Assessment & Plan Note (Signed)
His lungs sound fairly good, but his symptoms are suggestive of a COPD exacerbation On chronic oxygen We will be starting doxycycline for possible toe infection, which will cover anything that could be starting in the lungs if bacterial Monitor symptoms, call if no improvement

## 2017-02-16 NOTE — Assessment & Plan Note (Signed)
Concern for recurrent right first toe infection We will start doxycycline twice daily times 10 days He is a couple of areas concerning on both feet May need to follow-up with Dr. Lorin Mercy Will refer to podiatry for general foot care and for follow-up of his current acute issues His daughter will continue to monitor closely

## 2017-02-16 NOTE — Patient Instructions (Signed)
  Medications reviewed and updated.  Changes include starting doxycycline for the toe infection.  Your prescription(s) have been submitted to your pharmacy. Please take as directed and contact our office if you believe you are having problem(s) with the medication(s).  A referral was ordered for podiatry

## 2017-02-19 ENCOUNTER — Ambulatory Visit: Payer: Medicare Other | Admitting: Pulmonary Disease

## 2017-02-20 ENCOUNTER — Ambulatory Visit (HOSPITAL_COMMUNITY): Payer: Medicare Other

## 2017-02-20 ENCOUNTER — Encounter: Payer: Self-pay | Admitting: Internal Medicine

## 2017-02-20 DIAGNOSIS — L089 Local infection of the skin and subcutaneous tissue, unspecified: Secondary | ICD-10-CM

## 2017-02-20 MED ORDER — AMOXICILLIN-POT CLAVULANATE 875-125 MG PO TABS: 1.0000 | ORAL_TABLET | Freq: Two times a day (BID) | ORAL | 0 refills | Status: DC

## 2017-02-21 ENCOUNTER — Encounter (INDEPENDENT_AMBULATORY_CARE_PROVIDER_SITE_OTHER): Payer: Self-pay | Admitting: Orthopaedic Surgery

## 2017-02-21 ENCOUNTER — Ambulatory Visit (INDEPENDENT_AMBULATORY_CARE_PROVIDER_SITE_OTHER): Payer: Medicare Other | Admitting: Orthopaedic Surgery

## 2017-02-21 VITALS — BP 110/65 | HR 99 | Ht 70.0 in | Wt 180.0 lb

## 2017-02-21 DIAGNOSIS — L84 Corns and callosities: Secondary | ICD-10-CM

## 2017-02-21 NOTE — Progress Notes (Signed)
Office Visit Note   Patient: Bradley Bennett           Date of Birth: 16-Jul-1932           MRN: [id number] Visit Date: 02/21/2017              Requested by: [name], MD [address], [place] [id number] PCP: [name], MD   Assessment & Plan: Visit Diagnoses:  1. Foot callus     Plan: Calluses removed as described below.  Stop his antibiotics.  Regular soap water and after drying his foot he can apply lotion.  This will help with the dry skin.  He does not have any evidence of infection.  Care discussed return as needed  Follow-Up Instructions: No Follow-up on file.   Orders:  No orders of the defined types were placed in this encounter.  No orders of the defined types were placed in this encounter.     Procedures: No procedures performed   Clinical Data: No additional findings.   Subjective: Chief Complaint  Patient presents with  . Right Foot - Follow-up    HPI 84 year old male returns for follow-up after cellulitis involving great toe.  Midfoot which was read and brought in by family member for evaluation.  He has thickened callus and some scar over the medial aspect of the great toe foot is slightly red scars present which is 2 mm round this was also peeled off no soft tissue swelling no cellulitis.  He had been started on antibiotics peroxide been being applied to his foot , she feels no pain in his foot.  Sock in a regular shoe.  Denies chills or fever.  Review of Systems is updated 14 point from his last office visit 12/06/2016 and is on   Objective: Vital Signs: BP 110/65   Pulse 99   Ht 5\' 10"  (1.778 m)   Wt 180 lb (81.6 kg)   BMI 25.83 kg/m   Physical Exam  Constitutional: He is oriented to person, place, and time. He appears well-developed and well-nourished.  HENT:  Head: Normocephalic and atraumatic.  Eyes: EOM are normal. Pupils are equal, round, and reactive to light.  Neck: No tracheal deviation present. No thyromegaly  present.  Cardiovascular: Normal rate.  Pulmonary/Chest: Effort normal. He has no wheezes.  Abdominal: Soft. Bowel sounds are normal.  Neurological: He is alert and oriented to person, place, and time.  Skin: Skin is warm and dry. Capillary refill takes less than 2 seconds.  Psychiatric: He has a normal mood and affect. His behavior is normal. Judgment and thought content normal.    Ortho Exam 8 x 4 mm callus with eschar present on the medial aspect of the great toe at the IP joint this is peeled off the toe using gloves.  Eschar over the dorsum of the foot 2 mm in size is removed.  Skin is dry.  No cellulitis.  No drainage.  No tenosynovitis.  No lymphadenopathy in the lower extremity.  No venous stasis changes.  Pulses palpable dorsalis pedis and posterior tibial.  Specialty Comments:  No specialty comments available.  Imaging: No results found.   PMFS History: Patient Active Problem List   Diagnosis Date Noted  . Toe infection 02/16/2017  . Memory changes 01/08/2017  . Bowel incontinence 01/08/2017  . HCAP (healthcare-associated pneumonia) 01/01/2017  . UTI (urinary tract infection) 12/28/2016  . Bleeding from the nose 12/08/2016  . Hypokalemia 10/24/2016  . Bacteremia  due to methicillin susceptible Staphylococcus aureus (MSSA) 10/20/2016  . Hyponatremia 10/20/2016  . Cellulitis 10/19/2016  . Sepsis ([place]) 10/19/2016  . Trigger point of right shoulder region 10/05/2016  . Cervical radiculopathy 09/15/2016  . Chronic respiratory failure ([place]) 08/21/2016  . Chronic right shoulder pain 08/21/2016  . Hypotension 07/15/2016  . Diarrhea 07/14/2016  . Prediabetes 07/13/2016  . Dyspnea 06/15/2016  . Allergic rhinitis 05/25/2016  . COPD exacerbation ([place]) 05/10/2016  . Trigger thumb of both hands 01/15/2016  . COPD (chronic obstructive pulmonary disease) ([place]) 07/16/2015  . Hypoxemia 07/16/2015  . Postinflammatory pulmonary fibrosis ([place]) 07/16/2015  . Parkinson disease ([place])  07/16/2015  . Hypothyroidism 07/16/2015  . Insomnia 07/16/2015  . Benign prostatic hyperplasia 07/16/2015  . Anxiety 07/16/2015  . Carpal tunnel syndrome, right 07/16/2015  . Chronic lower back pain 07/16/2015  . Macular degeneration 07/16/2015  . CAD in native artery 06/15/2015  . 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 S/P coronary artery stent placement 06/15/2015  . Hyperlipidemia 06/15/2015   Past Medical History:  Diagnosis Date  . Arthritis   . Asthma   . Coronary artery disease   . Emphysema of lung ([place])   . Hearing difficulty of both ears   . Heart attack ([place])   . Heart murmur   . Hyperlipidemia   . Hypertension   . ILD (interstitial lung disease) ([place])   . Parkinson disease ([place])     Family History  Problem Relation Age of Onset  . Colon cancer Father   . Stroke Father   . Arthritis Mother   . Arthritis Sister   . Heart disease Brother   . Kidney cancer Brother     Past Surgical History:  Procedure Laterality Date  . CARPAL TUNNEL RELEASE Right 03/2014  . CORONARY ANGIOPLASTY WITH STENT PLACEMENT    . CORONARY ARTERY BYPASS GRAFT  1992  . HERNIA REPAIR  10/2013   x3   . VEIN BYPASS SURGERY     Social History   Occupational History  . Occupation: retired    Comment: [employer]  Tobacco Use  . Smoking status: Former Smoker    Packs/day: 2.00    Years: 35.00    Pack years: 70.00    Last attempt to quit: 08/15/1985    Years since quitting: 31.5  . Smokeless tobacco: Never Used  . Tobacco comment: quit smoking in 1992  Substance and Sexual Activity  . Alcohol use: Yes    Alcohol/week: 0.0 oz    Comment: twice every 6 months  . Drug use: No  . Sexual activity: No

## 2017-02-22 ENCOUNTER — Ambulatory Visit (HOSPITAL_COMMUNITY): Payer: Medicare Other

## 2017-02-22 ENCOUNTER — Ambulatory Visit: Payer: Medicare Other | Admitting: Internal Medicine

## 2017-02-27 ENCOUNTER — Ambulatory Visit (HOSPITAL_COMMUNITY): Payer: Medicare Other

## 2017-03-01 ENCOUNTER — Ambulatory Visit (HOSPITAL_COMMUNITY): Payer: Medicare Other

## 2017-03-01 ENCOUNTER — Other Ambulatory Visit: Payer: Self-pay | Admitting: Pulmonary Disease

## 2017-03-01 DIAGNOSIS — J441 Chronic obstructive pulmonary disease with (acute) exacerbation: Secondary | ICD-10-CM

## 2017-03-02 ENCOUNTER — Ambulatory Visit: Payer: Medicare Other | Admitting: Pulmonary Disease

## 2017-03-02 ENCOUNTER — Ambulatory Visit (INDEPENDENT_AMBULATORY_CARE_PROVIDER_SITE_OTHER): Payer: Medicare Other | Admitting: Pulmonary Disease

## 2017-03-02 ENCOUNTER — Encounter: Payer: Self-pay | Admitting: Pulmonary Disease

## 2017-03-02 VITALS — BP 128/68 | HR 82 | Ht 70.0 in | Wt 180.0 lb

## 2017-03-02 DIAGNOSIS — J8410 Pulmonary fibrosis, unspecified: Secondary | ICD-10-CM

## 2017-03-02 DIAGNOSIS — G20 Parkinson's disease: Secondary | ICD-10-CM | POA: Diagnosis not present

## 2017-03-02 DIAGNOSIS — J9611 Chronic respiratory failure with hypoxia: Secondary | ICD-10-CM

## 2017-03-02 DIAGNOSIS — J441 Chronic obstructive pulmonary disease with (acute) exacerbation: Secondary | ICD-10-CM

## 2017-03-02 NOTE — Addendum Note (Signed)
Addended by: Len Blalock on: 03/02/2017 04:09 PM   Modules accepted: Orders

## 2017-03-02 NOTE — Progress Notes (Signed)
Subjective:    Patient ID: Bradley Bennett, male    DOB: January 02, 1933, 84 y.o.   MRN: [id number]  Synopsis: On her fibrosis and possible COPD with a history of chronic aspiration in the setting of Parkinson's disease. Uses 3-4 L of oxygen continuously.  He was started on oxygen around 2013 after his diagnosis of COPD in 2013.  He was diagnosed with an ILD around 45 in [place]. A modified barium swallow in 2015 showed aspiration.  He smoked 35 years, 2 ppd, quit around 1992.   HPI Chief Complaint  Patient presents with  . Follow-up    pt states he is doing well, family member notes that he has increased wheezing and sinus congestion.    Bradley Bennett says he is OK. He feels more winded when he is walking around.  He feels more congestion in his chest when he is walking around. He is still using and benefiting from his oxygen.  He is getting plenty of oxygen from the [place] right now.  He says his chest congestion hasn't worsend.  He has some ulceration in his nose and he would like to wear a mask.  The nasal cannula prongs have made this worse.    They are using Advair and Incruise.    He is interested in going back to start exercise with a group called [employer].    Past Medical History:  Diagnosis Date  . Arthritis   . Asthma   . Coronary artery disease   . Emphysema of lung ([place])   . Hearing difficulty of both ears   . Heart attack ([place])   . Heart murmur   . Hyperlipidemia   . Hypertension   . ILD (interstitial lung disease) ([place])   . Parkinson disease ([place])          Review of Systems  Constitutional: Positive for fatigue. Negative for chills and fever.  HENT: Positive for postnasal drip, rhinorrhea and sinus pressure. Negative for sinus pain.   Respiratory: Positive for cough and shortness of breath. Negative for wheezing.   Cardiovascular: Negative for chest pain, palpitations and leg swelling.  Gastrointestinal: Negative for abdominal distention, constipation, diarrhea and  nausea.  Musculoskeletal: Negative for arthralgias, gait problem, joint swelling and neck stiffness.  Neurological: Negative for seizures, speech difficulty, light-headedness, numbness and headaches.       Objective:   Physical Exam Vitals:   03/02/17 1509  BP: 128/68  Pulse: 82  SpO2: 93%  Weight: 180 lb (81.6 kg)  Height: 5\' 10"  (1.778 m)   6 L continuous  Gen: well appearing HENT: OP clear, TM's clear, neck supple PULM: Mostly clear, some crackles in bases B, normal percussion CV: RRR, no mgr, trace edema GI: BS+, soft, nontender Derm: no cyanosis or rash Psyche: normal mood and affect    BMET    Component Value Date/Time   NA 139 12/30/2016 0519   K 4.0 12/30/2016 0519   CL 106 12/30/2016 0519   CO2 27 12/30/2016 0519   GLUCOSE 133 (H) 12/30/2016 0519   BUN 18 12/30/2016 0519   CREATININE 0.57 (L) 12/30/2016 0519   CALCIUM 8.6 (L) 12/30/2016 0519   GFRNONAA >60 12/30/2016 0519   GFRAA >60 12/30/2016 [facility] records from September 2018 reviewed were he was treated for a staph skin and soft tissue infection and bacteremia.  Multiple conversations with DME companies and the [place] were held today by me personally while the patient was in clinic  Assessment & Plan:  No diagnosis found.  Discussion: In general this is been a stable interval for Bradley Bennett.  I do not have evidence today that his lung disease has worsened but clearly he has severe respiratory failure with hypoxemia secondary to centrilobular emphysema and postinflammatory fibrosis.  I believe that his fibrosis is due to recurrent aspiration.  Is also got a course complicated by significant sinus congestion.  He has had some nasal sores secondary to this.  He would do better with a mask for his oxygen use.  Plan: Chronic respiratory failure with hypoxemia: We will order a partial nonrebreather mask to use for oxygen because the nasal cannulas have given you sores in your nose He was  6 L of oxygen at rest, 15 L with exertion We will check an ambulatory oxygen test while you are walking today  Centrilobular emphysema: Continue Advair and Incruise for now Stay active, exercise regularly  Pulmonary fibrosis from recurrent aspiration: Continue to use caution while swallowing  We will see you back in 3 months or sooner if needed  Greater than 50% of this 26-minute visit spent face-to-face     Current Outpatient Medications:  .  acetaminophen (TYLENOL) 500 MG tablet, Take 500-1,000 mg by mouth every 6 (six) hours as needed for mild pain, moderate pain, fever or headache. , Disp: , Rfl:  .  ADVAIR DISKUS 500-50 MCG/DOSE AEPB, INHALE ONE PUFF BY MOUTH INTO THE LUNGS TWO TIMES A DAY, Disp: 180 each, Rfl: 1 .  alfuzosin (UROXATRAL) 10 MG 24 hr tablet, Take 1 tablet (10 mg total) by mouth daily., Disp: 90 tablet, Rfl: 1 .  atorvastatin (LIPITOR) 20 MG tablet, TAKE ONE TABLET BY MOUTH DAILY **PLEASE MAKE YEARLY APPOINTMENT WTIH DR. Radford Pax FOR DECEMBER - 1ST ATTEMPT**, Disp: 90 tablet, Rfl: 1 .  busPIRone (BUSPAR) 5 MG tablet, TAKE ONE TABLET BY MOUTH TWICE A DAY, Disp: 180 tablet, Rfl: 0 .  carbidopa-levodopa (SINEMET CR) 50-200 MG tablet, Take 1 tablet by mouth at bedtime., Disp: 90 tablet, Rfl: 1 .  carbidopa-levodopa (SINEMET IR) 25-100 MG tablet, Take 2 tablets by mouth 4 (four) times daily., Disp: 720 tablet, Rfl: 1 .  doxycycline (VIBRA-TABS) 100 MG tablet, Take 1 tablet (100 mg total) by mouth 2 (two) times daily., Disp: 20 tablet, Rfl: 0 .  finasteride (PROSCAR) 5 MG tablet, Take 1 tablet (5 mg total) by mouth every evening., Disp: , Rfl:  .  fluticasone (FLONASE) 50 MCG/ACT nasal spray, Place 1 spray into both nostrils daily., Disp: 48 g, Rfl: 1 .  gabapentin (NEURONTIN) 100 MG capsule, 100mg  at 8am and 4pm, Disp: 180 capsule, Rfl: 1 .  gabapentin (NEURONTIN) 400 MG capsule, Take 1 capsule (400 mg total) by mouth at bedtime., Disp: 90 capsule, Rfl: 1 .  INCRUSE ELLIPTA  62.5 MCG/INH AEPB, INHALE ONE PUFF INTO THE LUNGS DAILY, Disp: 90 each, Rfl: 1 .  lactose free nutrition (BOOST PLUS) LIQD, Take 237 mLs by mouth 3 (three) times daily with meals., Disp: 30 Can, Rfl: 0 .  levothyroxine (SYNTHROID, LEVOTHROID) 150 MCG tablet, TAKE ONE TABLET BY MOUTH DAILY (Patient taking differently: TAKE 150MG  BY MOUTH DAILY), Disp: 90 tablet, Rfl: 1 .  mirabegron ER (MYRBETRIQ) 50 MG TB24 tablet, Take 50 mg by mouth every evening., Disp: , Rfl:  .  Multiple Vitamin (MULTIVITAMIN WITH MINERALS) TABS tablet, Take 1 tablet by mouth daily at 12 noon., Disp: , Rfl:  .  Multiple Vitamins-Minerals (ICAPS AREDS 2 PO), Take 1-2  tablets by mouth See admin instructions. 2 tablets @ noon and 1 tablet @dinner , Disp: , Rfl:  .  mupirocin ointment (BACTROBAN) 2 %, Apply to affected area twice daily, Disp: 22 g, Rfl: 2 .  sodium chloride (OCEAN) 0.65 % SOLN nasal spray, Place 1 spray into both nostrils as needed for congestion., Disp: 1 Bottle, Rfl: 2 .  Tiotropium Bromide-Olodaterol (STIOLTO RESPIMAT) 2.5-2.5 MCG/ACT AERS, Inhale 2 puffs into the lungs daily., Disp: 1 Inhaler, Rfl: 0 .  Tiotropium Bromide-Olodaterol (STIOLTO RESPIMAT) 2.5-2.5 MCG/ACT AERS, Inhale 2 puffs into the lungs daily., Disp: 3 Inhaler, Rfl: 1 .  traZODone (DESYREL) 100 MG tablet, TAKE ONE TABLET BY MOUTH AT BEDTIME, Disp: 90 tablet, Rfl: 0 .  trimethoprim (TRIMPEX) 100 MG tablet, TAKE 1 TABLET BY MOUTH EVERYDAY AT BEDTIME, Disp: , Rfl: 11

## 2017-03-02 NOTE — Patient Instructions (Signed)
Chronic respiratory failure with hypoxemia: We will order a partial nonrebreather mask to use for oxygen because the nasal cannulas have given you sores in your nose He was 6 L of oxygen at rest, 15 L with exertion We will check an ambulatory oxygen test while you are walking today  Centrilobular emphysema: Continue Advair and Incruise for now Stay active, exercise regularly  Pulmonary fibrosis from recurrent aspiration: Continue to use caution while swallowing  We will see you back in 3 months or sooner if needed  Greater than 50% of this 26-minute visit spent face-to-face

## 2017-03-02 NOTE — Progress Notes (Signed)
PFT not completed today due to being scheduled wrong.

## 2017-03-05 ENCOUNTER — Encounter: Payer: Self-pay | Admitting: Cardiology

## 2017-03-05 DIAGNOSIS — I10 Essential (primary) hypertension: Secondary | ICD-10-CM | POA: Insufficient documentation

## 2017-03-05 NOTE — Progress Notes (Signed)
Cardiology Office Note:    Date:  03/06/2017   ID:  ROC STREETT, DOB 1932-08-16, MRN [id number]  PCP:  [name], MD  Cardiologist:  No primary care provider on file.    Referring MD: [name], MD   Chief Complaint  Patient presents with  . Coronary Artery Disease  . Hyperlipidemia    History of Present Illness:    Bradley Bennett is a 84 y.o. male with a hx of ASCAD with MI in 62 and subsequently underwent 4 vessel CABG in [place].  Since then he has had several PCIs with the last one being 2015.  He has chronic SOB from ILD and is on chronic O2 and follows with Dr. [place] Bells.  She is here today for followup and is doing well from a cardiac standpoint.  He has had several hospitalizations since September.  He ran out of O2 and had a respiratory arrest and was brought back.  He had several UTIs and PNA as well.  He denies any chest pain or pressure, PND, orthopnea, LE edema, dizziness, palpitations or syncope. He has chronic DOE but stable on O2.  He is compliant with her meds and is tolerating meds with no SE.    Past Medical History:  Diagnosis Date  . Arthritis   . Asthma   . Coronary artery disease    ASCAD with MI in 1992 and subsequently underwent 4 vessel CABG in [place].  Since then he has had several PCIs with the last one being 2015.    . Emphysema of lung ([place])   . Hearing difficulty of both ears   . Hyperlipidemia   . Hypertension   . ILD (interstitial lung disease) ([place])   . Parkinson disease [facility])     Past Surgical History:  Procedure Laterality Date  . CARPAL TUNNEL RELEASE Right 03/2014  . CORONARY ANGIOPLASTY WITH STENT PLACEMENT    . CORONARY ARTERY BYPASS GRAFT  1992  . HERNIA REPAIR  10/2013   x3   . VEIN BYPASS SURGERY      Current Medications: Current Meds  Medication Sig  . acetaminophen (TYLENOL) 500 MG tablet Take 500-1,000 mg by mouth every 6 (six) hours as needed for mild pain, moderate pain, fever or headache.   . ADVAIR  DISKUS 500-50 MCG/DOSE AEPB INHALE ONE PUFF BY MOUTH INTO THE LUNGS TWO TIMES A DAY  . alfuzosin (UROXATRAL) 10 MG 24 hr tablet Take 1 tablet (10 mg total) by mouth daily.  Marland Kitchen atorvastatin (LIPITOR) 20 MG tablet TAKE ONE TABLET BY MOUTH DAILY **PLEASE MAKE YEARLY APPOINTMENT WTIH DR. Radford Pax FOR DECEMBER - 1ST ATTEMPT**  . busPIRone (BUSPAR) 5 MG tablet TAKE ONE TABLET BY MOUTH TWICE A DAY  . carbidopa-levodopa (SINEMET CR) 50-200 MG tablet Take 1 tablet by mouth at bedtime.  . carbidopa-levodopa (SINEMET IR) 25-100 MG tablet Take 2 tablets by mouth 4 (four) times daily.  . finasteride (PROSCAR) 5 MG tablet Take 1 tablet (5 mg total) by mouth every evening.  . fluticasone (FLONASE) 50 MCG/ACT nasal spray Place 1 spray into both nostrils daily.  Marland Kitchen gabapentin (NEURONTIN) 100 MG capsule 100mg  at 8am and 4pm  . gabapentin (NEURONTIN) 400 MG capsule Take 1 capsule (400 mg total) by mouth at bedtime.  . INCRUSE ELLIPTA 62.5 MCG/INH AEPB INHALE ONE PUFF INTO THE LUNGS DAILY  . lactose free nutrition (BOOST PLUS) LIQD Take 237 mLs by mouth 3 (three) times daily with meals.  Marland Kitchen levothyroxine (SYNTHROID, LEVOTHROID)  150 MCG tablet TAKE ONE TABLET BY MOUTH DAILY (Patient taking differently: TAKE 150MG  BY MOUTH DAILY)  . mirabegron ER (MYRBETRIQ) 50 MG TB24 tablet Take 50 mg by mouth every evening.  . Multiple Vitamin (MULTIVITAMIN WITH MINERALS) TABS tablet Take 1 tablet by mouth daily at 12 noon.  . Multiple Vitamins-Minerals (ICAPS AREDS 2 PO) Take 1-2 tablets by mouth See admin instructions. 2 tablets @ noon and 1 tablet @dinner   . mupirocin ointment (BACTROBAN) 2 % Apply to affected area twice daily  . sodium chloride (OCEAN) 0.65 % SOLN nasal spray Place 1 spray into both nostrils as needed for congestion.  . Tiotropium Bromide-Olodaterol (STIOLTO RESPIMAT) 2.5-2.5 MCG/ACT AERS Inhale 2 puffs into the lungs daily.  . Tiotropium Bromide-Olodaterol (STIOLTO RESPIMAT) 2.5-2.5 MCG/ACT AERS Inhale 2 puffs into  the lungs daily.  . traZODone (DESYREL) 100 MG tablet TAKE ONE TABLET BY MOUTH AT BEDTIME  . trimethoprim (TRIMPEX) 100 MG tablet TAKE 1 TABLET BY MOUTH EVERYDAY AT BEDTIME     Allergies:   Isosorbide   Social History   Socioeconomic History  . Marital status: Divorced    Spouse name: None  . Number of children: None  . Years of education: None  . Highest education level: None  Social Needs  . Financial resource strain: None  . Food insecurity - worry: None  . Food insecurity - inability: None  . Transportation needs - medical: None  . Transportation needs - non-medical: None  Occupational History  . Occupation: retired    Comment: [employer]  Tobacco Use  . Smoking status: Former Smoker    Packs/day: 2.00    Years: 35.00    Pack years: 70.00    Last attempt to quit: 08/15/1985    Years since quitting: 31.5  . Smokeless tobacco: Never Used  . Tobacco comment: quit smoking in 1992  Substance and Sexual Activity  . Alcohol use: Yes    Alcohol/week: 0.0 oz    Comment: twice every 6 months  . Drug use: No  . Sexual activity: No  Other Topics Concern  . None  Social History Narrative  . None     Family History: The patient's family history includes Arthritis in his mother and sister; Colon cancer in his father; Heart disease in his brother; Kidney cancer in his brother; Stroke in his father.  ROS:  Please see the history of present illness.    ROS  All other systems reviewed and negative.   EKGs/Labs/Other Studies Reviewed:    The following studies were reviewed today: none  EKG:  EKG is not ordered today.    Recent Labs: 05/10/2016: B Natriuretic Peptide 187.2 07/14/2016: TSH 2.88 10/24/2016: Magnesium 1.7 10/26/2016: Pro B Natriuretic peptide (BNP) 39.0 12/28/2016: ALT <5 12/30/2016: BUN 18; Creatinine, Ser 0.57; Hemoglobin 11.0; Platelets 235; Potassium 4.0; Sodium 139   Recent Lipid Panel    Component Value Date/Time   CHOL 137 03/27/2016 1127   TRIG 144  03/27/2016 1127   HDL 50 03/27/2016 1127   CHOLHDL 2.7 03/27/2016 1127   CHOLHDL 3.0 02/04/2016 0916   VLDL 20 02/04/2016 0916   LDLCALC 58 03/27/2016 1127    Physical Exam:    VS:  BP 114/63 Comment: Right Arm 2nd time getting BP  Pulse 83   Ht 5\' 10"  (1.778 m)   Wt 180 lb 12.8 oz (82 kg)   SpO2 (!) 87% Comment: 98% after putting oxygen on 10  BMI 25.94 kg/m     Wt Readings  from Last 3 Encounters:  03/06/17 180 lb 12.8 oz (82 kg)  03/02/17 180 lb (81.6 kg)  02/21/17 180 lb (81.6 kg)     GEN:  Well nourished, well developed in no acute distress HEENT: Normal NECK: No JVD; No carotid bruits LYMPHATICS: No lymphadenopathy CARDIAC: RRR, no murmurs, rubs, gallops RESPIRATORY:  Clear to auscultation without rales, wheezing or rhonchi  ABDOMEN: Soft, non-tender, non-distended MUSCULOSKELETAL:  No edema; No deformity  SKIN: Warm and dry NEUROLOGIC:  Alert and oriented x 3 PSYCHIATRIC:  Normal affect   ASSESSMENT:    1. CAD in native artery   2. Pure hypercholesterolemia   3. Benign essential HTN    PLAN:    In order of problems listed above:  1.  ASCAD - s/p  MI in 1992 and subsequently underwent 4 vessel CABG in [place].  Since then he has had several PCIs with the last one being 2015.  He denies any anginal chest pain.  He will continue on statin.  He stopped Plavix due to significant skin bleeding.  I instructed him to start ASA 81mg  daily.   2.  Hyperlipidemia with LDL goal < 70.  He will continue on atorvastatin 20mg  daily.  I will get an FLP and ALT.  3.  HTN - BP well controlled on exam today.       Medication Adjustments/Labs and Tests Ordered: Current medicines are reviewed at length with the patient today.  Concerns regarding medicines are outlined above.  No orders of the defined types were placed in this encounter.  No orders of the defined types were placed in this encounter.   Signed, Fransico Him, MD  03/06/2017 8:46 AM    [place]

## 2017-03-06 ENCOUNTER — Ambulatory Visit (HOSPITAL_COMMUNITY): Payer: Medicare Other

## 2017-03-06 ENCOUNTER — Ambulatory Visit (INDEPENDENT_AMBULATORY_CARE_PROVIDER_SITE_OTHER): Payer: Medicare Other | Admitting: Cardiology

## 2017-03-06 ENCOUNTER — Encounter: Payer: Self-pay | Admitting: Cardiology

## 2017-03-06 ENCOUNTER — Telehealth: Payer: Self-pay

## 2017-03-06 VITALS — BP 114/63 | HR 83 | Ht 70.0 in | Wt 180.8 lb

## 2017-03-06 DIAGNOSIS — I10 Essential (primary) hypertension: Secondary | ICD-10-CM

## 2017-03-06 DIAGNOSIS — E7800 Pure hypercholesterolemia, unspecified: Secondary | ICD-10-CM

## 2017-03-06 DIAGNOSIS — I2510 Atherosclerotic heart disease of native coronary artery without angina pectoris: Secondary | ICD-10-CM

## 2017-03-06 LAB — PULMONARY FUNCTION TEST
FEF 25-75 PRE: 2.18 L/s
FEF2575-%PRED-PRE: 116 %
FEV1-%PRED-PRE: 94 %
FEV1-Pre: 2.67 L
FEV1FVC-%PRED-PRE: 106 %
FEV6-%Pred-Pre: 94 %
FEV6-PRE: 3.52 L
FEV6FVC-%Pred-Pre: 107 %
FVC-%PRED-PRE: 88 %
FVC-Pre: 3.52 L
Pre FEV1/FVC ratio: 76 %
Pre FEV6/FVC Ratio: 100 %

## 2017-03-06 LAB — LIPID PANEL
Chol/HDL Ratio: 2.5 ratio (ref 0.0–5.0)
Cholesterol, Total: 114 mg/dL (ref 100–199)
HDL: 46 mg/dL (ref >39)
LDL CALC: 53 mg/dL (ref 0–99)
TRIGLYCERIDES: 76 mg/dL (ref 0–149)
VLDL CHOLESTEROL CAL: 15 mg/dL (ref 5–40)

## 2017-03-06 LAB — HEPATIC FUNCTION PANEL
ALBUMIN: 3.9 g/dL (ref 3.5–4.7)
ALT: 5 IU/L (ref 0–44)
AST: 17 IU/L (ref 0–40)
Alkaline Phosphatase: 98 IU/L (ref 39–117)
BILIRUBIN, DIRECT: 0.14 mg/dL (ref 0.00–0.40)
Bilirubin Total: 0.4 mg/dL (ref 0.0–1.2)
Total Protein: 6.6 g/dL (ref 6.0–8.5)

## 2017-03-06 MED ORDER — ATORVASTATIN CALCIUM 20 MG PO TABS: 20.0000 mg | ORAL_TABLET | Freq: Every day | ORAL | 3 refills | Status: DC

## 2017-03-06 MED ORDER — ASPIRIN EC 81 MG PO TBEC: 81.0000 mg | DELAYED_RELEASE_TABLET | Freq: Every day | ORAL | 3 refills | Status: AC

## 2017-03-06 NOTE — Patient Instructions (Signed)
Medication Instructions:  Your physician has recommended you make the following change in your medication:  START: aspirin 81 mg once a day    Labwork: Today for liver function, and fasting lipids  Testing/Procedures: None ordered   Follow-Up: Your physician wants you to follow-up in: 1 year with Dr. Radford Pax. You will receive a reminder letter in the mail two months in advance. If you don't receive a letter, please call our office to schedule the follow-up appointment.  Any Other Special Instructions Will Be Listed Below (If Applicable).     If you need a refill on your cardiac medications before your next appointment, please call your pharmacy.

## 2017-03-06 NOTE — Telephone Encounter (Signed)
Patient daughter requesting a refill on atorvastatin via mail order. Atorvastatin 20 mg once a day sent to postal prescription. She verbalized understanding and thanked me for the call.

## 2017-03-08 ENCOUNTER — Ambulatory Visit (HOSPITAL_COMMUNITY): Payer: Medicare Other

## 2017-03-12 ENCOUNTER — Ambulatory Visit (INDEPENDENT_AMBULATORY_CARE_PROVIDER_SITE_OTHER): Payer: Medicare Other | Admitting: Podiatry

## 2017-03-12 ENCOUNTER — Encounter: Payer: Self-pay | Admitting: Podiatry

## 2017-03-12 DIAGNOSIS — M79676 Pain in unspecified toe(s): Secondary | ICD-10-CM

## 2017-03-12 DIAGNOSIS — L309 Dermatitis, unspecified: Secondary | ICD-10-CM

## 2017-03-12 DIAGNOSIS — B351 Tinea unguium: Secondary | ICD-10-CM | POA: Diagnosis not present

## 2017-03-12 MED ORDER — CLOBETASOL PROPIONATE 0.05 % EX OINT: 1.0000 "application " | TOPICAL_OINTMENT | Freq: Two times a day (BID) | CUTANEOUS | 1 refills | Status: DC

## 2017-03-12 NOTE — Progress Notes (Signed)
   HPI: 84 year old male presents today for evaluation of a blister and a rash to the right great toe as well as the dorsum of the right foot.  Patient states that he does have a history of a staph infection back in September 2018 at which time he was treated with oral antibiotics.  Patient states that they just recently finished oral doxycycline approximately 1 week ago. Patient was also seen by Dr. Inda Merlin, orthopedist, approximately 3 weeks ago.  Patient is currently wearing venous compression socks but has a hard time getting them on and off.  He presents today for second opinion for further treatment evaluation  Past Medical History:  Diagnosis Date  . Arthritis   . Asthma   . Coronary artery disease    ASCAD with MI in 1992 and subsequently underwent 4 vessel CABG in [place].  Since then he has had several PCIs with the last one being 2015.    . Emphysema of lung ([place])   . Hearing difficulty of both ears   . Hyperlipidemia   . Hypertension   . ILD (interstitial lung disease) ([place])   . Parkinson disease [facility])           Physical Exam: General: The patient is alert and oriented x3 in no acute distress.  Dermatology: Painful hyperkeratotic dystrophic nails noted 1-5 bilateral.  Skin is warm, dry and supple bilateral lower extremities.  There is a very localized superficial skin dermatitis without any obvious open lesions to the foot measuring approximately 5.0 x 4.0 cm in length times width. There is also an intact superficial blister type lesion noted grabbing the dorsum and medial aspect of the right great toe.  Negative for any significant erythema or edema.  These lesions do not appear to be bacterial infection by nature.  Vascular: Palpable pedal pulses bilaterally. No edema or erythema noted. Capillary refill within normal limits.  Neurological: Epicritic and protective threshold grossly intact bilaterally.   Musculoskeletal Exam: Range of motion within normal limits to all  pedal and ankle joints bilateral. Muscle strength 5/5 in all groups bilateral.   Assessment: -Dermatitis right dorsal foot -Blister lesion right great toe Pain due to onychomycosis of toenails-   Plan of Care:  -Patient was evaluated today -Mechanical debridement of nails 1-5 was performed using a nail nipper without incident or bleeding -Recommend the patient continue mupirocin ointment in combination with Temovate ointment.  Prescription for Temovate ointment placed today -Return to clinic in 3 weeks   Edrick Kins, DPM [facility]  Dr. Edrick Kins, DPM    [address], [place] [address]                Office [phone]  Fax [phone]

## 2017-03-13 ENCOUNTER — Ambulatory Visit (HOSPITAL_COMMUNITY): Payer: Medicare Other

## 2017-03-14 ENCOUNTER — Telehealth: Payer: Self-pay | Admitting: *Deleted

## 2017-03-14 NOTE — Telephone Encounter (Signed)
[facility] form states Medicare will not cover Temovate 0.05%, routed message to Dr. March Rummage.

## 2017-03-15 ENCOUNTER — Ambulatory Visit (HOSPITAL_COMMUNITY): Payer: Medicare Other

## 2017-03-15 NOTE — Telephone Encounter (Signed)
Temovate is a steroidal topical antiinflammatory. Econazole is an antifungal topical.  Just have him use OTC hydrocortisone cream. Thanks, Dr. Amalia Hailey

## 2017-03-15 NOTE — Telephone Encounter (Signed)
Not familiar with this patient

## 2017-03-16 NOTE — Telephone Encounter (Signed)
I spoke with dtr, Margarita Grizzle and informed of Dr. Amalia Hailey orders to purchase OTC Hydrocortsone cream. Margarita Grizzle asked if she was to mix it with the Mupirocin as Dr. Amalia Hailey had ordered and I told her continue with Dr. Amalia Hailey orders.

## 2017-03-20 ENCOUNTER — Ambulatory Visit (HOSPITAL_COMMUNITY): Payer: Medicare Other

## 2017-03-22 ENCOUNTER — Ambulatory Visit (HOSPITAL_COMMUNITY): Payer: Medicare Other

## 2017-03-23 ENCOUNTER — Other Ambulatory Visit: Payer: Self-pay | Admitting: Pulmonary Disease

## 2017-03-23 ENCOUNTER — Other Ambulatory Visit: Payer: Self-pay | Admitting: Internal Medicine

## 2017-03-27 ENCOUNTER — Ambulatory Visit (HOSPITAL_COMMUNITY): Payer: Medicare Other

## 2017-03-27 NOTE — Progress Notes (Signed)
Bradley Bennett was seen today in the movement disorders clinic for neurologic consultation at the request of Burns, [name], MD.  The consultation is for the evaluation of PD.  This patient is accompanied in the office by his child who supplements the history.  Pt just moved here from [place].  I don't have any previous neurology records.  Pt reports that he was dx with PD about 4 years ago per pt and about 10 years ago per daughter.  His first sx was L hand tremor.  Daughter states he was on something else prior to the levodopa but she isn't sure that it was specifically for PD.  He is currently on carbidopa/levodopa 25/100, 1.5 tablets tid (8:30am/5:30/9pm).  Daughter thinks that the med wears off   11/16/15 update: The patient follows up today, accompanied by his daughter who supplements the history.  Last visit, we talked about changing his dosing of his medication, so that he takes carbidopa/levodopa 25/100, 2 tablets at 8 AM/noon/4 PM and then we added carbidopa/levodopa 50/200 at bedtime.  Daughter thinks that it helps.  He had an MRI of the brain and cervical spine since our last visit.  The MRI of the brain demonstrated mild to moderate white matter disease.  The MRI of the cervical spine demonstrated degenerative changes and spinal stenosis, but nothing surgical noted.  He has not had any falls since last visit.  No hallucinations.  No lightheadedness or near syncope.  He is using melatonin, 10 mg, for sleep and trazodone for sleep.  Doing pulm rehab on tues/thurs but daughter states that more active on the other days.  He is doing Wii bowling.  No hallucinations.  He lives in independent assisted living but he does his own medication.  His daughter prepares the pill box and he has no trouble remembering to take the medication.    03/24/16 update:  Patient follows up today, accompanied by his daughter supplements the history.  He is on carbidopa/levodopa 25/100, 2 tablet at 8 AM/noon/4 PM and  carbidopa/levodopa 50/200 at bedtime.  Noting some tremor in the leg.  Daughter fills his pill box and noting a lot of extra pills at the end of the week.  Patient admits that he forgot the middle of the day dose today (supposed to take at noon and it is 3pm).  He has had no falls.  No hallucinations.  No lightheadedness or near syncope.  Not able to exercise because of pulmonary constraints.  In pulmonary rehab and reviewed those records.  He remains in independent assisted living. Daughter quietly mentions to me that Dr. [place] Bells mentioned hospice.  Noting paresthesias of the hands and feet.    08/23/16 update: Patient seen today in follow-up, accompanied by his daughter who supplements the history.  Patient is on carbidopa/levodopa 25/100, 2 tablets at 8 AM/noon/4 PM and carbidopa/levodopa 50/200 at bed. Has been remembering to take meds.  Has been experiencing low blood pressure and saw PCP on 08/21/16 for this.   The records that were made available to me were reviewed.  Has been attending pulm rehab.  His O2 goes down to 70% there.  His daughter is trying to get him to accept hospice.  He is not ready to do that yet.  12/04/16 update: Patient is seen today in follow-up, accompanied by his daughter who supplements the history.  The patient is on carbidopa/levodopa 25/100, 2 tablets at 8 AM/noon/4 p.m.  He is on carbidopa/levodopa 50/200 at bedtime.  Pt denies falls.  Pt denies lightheadedness, near syncope.  No hallucinations.  Mood has been good.  In sept, he went into pulm arrest and required bag valve mask ventilation and then came back with sepsis.  The records that were made available to me were reviewed.  He has had more tremor.  Daughter states that not taking more lung meds.  He has not tried more levodopa.  His memory ha suffered.  He is living in independent assisted living but daughter is there all the time.  Daughter has applied for VA benefits to bring in some help for management of meds/O2.   He  lives at [facility].  Just finished PT and that helped.  He now has a scooter as he has the farthest apartment from the dining hall and he uses all the o2 just getting to the dining room.  No hallucinations.  Not choking on foods but slow to swallow and therefore taking in less calories.  04/05/17 update: The patient is seen today in follow-up, accompanied by his daughter who supplements the history.  Patient is on carbidopa/levodopa 25/100, 2 tablets 4 times per day (an increase) and carbidopa/levodopa 50/200 at bedtime. He reports that he is still only taking carbidopa/levodopa 25/100, 2 po tid (7am/11am/3pm) and carbidopa/levodopa 50/200 at bed because he goes to bed so early (8:30).  Daughter notes more weak and tremulous.   Records have been reviewed since last visit.  He has seen orthopedics regarding cellulitis on his toe which is now resolved.  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  He saw cardiology in January and he was instructed to start baby aspirin due to the fact that he stopped Plavix due to significant bleeding.  PREVIOUS MEDICATIONS: Sinemet  ALLERGIES:   Allergies  Allergen Reactions  . Isosorbide Other (See Comments)    Reaction:  Headaches     CURRENT MEDICATIONS:  Outpatient Encounter Medications as of 04/05/2017  Medication Sig  . acetaminophen (TYLENOL) 500 MG tablet Take 500-1,000 mg by mouth every 6 (six) hours as needed for mild pain, moderate pain, fever or headache.   . ADVAIR DISKUS 500-50 MCG/DOSE AEPB INHALE ONE PUFF BY MOUTH INTO THE LUNGS TWO TIMES A DAY  . alfuzosin (UROXATRAL) 10 MG 24 hr tablet Take 1 tablet (10 mg total) by mouth daily.  Marland Kitchen aspirin EC 81 MG tablet Take 1 tablet (81 mg total) by mouth daily.  Marland Kitchen atorvastatin (LIPITOR) 20 MG tablet Take 1 tablet (20 mg total) by mouth daily at 6 PM.  . busPIRone (BUSPAR) 5 MG tablet TAKE ONE TABLET BY MOUTH TWICE A DAY  .  carbidopa-levodopa (SINEMET CR) 50-200 MG tablet Take 1 tablet by mouth at bedtime.  . carbidopa-levodopa (SINEMET IR) 25-100 MG tablet Take 2 tablets by mouth 4 (four) times daily.  . clobetasol ointment (TEMOVATE) 4.85 % Apply 1 application topically 2 (two) times daily.  . entacapone (COMTAN) 200 MG tablet Take 1 tablet (200 mg total) by mouth 3 (three) times daily.  . finasteride (PROSCAR) 5 MG tablet Take 1 tablet (5 mg total) by mouth every evening.  . fluticasone (FLONASE) 50 MCG/ACT nasal spray Place 1 spray into both nostrils daily.  Marland Kitchen gabapentin (NEURONTIN) 100 MG capsule 100mg  at 8am and 4pm  . gabapentin (NEURONTIN) 400 MG capsule Take 1 capsule (400 mg total) by mouth at bedtime.  . INCRUSE ELLIPTA 62.5 MCG/INH AEPB INHALE ONE PUFF INTO THE LUNGS DAILY  .  lactose free nutrition (BOOST PLUS) LIQD Take 237 mLs by mouth 3 (three) times daily with meals.  Marland Kitchen levothyroxine (SYNTHROID, LEVOTHROID) 150 MCG tablet TAKE ONE TABLET BY MOUTH DAILY  . mirabegron ER (MYRBETRIQ) 50 MG TB24 tablet Take 50 mg by mouth every evening.  . Multiple Vitamin (MULTIVITAMIN WITH MINERALS) TABS tablet Take 1 tablet by mouth daily at 12 noon.  . Multiple Vitamins-Minerals (ICAPS AREDS 2 PO) Take 1-2 tablets by mouth See admin instructions. 2 tablets @ noon and 1 tablet @dinner   . mupirocin ointment (BACTROBAN) 2 % Apply to affected area twice daily  . sodium chloride (OCEAN) 0.65 % SOLN nasal spray Place 1 spray into both nostrils as needed for congestion.  . Tiotropium Bromide-Olodaterol (STIOLTO RESPIMAT) 2.5-2.5 MCG/ACT AERS Inhale 2 puffs into the lungs daily.  . Tiotropium Bromide-Olodaterol (STIOLTO RESPIMAT) 2.5-2.5 MCG/ACT AERS Inhale 2 puffs into the lungs daily.  . traZODone (DESYREL) 100 MG tablet TAKE ONE TABLET BY MOUTH AT BEDTIME  . trimethoprim (TRIMPEX) 100 MG tablet TAKE 1 TABLET BY MOUTH EVERYDAY AT BEDTIME   No facility-administered encounter medications on file as of 04/05/2017.     PAST  MEDICAL HISTORY:   Past Medical History:  Diagnosis Date  . Arthritis   . Asthma   . Coronary artery disease    ASCAD with MI in 1992 and subsequently underwent 4 vessel CABG in [place].  Since then he has had several PCIs with the last one being 2015.    . Emphysema of lung ([place])   . Hearing difficulty of both ears   . Hyperlipidemia   . Hypertension   . ILD (interstitial lung disease) ([place])   . Parkinson disease ([place])     PAST SURGICAL HISTORY:   Past Surgical History:  Procedure Laterality Date  . CARPAL TUNNEL RELEASE Right 03/2014  . CORONARY ANGIOPLASTY WITH STENT PLACEMENT    . CORONARY ARTERY BYPASS GRAFT  1992  . HERNIA REPAIR  10/2013   x3   . VEIN BYPASS SURGERY      SOCIAL HISTORY:   Social History   Socioeconomic History  . Marital status: Divorced    Spouse name: Not on file  . Number of children: Not on file  . Years of education: Not on file  . Highest education level: Not on file  Social Needs  . Financial resource strain: Not on file  . Food insecurity - worry: Not on file  . Food insecurity - inability: Not on file  . Transportation needs - medical: Not on file  . Transportation needs - non-medical: Not on file  Occupational History  . Occupation: retired    Comment: [employer]  Tobacco Use  . Smoking status: Former Smoker    Packs/day: 2.00    Years: 35.00    Pack years: 70.00    Last attempt to quit: 08/15/1985    Years since quitting: 31.6  . Smokeless tobacco: Never Used  . Tobacco comment: quit smoking in 1992  Substance and Sexual Activity  . Alcohol use: Yes    Alcohol/week: 0.0 oz    Comment: twice every 6 months  . Drug use: No  . Sexual activity: No  Other Topics Concern  . Not on file  Social History Narrative  . Not on file    FAMILY HISTORY:   Family Status  Relation Name Status  . Father  Deceased       stroke, colon cancer  . Mother  Deceased  arthritis, ruptured spleen  . Brother  Deceased       MI, DM, kidney  cancer  . Sister  Alive       lupus  . Sister  Alive       heart disease, DM  . Daughter  Alive       2, healthy  . Son  Alive       1, healthy  . Sister  (Not Specified)  . Brother  (Not Specified)    ROS:  A complete 10 system review of systems was obtained and was unremarkable apart from what is mentioned above.  PHYSICAL EXAMINATION:    VITALS:   Vitals:   04/05/17 0910  BP: 128/68  Pulse: 72  SpO2: 98%     GEN:  The patient appears stated age and is in NAD. HEENT:  Normocephalic, atraumatic.  The mucous membranes are moist. The superficial temporal arteries are without ropiness or tenderness. CV:  RRR Lungs:  CTAB.  He is wearing O2 Neck/HEME:  There are no carotid bruits bilaterally.  Neurological examination:  Orientation:  Montreal Cognitive Assessment  08/16/2015  Visuospatial/ Executive (0/5) 5  Naming (0/3) 3  Attention: Read list of digits (0/2) 2  Attention: Read list of letters (0/1) 1  Attention: Serial 7 subtraction starting at 100 (0/3) 3  Language: Repeat phrase (0/2) 2  Language : Fluency (0/1) 1  Abstraction (0/2) 2  Delayed Recall (0/5) 2  Orientation (0/6) 6  Total 27  Adjusted Score (based on education) 27   Cranial nerves: There is good facial symmetry.  The visual fields are full to confrontational testing. The speech is fluent and clear. Soft palate rises symmetrically and there is no tongue deviation. Hearing is decreased to conversational tone. Sensation: Sensation is intact to light touch throughout. Motor: Strength is 5/5 in the bilateral upper and lower extremities.   Shoulder shrug is equal and symmetric.  There is no pronator drift.  Movement examination: Tone: There is no increased tone in the UE's.     The tone in the lower extremities is normal.  Abnormal movements: There is intermittent LUE resting tremor. Coordination:  There is good RAMs in the UE bilaterally Gait and Station: did not formally assess because he no longer  ambulates much because of hypoxia and shortness of breath (same as previous)  ASSESSMENT/PLAN:  1.  idiopathic Parkinson's disease.  The patient has tremor, bradykinesia, rigidity and Minimal postural instability.  -carbidopa/levodopa 25/100, so that he takes 2 tablets at 7 AM/11 AM/3 PM/7 PM (takes that 7pm prn depending on bedtime)  -He will continue carbidopa/levodopa 50/200 at bedtime.  -add entacapone 200 mg tid with each daytime dose of carbidopa/levodopa.  Risks, benefits, side effects and alternative therapies were discussed.  The opportunity to ask questions was given and they were answered to the best of my ability.  The patient expressed understanding and willingness to follow the outlined treatment protocols.  -He really cannot exercise because of pulmonary disease, but he doesn't wish to pursue hospice yet.  I think that most of his limitations are related to pulm disease and not PD.    - He is trying to use resources through the [facility].  2.  Hyperreflexia  -MRI of the brain just demonstrated mild to moderate white matter disease and MRI the cervical spine demonstrates degenerative changes, but no cervical surgical lesions.  3.  Dysphagia with aspiration  -Per pulm records, pt does have a hx of dysphagia  with chronic aspiration/post inflammatory pulm fibrosis but refuses thickening agents.  His last swallow study per the patient was over a year ago, but if he is unwilling to follow recommendations, there is no use repeating it.  Overall, the patient does not feel that he is aspirating or has significant dysphagia any longer, but he does think it is taking him a long time to eat and thinks that he has trouble getting in calories because of this.  We talked about trying to use Ensure or boost so long as he keeps it away from timing of levodopa.  -pt is DNR  4.  Orthostasis  -improved.  Now off of flomax  5.  Hypoxia secondary to lung disease  -He is using oxygen and now  has a scooter to get around.  6.   Follow up is anticipated in the next few months, sooner should new neurologic issues arise.

## 2017-03-28 DIAGNOSIS — G20 Parkinson's disease: Secondary | ICD-10-CM | POA: Diagnosis not present

## 2017-03-28 DIAGNOSIS — R2689 Other abnormalities of gait and mobility: Secondary | ICD-10-CM | POA: Diagnosis not present

## 2017-03-28 DIAGNOSIS — M6281 Muscle weakness (generalized): Secondary | ICD-10-CM | POA: Diagnosis not present

## 2017-03-29 ENCOUNTER — Ambulatory Visit (HOSPITAL_COMMUNITY): Payer: Medicare Other

## 2017-03-29 DIAGNOSIS — M6281 Muscle weakness (generalized): Secondary | ICD-10-CM | POA: Diagnosis not present

## 2017-03-29 DIAGNOSIS — G20 Parkinson's disease: Secondary | ICD-10-CM | POA: Diagnosis not present

## 2017-03-29 DIAGNOSIS — R2689 Other abnormalities of gait and mobility: Secondary | ICD-10-CM | POA: Diagnosis not present

## 2017-03-30 ENCOUNTER — Ambulatory Visit: Payer: Medicare Other | Admitting: Neurology

## 2017-03-30 DIAGNOSIS — R2689 Other abnormalities of gait and mobility: Secondary | ICD-10-CM | POA: Diagnosis not present

## 2017-03-30 DIAGNOSIS — M6281 Muscle weakness (generalized): Secondary | ICD-10-CM | POA: Diagnosis not present

## 2017-03-30 DIAGNOSIS — G20 Parkinson's disease: Secondary | ICD-10-CM | POA: Diagnosis not present

## 2017-04-02 ENCOUNTER — Ambulatory Visit (INDEPENDENT_AMBULATORY_CARE_PROVIDER_SITE_OTHER): Payer: Medicare Other | Admitting: Podiatry

## 2017-04-02 DIAGNOSIS — L309 Dermatitis, unspecified: Secondary | ICD-10-CM | POA: Diagnosis not present

## 2017-04-02 DIAGNOSIS — G20 Parkinson's disease: Secondary | ICD-10-CM | POA: Diagnosis not present

## 2017-04-02 DIAGNOSIS — R2689 Other abnormalities of gait and mobility: Secondary | ICD-10-CM | POA: Diagnosis not present

## 2017-04-02 DIAGNOSIS — M6281 Muscle weakness (generalized): Secondary | ICD-10-CM | POA: Diagnosis not present

## 2017-04-03 ENCOUNTER — Ambulatory Visit (HOSPITAL_COMMUNITY): Payer: Medicare Other

## 2017-04-03 DIAGNOSIS — G20 Parkinson's disease: Secondary | ICD-10-CM | POA: Diagnosis not present

## 2017-04-03 DIAGNOSIS — M6281 Muscle weakness (generalized): Secondary | ICD-10-CM | POA: Diagnosis not present

## 2017-04-03 DIAGNOSIS — R2689 Other abnormalities of gait and mobility: Secondary | ICD-10-CM | POA: Diagnosis not present

## 2017-04-03 NOTE — Progress Notes (Signed)
   HPI: 84 year old male presents today for follow up evaluation of dermatitis of the right foot. He and his caregiver state the foot looks as if it has improved some. There are no modifying factors note. He has been using Hydrocortisone ointment as directed as well as Mupirocin ointment. Patient is here for further evaluation and treatment.   Past Medical History:  Diagnosis Date  . Arthritis   . Asthma   . Coronary artery disease    ASCAD with MI in 1992 and subsequently underwent 4 vessel CABG in [place].  Since then he has had several PCIs with the last one being 2015.    . Emphysema of lung ([place])   . Hearing difficulty of both ears   . Hyperlipidemia   . Hypertension   . ILD (interstitial lung disease) ([place])   . Parkinson disease [facility])      Physical Exam: General: The patient is alert and oriented x3 in no acute distress.  Dermatology: Painful hyperkeratotic dystrophic nails noted 1-5 bilateral. Skin is warm, dry and supple bilateral lower extremities. There is a very localized superficial skin dermatitis without any obvious open lesions to the foot measuring approximately 5.0 x 4.0 cm in length times width. There is also an intact superficial blister type lesion noted grabbing the dorsum and medial aspect of the right great toe. Negative for any significant erythema or edema.  These lesions do not appear to be bacterial infection by nature.  Vascular: Palpable pedal pulses bilaterally. No edema or erythema noted. Capillary refill within normal limits.  Neurological: Epicritic and protective threshold grossly intact bilaterally.   Musculoskeletal Exam: Range of motion within normal limits to all pedal and ankle joints bilateral. Muscle strength 5/5 in all groups bilateral.   Assessment: - Dermatitis right foot - improved  Plan of Care:  - Patient was evaluated today. - Continue using OTC hydrocortisone ointment and Mupirocin ointment daily. - Recommended good shoe gear. -  Return to clinic as needed.    Edrick Kins, DPM [facility]  Dr. Edrick Kins, DPM    [address], [place] [address]                Office [phone]  Fax [phone]

## 2017-04-04 DIAGNOSIS — M6281 Muscle weakness (generalized): Secondary | ICD-10-CM | POA: Diagnosis not present

## 2017-04-04 DIAGNOSIS — G20 Parkinson's disease: Secondary | ICD-10-CM | POA: Diagnosis not present

## 2017-04-04 DIAGNOSIS — R2689 Other abnormalities of gait and mobility: Secondary | ICD-10-CM | POA: Diagnosis not present

## 2017-04-05 ENCOUNTER — Ambulatory Visit (HOSPITAL_COMMUNITY): Payer: Medicare Other

## 2017-04-05 ENCOUNTER — Ambulatory Visit (INDEPENDENT_AMBULATORY_CARE_PROVIDER_SITE_OTHER): Payer: Medicare Other | Admitting: Neurology

## 2017-04-05 ENCOUNTER — Encounter: Payer: Self-pay | Admitting: Neurology

## 2017-04-05 VITALS — BP 128/68 | HR 72

## 2017-04-05 DIAGNOSIS — I2510 Atherosclerotic heart disease of native coronary artery without angina pectoris: Secondary | ICD-10-CM | POA: Diagnosis not present

## 2017-04-05 DIAGNOSIS — R2689 Other abnormalities of gait and mobility: Secondary | ICD-10-CM | POA: Diagnosis not present

## 2017-04-05 DIAGNOSIS — G20 Parkinson's disease: Secondary | ICD-10-CM

## 2017-04-05 DIAGNOSIS — M6281 Muscle weakness (generalized): Secondary | ICD-10-CM | POA: Diagnosis not present

## 2017-04-05 MED ORDER — ENTACAPONE 200 MG PO TABS: 200.0000 mg | ORAL_TABLET | Freq: Three times a day (TID) | ORAL | 1 refills | Status: DC

## 2017-04-05 NOTE — Patient Instructions (Signed)
Add entacapone 200 mg to your daytime dosages of carbidopa/levodopa.  Don't take it with the bedtime.

## 2017-04-06 DIAGNOSIS — G20 Parkinson's disease: Secondary | ICD-10-CM | POA: Diagnosis not present

## 2017-04-06 DIAGNOSIS — R2689 Other abnormalities of gait and mobility: Secondary | ICD-10-CM | POA: Diagnosis not present

## 2017-04-06 DIAGNOSIS — M6281 Muscle weakness (generalized): Secondary | ICD-10-CM | POA: Diagnosis not present

## 2017-04-09 DIAGNOSIS — M6281 Muscle weakness (generalized): Secondary | ICD-10-CM | POA: Diagnosis not present

## 2017-04-09 DIAGNOSIS — G20 Parkinson's disease: Secondary | ICD-10-CM | POA: Diagnosis not present

## 2017-04-09 DIAGNOSIS — R2689 Other abnormalities of gait and mobility: Secondary | ICD-10-CM | POA: Diagnosis not present

## 2017-04-10 ENCOUNTER — Ambulatory Visit (HOSPITAL_COMMUNITY): Payer: Medicare Other

## 2017-04-10 DIAGNOSIS — R2689 Other abnormalities of gait and mobility: Secondary | ICD-10-CM | POA: Diagnosis not present

## 2017-04-10 DIAGNOSIS — M6281 Muscle weakness (generalized): Secondary | ICD-10-CM | POA: Diagnosis not present

## 2017-04-10 DIAGNOSIS — G20 Parkinson's disease: Secondary | ICD-10-CM | POA: Diagnosis not present

## 2017-04-12 ENCOUNTER — Ambulatory Visit (HOSPITAL_COMMUNITY): Payer: Medicare Other

## 2017-04-12 DIAGNOSIS — M6281 Muscle weakness (generalized): Secondary | ICD-10-CM | POA: Diagnosis not present

## 2017-04-12 DIAGNOSIS — R2689 Other abnormalities of gait and mobility: Secondary | ICD-10-CM | POA: Diagnosis not present

## 2017-04-12 DIAGNOSIS — G20 Parkinson's disease: Secondary | ICD-10-CM | POA: Diagnosis not present

## 2017-04-13 DIAGNOSIS — G20 Parkinson's disease: Secondary | ICD-10-CM | POA: Diagnosis not present

## 2017-04-13 DIAGNOSIS — M6281 Muscle weakness (generalized): Secondary | ICD-10-CM | POA: Diagnosis not present

## 2017-04-13 DIAGNOSIS — R2689 Other abnormalities of gait and mobility: Secondary | ICD-10-CM | POA: Diagnosis not present

## 2017-04-16 DIAGNOSIS — G20 Parkinson's disease: Secondary | ICD-10-CM | POA: Diagnosis not present

## 2017-04-16 DIAGNOSIS — R2689 Other abnormalities of gait and mobility: Secondary | ICD-10-CM | POA: Diagnosis not present

## 2017-04-16 DIAGNOSIS — M6281 Muscle weakness (generalized): Secondary | ICD-10-CM | POA: Diagnosis not present

## 2017-04-17 ENCOUNTER — Ambulatory Visit (HOSPITAL_COMMUNITY): Payer: Medicare Other

## 2017-04-18 DIAGNOSIS — M6281 Muscle weakness (generalized): Secondary | ICD-10-CM | POA: Diagnosis not present

## 2017-04-18 DIAGNOSIS — G20 Parkinson's disease: Secondary | ICD-10-CM | POA: Diagnosis not present

## 2017-04-18 DIAGNOSIS — R2689 Other abnormalities of gait and mobility: Secondary | ICD-10-CM | POA: Diagnosis not present

## 2017-04-19 ENCOUNTER — Other Ambulatory Visit: Payer: Self-pay | Admitting: Internal Medicine

## 2017-04-19 ENCOUNTER — Ambulatory Visit (HOSPITAL_COMMUNITY): Payer: Medicare Other

## 2017-04-20 DIAGNOSIS — R2689 Other abnormalities of gait and mobility: Secondary | ICD-10-CM | POA: Diagnosis not present

## 2017-04-20 DIAGNOSIS — M6281 Muscle weakness (generalized): Secondary | ICD-10-CM | POA: Diagnosis not present

## 2017-04-20 DIAGNOSIS — G20 Parkinson's disease: Secondary | ICD-10-CM | POA: Diagnosis not present

## 2017-04-23 DIAGNOSIS — M6281 Muscle weakness (generalized): Secondary | ICD-10-CM | POA: Diagnosis not present

## 2017-04-23 DIAGNOSIS — R2689 Other abnormalities of gait and mobility: Secondary | ICD-10-CM | POA: Diagnosis not present

## 2017-04-23 DIAGNOSIS — G20 Parkinson's disease: Secondary | ICD-10-CM | POA: Diagnosis not present

## 2017-04-24 ENCOUNTER — Ambulatory Visit (HOSPITAL_COMMUNITY): Payer: Medicare Other

## 2017-04-24 DIAGNOSIS — M6281 Muscle weakness (generalized): Secondary | ICD-10-CM | POA: Diagnosis not present

## 2017-04-24 DIAGNOSIS — R2689 Other abnormalities of gait and mobility: Secondary | ICD-10-CM | POA: Diagnosis not present

## 2017-04-24 DIAGNOSIS — G20 Parkinson's disease: Secondary | ICD-10-CM | POA: Diagnosis not present

## 2017-04-26 ENCOUNTER — Ambulatory Visit (HOSPITAL_COMMUNITY): Payer: Medicare Other

## 2017-04-26 DIAGNOSIS — R2689 Other abnormalities of gait and mobility: Secondary | ICD-10-CM | POA: Diagnosis not present

## 2017-04-26 DIAGNOSIS — M6281 Muscle weakness (generalized): Secondary | ICD-10-CM | POA: Diagnosis not present

## 2017-04-26 DIAGNOSIS — G20 Parkinson's disease: Secondary | ICD-10-CM | POA: Diagnosis not present

## 2017-04-30 DIAGNOSIS — N401 Enlarged prostate with lower urinary tract symptoms: Secondary | ICD-10-CM | POA: Diagnosis not present

## 2017-04-30 DIAGNOSIS — R351 Nocturia: Secondary | ICD-10-CM | POA: Diagnosis not present

## 2017-04-30 DIAGNOSIS — N3281 Overactive bladder: Secondary | ICD-10-CM | POA: Diagnosis not present

## 2017-05-01 ENCOUNTER — Ambulatory Visit (HOSPITAL_COMMUNITY): Payer: Medicare Other

## 2017-05-01 DIAGNOSIS — M6281 Muscle weakness (generalized): Secondary | ICD-10-CM | POA: Diagnosis not present

## 2017-05-01 DIAGNOSIS — G20 Parkinson's disease: Secondary | ICD-10-CM | POA: Diagnosis not present

## 2017-05-01 DIAGNOSIS — R2689 Other abnormalities of gait and mobility: Secondary | ICD-10-CM | POA: Diagnosis not present

## 2017-05-03 ENCOUNTER — Ambulatory Visit (HOSPITAL_COMMUNITY): Payer: Medicare Other

## 2017-05-03 ENCOUNTER — Encounter: Payer: Self-pay | Admitting: Neurology

## 2017-05-04 DIAGNOSIS — G20 Parkinson's disease: Secondary | ICD-10-CM | POA: Diagnosis not present

## 2017-05-04 DIAGNOSIS — R2689 Other abnormalities of gait and mobility: Secondary | ICD-10-CM | POA: Diagnosis not present

## 2017-05-04 DIAGNOSIS — M6281 Muscle weakness (generalized): Secondary | ICD-10-CM | POA: Diagnosis not present

## 2017-05-07 DIAGNOSIS — R2689 Other abnormalities of gait and mobility: Secondary | ICD-10-CM | POA: Diagnosis not present

## 2017-05-07 DIAGNOSIS — G20 Parkinson's disease: Secondary | ICD-10-CM | POA: Diagnosis not present

## 2017-05-07 DIAGNOSIS — M6281 Muscle weakness (generalized): Secondary | ICD-10-CM | POA: Diagnosis not present

## 2017-05-08 ENCOUNTER — Ambulatory Visit (HOSPITAL_COMMUNITY): Payer: Medicare Other

## 2017-05-08 DIAGNOSIS — G20 Parkinson's disease: Secondary | ICD-10-CM | POA: Diagnosis not present

## 2017-05-08 DIAGNOSIS — R2689 Other abnormalities of gait and mobility: Secondary | ICD-10-CM | POA: Diagnosis not present

## 2017-05-08 DIAGNOSIS — M6281 Muscle weakness (generalized): Secondary | ICD-10-CM | POA: Diagnosis not present

## 2017-05-10 ENCOUNTER — Ambulatory Visit (HOSPITAL_COMMUNITY): Payer: Medicare Other

## 2017-05-10 DIAGNOSIS — M6281 Muscle weakness (generalized): Secondary | ICD-10-CM | POA: Diagnosis not present

## 2017-05-10 DIAGNOSIS — G20 Parkinson's disease: Secondary | ICD-10-CM | POA: Diagnosis not present

## 2017-05-10 DIAGNOSIS — R2689 Other abnormalities of gait and mobility: Secondary | ICD-10-CM | POA: Diagnosis not present

## 2017-05-14 DIAGNOSIS — R2689 Other abnormalities of gait and mobility: Secondary | ICD-10-CM | POA: Diagnosis not present

## 2017-05-14 DIAGNOSIS — M6281 Muscle weakness (generalized): Secondary | ICD-10-CM | POA: Diagnosis not present

## 2017-05-14 DIAGNOSIS — G20 Parkinson's disease: Secondary | ICD-10-CM | POA: Diagnosis not present

## 2017-05-16 DIAGNOSIS — M6281 Muscle weakness (generalized): Secondary | ICD-10-CM | POA: Diagnosis not present

## 2017-05-16 DIAGNOSIS — G20 Parkinson's disease: Secondary | ICD-10-CM | POA: Diagnosis not present

## 2017-05-16 DIAGNOSIS — R2689 Other abnormalities of gait and mobility: Secondary | ICD-10-CM | POA: Diagnosis not present

## 2017-05-18 ENCOUNTER — Ambulatory Visit (INDEPENDENT_AMBULATORY_CARE_PROVIDER_SITE_OTHER): Payer: Medicare Other | Admitting: Pulmonary Disease

## 2017-05-18 VITALS — BP 114/72 | HR 74 | Ht 70.0 in | Wt 174.0 lb

## 2017-05-18 DIAGNOSIS — J441 Chronic obstructive pulmonary disease with (acute) exacerbation: Secondary | ICD-10-CM | POA: Diagnosis not present

## 2017-05-18 DIAGNOSIS — J9611 Chronic respiratory failure with hypoxia: Secondary | ICD-10-CM

## 2017-05-18 DIAGNOSIS — J8410 Pulmonary fibrosis, unspecified: Secondary | ICD-10-CM

## 2017-05-18 DIAGNOSIS — I2510 Atherosclerotic heart disease of native coronary artery without angina pectoris: Secondary | ICD-10-CM

## 2017-05-18 NOTE — Patient Instructions (Signed)
Chronic respiratory failure with hypoxemia: We will once again order a nonrebreather mask to use at home, if you have not heard from your durable medical equipment company about this by next week call them or [place] to follow-up Keep using 6 L of oxygen at rest, 15 with exertion Use saline gel in your nose to help with the sinus congestion  Centrilobular emphysema: Use Advair and Incruise until you run out of that supplied After you run about at that supply start using steel toe 2 puffs daily no matter how you feel Exercise regularly  Pulmonary fibrosis due to recurrent aspiration: Continue to use caution while swallowing  We will plan on seeing you back in 3 months or sooner if needed

## 2017-05-18 NOTE — Progress Notes (Signed)
Subjective:    Patient ID: Bradley Bennett, male    DOB: Nov 16, 1932, 84 y.o.   MRN: [id number]  Synopsis: On her fibrosis and possible COPD with a history of chronic aspiration in the setting of Parkinson's disease. Uses 3-4 L of oxygen continuously.  He was started on oxygen around 2013 after his diagnosis of COPD in 2013.  He was diagnosed with an ILD around 72 in [place]. A modified barium swallow in 2015 showed aspiration.  He smoked 35 years, 2 ppd, quit around 1992.   HPI Chief Complaint  Patient presents with  . Follow-up    c/o sinus congestion, sob with exertion.     From a breathing standpoint Bradley Bennett reports no significant changes.  He has been having some sinus congestion lately.  He has not switched over to using an oxygen mask yet.  He still using Advair and Incruise right now but he is planning to switch to steel toe when he runs out of his Advair stores.  He is still using 6 L of oxygen at rest and 15 with exertion.  He has a lot of sinus congestion which bothers him.  He has been having more trouble swallowing lately because of the Parkinson's.  He is drinking less water because of this.  He has been having his medications changed from the Parkinson's.  He had a lot of diarrhea and weight loss which they worry may be have been related to the Parkinson's.  Past Medical History:  Diagnosis Date  . Arthritis   . Asthma   . Coronary artery disease    ASCAD with MI in 1992 and subsequently underwent 4 vessel CABG in [place].  Since then he has had several PCIs with the last one being 2015.    . Emphysema of lung ([place])   . Hearing difficulty of both ears   . Hyperlipidemia   . Hypertension   . ILD (interstitial lung disease) ([place])   . Parkinson disease ([place])          Review of Systems  Constitutional: Positive for fatigue. Negative for chills and fever.  HENT: Positive for postnasal drip, rhinorrhea and sinus pressure. Negative for sinus pain.     Respiratory: Positive for cough and shortness of breath. Negative for wheezing.   Cardiovascular: Negative for chest pain, palpitations and leg swelling.  Gastrointestinal: Negative for abdominal distention, constipation, diarrhea and nausea.  Musculoskeletal: Negative for arthralgias, gait problem, joint swelling and neck stiffness.  Neurological: Negative for seizures, speech difficulty, light-headedness, numbness and headaches.       Objective:   Physical Exam Vitals:   05/18/17 1636  BP: 114/72  Pulse: 74  SpO2: 94%  Weight: 174 lb (78.9 kg)  Height: 5\' 10"  (1.778 m)   6 L continuous  Gen: chronically ill appearing HENT: OP clear, TM's clear, neck supple PULM: Few crackles bases B, normal percussion CV: RRR, no mgr, trace edema GI: BS+, soft, nontender Derm: no cyanosis or rash Psyche: normal mood and affect    BMET    Component Value Date/Time   NA 139 12/30/2016 0519   K 4.0 12/30/2016 0519   CL 106 12/30/2016 0519   CO2 27 12/30/2016 0519   GLUCOSE 133 (H) 12/30/2016 0519   BUN 18 12/30/2016 0519   CREATININE 0.57 (L) 12/30/2016 0519   CALCIUM 8.6 (L) 12/30/2016 0519   GFRNONAA >60 12/30/2016 0519   GFRAA >60 12/30/2016 0519     Records from his visit with  neurology reviewed where he had his Parkinson's medications adjusted.    Assessment & Plan:  Pulmonary fibrosis ([place]) - Plan: Ambulatory Referral for DME  Chronic respiratory failure with hypoxia ([place]) - Plan: Ambulatory Referral for DME  Discussion: Once again this is been a relatively stable interval for Bradley Bennett despite his very severe lung disease.  I would like to see him use a nonrebreather mask rather than the high flow oxygen cannula at home because this might allow him to use humidified air.  Plan: Chronic respiratory failure with hypoxemia: We will once again order a nonrebreather mask to use at home, if you have not heard from your durable medical equipment company about this by next  week call them or [place] to follow-up Keep using 6 L of oxygen at rest, 15 with exertion Use saline gel in your nose to help with the sinus congestion  Centrilobular emphysema: Use Advair and Incruise until you run out of that supplied After you run about at that supply start using steel toe 2 puffs daily no matter how you feel Exercise regularly  Pulmonary fibrosis due to recurrent aspiration: Continue to use caution while swallowing  We will plan on seeing you back in 3 months or sooner if needed   Current Outpatient Medications:  .  acetaminophen (TYLENOL) 500 MG tablet, Take 500-1,000 mg by mouth every 6 (six) hours as needed for mild pain, moderate pain, fever or headache. , Disp: , Rfl:  .  ADVAIR DISKUS 500-50 MCG/DOSE AEPB, INHALE ONE PUFF BY MOUTH INTO THE LUNGS TWO TIMES A DAY, Disp: 180 each, Rfl: 1 .  alfuzosin (UROXATRAL) 10 MG 24 hr tablet, Take 1 tablet (10 mg total) by mouth daily., Disp: 90 tablet, Rfl: 1 .  aspirin EC 81 MG tablet, Take 1 tablet (81 mg total) by mouth daily., Disp: 90 tablet, Rfl: 3 .  atorvastatin (LIPITOR) 20 MG tablet, Take 1 tablet (20 mg total) by mouth daily at 6 PM., Disp: 90 tablet, Rfl: 3 .  busPIRone (BUSPAR) 5 MG tablet, TAKE ONE TABLET BY MOUTH TWICE A DAY, Disp: 180 tablet, Rfl: 1 .  carbidopa-levodopa (SINEMET CR) 50-200 MG tablet, Take 1 tablet by mouth at bedtime., Disp: 90 tablet, Rfl: 1 .  carbidopa-levodopa (SINEMET IR) 25-100 MG tablet, Take 2 tablets by mouth 4 (four) times daily., Disp: 720 tablet, Rfl: 1 .  clobetasol ointment (TEMOVATE) 3.81 %, Apply 1 application topically 2 (two) times daily., Disp: 30 g, Rfl: 1 .  entacapone (COMTAN) 200 MG tablet, Take 1 tablet (200 mg total) by mouth 3 (three) times daily., Disp: 270 tablet, Rfl: 1 .  finasteride (PROSCAR) 5 MG tablet, Take 1 tablet (5 mg total) by mouth every evening., Disp: , Rfl:  .  fluticasone (FLONASE) 50 MCG/ACT nasal spray, Place 1 spray into both nostrils daily., Disp: 48  g, Rfl: 1 .  gabapentin (NEURONTIN) 100 MG capsule, TAKE ONE CAPSULE BY MOUTH AT 8AM AND ONE CAPSULE AT 4PM, Disp: 180 capsule, Rfl: 1 .  gabapentin (NEURONTIN) 400 MG capsule, TAKE ONE CAPSULE BY MOUTH AT BEDTIME, Disp: 90 capsule, Rfl: 1 .  INCRUSE ELLIPTA 62.5 MCG/INH AEPB, INHALE ONE PUFF INTO THE LUNGS DAILY, Disp: 90 each, Rfl: 1 .  lactose free nutrition (BOOST PLUS) LIQD, Take 237 mLs by mouth 3 (three) times daily with meals., Disp: 30 Can, Rfl: 0 .  levothyroxine (SYNTHROID, LEVOTHROID) 150 MCG tablet, TAKE ONE TABLET BY MOUTH DAILY, Disp: 90 tablet, Rfl: 1 .  mirabegron  ER (MYRBETRIQ) 50 MG TB24 tablet, Take 50 mg by mouth every evening., Disp: , Rfl:  .  Multiple Vitamin (MULTIVITAMIN WITH MINERALS) TABS tablet, Take 1 tablet by mouth daily at 12 noon., Disp: , Rfl:  .  Multiple Vitamins-Minerals (ICAPS AREDS 2 PO), Take 1-2 tablets by mouth See admin instructions. 2 tablets @ noon and 1 tablet @dinner , Disp: , Rfl:  .  mupirocin ointment (BACTROBAN) 2 %, Apply to affected area twice daily, Disp: 22 g, Rfl: 2 .  sodium chloride (OCEAN) 0.65 % SOLN nasal spray, Place 1 spray into both nostrils as needed for congestion., Disp: 1 Bottle, Rfl: 2 .  Tiotropium Bromide-Olodaterol (STIOLTO RESPIMAT) 2.5-2.5 MCG/ACT AERS, Inhale 2 puffs into the lungs daily., Disp: 1 Inhaler, Rfl: 0 .  Tiotropium Bromide-Olodaterol (STIOLTO RESPIMAT) 2.5-2.5 MCG/ACT AERS, Inhale 2 puffs into the lungs daily., Disp: 3 Inhaler, Rfl: 1 .  traZODone (DESYREL) 100 MG tablet, TAKE ONE TABLET BY MOUTH AT BEDTIME, Disp: 90 tablet, Rfl: 0 .  trimethoprim (TRIMPEX) 100 MG tablet, TAKE 1 TABLET BY MOUTH EVERYDAY AT BEDTIME, Disp: , Rfl: 11

## 2017-05-23 DIAGNOSIS — R2689 Other abnormalities of gait and mobility: Secondary | ICD-10-CM | POA: Diagnosis not present

## 2017-05-23 DIAGNOSIS — G20 Parkinson's disease: Secondary | ICD-10-CM | POA: Diagnosis not present

## 2017-05-23 DIAGNOSIS — M6281 Muscle weakness (generalized): Secondary | ICD-10-CM | POA: Diagnosis not present

## 2017-05-24 ENCOUNTER — Ambulatory Visit (INDEPENDENT_AMBULATORY_CARE_PROVIDER_SITE_OTHER): Payer: Medicare Other | Admitting: Otolaryngology

## 2017-05-24 DIAGNOSIS — H6123 Impacted cerumen, bilateral: Secondary | ICD-10-CM

## 2017-05-24 DIAGNOSIS — H903 Sensorineural hearing loss, bilateral: Secondary | ICD-10-CM

## 2017-05-25 DIAGNOSIS — R2689 Other abnormalities of gait and mobility: Secondary | ICD-10-CM | POA: Diagnosis not present

## 2017-05-25 DIAGNOSIS — M6281 Muscle weakness (generalized): Secondary | ICD-10-CM | POA: Diagnosis not present

## 2017-05-25 DIAGNOSIS — G20 Parkinson's disease: Secondary | ICD-10-CM | POA: Diagnosis not present

## 2017-05-28 DIAGNOSIS — G20 Parkinson's disease: Secondary | ICD-10-CM | POA: Diagnosis not present

## 2017-05-28 DIAGNOSIS — M6281 Muscle weakness (generalized): Secondary | ICD-10-CM | POA: Diagnosis not present

## 2017-05-28 DIAGNOSIS — R2689 Other abnormalities of gait and mobility: Secondary | ICD-10-CM | POA: Diagnosis not present

## 2017-05-30 DIAGNOSIS — G20 Parkinson's disease: Secondary | ICD-10-CM | POA: Diagnosis not present

## 2017-05-30 DIAGNOSIS — M6281 Muscle weakness (generalized): Secondary | ICD-10-CM | POA: Diagnosis not present

## 2017-05-30 DIAGNOSIS — R2689 Other abnormalities of gait and mobility: Secondary | ICD-10-CM | POA: Diagnosis not present

## 2017-05-31 DIAGNOSIS — G20 Parkinson's disease: Secondary | ICD-10-CM | POA: Diagnosis not present

## 2017-05-31 DIAGNOSIS — R2689 Other abnormalities of gait and mobility: Secondary | ICD-10-CM | POA: Diagnosis not present

## 2017-05-31 DIAGNOSIS — M6281 Muscle weakness (generalized): Secondary | ICD-10-CM | POA: Diagnosis not present

## 2017-06-04 ENCOUNTER — Encounter: Payer: Self-pay | Admitting: Neurology

## 2017-06-04 DIAGNOSIS — M6281 Muscle weakness (generalized): Secondary | ICD-10-CM | POA: Diagnosis not present

## 2017-06-04 DIAGNOSIS — G20 Parkinson's disease: Secondary | ICD-10-CM | POA: Diagnosis not present

## 2017-06-04 DIAGNOSIS — R2689 Other abnormalities of gait and mobility: Secondary | ICD-10-CM | POA: Diagnosis not present

## 2017-06-06 ENCOUNTER — Telehealth: Payer: Self-pay | Admitting: Emergency Medicine

## 2017-06-06 DIAGNOSIS — G20 Parkinson's disease: Secondary | ICD-10-CM | POA: Diagnosis not present

## 2017-06-06 DIAGNOSIS — R2689 Other abnormalities of gait and mobility: Secondary | ICD-10-CM | POA: Diagnosis not present

## 2017-06-06 NOTE — Telephone Encounter (Signed)
Called pt to schedule AWV. Pt declined at this time.

## 2017-06-14 ENCOUNTER — Other Ambulatory Visit: Payer: Self-pay | Admitting: Internal Medicine

## 2017-06-14 ENCOUNTER — Other Ambulatory Visit: Payer: Self-pay | Admitting: Pulmonary Disease

## 2017-06-14 DIAGNOSIS — R2689 Other abnormalities of gait and mobility: Secondary | ICD-10-CM | POA: Diagnosis not present

## 2017-06-14 DIAGNOSIS — G20 Parkinson's disease: Secondary | ICD-10-CM | POA: Diagnosis not present

## 2017-06-18 DIAGNOSIS — G20 Parkinson's disease: Secondary | ICD-10-CM | POA: Diagnosis not present

## 2017-06-18 DIAGNOSIS — R2689 Other abnormalities of gait and mobility: Secondary | ICD-10-CM | POA: Diagnosis not present

## 2017-06-22 DIAGNOSIS — G20 Parkinson's disease: Secondary | ICD-10-CM | POA: Diagnosis not present

## 2017-06-22 DIAGNOSIS — R2689 Other abnormalities of gait and mobility: Secondary | ICD-10-CM | POA: Diagnosis not present

## 2017-06-26 DIAGNOSIS — R2689 Other abnormalities of gait and mobility: Secondary | ICD-10-CM | POA: Diagnosis not present

## 2017-06-26 DIAGNOSIS — G20 Parkinson's disease: Secondary | ICD-10-CM | POA: Diagnosis not present

## 2017-06-27 DIAGNOSIS — R2689 Other abnormalities of gait and mobility: Secondary | ICD-10-CM | POA: Diagnosis not present

## 2017-06-27 DIAGNOSIS — G20 Parkinson's disease: Secondary | ICD-10-CM | POA: Diagnosis not present

## 2017-07-04 DIAGNOSIS — R2689 Other abnormalities of gait and mobility: Secondary | ICD-10-CM | POA: Diagnosis not present

## 2017-07-04 DIAGNOSIS — G20 Parkinson's disease: Secondary | ICD-10-CM | POA: Diagnosis not present

## 2017-07-05 DIAGNOSIS — G20 Parkinson's disease: Secondary | ICD-10-CM | POA: Diagnosis not present

## 2017-07-05 DIAGNOSIS — R2689 Other abnormalities of gait and mobility: Secondary | ICD-10-CM | POA: Diagnosis not present

## 2017-07-06 DIAGNOSIS — R2689 Other abnormalities of gait and mobility: Secondary | ICD-10-CM | POA: Diagnosis not present

## 2017-07-06 DIAGNOSIS — G20 Parkinson's disease: Secondary | ICD-10-CM | POA: Diagnosis not present

## 2017-07-09 DIAGNOSIS — G20 Parkinson's disease: Secondary | ICD-10-CM | POA: Diagnosis not present

## 2017-07-09 DIAGNOSIS — R2689 Other abnormalities of gait and mobility: Secondary | ICD-10-CM | POA: Diagnosis not present

## 2017-07-11 DIAGNOSIS — R2689 Other abnormalities of gait and mobility: Secondary | ICD-10-CM | POA: Diagnosis not present

## 2017-07-11 DIAGNOSIS — G20 Parkinson's disease: Secondary | ICD-10-CM | POA: Diagnosis not present

## 2017-07-12 DIAGNOSIS — R2689 Other abnormalities of gait and mobility: Secondary | ICD-10-CM | POA: Diagnosis not present

## 2017-07-12 DIAGNOSIS — G20 Parkinson's disease: Secondary | ICD-10-CM | POA: Diagnosis not present

## 2017-07-16 DIAGNOSIS — R2689 Other abnormalities of gait and mobility: Secondary | ICD-10-CM | POA: Diagnosis not present

## 2017-07-16 DIAGNOSIS — G20 Parkinson's disease: Secondary | ICD-10-CM | POA: Diagnosis not present

## 2017-07-17 DIAGNOSIS — R2689 Other abnormalities of gait and mobility: Secondary | ICD-10-CM | POA: Diagnosis not present

## 2017-07-17 DIAGNOSIS — G20 Parkinson's disease: Secondary | ICD-10-CM | POA: Diagnosis not present

## 2017-07-20 ENCOUNTER — Telehealth: Payer: Self-pay | Admitting: Licensed Clinical Social Worker

## 2017-07-20 DIAGNOSIS — R2689 Other abnormalities of gait and mobility: Secondary | ICD-10-CM | POA: Diagnosis not present

## 2017-07-20 DIAGNOSIS — G20 Parkinson's disease: Secondary | ICD-10-CM | POA: Diagnosis not present

## 2017-07-20 NOTE — Telephone Encounter (Signed)
Palliative Care SW phoned patient's daughter, Margarita Grizzle, to set up a home visit with patient.  She stated he sees his doctor's regularly and did not feel he would benefit from additional visits.  She wishes for him to remain a Palliative Care patient, however.

## 2017-07-23 DIAGNOSIS — G20 Parkinson's disease: Secondary | ICD-10-CM | POA: Diagnosis not present

## 2017-07-23 DIAGNOSIS — R2689 Other abnormalities of gait and mobility: Secondary | ICD-10-CM | POA: Diagnosis not present

## 2017-07-24 DIAGNOSIS — G20 Parkinson's disease: Secondary | ICD-10-CM | POA: Diagnosis not present

## 2017-07-24 DIAGNOSIS — R2689 Other abnormalities of gait and mobility: Secondary | ICD-10-CM | POA: Diagnosis not present

## 2017-07-25 DIAGNOSIS — G20 Parkinson's disease: Secondary | ICD-10-CM | POA: Diagnosis not present

## 2017-07-25 DIAGNOSIS — R2689 Other abnormalities of gait and mobility: Secondary | ICD-10-CM | POA: Diagnosis not present

## 2017-07-28 ENCOUNTER — Other Ambulatory Visit: Payer: Self-pay | Admitting: Neurology

## 2017-07-30 DIAGNOSIS — R2689 Other abnormalities of gait and mobility: Secondary | ICD-10-CM | POA: Diagnosis not present

## 2017-07-30 DIAGNOSIS — G20 Parkinson's disease: Secondary | ICD-10-CM | POA: Diagnosis not present

## 2017-07-31 DIAGNOSIS — R2689 Other abnormalities of gait and mobility: Secondary | ICD-10-CM | POA: Diagnosis not present

## 2017-07-31 DIAGNOSIS — G20 Parkinson's disease: Secondary | ICD-10-CM | POA: Diagnosis not present

## 2017-08-01 DIAGNOSIS — R2689 Other abnormalities of gait and mobility: Secondary | ICD-10-CM | POA: Diagnosis not present

## 2017-08-01 DIAGNOSIS — G20 Parkinson's disease: Secondary | ICD-10-CM | POA: Diagnosis not present

## 2017-08-02 DIAGNOSIS — R2689 Other abnormalities of gait and mobility: Secondary | ICD-10-CM | POA: Diagnosis not present

## 2017-08-02 DIAGNOSIS — G20 Parkinson's disease: Secondary | ICD-10-CM | POA: Diagnosis not present

## 2017-08-06 DIAGNOSIS — G20 Parkinson's disease: Secondary | ICD-10-CM | POA: Diagnosis not present

## 2017-08-06 DIAGNOSIS — M6281 Muscle weakness (generalized): Secondary | ICD-10-CM | POA: Diagnosis not present

## 2017-08-06 DIAGNOSIS — N393 Stress incontinence (female) (male): Secondary | ICD-10-CM | POA: Diagnosis not present

## 2017-08-06 DIAGNOSIS — R2689 Other abnormalities of gait and mobility: Secondary | ICD-10-CM | POA: Diagnosis not present

## 2017-08-06 NOTE — Progress Notes (Signed)
Bradley Bennett was seen today in the movement disorders clinic for neurologic consultation at the request of Burns, [name], MD.  The consultation is for the evaluation of PD.  This patient is accompanied in the office by his child who supplements the history.  Pt just moved here from [place].  I don't have any previous neurology records.  Pt reports that he was dx with PD about 4 years ago per pt and about 10 years ago per daughter.  His first sx was L hand tremor.  Daughter states he was on something else prior to the levodopa but she isn't sure that it was specifically for PD.  He is currently on carbidopa/levodopa 25/100, 1.5 tablets tid (8:30am/5:30/9pm).  Daughter thinks that the med wears off   11/16/15 update: The patient follows up today, accompanied by his daughter who supplements the history.  Last visit, we talked about changing his dosing of his medication, so that he takes carbidopa/levodopa 25/100, 2 tablets at 8 AM/noon/4 PM and then we added carbidopa/levodopa 50/200 at bedtime.  Daughter thinks that it helps.  He had an MRI of the brain and cervical spine since our last visit.  The MRI of the brain demonstrated mild to moderate white matter disease.  The MRI of the cervical spine demonstrated degenerative changes and spinal stenosis, but nothing surgical noted.  He has not had any falls since last visit.  No hallucinations.  No lightheadedness or near syncope.  He is using melatonin, 10 mg, for sleep and trazodone for sleep.  Doing pulm rehab on tues/thurs but daughter states that more active on the other days.  He is doing Wii bowling.  No hallucinations.  He lives in independent assisted living but he does his own medication.  His daughter prepares the pill box and he has no trouble remembering to take the medication.    03/24/16 update:  Patient follows up today, accompanied by his daughter supplements the history.  He is on carbidopa/levodopa 25/100, 2 tablet at 8 AM/noon/4 PM and  carbidopa/levodopa 50/200 at bedtime.  Noting some tremor in the leg.  Daughter fills his pill box and noting a lot of extra pills at the end of the week.  Patient admits that he forgot the middle of the day dose today (supposed to take at noon and it is 3pm).  He has had no falls.  No hallucinations.  No lightheadedness or near syncope.  Not able to exercise because of pulmonary constraints.  In pulmonary rehab and reviewed those records.  He remains in independent assisted living. Daughter quietly mentions to me that Dr. [place] Bells mentioned hospice.  Noting paresthesias of the hands and feet.    08/23/16 update: Patient seen today in follow-up, accompanied by his daughter who supplements the history.  Patient is on carbidopa/levodopa 25/100, 2 tablets at 8 AM/noon/4 PM and carbidopa/levodopa 50/200 at bed. Has been remembering to take meds.  Has been experiencing low blood pressure and saw PCP on 08/21/16 for this.   The records that were made available to me were reviewed.  Has been attending pulm rehab.  His O2 goes down to 70% there.  His daughter is trying to get him to accept hospice.  He is not ready to do that yet.  12/04/16 update: Patient is seen today in follow-up, accompanied by his daughter who supplements the history.  The patient is on carbidopa/levodopa 25/100, 2 tablets at 8 AM/noon/4 p.m.  He is on carbidopa/levodopa 50/200 at bedtime.  Pt denies falls.  Pt denies lightheadedness, near syncope.  No hallucinations.  Mood has been good.  In sept, he went into pulm arrest and required bag valve mask ventilation and then came back with sepsis.  The records that were made available to me were reviewed.  He has had more tremor.  Daughter states that not taking more lung meds.  He has not tried more levodopa.  His memory ha suffered.  He is living in independent assisted living but daughter is there all the time.  Daughter has applied for VA benefits to bring in some help for management of meds/O2.   He  lives at [facility].  Just finished PT and that helped.  He now has a scooter as he has the farthest apartment from the dining hall and he uses all the o2 just getting to the dining room.  No hallucinations.  Not choking on foods but slow to swallow and therefore taking in less calories.  04/05/17 update: The patient is seen today in follow-up, accompanied by his daughter who supplements the history.  Patient is on carbidopa/levodopa 25/100, 2 tablets 4 times per day (an increase) and carbidopa/levodopa 50/200 at bedtime. He reports that he is still only taking carbidopa/levodopa 25/100, 2 po tid (7am/11am/3pm) and carbidopa/levodopa 50/200 at bed because he goes to bed so early (8:30).  Daughter notes more weak and tremulous.   Records have been reviewed since last visit.  He has seen orthopedics regarding cellulitis on his toe which is now resolved.  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  He saw cardiology in January and he was instructed to start baby aspirin due to the fact that he stopped Plavix due to significant bleeding.  08/07/17 update: Patient is seen today in follow-up for Parkinson's disease.  Patient is accompanied by his daughter who supplements history.  Patient is on carbidopa/levodopa 25/100, 2 tablets at 7 AM/11 AM/3 PM and carbidopa/levodopa 50/200 at bedtime.  Daughter states that pt forgets to take med (pt denies) and then becomes tremulous.  They do have an alarmed pillbox at home that will even text his daughter if he does not take it, but he ignores it and they quit using it but his daughter thinks that he they will need to go back to using that.  He is currently using a traditional pillbox that he is just not using properly and is taking pills from the wrong day.  Last visit, entacapone was added to the daytime dosages of levodopa.  His daughter emailed me and stated that she thought it helped tremor, but the  patient thought that it made him more confused, anxious and tired.  They were giving it to him 3 times a day instead of 4 times per day.  They decided to try it twice per day.  It turns out, the medication was causing diarrhea and ultimately needed to be stopped.  He is working with [facility] home health for PT/OT.  He thinks that has been very helpful.  PREVIOUS MEDICATIONS: Sinemet; entacapone (diarrhea)  ALLERGIES:   Allergies  Allergen Reactions  . Isosorbide Other (See Comments)    Reaction:  Headaches     CURRENT MEDICATIONS:  Outpatient Encounter Medications as of 08/07/2017  Medication Sig  . acetaminophen (TYLENOL) 500 MG tablet Take 500-1,000 mg by mouth every 6 (six) hours as needed for mild pain, moderate pain, fever or headache.   . ADVAIR DISKUS 500-50 MCG/DOSE AEPB  INHALE ONE PUFF BY MOUTH INTO THE LUNGS TWO TIMES A DAY  . alfuzosin (UROXATRAL) 10 MG 24 hr tablet Take 1 tablet (10 mg total) by mouth daily.  Marland Kitchen aspirin EC 81 MG tablet Take 1 tablet (81 mg total) by mouth daily.  Marland Kitchen atorvastatin (LIPITOR) 20 MG tablet Take 1 tablet (20 mg total) by mouth daily at 6 PM.  . busPIRone (BUSPAR) 5 MG tablet TAKE ONE TABLET BY MOUTH TWICE A DAY  . busPIRone (BUSPAR) 5 MG tablet TAKE ONE TABLET BY MOUTH DAILY  . carbidopa-levodopa (SINEMET CR) 50-200 MG tablet TAKE ONE TABLET BY MOUTH AT BEDTIME  . carbidopa-levodopa (SINEMET IR) 25-100 MG tablet Take 2 tablets by mouth 4 (four) times daily.  . clobetasol ointment (TEMOVATE) 8.29 % Apply 1 application topically 2 (two) times daily.  . finasteride (PROSCAR) 5 MG tablet Take 1 tablet (5 mg total) by mouth every evening.  . fluticasone (FLONASE) 50 MCG/ACT nasal spray Place 1 spray into both nostrils daily.  Marland Kitchen gabapentin (NEURONTIN) 100 MG capsule TAKE ONE CAPSULE BY MOUTH AT 8AM AND ONE CAPSULE AT 4PM  . gabapentin (NEURONTIN) 400 MG capsule TAKE ONE CAPSULE BY MOUTH AT BEDTIME  . INCRUSE ELLIPTA 62.5 MCG/INH AEPB INHALE ONE PUFF INTO THE  LUNGS DAILY  . lactose free nutrition (BOOST PLUS) LIQD Take 237 mLs by mouth 3 (three) times daily with meals.  Marland Kitchen levothyroxine (SYNTHROID, LEVOTHROID) 150 MCG tablet TAKE ONE TABLET BY MOUTH DAILY  . mirabegron ER (MYRBETRIQ) 50 MG TB24 tablet Take 50 mg by mouth every evening.  . Multiple Vitamin (MULTIVITAMIN WITH MINERALS) TABS tablet Take 1 tablet by mouth daily at 12 noon.  . Multiple Vitamins-Minerals (ICAPS AREDS 2 PO) Take 1-2 tablets by mouth See admin instructions. 2 tablets @ noon and 1 tablet @dinner   . mupirocin ointment (BACTROBAN) 2 % Apply to affected area twice daily  . sodium chloride (OCEAN) 0.65 % SOLN nasal spray Place 1 spray into both nostrils as needed for congestion.  . Tiotropium Bromide-Olodaterol (STIOLTO RESPIMAT) 2.5-2.5 MCG/ACT AERS Inhale 2 puffs into the lungs daily.  . Tiotropium Bromide-Olodaterol (STIOLTO RESPIMAT) 2.5-2.5 MCG/ACT AERS Inhale 2 puffs into the lungs daily.  . traZODone (DESYREL) 100 MG tablet TAKE ONE TABLET BY MOUTH AT BEDTIME  . trimethoprim (TRIMPEX) 100 MG tablet TAKE 1 TABLET BY MOUTH EVERYDAY AT BEDTIME  . [DISCONTINUED] entacapone (COMTAN) 200 MG tablet Take 1 tablet (200 mg total) by mouth 3 (three) times daily.   No facility-administered encounter medications on file as of 08/07/2017.     PAST MEDICAL HISTORY:   Past Medical History:  Diagnosis Date  . Arthritis   . Asthma   . Coronary artery disease    ASCAD with MI in 1992 and subsequently underwent 4 vessel CABG in [place].  Since then he has had several PCIs with the last one being 2015.    . Emphysema of lung ([place])   . Hearing difficulty of both ears   . Hyperlipidemia   . Hypertension   . ILD (interstitial lung disease) ([place])   . Parkinson disease ([place])     PAST SURGICAL HISTORY:   Past Surgical History:  Procedure Laterality Date  . CARPAL TUNNEL RELEASE Right 03/2014  . CORONARY ANGIOPLASTY WITH STENT PLACEMENT    . CORONARY ARTERY BYPASS GRAFT  1992  . HERNIA  REPAIR  10/2013   x3   . VEIN BYPASS SURGERY      SOCIAL HISTORY:   Social History  Socioeconomic History  . Marital status: Divorced    Spouse name: Not on file  . Number of children: Not on file  . Years of education: Not on file  . Highest education level: Not on file  Occupational History  . Occupation: retired    Comment: [employer]  . Financial resource strain: Not on file  . Food insecurity:    Worry: Not on file    Inability: Not on file  . Transportation needs:    Medical: Not on file    Non-medical: Not on file  Tobacco Use  . Smoking status: Former Smoker    Packs/day: 2.00    Years: 35.00    Pack years: 70.00    Last attempt to quit: 08/15/1985    Years since quitting: 32.0  . Smokeless tobacco: Never Used  . Tobacco comment: quit smoking in 1992  Substance and Sexual Activity  . Alcohol use: Yes    Alcohol/week: 0.0 oz    Comment: twice every 6 months  . Drug use: No  . Sexual activity: Never  Lifestyle  . Physical activity:    Days per week: Not on file    Minutes per session: Not on file  . Stress: Not on file  Relationships  . Social connections:    Talks on phone: Not on file    Gets together: Not on file    Attends religious service: Not on file    Active member of club or organization: Not on file    Attends meetings of clubs or organizations: Not on file    Relationship status: Not on file  . Intimate partner violence:    Fear of current or ex partner: Not on file    Emotionally abused: Not on file    Physically abused: Not on file    Forced sexual activity: Not on file  Other Topics Concern  . Not on file  Social History Narrative  . Not on file    FAMILY HISTORY:   Family Status  Relation Name Status  . Father  Deceased       stroke, colon cancer  . Mother  Deceased       arthritis, ruptured spleen  . Brother  Deceased       MI, DM, kidney cancer  . Sister  Alive       lupus  . Sister  Alive       heart disease,  DM  . Daughter  Alive       2, healthy  . Son  Alive       1, healthy  . Sister  (Not Specified)  . Brother  (Not Specified)    ROS: States that lung function has been stable.  Denies any chest pain.  Denies further diarrhea.  PHYSICAL EXAMINATION:    VITALS:   Vitals:   08/07/17 1547  BP: 110/68  Pulse: 97  SpO2: 93%  Weight: 174 lb 4 oz (79 kg)  Height: 5\' 10"  (1.778 m)    Wt Readings from Last 3 Encounters:  08/07/17 174 lb 4 oz (79 kg)  05/18/17 174 lb (78.9 kg)  03/06/17 180 lb 12.8 oz (82 kg)     GEN:  The patient appears stated age and is in NAD. HEENT:  Normocephalic, atraumatic.  The mucous membranes are moist. The superficial temporal arteries are without ropiness or tenderness. CV:  RRR Lungs:  CTAB.  He is wearing O2 Neck/HEME:  There are no carotid  bruits bilaterally.  Neurological examination:  Orientation: He is alert and oriented x3. Montreal Cognitive Assessment  08/16/2015  Visuospatial/ Executive (0/5) 5  Naming (0/3) 3  Attention: Read list of digits (0/2) 2  Attention: Read list of letters (0/1) 1  Attention: Serial 7 subtraction starting at 100 (0/3) 3  Language: Repeat phrase (0/2) 2  Language : Fluency (0/1) 1  Abstraction (0/2) 2  Delayed Recall (0/5) 2  Orientation (0/6) 6  Total 27  Adjusted Score (based on education) 27    Cranial nerves: There is good facial symmetry. The speech is fluent and clear. Soft palate rises symmetrically and there is no tongue deviation. Hearing is markedly decreased to conversational tone. Sensation: Sensation is intact to light touch throughout Motor: Strength is at least antigravity x4  Movement examination: Tone: There is normal tone in the upper and lower extremities. Abnormal movements: Mild tremor noted in the upper extremities today. Coordination:  There is no significant decremation with RAM's, with hand opening and closing, finger taps or toe taps today. Gait and Station: The patient was in  a wheelchair.  I did not walk him today, as he really does not walk at home any longer, primarily because of shortness of breath with ambulation (on oxygen).  He uses a scooter at home.   ASSESSMENT/PLAN:  1.  idiopathic Parkinson's disease.  The patient has tremor, bradykinesia, rigidity and Minimal postural instability.  -carbidopa/levodopa 25/100, so that he takes 2 tablets at 7 AM/11 AM/3 PM.  He is able to take 2 extra tablets as needed throughout the day.  Most of the visit was spent discussing methods to help with compliance.  He really needs to go back to his alarmed pillbox.  -He will continue carbidopa/levodopa 50/200 at bedtime.  -He really cannot exercise because of pulmonary disease, but he doesn't wish to pursue hospice yet.  I think that most of his limitations are related to pulm disease and not PD.    - He is trying to use resources through the [facility].  2.  Hyperreflexia  -MRI of the brain just demonstrated mild to moderate white matter disease and MRI the cervical spine demonstrates degenerative changes, but no cervical surgical lesions.  3.  Dysphagia with aspiration  -Per pulm records, pt does have a hx of dysphagia with chronic aspiration/post inflammatory pulm fibrosis but refuses thickening agents.  His last swallow study per the patient was over a year ago, but if he is unwilling to follow recommendations, there is no use repeating it.  Overall, the patient does not feel that he is aspirating or has significant dysphagia any longer, but he does think it is taking him a long time to eat and thinks that he has trouble getting in calories because of this.  We talked about trying to use Ensure or boost so long as he keeps it away from timing of levodopa.  -pt is DNR  4.  Orthostasis  -improved.  Now off of flomax  5.  Hypoxia secondary to lung disease  -He is using oxygen and now has a scooter to get around.  6.   Follow-up in 6 months.

## 2017-08-07 ENCOUNTER — Encounter: Payer: Self-pay | Admitting: Neurology

## 2017-08-07 ENCOUNTER — Ambulatory Visit (INDEPENDENT_AMBULATORY_CARE_PROVIDER_SITE_OTHER): Payer: Medicare Other | Admitting: Neurology

## 2017-08-07 VITALS — BP 110/68 | HR 97 | Ht 70.0 in | Wt 174.2 lb

## 2017-08-07 DIAGNOSIS — M6281 Muscle weakness (generalized): Secondary | ICD-10-CM | POA: Diagnosis not present

## 2017-08-07 DIAGNOSIS — N393 Stress incontinence (female) (male): Secondary | ICD-10-CM | POA: Diagnosis not present

## 2017-08-07 DIAGNOSIS — I2510 Atherosclerotic heart disease of native coronary artery without angina pectoris: Secondary | ICD-10-CM | POA: Diagnosis not present

## 2017-08-07 DIAGNOSIS — G20 Parkinson's disease: Secondary | ICD-10-CM

## 2017-08-07 DIAGNOSIS — R2689 Other abnormalities of gait and mobility: Secondary | ICD-10-CM | POA: Diagnosis not present

## 2017-08-07 NOTE — Patient Instructions (Signed)
Registration is OPEN!    Third Annual Parkinson's Education Symposium   To register: [email or web address]      Search:  [employer] person attending individually Questions: [place], [place] or [email or web address]

## 2017-08-08 DIAGNOSIS — M6281 Muscle weakness (generalized): Secondary | ICD-10-CM | POA: Diagnosis not present

## 2017-08-08 DIAGNOSIS — N393 Stress incontinence (female) (male): Secondary | ICD-10-CM | POA: Diagnosis not present

## 2017-08-08 DIAGNOSIS — R2689 Other abnormalities of gait and mobility: Secondary | ICD-10-CM | POA: Diagnosis not present

## 2017-08-08 DIAGNOSIS — G20 Parkinson's disease: Secondary | ICD-10-CM | POA: Diagnosis not present

## 2017-08-13 DIAGNOSIS — G20 Parkinson's disease: Secondary | ICD-10-CM | POA: Diagnosis not present

## 2017-08-13 DIAGNOSIS — M6281 Muscle weakness (generalized): Secondary | ICD-10-CM | POA: Diagnosis not present

## 2017-08-13 DIAGNOSIS — R2689 Other abnormalities of gait and mobility: Secondary | ICD-10-CM | POA: Diagnosis not present

## 2017-08-13 DIAGNOSIS — N393 Stress incontinence (female) (male): Secondary | ICD-10-CM | POA: Diagnosis not present

## 2017-08-14 DIAGNOSIS — M6281 Muscle weakness (generalized): Secondary | ICD-10-CM | POA: Diagnosis not present

## 2017-08-14 DIAGNOSIS — G20 Parkinson's disease: Secondary | ICD-10-CM | POA: Diagnosis not present

## 2017-08-14 DIAGNOSIS — N393 Stress incontinence (female) (male): Secondary | ICD-10-CM | POA: Diagnosis not present

## 2017-08-14 DIAGNOSIS — R2689 Other abnormalities of gait and mobility: Secondary | ICD-10-CM | POA: Diagnosis not present

## 2017-08-16 DIAGNOSIS — M6281 Muscle weakness (generalized): Secondary | ICD-10-CM | POA: Diagnosis not present

## 2017-08-16 DIAGNOSIS — R2689 Other abnormalities of gait and mobility: Secondary | ICD-10-CM | POA: Diagnosis not present

## 2017-08-16 DIAGNOSIS — N393 Stress incontinence (female) (male): Secondary | ICD-10-CM | POA: Diagnosis not present

## 2017-08-16 DIAGNOSIS — G20 Parkinson's disease: Secondary | ICD-10-CM | POA: Diagnosis not present

## 2017-08-16 NOTE — Progress Notes (Signed)
Subjective:    Patient ID: Bradley Bennett, male    DOB: [birth year], 84 y.o.   MRN: [id number]  HPI The patient is here for follow up.  Ear infection, right:  He is putting drops in both ears.  He was diagnosed at the [place].  He was not using the drops correctly, but just started to use them the correct way.  He denies any pain or itching.  He would like his ears checked just to make sure they are doing okay.    Urinary and stool incontinence:   He is experiencing stool incontinence more than 3 times a week.  This is not something that is new, but has gotten slightly worse.  He was having diarrhea and was taking imodium daily, which was controlling it, but this was causing constipation.  I did recommend trying Metamucil at his last visit, but he states he has not tried this.  He is also currently not taking the Imodium.   Hyperlipidemia: He is taking his medication daily. He is compliant with a low fat/cholesterol diet. He is exercising, but is limited due to his shortness of breath. He denies myalgias.   Hypothyroidism:  He is taking his medication daily.  He denies any recent changes in energy or weight that are unexplained.   Prediabetes:  He is compliant with a low sugar/carbohydrate diet.  He is exercising some.  Anxiety: He is taking his medication daily as prescribed. He denies any side effects from the medication. He feels his anxiety is well controlled and he is happy with his current dose of medication.    Medications and allergies reviewed with patient and updated if appropriate.  Patient Active Problem List   Diagnosis Date Noted  . Benign essential HTN 03/05/2017  . Toe infection 02/16/2017  . Memory changes 01/08/2017  . Bowel incontinence 01/08/2017  . HCAP (healthcare-associated pneumonia) 01/01/2017  . UTI (urinary tract infection) 12/28/2016  . Bleeding from the nose 12/08/2016  . Hypokalemia 10/24/2016  . Bacteremia due to methicillin susceptible Staphylococcus  aureus (MSSA) 10/20/2016  . Hyponatremia 10/20/2016  . Cellulitis 10/19/2016  . Sepsis ([place]) 10/19/2016  . Trigger point of right shoulder region 10/05/2016  . Cervical radiculopathy 09/15/2016  . Chronic respiratory failure ([place]) 08/21/2016  . Chronic right shoulder pain 08/21/2016  . Hypotension 07/15/2016  . Diarrhea 07/14/2016  . Prediabetes 07/13/2016  . Dyspnea 06/15/2016  . Allergic rhinitis 05/25/2016  . COPD exacerbation ([place]) 05/10/2016  . Trigger thumb of both hands 01/15/2016  . COPD (chronic obstructive pulmonary disease) ([place]) 07/16/2015  . Hypoxemia 07/16/2015  . Postinflammatory pulmonary fibrosis ([place]) 07/16/2015  . Parkinson disease ([place]) 07/16/2015  . Hypothyroidism 07/16/2015  . Insomnia 07/16/2015  . Benign prostatic hyperplasia 07/16/2015  . Anxiety 07/16/2015  . Carpal tunnel syndrome, right 07/16/2015  . Chronic lower back pain 07/16/2015  . Macular degeneration 07/16/2015  . CAD in native artery 06/15/2015  . 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 S/P coronary artery stent placement 06/15/2015  . Hyperlipidemia 06/15/2015    Current Outpatient Medications on File Prior to Visit  Medication Sig Dispense Refill  . acetaminophen (TYLENOL) 500 MG tablet Take 500-1,000 mg by mouth every 6 (six) hours as needed for mild pain, moderate pain, fever or headache.     . ADVAIR DISKUS 500-50 MCG/DOSE AEPB INHALE ONE PUFF BY MOUTH INTO THE LUNGS TWO TIMES A DAY 180 each 1  . alfuzosin (UROXATRAL) 10 MG 24 hr tablet Take 1 tablet (  10 mg total) by mouth daily. 90 tablet 1  . aspirin EC 81 MG tablet Take 1 tablet (81 mg total) by mouth daily. 90 tablet 3  . atorvastatin (LIPITOR) 20 MG tablet Take 1 tablet (20 mg total) by mouth daily at 6 PM. 90 tablet 3  . busPIRone (BUSPAR) 5 MG tablet TAKE ONE TABLET BY MOUTH DAILY 90 tablet 1  . carbidopa-levodopa (SINEMET CR) 50-200 MG tablet TAKE ONE TABLET BY MOUTH AT BEDTIME 90 tablet 1  . carbidopa-levodopa (SINEMET IR) 25-100 MG  tablet Take 2 tablets by mouth 4 (four) times daily. 720 tablet 1  . clobetasol ointment (TEMOVATE) 9.93 % Apply 1 application topically 2 (two) times daily. 30 g 1  . finasteride (PROSCAR) 5 MG tablet Take 1 tablet (5 mg total) by mouth every evening.    . fluticasone (FLONASE) 50 MCG/ACT nasal spray Place 1 spray into both nostrils daily. 48 g 1  . gabapentin (NEURONTIN) 100 MG capsule TAKE ONE CAPSULE BY MOUTH AT 8AM AND ONE CAPSULE AT 4PM 180 capsule 1  . gabapentin (NEURONTIN) 400 MG capsule TAKE ONE CAPSULE BY MOUTH AT BEDTIME 90 capsule 1  . INCRUSE ELLIPTA 62.5 MCG/INH AEPB INHALE ONE PUFF INTO THE LUNGS DAILY 90 each 1  . lactose free nutrition (BOOST PLUS) LIQD Take 237 mLs by mouth 3 (three) times daily with meals. 30 Can 0  . levothyroxine (SYNTHROID, LEVOTHROID) 150 MCG tablet TAKE ONE TABLET BY MOUTH DAILY 90 tablet 1  . mirabegron ER (MYRBETRIQ) 50 MG TB24 tablet Take 50 mg by mouth every evening.    . Multiple Vitamin (MULTIVITAMIN WITH MINERALS) TABS tablet Take 1 tablet by mouth daily at 12 noon.    . Multiple Vitamins-Minerals (ICAPS AREDS 2 PO) Take 1-2 tablets by mouth See admin instructions. 2 tablets @ noon and 1 tablet @dinner     . mupirocin ointment (BACTROBAN) 2 % Apply to affected area twice daily 22 g 2  . sodium chloride (OCEAN) 0.65 % SOLN nasal spray Place 1 spray into both nostrils as needed for congestion. 1 Bottle 2  . Tiotropium Bromide-Olodaterol (STIOLTO RESPIMAT) 2.5-2.5 MCG/ACT AERS Inhale 2 puffs into the lungs daily. 1 Inhaler 0  . Tiotropium Bromide-Olodaterol (STIOLTO RESPIMAT) 2.5-2.5 MCG/ACT AERS Inhale 2 puffs into the lungs daily. 3 Inhaler 1  . traZODone (DESYREL) 100 MG tablet TAKE ONE TABLET BY MOUTH AT BEDTIME 90 tablet 0  . trimethoprim (TRIMPEX) 100 MG tablet TAKE 1 TABLET BY MOUTH EVERYDAY AT BEDTIME  11   No current facility-administered medications on file prior to visit.     Past Medical History:  Diagnosis Date  . Arthritis   . Asthma    . Coronary artery disease    ASCAD with MI in 1992 and subsequently underwent 4 vessel CABG in [place].  Since then he has had several PCIs with the last one being 2015.    . Emphysema of lung ([place])   . Hearing difficulty of both ears   . Hyperlipidemia   . Hypertension   . ILD (interstitial lung disease) ([place])   . Parkinson disease [facility])     Past Surgical History:  Procedure Laterality Date  . CARPAL TUNNEL RELEASE Right 03/2014  . CORONARY ANGIOPLASTY WITH STENT PLACEMENT    . CORONARY ARTERY BYPASS GRAFT  1992  . HERNIA REPAIR  10/2013   x3   . VEIN BYPASS SURGERY      Social History   Socioeconomic History  . Marital status: Divorced  Spouse name: Not on file  . Number of children: Not on file  . Years of education: Not on file  . Highest education level: Not on file  Occupational History  . Occupation: retired    Comment: [place]  . Financial resource strain: Not on file  . Food insecurity:    Worry: Not on file    Inability: Not on file  . Transportation needs:    Medical: Not on file    Non-medical: Not on file  Tobacco Use  . Smoking status: Former Smoker    Packs/day: 2.00    Years: 35.00    Pack years: 70.00    Last attempt to quit: 08/15/1985    Years since quitting: 32.0  . Smokeless tobacco: Never Used  . Tobacco comment: quit smoking in 1992  Substance and Sexual Activity  . Alcohol use: Yes    Alcohol/week: 0.0 oz    Comment: twice every 6 months  . Drug use: No  . Sexual activity: Never  Lifestyle  . Physical activity:    Days per week: Not on file    Minutes per session: Not on file  . Stress: Not on file  Relationships  . Social connections:    Talks on phone: Not on file    Gets together: Not on file    Attends religious service: Not on file    Active member of club or organization: Not on file    Attends meetings of clubs or organizations: Not on file    Relationship status: Not on file  Other Topics Concern  . Not  on file  Social History Narrative  . Not on file    Family History  Problem Relation Age of Onset  . Colon cancer Father   . Stroke Father   . Arthritis Mother   . Arthritis Sister   . Heart disease Brother   . Kidney cancer Brother     Review of Systems  Constitutional: Negative for appetite change, chills and fever.  Respiratory: Positive for cough (mild, loose) and shortness of breath. Negative for wheezing.   Cardiovascular: Negative for chest pain, palpitations and leg swelling.  Gastrointestinal: Positive for nausea (rare). Negative for abdominal pain.  Neurological: Positive for light-headedness (without oxygen). Negative for headaches.  Psychiatric/Behavioral: Positive for sleep disturbance (controlled). Negative for dysphoric mood. The patient is nervous/anxious (controlled).        Objective:   Vitals:   08/17/17 1515  BP: 118/76  Pulse: 81  Resp: 16  Temp: 97.6 F (36.4 C)  SpO2: 97%   BP Readings from Last 3 Encounters:  08/17/17 118/76  08/07/17 110/68  05/18/17 114/72   Wt Readings from Last 3 Encounters:  08/17/17 177 lb (80.3 kg)  08/07/17 174 lb 4 oz (79 kg)  05/18/17 174 lb (78.9 kg)   Body mass index is 25.4 kg/m.   Physical Exam    Constitutional: Appears well-developed and well-nourished. No distress.  HENT:  Head: Normocephalic and atraumatic.  Neck: Left ear canal and tympanic membrane normal, right ear canal with minimal irritation and mild amount of clear fluid, right tympanic membrane normal.  Neck supple. No tracheal deviation present. No thyromegaly present.  No cervical lymphadenopathy Cardiovascular: Normal rate, regular rhythm and normal heart sounds.   No murmur heard.   mild bilateral lower extremity edema  Pulmonary/Chest: Effort normal and breath sounds normal. No respiratory distress. No has no wheezes. No rales.  Skin: Skin is warm and dry.  Not diaphoretic.  Psychiatric: Normal mood and affect. Behavior is normal.       Assessment & Plan:    See Problem List for Assessment and Plan of chronic medical problems.

## 2017-08-17 ENCOUNTER — Ambulatory Visit (INDEPENDENT_AMBULATORY_CARE_PROVIDER_SITE_OTHER): Payer: Medicare Other | Admitting: Internal Medicine

## 2017-08-17 ENCOUNTER — Other Ambulatory Visit (INDEPENDENT_AMBULATORY_CARE_PROVIDER_SITE_OTHER): Payer: Medicare Other

## 2017-08-17 ENCOUNTER — Encounter: Payer: Self-pay | Admitting: Internal Medicine

## 2017-08-17 VITALS — BP 118/76 | HR 81 | Temp 97.6°F | Resp 16 | Wt 177.0 lb

## 2017-08-17 DIAGNOSIS — R7303 Prediabetes: Secondary | ICD-10-CM

## 2017-08-17 DIAGNOSIS — E7800 Pure hypercholesterolemia, unspecified: Secondary | ICD-10-CM

## 2017-08-17 DIAGNOSIS — I10 Essential (primary) hypertension: Secondary | ICD-10-CM

## 2017-08-17 DIAGNOSIS — E038 Other specified hypothyroidism: Secondary | ICD-10-CM

## 2017-08-17 DIAGNOSIS — I2510 Atherosclerotic heart disease of native coronary artery without angina pectoris: Secondary | ICD-10-CM

## 2017-08-17 DIAGNOSIS — M6281 Muscle weakness (generalized): Secondary | ICD-10-CM | POA: Diagnosis not present

## 2017-08-17 DIAGNOSIS — R2689 Other abnormalities of gait and mobility: Secondary | ICD-10-CM | POA: Diagnosis not present

## 2017-08-17 DIAGNOSIS — G20 Parkinson's disease: Secondary | ICD-10-CM | POA: Diagnosis not present

## 2017-08-17 DIAGNOSIS — F419 Anxiety disorder, unspecified: Secondary | ICD-10-CM | POA: Diagnosis not present

## 2017-08-17 DIAGNOSIS — N393 Stress incontinence (female) (male): Secondary | ICD-10-CM | POA: Diagnosis not present

## 2017-08-17 DIAGNOSIS — R159 Full incontinence of feces: Secondary | ICD-10-CM

## 2017-08-17 LAB — COMPREHENSIVE METABOLIC PANEL
ALBUMIN: 3.9 g/dL (ref 3.5–5.2)
ALK PHOS: 82 U/L (ref 39–117)
ALT: 11 U/L (ref 0–53)
AST: 19 U/L (ref 0–37)
BUN: 14 mg/dL (ref 6–23)
CALCIUM: 9 mg/dL (ref 8.4–10.5)
CO2: 28 mEq/L (ref 19–32)
Chloride: 102 mEq/L (ref 96–112)
Creatinine, Ser: 0.93 mg/dL (ref 0.40–1.50)
GFR: 82.43 mL/min (ref >60.00)
Glucose, Bld: 105 mg/dL — ABNORMAL HIGH (ref 70–99)
POTASSIUM: 4.4 meq/L (ref 3.5–5.1)
Sodium: 138 mEq/L (ref 135–145)
TOTAL PROTEIN: 6.9 g/dL (ref 6.0–8.3)
Total Bilirubin: 0.5 mg/dL (ref 0.2–1.2)

## 2017-08-17 LAB — HEMOGLOBIN A1C: HEMOGLOBIN A1C: 5.8 % (ref 4.6–6.5)

## 2017-08-17 LAB — TSH: TSH: 2.85 u[IU]/mL (ref 0.35–4.50)

## 2017-08-17 NOTE — Patient Instructions (Addendum)
  Test(s) ordered today. Your results will be released to MyChart (or called to you) after review, usually within 72hours after test completion. If any changes need to be made, you will be notified at that same time.  Medications reviewed and updated.  No changes recommended at this time.    Please followup in 6 months   

## 2017-08-17 NOTE — Assessment & Plan Note (Signed)
Check TSH We will adjust medication dose if needed

## 2017-08-17 NOTE — Assessment & Plan Note (Signed)
Has difficulty controlling bowel-only when it is a watery or loose stool Can retry Imodium-may not need a full pill every day Encouraged trying Metamucil

## 2017-08-17 NOTE — Assessment & Plan Note (Signed)
Check a1c Low sugar / carb diet Exercise as tolerated

## 2017-08-17 NOTE — Assessment & Plan Note (Signed)
Continue statin Check CMP Recent lipid panel well controlled

## 2017-08-17 NOTE — Assessment & Plan Note (Signed)
Well-controlled, stable Continue current medication

## 2017-08-19 ENCOUNTER — Encounter: Payer: Self-pay | Admitting: Internal Medicine

## 2017-08-21 ENCOUNTER — Other Ambulatory Visit: Payer: Self-pay | Admitting: Pulmonary Disease

## 2017-08-21 ENCOUNTER — Other Ambulatory Visit: Payer: Self-pay | Admitting: Internal Medicine

## 2017-08-21 DIAGNOSIS — R2689 Other abnormalities of gait and mobility: Secondary | ICD-10-CM | POA: Diagnosis not present

## 2017-08-21 DIAGNOSIS — N393 Stress incontinence (female) (male): Secondary | ICD-10-CM | POA: Diagnosis not present

## 2017-08-21 DIAGNOSIS — M6281 Muscle weakness (generalized): Secondary | ICD-10-CM | POA: Diagnosis not present

## 2017-08-21 DIAGNOSIS — G20 Parkinson's disease: Secondary | ICD-10-CM | POA: Diagnosis not present

## 2017-08-22 DIAGNOSIS — M6281 Muscle weakness (generalized): Secondary | ICD-10-CM | POA: Diagnosis not present

## 2017-08-22 DIAGNOSIS — N393 Stress incontinence (female) (male): Secondary | ICD-10-CM | POA: Diagnosis not present

## 2017-08-22 DIAGNOSIS — G20 Parkinson's disease: Secondary | ICD-10-CM | POA: Diagnosis not present

## 2017-08-22 DIAGNOSIS — R2689 Other abnormalities of gait and mobility: Secondary | ICD-10-CM | POA: Diagnosis not present

## 2017-08-24 DIAGNOSIS — R2689 Other abnormalities of gait and mobility: Secondary | ICD-10-CM | POA: Diagnosis not present

## 2017-08-24 DIAGNOSIS — M6281 Muscle weakness (generalized): Secondary | ICD-10-CM | POA: Diagnosis not present

## 2017-08-24 DIAGNOSIS — N393 Stress incontinence (female) (male): Secondary | ICD-10-CM | POA: Diagnosis not present

## 2017-08-24 DIAGNOSIS — G20 Parkinson's disease: Secondary | ICD-10-CM | POA: Diagnosis not present

## 2017-08-25 ENCOUNTER — Other Ambulatory Visit: Payer: Self-pay | Admitting: Pulmonary Disease

## 2017-08-27 DIAGNOSIS — N393 Stress incontinence (female) (male): Secondary | ICD-10-CM | POA: Diagnosis not present

## 2017-08-27 DIAGNOSIS — R2689 Other abnormalities of gait and mobility: Secondary | ICD-10-CM | POA: Diagnosis not present

## 2017-08-27 DIAGNOSIS — M6281 Muscle weakness (generalized): Secondary | ICD-10-CM | POA: Diagnosis not present

## 2017-08-27 DIAGNOSIS — G20 Parkinson's disease: Secondary | ICD-10-CM | POA: Diagnosis not present

## 2017-08-27 NOTE — Telephone Encounter (Signed)
BQ please advise if ok to refill.  Thanks!

## 2017-08-29 DIAGNOSIS — M6281 Muscle weakness (generalized): Secondary | ICD-10-CM | POA: Diagnosis not present

## 2017-08-29 DIAGNOSIS — R2689 Other abnormalities of gait and mobility: Secondary | ICD-10-CM | POA: Diagnosis not present

## 2017-08-29 DIAGNOSIS — G20 Parkinson's disease: Secondary | ICD-10-CM | POA: Diagnosis not present

## 2017-08-29 DIAGNOSIS — N393 Stress incontinence (female) (male): Secondary | ICD-10-CM | POA: Diagnosis not present

## 2017-08-31 DIAGNOSIS — M6281 Muscle weakness (generalized): Secondary | ICD-10-CM | POA: Diagnosis not present

## 2017-08-31 DIAGNOSIS — G20 Parkinson's disease: Secondary | ICD-10-CM | POA: Diagnosis not present

## 2017-08-31 DIAGNOSIS — N393 Stress incontinence (female) (male): Secondary | ICD-10-CM | POA: Diagnosis not present

## 2017-08-31 DIAGNOSIS — R2689 Other abnormalities of gait and mobility: Secondary | ICD-10-CM | POA: Diagnosis not present

## 2017-09-03 DIAGNOSIS — R2689 Other abnormalities of gait and mobility: Secondary | ICD-10-CM | POA: Diagnosis not present

## 2017-09-03 DIAGNOSIS — M6281 Muscle weakness (generalized): Secondary | ICD-10-CM | POA: Diagnosis not present

## 2017-09-03 DIAGNOSIS — N393 Stress incontinence (female) (male): Secondary | ICD-10-CM | POA: Diagnosis not present

## 2017-09-03 DIAGNOSIS — G20 Parkinson's disease: Secondary | ICD-10-CM | POA: Diagnosis not present

## 2017-09-05 DIAGNOSIS — M6281 Muscle weakness (generalized): Secondary | ICD-10-CM | POA: Diagnosis not present

## 2017-09-05 DIAGNOSIS — G20 Parkinson's disease: Secondary | ICD-10-CM | POA: Diagnosis not present

## 2017-09-05 DIAGNOSIS — N393 Stress incontinence (female) (male): Secondary | ICD-10-CM | POA: Diagnosis not present

## 2017-09-05 DIAGNOSIS — R2689 Other abnormalities of gait and mobility: Secondary | ICD-10-CM | POA: Diagnosis not present

## 2017-09-07 DIAGNOSIS — M6281 Muscle weakness (generalized): Secondary | ICD-10-CM | POA: Diagnosis not present

## 2017-09-07 DIAGNOSIS — N393 Stress incontinence (female) (male): Secondary | ICD-10-CM | POA: Diagnosis not present

## 2017-09-07 DIAGNOSIS — R2689 Other abnormalities of gait and mobility: Secondary | ICD-10-CM | POA: Diagnosis not present

## 2017-09-07 DIAGNOSIS — G20 Parkinson's disease: Secondary | ICD-10-CM | POA: Diagnosis not present

## 2017-09-10 DIAGNOSIS — G20 Parkinson's disease: Secondary | ICD-10-CM | POA: Diagnosis not present

## 2017-09-10 DIAGNOSIS — N393 Stress incontinence (female) (male): Secondary | ICD-10-CM | POA: Diagnosis not present

## 2017-09-10 DIAGNOSIS — M6281 Muscle weakness (generalized): Secondary | ICD-10-CM | POA: Diagnosis not present

## 2017-09-10 DIAGNOSIS — R2689 Other abnormalities of gait and mobility: Secondary | ICD-10-CM | POA: Diagnosis not present

## 2017-09-11 DIAGNOSIS — R350 Frequency of micturition: Secondary | ICD-10-CM | POA: Diagnosis not present

## 2017-09-11 DIAGNOSIS — N3281 Overactive bladder: Secondary | ICD-10-CM | POA: Diagnosis not present

## 2017-09-11 DIAGNOSIS — N401 Enlarged prostate with lower urinary tract symptoms: Secondary | ICD-10-CM | POA: Diagnosis not present

## 2017-09-12 DIAGNOSIS — R2689 Other abnormalities of gait and mobility: Secondary | ICD-10-CM | POA: Diagnosis not present

## 2017-09-12 DIAGNOSIS — G20 Parkinson's disease: Secondary | ICD-10-CM | POA: Diagnosis not present

## 2017-09-12 DIAGNOSIS — N393 Stress incontinence (female) (male): Secondary | ICD-10-CM | POA: Diagnosis not present

## 2017-09-12 DIAGNOSIS — M6281 Muscle weakness (generalized): Secondary | ICD-10-CM | POA: Diagnosis not present

## 2017-09-14 DIAGNOSIS — M6281 Muscle weakness (generalized): Secondary | ICD-10-CM | POA: Diagnosis not present

## 2017-09-14 DIAGNOSIS — N393 Stress incontinence (female) (male): Secondary | ICD-10-CM | POA: Diagnosis not present

## 2017-09-14 DIAGNOSIS — R2689 Other abnormalities of gait and mobility: Secondary | ICD-10-CM | POA: Diagnosis not present

## 2017-09-14 DIAGNOSIS — G20 Parkinson's disease: Secondary | ICD-10-CM | POA: Diagnosis not present

## 2017-09-17 DIAGNOSIS — G20 Parkinson's disease: Secondary | ICD-10-CM | POA: Diagnosis not present

## 2017-09-17 DIAGNOSIS — M6281 Muscle weakness (generalized): Secondary | ICD-10-CM | POA: Diagnosis not present

## 2017-09-17 DIAGNOSIS — N393 Stress incontinence (female) (male): Secondary | ICD-10-CM | POA: Diagnosis not present

## 2017-09-17 DIAGNOSIS — R2689 Other abnormalities of gait and mobility: Secondary | ICD-10-CM | POA: Diagnosis not present

## 2017-09-19 DIAGNOSIS — M6281 Muscle weakness (generalized): Secondary | ICD-10-CM | POA: Diagnosis not present

## 2017-09-19 DIAGNOSIS — N393 Stress incontinence (female) (male): Secondary | ICD-10-CM | POA: Diagnosis not present

## 2017-09-19 DIAGNOSIS — R2689 Other abnormalities of gait and mobility: Secondary | ICD-10-CM | POA: Diagnosis not present

## 2017-09-19 DIAGNOSIS — G20 Parkinson's disease: Secondary | ICD-10-CM | POA: Diagnosis not present

## 2017-09-21 DIAGNOSIS — N393 Stress incontinence (female) (male): Secondary | ICD-10-CM | POA: Diagnosis not present

## 2017-09-21 DIAGNOSIS — R2689 Other abnormalities of gait and mobility: Secondary | ICD-10-CM | POA: Diagnosis not present

## 2017-09-21 DIAGNOSIS — G20 Parkinson's disease: Secondary | ICD-10-CM | POA: Diagnosis not present

## 2017-09-21 DIAGNOSIS — M6281 Muscle weakness (generalized): Secondary | ICD-10-CM | POA: Diagnosis not present

## 2017-09-24 DIAGNOSIS — M6281 Muscle weakness (generalized): Secondary | ICD-10-CM | POA: Diagnosis not present

## 2017-09-24 DIAGNOSIS — N393 Stress incontinence (female) (male): Secondary | ICD-10-CM | POA: Diagnosis not present

## 2017-09-24 DIAGNOSIS — R2689 Other abnormalities of gait and mobility: Secondary | ICD-10-CM | POA: Diagnosis not present

## 2017-09-24 DIAGNOSIS — G20 Parkinson's disease: Secondary | ICD-10-CM | POA: Diagnosis not present

## 2017-09-26 DIAGNOSIS — R2689 Other abnormalities of gait and mobility: Secondary | ICD-10-CM | POA: Diagnosis not present

## 2017-09-26 DIAGNOSIS — M6281 Muscle weakness (generalized): Secondary | ICD-10-CM | POA: Diagnosis not present

## 2017-09-26 DIAGNOSIS — N393 Stress incontinence (female) (male): Secondary | ICD-10-CM | POA: Diagnosis not present

## 2017-09-26 DIAGNOSIS — G20 Parkinson's disease: Secondary | ICD-10-CM | POA: Diagnosis not present

## 2017-10-04 NOTE — Progress Notes (Signed)
Subjective:    Patient ID: Bradley Bennett, male    DOB: 1933/01/02, 84 y.o.   MRN: [id number]  HPI The patient is here for an acute visit for low BP.  His morning BP's have been low, 76/48 recently:  His BP is lowest in the morning-often 70s on top.  In the afternoon it is often still low, but not that low - SBP less than 100.   He is not currently on any blood pressure medication.  He denies lightheadedness, except for when he is low oxygen, or nausea.  He denies chest pain, palpitations and leg edema.  He denies headaches or dizziness.  He he does feel more tied with the lower BP's.  He does drink a good amount of water every day.  Bump on left hand: He has a tender bump on the palm of his left hand.  If he presses on it or applies pressure in the hand it is painful.    Medications and allergies reviewed with patient and updated if appropriate.  Patient Active Problem List   Diagnosis Date Noted  . Memory changes 01/08/2017  . Bowel incontinence 01/08/2017  . HCAP (healthcare-associated pneumonia) 01/01/2017  . Trigger point of right shoulder region 10/05/2016  . Cervical radiculopathy 09/15/2016  . Chronic respiratory failure ([place]) 08/21/2016  . Chronic right shoulder pain 08/21/2016  . Diarrhea 07/14/2016  . Prediabetes 07/13/2016  . Dyspnea 06/15/2016  . Allergic rhinitis 05/25/2016  . Trigger thumb of both hands 01/15/2016  . COPD (chronic obstructive pulmonary disease) ([place]) 07/16/2015  . Hypoxemia 07/16/2015  . Postinflammatory pulmonary fibrosis ([place]) 07/16/2015  . Parkinson disease ([place]) 07/16/2015  . Hypothyroidism 07/16/2015  . Insomnia 07/16/2015  . Benign prostatic hyperplasia 07/16/2015  . Anxiety 07/16/2015  . Carpal tunnel syndrome, right 07/16/2015  . Chronic lower back pain 07/16/2015  . Macular degeneration 07/16/2015  . CAD in native artery 06/15/2015  . 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 S/P coronary artery stent placement 06/15/2015  . Hyperlipidemia  06/15/2015    Current Outpatient Medications on File Prior to Visit  Medication Sig Dispense Refill  . acetaminophen (TYLENOL) 500 MG tablet Take 500-1,000 mg by mouth every 6 (six) hours as needed for mild pain, moderate pain, fever or headache.     . alfuzosin (UROXATRAL) 10 MG 24 hr tablet Take 1 tablet (10 mg total) by mouth daily. 90 tablet 1  . aspirin EC 81 MG tablet Take 1 tablet (81 mg total) by mouth daily. 90 tablet 3  . atorvastatin (LIPITOR) 20 MG tablet Take 1 tablet (20 mg total) by mouth daily at 6 PM. 90 tablet 3  . busPIRone (BUSPAR) 5 MG tablet TAKE ONE TABLET BY MOUTH DAILY 90 tablet 1  . carbidopa-levodopa (SINEMET CR) 50-200 MG tablet TAKE ONE TABLET BY MOUTH AT BEDTIME 90 tablet 1  . carbidopa-levodopa (SINEMET IR) 25-100 MG tablet Take 2 tablets by mouth 4 (four) times daily. 720 tablet 1  . clobetasol ointment (TEMOVATE) 2.45 % Apply 1 application topically 2 (two) times daily. 30 g 1  . finasteride (PROSCAR) 5 MG tablet Take 1 tablet (5 mg total) by mouth every evening.    . fluticasone (FLONASE) 50 MCG/ACT nasal spray Place 1 spray into both nostrils daily. 48 g 1  . gabapentin (NEURONTIN) 100 MG capsule TAKE ONE CAPSULE BY MOUTH AT 8AM AND ONE CAPSULE AT 4PM 180 capsule 1  . gabapentin (NEURONTIN) 400 MG capsule TAKE ONE CAPSULE BY MOUTH AT  BEDTIME 90 capsule 1  . Ibuprofen-diphenhydrAMINE Cit (ADVIL PM PO) Take by mouth.    . lactose free nutrition (BOOST PLUS) LIQD Take 237 mLs by mouth 3 (three) times daily with meals. 30 Can 0  . levothyroxine (SYNTHROID, LEVOTHROID) 150 MCG tablet TAKE ONE TABLET BY MOUTH DAILY 90 tablet 1  . mirabegron ER (MYRBETRIQ) 50 MG TB24 tablet Take 50 mg by mouth every evening.    . Multiple Vitamin (MULTIVITAMIN WITH MINERALS) TABS tablet Take 1 tablet by mouth daily at 12 noon.    . Multiple Vitamins-Minerals (ICAPS AREDS 2 PO) Take 1-2 tablets by mouth See admin instructions. 2 tablets @ noon and 1 tablet @dinner     . mupirocin  ointment (BACTROBAN) 2 % Apply to affected area twice daily 22 g 2  . sodium chloride (OCEAN) 0.65 % SOLN nasal spray Place 1 spray into both nostrils as needed for congestion. 1 Bottle 2  . Tiotropium Bromide-Olodaterol (STIOLTO RESPIMAT) 2.5-2.5 MCG/ACT AERS Inhale 2 puffs into the lungs daily. 3 Inhaler 1  . traZODone (DESYREL) 100 MG tablet TAKE ONE TABLET BY MOUTH AT BEDTIME 90 tablet 0  . trimethoprim (TRIMPEX) 100 MG tablet TAKE 1 TABLET BY MOUTH EVERYDAY AT BEDTIME  11   No current facility-administered medications on file prior to visit.     Past Medical History:  Diagnosis Date  . Arthritis   . Asthma   . Coronary artery disease    ASCAD with MI in 1992 and subsequently underwent 4 vessel CABG in [place].  Since then he has had several PCIs with the last one being 2015.    . Emphysema of lung ([place])   . Hearing difficulty of both ears   . Hyperlipidemia   . Hypertension   . ILD (interstitial lung disease) ([place])   . Parkinson disease [facility])     Past Surgical History:  Procedure Laterality Date  . CARPAL TUNNEL RELEASE Right 03/2014  . CORONARY ANGIOPLASTY WITH STENT PLACEMENT    . CORONARY ARTERY BYPASS GRAFT  1992  . HERNIA REPAIR  10/2013   x3   . VEIN BYPASS SURGERY      Social History   Socioeconomic History  . Marital status: Divorced    Spouse name: Not on file  . Number of children: Not on file  . Years of education: Not on file  . Highest education level: Not on file  Occupational History  . Occupation: retired    Comment: [place]  . Financial resource strain: Not on file  . Food insecurity:    Worry: Not on file    Inability: Not on file  . Transportation needs:    Medical: Not on file    Non-medical: Not on file  Tobacco Use  . Smoking status: Former Smoker    Packs/day: 2.00    Years: 35.00    Pack years: 70.00    Last attempt to quit: 08/15/1985    Years since quitting: 32.1  . Smokeless tobacco: Never Used  . Tobacco comment:  quit smoking in 1992  Substance and Sexual Activity  . Alcohol use: Yes    Alcohol/week: 0.0 standard drinks    Comment: twice every 6 months  . Drug use: No  . Sexual activity: Never  Lifestyle  . Physical activity:    Days per week: Not on file    Minutes per session: Not on file  . Stress: Not on file  Relationships  . Social connections:  Talks on phone: Not on file    Gets together: Not on file    Attends religious service: Not on file    Active member of club or organization: Not on file    Attends meetings of clubs or organizations: Not on file    Relationship status: Not on file  Other Topics Concern  . Not on file  Social History Narrative  . Not on file    Family History  Problem Relation Age of Onset  . Colon cancer Father   . Stroke Father   . Arthritis Mother   . Arthritis Sister   . Heart disease Brother   . Kidney cancer Brother     Review of Systems  Constitutional: Positive for fatigue.  Respiratory: Positive for shortness of breath.   Cardiovascular: Negative for chest pain, palpitations and leg swelling.  Neurological: Positive for light-headedness (only if oxygen is low, not with position changes). Negative for dizziness and headaches.       Objective:   Vitals:   10/05/17 0959  BP: 98/66  Pulse: 62  Resp: 20  Temp: (!) 97.5 F (36.4 C)  SpO2: 91%   BP Readings from Last 3 Encounters:  10/05/17 98/66  08/17/17 118/76  08/07/17 110/68   Wt Readings from Last 3 Encounters:  10/05/17 177 lb 12.8 oz (80.6 kg)  08/17/17 177 lb (80.3 kg)  08/07/17 174 lb 4 oz (79 kg)   Body mass index is 25.51 kg/m.   Physical Exam    Constitutional: Appears well-developed and well-nourished. No distress.  HENT:  Head: Normocephalic and atraumatic.  Neck: Neck supple. No tracheal deviation present. No thyromegaly present.  No cervical lymphadenopathy Cardiovascular: Normal rate, regular rhythm and normal heart sounds.   No murmur heard. No  carotid bruit .  No edema Pulmonary/Chest: Effort normal. No respiratory distress.  End expiratory wheezes diffusely. No rales.  Skin: Skin is warm and dry. Not diaphoretic. Ganglion cyst left palm-mobile, tender with palpation, no overlying erythema Psychiatric: Normal mood and affect. Behavior is normal.       Assessment & Plan:    See Problem List for Assessment and Plan of chronic medical problems.

## 2017-10-05 ENCOUNTER — Ambulatory Visit (INDEPENDENT_AMBULATORY_CARE_PROVIDER_SITE_OTHER): Payer: Medicare Other | Admitting: Internal Medicine

## 2017-10-05 ENCOUNTER — Encounter: Payer: Self-pay | Admitting: Internal Medicine

## 2017-10-05 VITALS — BP 98/66 | HR 62 | Temp 97.5°F | Resp 20 | Ht 70.0 in | Wt 177.8 lb

## 2017-10-05 DIAGNOSIS — I959 Hypotension, unspecified: Secondary | ICD-10-CM

## 2017-10-05 DIAGNOSIS — I2510 Atherosclerotic heart disease of native coronary artery without angina pectoris: Secondary | ICD-10-CM | POA: Diagnosis not present

## 2017-10-05 DIAGNOSIS — M6740 Ganglion, unspecified site: Secondary | ICD-10-CM | POA: Diagnosis not present

## 2017-10-05 DIAGNOSIS — Z23 Encounter for immunization: Secondary | ICD-10-CM

## 2017-10-05 NOTE — Patient Instructions (Addendum)
Contact urology to see what prostate medication we should stop.  Hopefully this will increase his BP enough.   Continue to drink a good amount of fluids.   Do not take any tylenol PM or Advil PM   Call with any questions or concerns.

## 2017-10-05 NOTE — Assessment & Plan Note (Signed)
Left palm, tender Discussed monitoring it or referral to hand surgery for possible removal Would like to monitor for now, but will let me know if he changes his mind

## 2017-10-05 NOTE — Assessment & Plan Note (Signed)
At home his blood pressure is running on the low side in the morning often in the 70s/40s-50s He is asymptomatic He states he does drink a good amount of fluids, but will try drinking slightly more He is on 2 medications for BPH and this may be contributing to his lower blood pressure.  I have asked his daughter to contact his urologist about which of these we can try discontinuing to see if that improves his blood pressure-we will need to monitor for urinary retention If blood pressure is still on the low side may need to consider discontinuing second prostate medication or following up with cardiology

## 2017-10-24 ENCOUNTER — Encounter: Payer: Self-pay | Admitting: Internal Medicine

## 2017-10-28 ENCOUNTER — Other Ambulatory Visit: Payer: Self-pay | Admitting: Neurology

## 2017-10-28 ENCOUNTER — Other Ambulatory Visit: Payer: Self-pay | Admitting: Internal Medicine

## 2017-10-28 ENCOUNTER — Encounter: Payer: Self-pay | Admitting: Internal Medicine

## 2017-10-29 MED ORDER — BUSPIRONE HCL 5 MG PO TABS: 5.0000 mg | ORAL_TABLET | Freq: Two times a day (BID) | ORAL | 1 refills | Status: DC

## 2017-11-13 ENCOUNTER — Encounter: Payer: Self-pay | Admitting: Internal Medicine

## 2017-11-14 NOTE — Telephone Encounter (Signed)
Letter written.  Form in envelope

## 2017-12-03 MED ORDER — ATORVASTATIN CALCIUM 20 MG PO TABS: 20.0000 mg | ORAL_TABLET | Freq: Every day | ORAL | 1 refills | Status: DC

## 2017-12-03 NOTE — Telephone Encounter (Signed)
Spoke with patient's daughter per dpr, she stated the pharmacy did not have a record of our prescription, PCP had refilled last. Resent to correct pharmacy enough medications till his next yearly appointment.

## 2017-12-21 ENCOUNTER — Ambulatory Visit (INDEPENDENT_AMBULATORY_CARE_PROVIDER_SITE_OTHER)
Admission: RE | Admit: 2017-12-21 | Discharge: 2017-12-21 | Disposition: A | Discharge status: Home / Self Care | Payer: Medicare Other | Source: Ambulatory Visit | Attending: Adult Health | Admitting: Adult Health

## 2017-12-21 ENCOUNTER — Encounter: Payer: Self-pay | Admitting: Pulmonary Disease

## 2017-12-21 ENCOUNTER — Ambulatory Visit (INDEPENDENT_AMBULATORY_CARE_PROVIDER_SITE_OTHER): Payer: Medicare Other | Admitting: Pulmonary Disease

## 2017-12-21 ENCOUNTER — Ambulatory Visit: Payer: Medicare Other | Admitting: Pulmonary Disease

## 2017-12-21 ENCOUNTER — Telehealth: Payer: Self-pay | Admitting: Pulmonary Disease

## 2017-12-21 VITALS — BP 98/58 | HR 101 | Temp 97.1°F | Ht 70.5 in | Wt 177.0 lb

## 2017-12-21 DIAGNOSIS — J9611 Chronic respiratory failure with hypoxia: Secondary | ICD-10-CM | POA: Diagnosis not present

## 2017-12-21 DIAGNOSIS — I2510 Atherosclerotic heart disease of native coronary artery without angina pectoris: Secondary | ICD-10-CM | POA: Diagnosis not present

## 2017-12-21 DIAGNOSIS — R0600 Dyspnea, unspecified: Secondary | ICD-10-CM | POA: Diagnosis not present

## 2017-12-21 DIAGNOSIS — J449 Chronic obstructive pulmonary disease, unspecified: Secondary | ICD-10-CM | POA: Diagnosis not present

## 2017-12-21 DIAGNOSIS — R05 Cough: Secondary | ICD-10-CM | POA: Diagnosis not present

## 2017-12-21 DIAGNOSIS — J8410 Pulmonary fibrosis, unspecified: Secondary | ICD-10-CM

## 2017-12-21 MED ORDER — AEROCHAMBER MV MISC: 0 refills | Status: AC

## 2017-12-21 MED ORDER — DOXYCYCLINE HYCLATE 100 MG PO TABS: 100.0000 mg | ORAL_TABLET | Freq: Two times a day (BID) | ORAL | 0 refills | Status: DC

## 2017-12-21 MED ORDER — PREDNISONE 10 MG PO TABS: ORAL_TABLET | ORAL | 0 refills | Status: DC

## 2017-12-21 NOTE — Telephone Encounter (Addendum)
Spoke with daughter, she is concerned because he has been wheezing a lot and very consistent for a few days. His oxygen levels are dropping and it takes longer to recover in the 90's. She is afraid he may have pneumonia but he doesn't seem too sick. I made an appt for pt with Tammy Parrett  today at 12:00. She is going to see if she can get him here on time, if not she will call and cancel. Chest xray ordered first to get before appt. Nothing further is needed.

## 2017-12-21 NOTE — Progress Notes (Signed)
@Patient  ID: Bradley Bennett, male    DOB: [birth year], 84 y.o.   MRN: [id number]  Chief Complaint  Patient presents with  . Acute Visit    cough, suspected pnu, cxray before    Referring provider: [name], MD  HPI:  84 year old male patient followed in our office for pulmonary fibrosis (likely from recurrent aspiration), centrilobular emphysema, chronic respiratory failure (6 L oxygen at rest and 15 L of oxygen with exertion)  PMH: Hypertension, hyperlipidemia, CAD, arthritis Smoker/ Smoking History: Former smoker.  70-pack-year smoking history. Maintenance: Stiolto Pt of: Dr. Lake Bennett   Recent [facility] Encounters:   Last seen by Dr. Lake Bennett April/2019.  Patient was encouraged to follow-up in 3 months they did not.  Patient was switched from Advair and Spiriva to [employer].  12/21/2017  - Visit   84 year old male patient presenting today for acute visit with his daughter.  They are reporting the patient has had increased wheezing, coughing over the last 2 to 3 weeks.  Patient is also appeared to be more short of breath at that time.  There has not been any confusion, no chills, no fevers.  Patient does have a cough that is mostly dry occasionally has a productive with thin clear mucus.  Patient reports he continues to have oxygen therapy of 6 L at rest and 15 L with exertion.  Patient reports he has been needing more oxygen at times disease but more short of breath and also been a longer recovery times with exertion.  Patient continues to be adherent with Stiolto Respimat.  Patient does admit that occasionally he misses doses because he does not time the inspiratory breath right and misses out on medication.   Tests:   FENO:  No results found for: NITRICOXIDE  PFT: PFT Results Latest Ref Rng & Units 03/02/2017 01/11/2016  FVC-Pre L 3.52 4.39  FVC-Predicted Pre % 88 108  FVC-Post L - 4.37  FVC-Predicted Post % - 107  Pre FEV1/FVC % % 76 75  Post FEV1/FCV % % -  72  FEV1-Pre L 2.67 3.28  FEV1-Predicted Pre % 94 114  FEV1-Post L - 3.14  DLCO UNC% % - 20  DLCO COR %Predicted % - 24  TLC L - 8.55  TLC % Predicted % - 119  RV % Predicted % - 130    Imaging: Dg Chest 2 View  Result Date: 12/21/2017 CLINICAL DATA:  Cough and wheezing for several days EXAM: CHEST - 2 VIEW COMPARISON:  01/01/2017 FINDINGS: Cardiac shadow is stable. Postsurgical changes are again seen. Diffuse fibrotic changes are again identified throughout both lungs. Apical bullous formation is noted and stable. No new focal abnormality is seen. IMPRESSION: Chronic fibrotic change without acute abnormality. Electronically Signed   By: Bradley Bennett M.D.   On: 12/21/2017 12:02    Chart Review:    Specialty Problems      Pulmonary Problems   COPD (chronic obstructive pulmonary disease) (HCC)   Postinflammatory pulmonary fibrosis ([place])    01/2016 modified barium swallow> normal      Allergic rhinitis   Dyspnea   Chronic respiratory failure (HCC)   HCAP (healthcare-associated pneumonia)      Allergies  Allergen Reactions  . Isosorbide Other (See Comments)    Reaction:  Headaches     Immunization History  Administered Date(s) Administered  . Influenza Split 11/09/2014  . Influenza, High Dose Seasonal PF 12/04/2015, 11/01/2016, 10/05/2017  . Pneumococcal Conjugate-13 01/14/2016  . Pneumococcal Polysaccharide-23 10/12/2014  Past Medical History:  Diagnosis Date  . Arthritis   . Asthma   . Coronary artery disease    ASCAD with MI in 1992 and subsequently underwent 4 vessel CABG in [place].  Since then he has had several PCIs with the last one being 2015.    . Emphysema of lung ([place])   . Hearing difficulty of both ears   . Hyperlipidemia   . Hypertension   . ILD (interstitial lung disease) ([place])   . Parkinson disease ([place])     Tobacco History: Social History   Tobacco Use  Smoking Status Former Smoker  . Packs/day: 2.00  . Years: 35.00  . Pack years:  70.00  . Last attempt to quit: 08/15/1985  . Years since quitting: 32.3  Smokeless Tobacco Never Used  Tobacco Comment   quit smoking in 1992   Counseling given: Yes Comment: quit smoking in 1992   Outpatient Encounter Medications as of 12/21/2017  Medication Sig  . acetaminophen (TYLENOL) 500 MG tablet Take 500-1,000 mg by mouth every 6 (six) hours as needed for mild pain, moderate pain, fever or headache.   Marland Kitchen aspirin EC 81 MG tablet Take 1 tablet (81 mg total) by mouth daily.  Marland Kitchen atorvastatin (LIPITOR) 20 MG tablet Take 1 tablet (20 mg total) by mouth daily at 6 PM.  . busPIRone (BUSPAR) 5 MG tablet Take 1 tablet (5 mg total) by mouth 2 (two) times daily.  . carbidopa-levodopa (SINEMET CR) 50-200 MG tablet TAKE ONE TABLET BY MOUTH AT BEDTIME  . carbidopa-levodopa (SINEMET IR) 25-100 MG tablet Take 2 tablets by mouth 4 (four) times daily.  . carbidopa-levodopa (SINEMET IR) 25-100 MG tablet TAKE TWO TABLETS BY MOUTH FOUR TIMES A DAY  . finasteride (PROSCAR) 5 MG tablet Take 1 tablet (5 mg total) by mouth every evening.  . gabapentin (NEURONTIN) 100 MG capsule TAKE ONE CAPSULE BY MOUTH AT 8AM AND ONE CAPSULE AT 4PM  . gabapentin (NEURONTIN) 400 MG capsule TAKE ONE CAPSULE BY MOUTH AT BEDTIME  . lactose free nutrition (BOOST PLUS) LIQD Take 237 mLs by mouth 3 (three) times daily with meals.  Marland Kitchen levothyroxine (SYNTHROID, LEVOTHROID) 150 MCG tablet TAKE ONE TABLET BY MOUTH DAILY  . mirabegron ER (MYRBETRIQ) 50 MG TB24 tablet Take 50 mg by mouth every evening.  . Multiple Vitamin (MULTIVITAMIN WITH MINERALS) TABS tablet Take 1 tablet by mouth daily at 12 noon.  . Multiple Vitamins-Minerals (ICAPS AREDS 2 PO) Take 1-2 tablets by mouth See admin instructions. 2 tablets @ noon and 1 tablet @dinner   . mupirocin ointment (BACTROBAN) 2 % Apply to affected area twice daily  . sodium chloride (OCEAN) 0.65 % SOLN nasal spray Place 1 spray into both nostrils as needed for congestion.  . Tiotropium  Bromide-Olodaterol (STIOLTO RESPIMAT) 2.5-2.5 MCG/ACT AERS Inhale 2 puffs into the lungs daily.  . traZODone (DESYREL) 100 MG tablet TAKE ONE TABLET BY MOUTH AT BEDTIME  . trimethoprim (TRIMPEX) 100 MG tablet TAKE 1 TABLET BY MOUTH EVERYDAY AT BEDTIME  . clobetasol ointment (TEMOVATE) 6.78 % Apply 1 application topically 2 (two) times daily. (Patient not taking: Reported on 12/21/2017)  . doxycycline (VIBRA-TABS) 100 MG tablet Take 1 tablet (100 mg total) by mouth 2 (two) times daily.  . fluticasone (FLONASE) 50 MCG/ACT nasal spray Place 1 spray into both nostrils daily. (Patient not taking: Reported on 12/21/2017)  . predniSONE (DELTASONE) 10 MG tablet 4 tabs for 2 days, then 3 tabs for 2 days, 2 tabs for 2 days,  then 1 tab for 2 days, then stop  . Spacer/Aero-Holding Chambers (AEROCHAMBER MV) inhaler Use as instructed   No facility-administered encounter medications on file as of 12/21/2017.      Review of Systems  Review of Systems  Constitutional: Positive for fatigue. Negative for activity change, chills, fever and unexpected weight change.  HENT: Positive for congestion. Negative for postnasal drip, rhinorrhea, sinus pressure, sinus pain, sneezing and sore throat.   Eyes: Negative.   Respiratory: Positive for cough (mostly dry with thin mucous ) and shortness of breath. Negative for wheezing.        Denies hemoptypsis and orthopnea  Cardiovascular: Negative for chest pain, palpitations and leg swelling.  Gastrointestinal: Negative for constipation, diarrhea, nausea and vomiting.  Endocrine: Negative.   Musculoskeletal: Negative.   Skin: Negative.   Neurological: Negative for dizziness and headaches.  Psychiatric/Behavioral: Negative.  Negative for dysphoric mood. The patient is not nervous/anxious.   All other systems reviewed and are negative.    Physical Exam  BP (!) 98/58 (BP Location: Right Arm, Cuff Size: Normal)   Pulse (!) 101   Temp (!) 97.1 F (36.2 C) (Oral)   Ht  5' 10.5" (1.791 m)   Wt 177 lb (80.3 kg)   SpO2 90%   BMI 25.04 kg/m   Wt Readings from Last 5 Encounters:  12/21/17 177 lb (80.3 kg)  10/05/17 177 lb 12.8 oz (80.6 kg)  08/17/17 177 lb (80.3 kg)  08/07/17 174 lb 4 oz (79 kg)  05/18/17 174 lb (78.9 kg)     Physical Exam  Constitutional: He is oriented to person, place, and time and well-developed, well-nourished, and in no distress. No distress.  +frail elderly man in wheelchair   HENT:  Head: Normocephalic and atraumatic.  Right Ear: Hearing, tympanic membrane, external ear and ear canal normal.  Left Ear: Hearing, tympanic membrane, external ear and ear canal normal.  Nose: Mucosal edema present. Right sinus exhibits no maxillary sinus tenderness and no frontal sinus tenderness. Left sinus exhibits no maxillary sinus tenderness and no frontal sinus tenderness.  Mouth/Throat: Uvula is midline and oropharynx is clear and moist. No oropharyngeal exudate.  +PND  Eyes: Pupils are equal, round, and reactive to light.  Neck: Normal range of motion. Neck supple. No JVD present.  Cardiovascular: Regular rhythm and normal heart sounds. Tachycardia present.  Pulmonary/Chest: Effort normal and breath sounds normal. No accessory muscle usage. No respiratory distress. He has no decreased breath sounds. He has no wheezes. He has no rhonchi.  BB crackles   Abdominal: Soft. Bowel sounds are normal. There is no tenderness.  Musculoskeletal: Normal range of motion. He exhibits no edema.  Lymphadenopathy:    He has no cervical adenopathy.  Neurological: He is alert and oriented to person, place, and time. Gait normal.  Skin: Skin is warm and dry. He is not diaphoretic. No erythema.  Psychiatric: Mood, memory, affect and judgment normal.  Nursing note and vitals reviewed.     Lab Results:  CBC    Component Value Date/Time   WBC 14.6 (H) 12/30/2016 0519   RBC 3.74 (L) 12/30/2016 0519   HGB 11.0 (L) 12/30/2016 0519   HCT 34.0 (L)  12/30/2016 0519   PLT 235 12/30/2016 0519   MCV 90.9 12/30/2016 0519   MCH 29.4 12/30/2016 0519   MCHC 32.4 12/30/2016 0519   RDW 17.2 (H) 12/30/2016 0519   LYMPHSABS 1.2 12/28/2016 1405   MONOABS 1.4 (H) 12/28/2016 1405   EOSABS 0.2 12/28/2016  1405   BASOSABS 0.0 12/28/2016 1405    BMET    Component Value Date/Time   NA 138 08/17/2017 1559   K 4.4 08/17/2017 1559   CL 102 08/17/2017 1559   CO2 28 08/17/2017 1559   GLUCOSE 105 (H) 08/17/2017 1559   BUN 14 08/17/2017 1559   CREATININE 0.93 08/17/2017 1559   CALCIUM 9.0 08/17/2017 1559   GFRNONAA >60 12/30/2016 0519   GFRAA >60 12/30/2016 0519    BNP    Component Value Date/Time   BNP 187.2 (H) 05/10/2016 1405    ProBNP    Component Value Date/Time   PROBNP 39.0 10/26/2016 1728      Assessment & Plan:   Pleasant 84 year old male patient completing acute visit with our office today.  Chest x-ray shows stable chronic fibrotic changes.  No acute changes or pneumonia.  This is good news.  We will have patient follow-up with [place] closely as well as will treat as COPD exacerbation.  Will treat with prednisone which patient reports he tolerates quite well as well as an antibiotic such as doxycycline.  Patient to continue swallow precautions. If symptoms worsen will need to follow up with our office or the ED. Continue stiolto, will add spacer. Could consider nebulized LABA/LAMA in the future if patient adherence is still questioned.   Postinflammatory pulmonary fibrosis (HCC) Chest x-ray today Continue swallow precautions  Continue oxygen therapy as prescribed >>>Ensure that you are maintaining oxygen saturations greater than 90%  Follow-up with our office in 2 to 4 weeks  Follow-up with Dr. Anastasia Pall first available ideally around January or February 2020  COPD (chronic obstructive pulmonary disease) (HCC) Doxycycline >>> 1 100 mg tablet every 12 hours for 7 days >>>take with food  >>>wear sunscreen   Prednisone  10mg  tablet  >>>4 tabs for 2 days, then 3 tabs for 2 days, 2 tabs for 2 days, then 1 tab for 2 days, then stop >>>take with food  >>>take in the morning   Chest x-ray today shows chronic interstitial lung disease which we knew  Stiolto Respimat inhaler >>>2 puffs daily >>>Take this no matter what >>>This is not a rescue inhaler >>> Use the spacer we provided today  Continue swallow precautions  Continue oxygen therapy as prescribed >>>Ensure that you are maintaining oxygen saturations greater than 90%    Follow-up with our office in 2 to 4 weeks  Follow-up with Dr. Anastasia Pall first available ideally around January or February 2020  Chronic respiratory failure ([place])  Continue oxygen therapy as prescribed >>>Ensure that you are maintaining oxygen saturations greater than 90%    Follow-up with our office in 2 to 4 weeks  Follow-up with Dr. Anastasia Pall first available ideally around January or February 2020     Lauraine Rinne, NP 12/21/2017

## 2017-12-21 NOTE — Patient Instructions (Addendum)
Doxycycline >>> 1 100 mg tablet every 12 hours for 7 days >>>take with food  >>>wear sunscreen   Prednisone 10mg  tablet  >>>4 tabs for 2 days, then 3 tabs for 2 days, 2 tabs for 2 days, then 1 tab for 2 days, then stop >>>take with food  >>>take in the morning   Chest x-ray today shows chronic interstitial lung disease which we knew  Stiolto Respimat inhaler >>>2 puffs daily >>>Take this no matter what >>>This is not a rescue inhaler >>> Use the spacer we provided today  Continue swallow precautions  Continue oxygen therapy as prescribed >>>Ensure that you are maintaining oxygen saturations greater than 90%    Follow-up with our office in 2 to 4 weeks  Follow-up with Dr. Anastasia Pall first available ideally around January or February 2020  November/2019 we will be moving! We will no longer be at our [place] location.  Be on the look out for a post card/mailer to let you know we have officially moved.  Our new address and phone number will be:  [address] [place], [place] [address] Telephone number: [phone]  It is flu season:   >>>Remember to be washing your hands regularly, using hand sanitizer, be careful to use around herself with has contact with people who are sick will increase her chances of getting sick yourself. >>> Best ways to protect herself from the flu: Receive the yearly flu vaccine, practice good hand hygiene washing with soap and also using hand sanitizer when available, eat a nutritious meals, get adequate rest, hydrate appropriately   Please contact the office if your symptoms worsen or you have concerns that you are not improving.   Thank you for choosing [facility] for your healthcare, and for allowing [place] to partner with you on your healthcare journey. I am thankful to be able to provide care to you today.   Wyn Quaker FNP-C

## 2017-12-21 NOTE — Assessment & Plan Note (Signed)
  Continue oxygen therapy as prescribed >>>Ensure that you are maintaining oxygen saturations greater than 90%    Follow-up with our office in 2 to 4 weeks  Follow-up with Dr. Anastasia Pall first available ideally around January or February 2020

## 2017-12-21 NOTE — Assessment & Plan Note (Signed)
Doxycycline >>> 1 100 mg tablet every 12 hours for 7 days >>>take with food  >>>wear sunscreen   Prednisone 10mg  tablet  >>>4 tabs for 2 days, then 3 tabs for 2 days, 2 tabs for 2 days, then 1 tab for 2 days, then stop >>>take with food  >>>take in the morning   Chest x-ray today shows chronic interstitial lung disease which we knew  Stiolto Respimat inhaler >>>2 puffs daily >>>Take this no matter what >>>This is not a rescue inhaler >>> Use the spacer we provided today  Continue swallow precautions  Continue oxygen therapy as prescribed >>>Ensure that you are maintaining oxygen saturations greater than 90%    Follow-up with our office in 2 to 4 weeks  Follow-up with Dr. Anastasia Pall first available ideally around January or February 2020

## 2017-12-21 NOTE — Assessment & Plan Note (Signed)
Chest x-ray today Continue swallow precautions  Continue oxygen therapy as prescribed >>>Ensure that you are maintaining oxygen saturations greater than 90%  Follow-up with our office in 2 to 4 weeks  Follow-up with Dr. Anastasia Pall first available ideally around January or February 2020

## 2017-12-24 NOTE — Progress Notes (Signed)
Discussed results with patient in office.  Nothing further is needed at this time.  Brian Mack FNP  

## 2017-12-27 NOTE — Progress Notes (Signed)
Bradley Bennett was seen today in the movement disorders clinic for neurologic consultation at the request of Burns, [name], MD.  The consultation is for the evaluation of PD.  This patient is accompanied in the office by his child who supplements the history.  Pt just moved here from [place].  I don't have any previous neurology records.  Pt reports that he was dx with PD about 4 years ago per pt and about 10 years ago per daughter.  His first sx was L hand tremor.  Daughter states he was on something else prior to the levodopa but she isn't sure that it was specifically for PD.  He is currently on carbidopa/levodopa 25/100, 1.5 tablets tid (8:30am/5:30/9pm).  Daughter thinks that the med wears off   11/16/15 update: The patient follows up today, accompanied by his daughter who supplements the history.  Last visit, we talked about changing his dosing of his medication, so that he takes carbidopa/levodopa 25/100, 2 tablets at 8 AM/noon/4 PM and then we added carbidopa/levodopa 50/200 at bedtime.  Daughter thinks that it helps.  He had an MRI of the brain and cervical spine since our last visit.  The MRI of the brain demonstrated mild to moderate white matter disease.  The MRI of the cervical spine demonstrated degenerative changes and spinal stenosis, but nothing surgical noted.  He has not had any falls since last visit.  No hallucinations.  No lightheadedness or near syncope.  He is using melatonin, 10 mg, for sleep and trazodone for sleep.  Doing pulm rehab on tues/thurs but daughter states that more active on the other days.  He is doing Wii bowling.  No hallucinations.  He lives in independent assisted living but he does his own medication.  His daughter prepares the pill box and he has no trouble remembering to take the medication.    03/24/16 update:  Patient follows up today, accompanied by his daughter supplements the history.  He is on carbidopa/levodopa 25/100, 2 tablet at 8 AM/noon/4 PM and  carbidopa/levodopa 50/200 at bedtime.  Noting some tremor in the leg.  Daughter fills his pill box and noting a lot of extra pills at the end of the week.  Patient admits that he forgot the middle of the day dose today (supposed to take at noon and it is 3pm).  He has had no falls.  No hallucinations.  No lightheadedness or near syncope.  Not able to exercise because of pulmonary constraints.  In pulmonary rehab and reviewed those records.  He remains in independent assisted living. Daughter quietly mentions to me that Dr. [place] Bells mentioned hospice.  Noting paresthesias of the hands and feet.    08/23/16 update: Patient seen today in follow-up, accompanied by his daughter who supplements the history.  Patient is on carbidopa/levodopa 25/100, 2 tablets at 8 AM/noon/4 PM and carbidopa/levodopa 50/200 at bed. Has been remembering to take meds.  Has been experiencing low blood pressure and saw PCP on 08/21/16 for this.   The records that were made available to me were reviewed.  Has been attending pulm rehab.  His O2 goes down to 70% there.  His daughter is trying to get him to accept hospice.  He is not ready to do that yet.  12/04/16 update: Patient is seen today in follow-up, accompanied by his daughter who supplements the history.  The patient is on carbidopa/levodopa 25/100, 2 tablets at 8 AM/noon/4 p.m.  He is on carbidopa/levodopa 50/200 at bedtime.  Pt denies falls.  Pt denies lightheadedness, near syncope.  No hallucinations.  Mood has been good.  In sept, he went into pulm arrest and required bag valve mask ventilation and then came back with sepsis.  The records that were made available to me were reviewed.  He has had more tremor.  Daughter states that not taking more lung meds.  He has not tried more levodopa.  His memory ha suffered.  He is living in independent assisted living but daughter is there all the time.  Daughter has applied for [employer] benefits to bring in some help for management of meds/O2.   He  lives at [facility].  Just finished PT and that helped.  He now has a scooter as he has the farthest apartment from the dining hall and he uses all the o2 just getting to the dining room.  No hallucinations.  Not choking on foods but slow to swallow and therefore taking in less calories.  04/05/17 update: The patient is seen today in follow-up, accompanied by his daughter who supplements the history.  Patient is on carbidopa/levodopa 25/100, 2 tablets 4 times per day (an increase) and carbidopa/levodopa 50/200 at bedtime. He reports that he is still only taking carbidopa/levodopa 25/100, 2 po tid (7am/11am/3pm) and carbidopa/levodopa 50/200 at bed because he goes to bed so early (8:30).  Daughter notes more weak and tremulous.   Records have been reviewed since last visit.  He has seen orthopedics regarding cellulitis on his toe which is now resolved.  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  He saw cardiology in January and he was instructed to start baby aspirin due to the fact that he stopped Plavix due to significant bleeding.  08/07/17 update: Patient is seen today in follow-up for Parkinson's disease.  Patient is accompanied by his daughter who supplements history.  Patient is on carbidopa/levodopa 25/100, 2 tablets at 7 AM/11 AM/3 PM and carbidopa/levodopa 50/200 at bedtime.  Daughter states that pt forgets to take med (pt denies) and then becomes tremulous.  They do have an alarmed pillbox at home that will even text his daughter if he does not take it, but he ignores it and they quit using it but his daughter thinks that he they will need to go back to using that.  He is currently using a traditional pillbox that he is just not using properly and is taking pills from the wrong day.  Last visit, entacapone was added to the daytime dosages of levodopa.  His daughter emailed me and stated that she thought it helped tremor, but the  patient thought that it made him more confused, anxious and tired.  They were giving it to him 3 times a day instead of 4 times per day.  They decided to try it twice per day.  It turns out, the medication was causing diarrhea and ultimately needed to be stopped.  He is working with [facility] home health for PT/OT.  He thinks that has been very helpful.  12/28/17 update: Patient is seen today in follow-up for Parkinson's disease, accompanied by his daughter who supplements history.  Patient is on carbidopa/levodopa 25/100, 2 tablets 3 times per day and carbidopa/levodopa 50/200 at bedtime.   No falls.   Records are reviewed since her last visit.  He was seen by the nurse practitioner at pulmonary on December 21, 2017.  He was seen for increasing shortness of breath.  Fortunately, the patient  did not have pneumonia.   Coughing is better after steroids.  He does not think that increased his tremor.  He has been noting that his blood pressure has been very low.  He takes it almost every day.  Systolic blood pressures have been in the 70s and 80s.  He really has been fairly asymptomatic.  He does state that blood pressure seems to rise somewhat throughout the day.  He does not drink very much water as he prefers soda.  He has been to his primary care physician about this.  Notes are reviewed.  PREVIOUS MEDICATIONS: Sinemet; entacapone (diarrhea)  ALLERGIES:   Allergies  Allergen Reactions  . Isosorbide Other (See Comments)    Reaction:  Headaches     CURRENT MEDICATIONS:  Outpatient Encounter Medications as of 12/28/2017  Medication Sig  . acetaminophen (TYLENOL) 500 MG tablet Take 500-1,000 mg by mouth every 6 (six) hours as needed for mild pain, moderate pain, fever or headache.   Marland Kitchen aspirin EC 81 MG tablet Take 1 tablet (81 mg total) by mouth daily.  Marland Kitchen atorvastatin (LIPITOR) 20 MG tablet Take 1 tablet (20 mg total) by mouth daily at 6 PM.  . busPIRone (BUSPAR) 5 MG tablet Take 1 tablet (5 mg total) by  mouth 2 (two) times daily.  . carbidopa-levodopa (SINEMET CR) 50-200 MG tablet TAKE ONE TABLET BY MOUTH AT BEDTIME  . carbidopa-levodopa (SINEMET IR) 25-100 MG tablet Take 2 tablets by mouth 4 (four) times daily. (Patient taking differently: Take 2 tablets by mouth 3 (three) times daily. )  . finasteride (PROSCAR) 5 MG tablet Take 1 tablet (5 mg total) by mouth every evening.  . gabapentin (NEURONTIN) 100 MG capsule TAKE ONE CAPSULE BY MOUTH AT 8AM AND ONE CAPSULE AT 4PM  . gabapentin (NEURONTIN) 400 MG capsule TAKE ONE CAPSULE BY MOUTH AT BEDTIME  . lactose free nutrition (BOOST PLUS) LIQD Take 237 mLs by mouth 3 (three) times daily with meals.  Marland Kitchen levothyroxine (SYNTHROID, LEVOTHROID) 150 MCG tablet TAKE ONE TABLET BY MOUTH DAILY  . Multiple Vitamin (MULTIVITAMIN WITH MINERALS) TABS tablet Take 1 tablet by mouth daily at 12 noon.  . Multiple Vitamins-Minerals (ICAPS AREDS 2 PO) Take 1-2 tablets by mouth See admin instructions. 2 tablets @ noon and 1 tablet @dinner   . sodium chloride (OCEAN) 0.65 % SOLN nasal spray Place 1 spray into both nostrils as needed for congestion.  Marland Kitchen Spacer/Aero-Holding Chambers (AEROCHAMBER MV) inhaler Use as instructed  . Tiotropium Bromide-Olodaterol (STIOLTO RESPIMAT) 2.5-2.5 MCG/ACT AERS Inhale 2 puffs into the lungs daily.  . traZODone (DESYREL) 100 MG tablet TAKE ONE TABLET BY MOUTH AT BEDTIME  . trimethoprim (TRIMPEX) 100 MG tablet TAKE 1 TABLET BY MOUTH EVERYDAY AT BEDTIME  . [DISCONTINUED] mirabegron ER (MYRBETRIQ) 50 MG TB24 tablet Take 50 mg by mouth every evening.  . [DISCONTINUED] carbidopa-levodopa (SINEMET IR) 25-100 MG tablet TAKE TWO TABLETS BY MOUTH FOUR TIMES A DAY  . [DISCONTINUED] clobetasol ointment (TEMOVATE) 2.63 % Apply 1 application topically 2 (two) times daily. (Patient not taking: Reported on 12/21/2017)  . [DISCONTINUED] doxycycline (VIBRA-TABS) 100 MG tablet Take 1 tablet (100 mg total) by mouth 2 (two) times daily.  . [DISCONTINUED]  fluticasone (FLONASE) 50 MCG/ACT nasal spray Place 1 spray into both nostrils daily. (Patient not taking: Reported on 12/21/2017)  . [DISCONTINUED] mupirocin ointment (BACTROBAN) 2 % Apply to affected area twice daily  . [DISCONTINUED] predniSONE (DELTASONE) 10 MG tablet 4 tabs for 2 days, then 3 tabs for 2  days, 2 tabs for 2 days, then 1 tab for 2 days, then stop   No facility-administered encounter medications on file as of 12/28/2017.     PAST MEDICAL HISTORY:   Past Medical History:  Diagnosis Date  . Arthritis   . Asthma   . Coronary artery disease    ASCAD with MI in 1992 and subsequently underwent 4 vessel CABG in [place].  Since then he has had several PCIs with the last one being 2015.    . Emphysema of lung ([place])   . Hearing difficulty of both ears   . Hyperlipidemia   . Hypertension   . ILD (interstitial lung disease) ([place])   . Parkinson disease ([place])     PAST SURGICAL HISTORY:   Past Surgical History:  Procedure Laterality Date  . CARPAL TUNNEL RELEASE Right 03/2014  . CORONARY ANGIOPLASTY WITH STENT PLACEMENT    . CORONARY ARTERY BYPASS GRAFT  1992  . HERNIA REPAIR  10/2013   x3   . VEIN BYPASS SURGERY      SOCIAL HISTORY:   Social History   Socioeconomic History  . Marital status: Divorced    Spouse name: Not on file  . Number of children: Not on file  . Years of education: Not on file  . Highest education level: Not on file  Occupational History  . Occupation: retired    Comment: [employer]  . Financial resource strain: Not on file  . Food insecurity:    Worry: Not on file    Inability: Not on file  . Transportation needs:    Medical: Not on file    Non-medical: Not on file  Tobacco Use  . Smoking status: Former Smoker    Packs/day: 2.00    Years: 35.00    Pack years: 70.00    Last attempt to quit: 08/15/1985    Years since quitting: 32.3  . Smokeless tobacco: Never Used  . Tobacco comment: quit smoking in 1992  Substance and Sexual  Activity  . Alcohol use: Yes    Alcohol/week: 0.0 standard drinks    Comment: twice every 6 months  . Drug use: No  . Sexual activity: Never  Lifestyle  . Physical activity:    Days per week: Not on file    Minutes per session: Not on file  . Stress: Not on file  Relationships  . Social connections:    Talks on phone: Not on file    Gets together: Not on file    Attends religious service: Not on file    Active member of club or organization: Not on file    Attends meetings of clubs or organizations: Not on file    Relationship status: Not on file  . Intimate partner violence:    Fear of current or ex partner: Not on file    Emotionally abused: Not on file    Physically abused: Not on file    Forced sexual activity: Not on file  Other Topics Concern  . Not on file  Social History Narrative  . Not on file    FAMILY HISTORY:   Family Status  Relation Name Status  . Father  Deceased       stroke, colon cancer  . Mother  Deceased       arthritis, ruptured spleen  . Brother  Deceased       MI, DM, kidney cancer  . Sister  Alive       lupus  .  Sister  Alive       heart disease, DM  . Daughter  Alive       2, healthy  . Son  Alive       1, healthy  . Sister  (Not Specified)  . Brother  (Not Specified)    ROS: States that lung function has been stable.  Denies any chest pain.  Denies further diarrhea.  PHYSICAL EXAMINATION:    VITALS:   Vitals:   12/28/17 1546  BP: 118/60  Pulse: 90  SpO2: 94%    Wt Readings from Last 3 Encounters:  12/21/17 177 lb (80.3 kg)  10/05/17 177 lb 12.8 oz (80.6 kg)  08/17/17 177 lb (80.3 kg)     GEN:  The patient appears stated age and is in NAD. HEENT:  Normocephalic, atraumatic.  The mucous membranes are moist. The superficial temporal arteries are without ropiness or tenderness. CV:  RRR Lungs:  CTAB.  He is wearing O2 Neck/HEME:  There are no carotid bruits bilaterally.  Neurological examination:  Orientation: He is  alert and oriented x3. Montreal Cognitive Assessment  08/16/2015  Visuospatial/ Executive (0/5) 5  Naming (0/3) 3  Attention: Read list of digits (0/2) 2  Attention: Read list of letters (0/1) 1  Attention: Serial 7 subtraction starting at 100 (0/3) 3  Language: Repeat phrase (0/2) 2  Language : Fluency (0/1) 1  Abstraction (0/2) 2  Delayed Recall (0/5) 2  Orientation (0/6) 6  Total 27  Adjusted Score (based on education) 27    Cranial nerves: There is good facial symmetry. The speech is fluent and clear. Soft palate rises symmetrically and there is no tongue deviation. Hearing is markedly decreased to conversational tone. Sensation: Sensation is intact to light touch throughout Motor: Strength is at least antigravity x4  Movement examination: Tone: There is normal tone in the upper and lower extremities. Abnormal movements: There is bilateral upper extremity resting tremor. Coordination:  There is no significant decremation with RAM's, with hand opening and closing, finger taps or toe taps today. Gait and Station: The patient is in a wheelchair.  I did not ambulate him as he reports significant shortness of breath with ambulation.   ASSESSMENT/PLAN:  1.  idiopathic Parkinson's disease.  The patient has tremor, bradykinesia, rigidity and Minimal postural instability.  -carbidopa/levodopa 25/100, so that he takes 2 tablets at 7 AM/11 AM/3 PM.   -He will continue carbidopa/levodopa 50/200 at bedtime.  -He really cannot exercise because of pulmonary disease, but he doesn't wish to pursue hospice yet.  I think that most of his limitations are related to pulm disease and not PD.    - He is trying to use resources through the [facility].  2.  Hyperreflexia  -MRI of the brain just demonstrated mild to moderate white matter disease and MRI the cervical spine demonstrates degenerative changes, but no cervical surgical lesions.  3.  Dysphagia with aspiration  -Per pulm records, pt  does have a hx of dysphagia with chronic aspiration/post inflammatory pulm fibrosis but refuses thickening agents.  His last swallow study per the patient was over a year ago, but if he is unwilling to follow recommendations, there is no use repeating it.  Overall, the patient does not feel that he is aspirating or has significant dysphagia any longer, but he does think it is taking him a long time to eat and thinks that he has trouble getting in calories because of this.  We talked about trying  to use Ensure or boost so long as he keeps it away from timing of levodopa.  -pt is DNR  4.  Neurogenic orthostatic hypotension  -Was initially better when he got off of Flomax.  Still on Proscar.  This could be contributing, but ultimately likely related to Parkinson's disease.  He and I discussed this in detail today.  He was unaware that Parkinson's could be causing this.  He takes his blood pressure almost daily and finds that in the morning the systolic blood pressure is in the 70s and 80s.  He, however, has been asymptomatic.  We talked about various medications to raise the blood pressure, but ultimately he decided he really did not want anything right now.  We talked about hydration, which he really is not doing.  He will let me know if he changes his mind on medicines.  We did talk about risks, benefits, and side effects of medications if we decide to add them on the phone, including risks of supine hypertension.  5.  Hypoxia secondary to lung disease  -He is using oxygen   6.   Follow-up 5 to 6 months.  Much greater than 50% of this visit was spent in counseling and coordinating care.  Total face to face time:  25 min

## 2017-12-28 ENCOUNTER — Encounter: Payer: Self-pay | Admitting: Neurology

## 2017-12-28 ENCOUNTER — Ambulatory Visit (INDEPENDENT_AMBULATORY_CARE_PROVIDER_SITE_OTHER): Payer: Medicare Other | Admitting: Neurology

## 2017-12-28 VITALS — BP 118/60 | HR 90

## 2017-12-28 DIAGNOSIS — I2510 Atherosclerotic heart disease of native coronary artery without angina pectoris: Secondary | ICD-10-CM

## 2017-12-28 DIAGNOSIS — G20 Parkinson's disease: Secondary | ICD-10-CM | POA: Diagnosis not present

## 2017-12-28 DIAGNOSIS — G903 Multi-system degeneration of the autonomic nervous system: Secondary | ICD-10-CM

## 2017-12-28 MED ORDER — CARBIDOPA-LEVODOPA 25-100 MG PO TABS: 2.0000 | ORAL_TABLET | Freq: Three times a day (TID) | ORAL | 1 refills | Status: DC

## 2017-12-28 MED ORDER — CARBIDOPA-LEVODOPA ER 50-200 MG PO TBCR: 1.0000 | EXTENDED_RELEASE_TABLET | Freq: Every day | ORAL | 1 refills | Status: DC

## 2018-01-02 ENCOUNTER — Telehealth: Payer: Self-pay | Admitting: Pulmonary Disease

## 2018-01-02 NOTE — Telephone Encounter (Signed)
Medication name and strength: Stiolto Respimat 2.67mcg Provider: [facility] Pharmacy: [facility] Patient insurance ID: [id number] Phone: [phone] Fax: [phone]  Was the PA started on CMM?  Yes If yes, please enter the Key: [id number] Timeframe for approval/denial: 24-72hrs   Will route to Burman Nieves for follow up.

## 2018-01-04 ENCOUNTER — Emergency Department (HOSPITAL_COMMUNITY): Payer: Medicare Other

## 2018-01-04 ENCOUNTER — Other Ambulatory Visit: Payer: Self-pay

## 2018-01-04 ENCOUNTER — Encounter (HOSPITAL_COMMUNITY): Payer: Self-pay | Admitting: Emergency Medicine

## 2018-01-04 ENCOUNTER — Ambulatory Visit: Payer: Self-pay | Admitting: *Deleted

## 2018-01-04 ENCOUNTER — Inpatient Hospital Stay (HOSPITAL_COMMUNITY)
Admission: EM | Admit: 2018-01-04 | Discharge: 2018-01-08 | DRG: 190 | Disposition: A | Discharge status: Home Health | Payer: Medicare Other | Attending: Internal Medicine | Admitting: Internal Medicine

## 2018-01-04 DIAGNOSIS — J441 Chronic obstructive pulmonary disease with (acute) exacerbation: Secondary | ICD-10-CM | POA: Diagnosis not present

## 2018-01-04 DIAGNOSIS — R000 Tachycardia, unspecified: Secondary | ICD-10-CM | POA: Diagnosis not present

## 2018-01-04 DIAGNOSIS — Z515 Encounter for palliative care: Secondary | ICD-10-CM

## 2018-01-04 DIAGNOSIS — J8410 Pulmonary fibrosis, unspecified: Secondary | ICD-10-CM | POA: Diagnosis present

## 2018-01-04 DIAGNOSIS — E872 Acidosis: Secondary | ICD-10-CM | POA: Diagnosis not present

## 2018-01-04 DIAGNOSIS — I10 Essential (primary) hypertension: Secondary | ICD-10-CM | POA: Diagnosis present

## 2018-01-04 DIAGNOSIS — Z87891 Personal history of nicotine dependence: Secondary | ICD-10-CM

## 2018-01-04 DIAGNOSIS — I2729 Other secondary pulmonary hypertension: Secondary | ICD-10-CM | POA: Diagnosis present

## 2018-01-04 DIAGNOSIS — R0602 Shortness of breath: Secondary | ICD-10-CM | POA: Diagnosis not present

## 2018-01-04 DIAGNOSIS — G20 Parkinson's disease: Secondary | ICD-10-CM | POA: Diagnosis present

## 2018-01-04 DIAGNOSIS — R0902 Hypoxemia: Secondary | ICD-10-CM | POA: Diagnosis present

## 2018-01-04 DIAGNOSIS — Z8051 Family history of malignant neoplasm of kidney: Secondary | ICD-10-CM

## 2018-01-04 DIAGNOSIS — Z823 Family history of stroke: Secondary | ICD-10-CM

## 2018-01-04 DIAGNOSIS — R062 Wheezing: Secondary | ICD-10-CM | POA: Diagnosis not present

## 2018-01-04 DIAGNOSIS — J9621 Acute and chronic respiratory failure with hypoxia: Secondary | ICD-10-CM | POA: Diagnosis not present

## 2018-01-04 DIAGNOSIS — R5381 Other malaise: Secondary | ICD-10-CM

## 2018-01-04 DIAGNOSIS — G8929 Other chronic pain: Secondary | ICD-10-CM | POA: Diagnosis present

## 2018-01-04 DIAGNOSIS — N3281 Overactive bladder: Secondary | ICD-10-CM | POA: Diagnosis present

## 2018-01-04 DIAGNOSIS — Z800 Family history of malignant neoplasm of digestive organs: Secondary | ICD-10-CM

## 2018-01-04 DIAGNOSIS — E785 Hyperlipidemia, unspecified: Secondary | ICD-10-CM | POA: Diagnosis present

## 2018-01-04 DIAGNOSIS — J9601 Acute respiratory failure with hypoxia: Secondary | ICD-10-CM | POA: Diagnosis not present

## 2018-01-04 DIAGNOSIS — E039 Hypothyroidism, unspecified: Secondary | ICD-10-CM | POA: Diagnosis present

## 2018-01-04 DIAGNOSIS — I252 Old myocardial infarction: Secondary | ICD-10-CM

## 2018-01-04 DIAGNOSIS — Z8261 Family history of arthritis: Secondary | ICD-10-CM

## 2018-01-04 DIAGNOSIS — Z951 Presence of aortocoronary bypass graft: Secondary | ICD-10-CM

## 2018-01-04 DIAGNOSIS — Z9981 Dependence on supplemental oxygen: Secondary | ICD-10-CM

## 2018-01-04 DIAGNOSIS — Z7982 Long term (current) use of aspirin: Secondary | ICD-10-CM

## 2018-01-04 DIAGNOSIS — M545 Low back pain, unspecified: Secondary | ICD-10-CM | POA: Diagnosis present

## 2018-01-04 DIAGNOSIS — Z8249 Family history of ischemic heart disease and other diseases of the circulatory system: Secondary | ICD-10-CM

## 2018-01-04 DIAGNOSIS — I2510 Atherosclerotic heart disease of native coronary artery without angina pectoris: Secondary | ICD-10-CM | POA: Diagnosis present

## 2018-01-04 DIAGNOSIS — I959 Hypotension, unspecified: Secondary | ICD-10-CM | POA: Diagnosis not present

## 2018-01-04 DIAGNOSIS — F329 Major depressive disorder, single episode, unspecified: Secondary | ICD-10-CM | POA: Diagnosis present

## 2018-01-04 DIAGNOSIS — R05 Cough: Secondary | ICD-10-CM | POA: Diagnosis not present

## 2018-01-04 DIAGNOSIS — Z955 Presence of coronary angioplasty implant and graft: Secondary | ICD-10-CM

## 2018-01-04 DIAGNOSIS — Z79890 Hormone replacement therapy (postmenopausal): Secondary | ICD-10-CM

## 2018-01-04 DIAGNOSIS — F32A Depression, unspecified: Secondary | ICD-10-CM

## 2018-01-04 DIAGNOSIS — Z66 Do not resuscitate: Secondary | ICD-10-CM | POA: Diagnosis present

## 2018-01-04 DIAGNOSIS — J9600 Acute respiratory failure, unspecified whether with hypoxia or hypercapnia: Secondary | ICD-10-CM

## 2018-01-04 DIAGNOSIS — N400 Enlarged prostate without lower urinary tract symptoms: Secondary | ICD-10-CM | POA: Diagnosis present

## 2018-01-04 DIAGNOSIS — R7989 Other specified abnormal findings of blood chemistry: Secondary | ICD-10-CM

## 2018-01-04 DIAGNOSIS — R4702 Dysphasia: Secondary | ICD-10-CM | POA: Diagnosis not present

## 2018-01-04 DIAGNOSIS — Z7189 Other specified counseling: Secondary | ICD-10-CM

## 2018-01-04 LAB — MAGNESIUM: Magnesium: 1.9 mg/dL (ref 1.7–2.4)

## 2018-01-04 LAB — CBC WITH DIFFERENTIAL/PLATELET
Abs Immature Granulocytes: 0.02 10*3/uL (ref 0.00–0.07)
Basophils Absolute: 0 10*3/uL (ref 0.0–0.1)
Basophils Relative: 0 %
Eosinophils Absolute: 0.4 10*3/uL (ref 0.0–0.5)
Eosinophils Relative: 4 %
HCT: 46.7 % (ref 39.0–52.0)
Hemoglobin: 14.6 g/dL (ref 13.0–17.0)
Immature Granulocytes: 0 %
LYMPHS PCT: 14 %
Lymphs Abs: 1.2 10*3/uL (ref 0.7–4.0)
MCH: 30.5 pg (ref 26.0–34.0)
MCHC: 31.3 g/dL (ref 30.0–36.0)
MCV: 97.5 fL (ref 80.0–100.0)
Monocytes Absolute: 0.6 10*3/uL (ref 0.1–1.0)
Monocytes Relative: 7 %
Neutro Abs: 6.6 10*3/uL (ref 1.7–7.7)
Neutrophils Relative %: 75 %
Platelets: 263 10*3/uL (ref 150–400)
RBC: 4.79 MIL/uL (ref 4.22–5.81)
RDW: 14.3 % (ref 11.5–15.5)
WBC: 8.9 10*3/uL (ref 4.0–10.5)
nRBC: 0 % (ref 0.0–0.2)

## 2018-01-04 LAB — BLOOD GAS, ARTERIAL
Acid-base deficit: 3.8 mmol/L — ABNORMAL HIGH (ref 0.0–2.0)
Bicarbonate: 19.4 mmol/L — ABNORMAL LOW (ref 20.0–28.0)
Drawn by: 225631
O2 Content: 8 L/min
O2 Saturation: 94.2 %
PO2 ART: 75.3 mmHg — AB (ref 83.0–108.0)
Patient temperature: 98.6
RATE: 18 resp/min
pCO2 arterial: 31.5 mmHg — ABNORMAL LOW (ref 32.0–48.0)
pH, Arterial: 7.407 (ref 7.350–7.450)

## 2018-01-04 LAB — URINALYSIS, ROUTINE W REFLEX MICROSCOPIC
Bilirubin Urine: NEGATIVE
Glucose, UA: NEGATIVE mg/dL
Hgb urine dipstick: NEGATIVE
Ketones, ur: 5 mg/dL — AB
Leukocytes, UA: NEGATIVE
Nitrite: NEGATIVE
PH: 6 (ref 5.0–8.0)
Protein, ur: NEGATIVE mg/dL
Specific Gravity, Urine: 1.021 (ref 1.005–1.030)

## 2018-01-04 LAB — COMPREHENSIVE METABOLIC PANEL
ALT: 10 U/L (ref 0–44)
AST: 26 U/L (ref 15–41)
Albumin: 3.5 g/dL (ref 3.5–5.0)
Alkaline Phosphatase: 73 U/L (ref 38–126)
Anion gap: 9 (ref 5–15)
BUN: 22 mg/dL (ref 8–23)
CO2: 25 mmol/L (ref 22–32)
Calcium: 8.7 mg/dL — ABNORMAL LOW (ref 8.9–10.3)
Chloride: 106 mmol/L (ref 98–111)
Creatinine, Ser: 0.95 mg/dL (ref 0.61–1.24)
GFR calc Af Amer: >60 mL/min (ref >60)
GFR calc non Af Amer: >60 mL/min (ref >60)
Glucose, Bld: 146 mg/dL — ABNORMAL HIGH (ref 70–99)
Potassium: 4.1 mmol/L (ref 3.5–5.1)
SODIUM: 140 mmol/L (ref 135–145)
Total Bilirubin: 0.9 mg/dL (ref 0.3–1.2)
Total Protein: 6.5 g/dL (ref 6.5–8.1)

## 2018-01-04 LAB — LACTIC ACID, PLASMA
Lactic Acid, Venous: 2.5 mmol/L (ref 0.5–1.9)
Lactic Acid, Venous: 5.6 mmol/L (ref 0.5–1.9)

## 2018-01-04 LAB — TROPONIN I: Troponin I: <0.03 ng/mL (ref <0.03)

## 2018-01-04 MED ORDER — CARBIDOPA-LEVODOPA 25-100 MG PO TABS
2.0000 | ORAL_TABLET | Freq: Three times a day (TID) | ORAL | Status: DC
  Administered 2018-01-04 – 2018-01-08 (×12): 2 via ORAL
  Filled 2018-01-04 (×13): qty 2

## 2018-01-04 MED ORDER — BUSPIRONE HCL 5 MG PO TABS
5.0000 mg | ORAL_TABLET | Freq: Two times a day (BID) | ORAL | Status: DC
  Administered 2018-01-04 – 2018-01-08 (×8): 5 mg via ORAL
  Filled 2018-01-04 (×8): qty 1

## 2018-01-04 MED ORDER — SODIUM CHLORIDE 0.9 % IV BOLUS
500.0000 mL | Freq: Once | INTRAVENOUS | Status: AC
  Administered 2018-01-04: 500 mL via INTRAVENOUS

## 2018-01-04 MED ORDER — ADULT MULTIVITAMIN W/MINERALS CH
1.0000 | ORAL_TABLET | Freq: Every day | ORAL | Status: DC
  Administered 2018-01-05 – 2018-01-08 (×4): 1 via ORAL
  Filled 2018-01-04 (×4): qty 1

## 2018-01-04 MED ORDER — BOOST PLUS PO LIQD
237.0000 mL | Freq: Three times a day (TID) | ORAL | Status: DC
  Administered 2018-01-05 (×2): 237 mL via ORAL
  Administered 2018-01-07 (×2): via ORAL
  Administered 2018-01-08 (×2): 237 mL via ORAL
  Filled 2018-01-04 (×12): qty 237

## 2018-01-04 MED ORDER — ENOXAPARIN SODIUM 40 MG/0.4ML ~~LOC~~ SOLN
40.0000 mg | SUBCUTANEOUS | Status: DC
  Administered 2018-01-04 – 2018-01-07 (×4): 40 mg via SUBCUTANEOUS
  Filled 2018-01-04 (×5): qty 0.4

## 2018-01-04 MED ORDER — FINASTERIDE 5 MG PO TABS
5.0000 mg | ORAL_TABLET | Freq: Every evening | ORAL | Status: DC
  Administered 2018-01-05 – 2018-01-07 (×3): 5 mg via ORAL
  Filled 2018-01-04 (×3): qty 1

## 2018-01-04 MED ORDER — MIRABEGRON ER 25 MG PO TB24
50.0000 mg | ORAL_TABLET | Freq: Every day | ORAL | Status: DC
  Administered 2018-01-04 – 2018-01-08 (×5): 50 mg via ORAL
  Filled 2018-01-04 (×5): qty 2

## 2018-01-04 MED ORDER — CARBIDOPA-LEVODOPA ER 50-200 MG PO TBCR
1.0000 | EXTENDED_RELEASE_TABLET | Freq: Every day | ORAL | Status: DC
  Administered 2018-01-04 – 2018-01-07 (×4): 1 via ORAL
  Filled 2018-01-04 (×5): qty 1

## 2018-01-04 MED ORDER — IPRATROPIUM BROMIDE 0.02 % IN SOLN
0.5000 mg | Freq: Four times a day (QID) | RESPIRATORY_TRACT | Status: DC
  Administered 2018-01-04 – 2018-01-05 (×3): 0.5 mg via RESPIRATORY_TRACT
  Filled 2018-01-04 (×3): qty 2.5

## 2018-01-04 MED ORDER — ALBUTEROL (5 MG/ML) CONTINUOUS INHALATION SOLN
10.0000 mg/h | INHALATION_SOLUTION | RESPIRATORY_TRACT | Status: DC
  Administered 2018-01-04: 10 mg/h via RESPIRATORY_TRACT
  Filled 2018-01-04: qty 20

## 2018-01-04 MED ORDER — ATORVASTATIN CALCIUM 10 MG PO TABS
20.0000 mg | ORAL_TABLET | Freq: Every day | ORAL | Status: DC
  Administered 2018-01-05 – 2018-01-07 (×3): 20 mg via ORAL
  Filled 2018-01-04 (×3): qty 2

## 2018-01-04 MED ORDER — SODIUM CHLORIDE 0.9 % IV SOLN
INTRAVENOUS | Status: DC
  Administered 2018-01-04 – 2018-01-05 (×3): via INTRAVENOUS

## 2018-01-04 MED ORDER — GABAPENTIN 100 MG PO CAPS
100.0000 mg | ORAL_CAPSULE | ORAL | Status: DC
  Administered 2018-01-04 – 2018-01-08 (×8): 100 mg via ORAL
  Filled 2018-01-04 (×7): qty 1

## 2018-01-04 MED ORDER — LEVALBUTEROL HCL 0.63 MG/3ML IN NEBU
0.6300 mg | INHALATION_SOLUTION | Freq: Four times a day (QID) | RESPIRATORY_TRACT | Status: DC
  Administered 2018-01-04 – 2018-01-05 (×4): 0.63 mg via RESPIRATORY_TRACT
  Filled 2018-01-04 (×4): qty 3

## 2018-01-04 MED ORDER — ACETAMINOPHEN 500 MG PO TABS: 500.0000 mg | ORAL_TABLET | Freq: Four times a day (QID) | ORAL | Status: DC | PRN

## 2018-01-04 MED ORDER — TRAZODONE HCL 50 MG PO TABS
100.0000 mg | ORAL_TABLET | Freq: Every day | ORAL | Status: DC
  Administered 2018-01-04 – 2018-01-07 (×4): 100 mg via ORAL
  Filled 2018-01-04 (×4): qty 2

## 2018-01-04 MED ORDER — SODIUM CHLORIDE 0.9 % IV SOLN
1.0000 g | Freq: Three times a day (TID) | INTRAVENOUS | Status: DC
  Administered 2018-01-04 – 2018-01-08 (×12): 1 g via INTRAVENOUS
  Filled 2018-01-04 (×14): qty 1

## 2018-01-04 MED ORDER — METHYLPREDNISOLONE SODIUM SUCC 125 MG IJ SOLR
60.0000 mg | Freq: Every day | INTRAMUSCULAR | Status: DC
  Administered 2018-01-05 – 2018-01-08 (×4): 60 mg via INTRAVENOUS
  Filled 2018-01-04 (×4): qty 2

## 2018-01-04 MED ORDER — GABAPENTIN 400 MG PO CAPS
400.0000 mg | ORAL_CAPSULE | Freq: Every day | ORAL | Status: DC
  Administered 2018-01-04 – 2018-01-07 (×4): 400 mg via ORAL
  Filled 2018-01-04 (×5): qty 1

## 2018-01-04 MED ORDER — ASPIRIN EC 81 MG PO TBEC
81.0000 mg | DELAYED_RELEASE_TABLET | Freq: Every day | ORAL | Status: DC
  Administered 2018-01-04 – 2018-01-08 (×5): 81 mg via ORAL
  Filled 2018-01-04 (×5): qty 1

## 2018-01-04 MED ORDER — TIOTROPIUM BROMIDE-OLODATEROL 2.5-2.5 MCG/ACT IN AERS: 2.0000 | INHALATION_SPRAY | Freq: Every day | RESPIRATORY_TRACT | 1 refills | Status: AC

## 2018-01-04 MED ORDER — LEVOTHYROXINE SODIUM 75 MCG PO TABS
150.0000 ug | ORAL_TABLET | Freq: Every day | ORAL | Status: DC
  Administered 2018-01-05 – 2018-01-08 (×4): 150 ug via ORAL
  Filled 2018-01-04: qty 2
  Filled 2018-01-04: qty 1
  Filled 2018-01-04 (×2): qty 2

## 2018-01-04 MED ORDER — ORAL CARE MOUTH RINSE
15.0000 mL | Freq: Two times a day (BID) | OROMUCOSAL | Status: DC
  Administered 2018-01-04 – 2018-01-08 (×7): 15 mL via OROMUCOSAL

## 2018-01-04 MED ORDER — SODIUM CHLORIDE 0.9 % IV BOLUS
1000.0000 mL | Freq: Once | INTRAVENOUS | Status: AC
  Administered 2018-01-04: 1000 mL via INTRAVENOUS

## 2018-01-04 NOTE — ED Notes (Signed)
ED TO INPATIENT HANDOFF REPORT  Name/Age/Gender Bradley Bennett 84 y.o. male  Code Status    Code Status Orders  (From admission, onward)         Start     Ordered   01/04/18 1904  Do not attempt resuscitation (DNR)  Continuous    Question Answer Comment  In the event of cardiac or respiratory ARREST Do not call a "code blue"   In the event of cardiac or respiratory ARREST Do not perform Intubation, CPR, defibrillation or ACLS   In the event of cardiac or respiratory ARREST Use medication by any route, position, wound care, and other measures to relive pain and suffering. May use oxygen, suction and manual treatment of airway obstruction as needed for comfort.      01/04/18 1908        Code Status History    Date Active Date Inactive Code Status Order ID Comments User Context   12/28/2016 1719 12/30/2016 1832 DNR [id number]  Geradine Girt, DO Inpatient   10/19/2016 2215 10/24/2016 2102 Partial Code [id number]  Tawni Millers, MD ED   05/10/2016 1250 05/12/2016 1940 Full Code [id number]  Melvenia Needles, NP Inpatient   03/24/2016 1506 05/10/2016 1216 DNR [id number]  Ludwig [place]arks, DO Outpatient   08/16/2015 1516 11/08/2015 1017 DNR [id number]  Annamaria Helling, CMA Outpatient      Home/SNF/Other Home  Chief Complaint SOB  Level of Care/Admitting Diagnosis ED Disposition    ED Disposition Condition Comment   Admit  Hospital Area: [facility] [[id number]]  Level of Care: Stepdown [14]  Admit to SDU based on following criteria: Respiratory Distress:  Frequent assessment and/or intervention to maintain adequate ventilation/respiration, pulmonary toilet, and respiratory treatment.  Diagnosis: COPD exacerbation [facility]) [id number]  Admitting Physician: Shela Leff [[id number]]  Attending Physician: Shela Leff [[id number]]  PT Class (Do Not Modify): Observation [104]  PT Acc Code (Do Not Modify): Observation [10022]       Medical History Past  Medical History:  Diagnosis Date  . Arthritis   . Asthma   . Coronary artery disease    ASCAD with MI in 1992 and subsequently underwent 4 vessel CABG in [place].  Since then he has had several PCIs with the last one being 2015.    . Emphysema of lung ([place])   . Hearing difficulty of both ears   . Hyperlipidemia   . Hypertension   . ILD (interstitial lung disease) ([place])   . Parkinson disease ([place])     Allergies Allergies  Allergen Reactions  . Isosorbide Other (See Comments)    Reaction:  Headaches     IV Location/Drains/Wounds Patient Lines/Drains/Airways Status   Active Line/Drains/Airways    Name:   Placement date:   Placement time:   Site:   Days:   Peripheral IV 01/04/18 Right Forearm   01/04/18    1546    Forearm   less than 1   Peripheral IV 01/04/18 Left Forearm   01/04/18    1600    Forearm   less than 1   PICC Single Lumen 62/03/55 PICC Right Basilic 44 cm 0 cm   97/41/63    8453    Basilic   646   Wound / Incision (Open or Dehisced) 10/20/16 Other (Comment) Toe (Comment  which one) Right    10/20/16    2000    Toe (Comment  which one)   441  Labs/Imaging Results for orders placed or performed during the hospital encounter of 01/04/18 (from the past 48 hour(s))  CBC with Differential/Platelet     Status: None   Collection Time: 01/04/18  3:23 PM  Result Value Ref Range   WBC 8.9 4.0 - 10.5 K/uL   RBC 4.79 4.22 - 5.81 MIL/uL   Hemoglobin 14.6 13.0 - 17.0 g/dL   HCT 46.7 39.0 - 52.0 %   MCV 97.5 80.0 - 100.0 fL   MCH 30.5 26.0 - 34.0 pg   MCHC 31.3 30.0 - 36.0 g/dL   RDW 14.3 11.5 - 15.5 %   Platelets 263 150 - 400 K/uL   nRBC 0.0 0.0 - 0.2 %   Neutrophils Relative % 75 %   Neutro Abs 6.6 1.7 - 7.7 K/uL   Lymphocytes Relative 14 %   Lymphs Abs 1.2 0.7 - 4.0 K/uL   Monocytes Relative 7 %   Monocytes Absolute 0.6 0.1 - 1.0 K/uL   Eosinophils Relative 4 %   Eosinophils Absolute 0.4 0.0 - 0.5 K/uL   Basophils Relative 0 %   Basophils Absolute 0.0  0.0 - 0.1 K/uL   Immature Granulocytes 0 %   Abs Immature Granulocytes 0.02 0.00 - 0.07 K/uL    Comment: Performed at [facility], [place] [address], [place], [place] [address]  Comprehensive metabolic panel     Status: Abnormal   Collection Time: 01/04/18  3:23 PM  Result Value Ref Range   Sodium 140 135 - 145 mmol/L   Potassium 4.1 3.5 - 5.1 mmol/L   Chloride 106 98 - 111 mmol/L   CO2 25 22 - 32 mmol/L   Glucose, Bld 146 (H) 70 - 99 mg/dL   BUN 22 8 - 23 mg/dL   Creatinine, Ser 0.95 0.61 - 1.24 mg/dL   Calcium 8.7 (L) 8.9 - 10.3 mg/dL   Total Protein 6.5 6.5 - 8.1 g/dL   Albumin 3.5 3.5 - 5.0 g/dL   AST 26 15 - 41 U/L   ALT 10 0 - 44 U/L   Alkaline Phosphatase 73 38 - 126 U/L   Total Bilirubin 0.9 0.3 - 1.2 mg/dL   GFR calc non Af Amer >60 >60 mL/min   GFR calc Af Amer >60 >60 mL/min   Anion gap 9 5 - 15    Comment: Performed at [facility], [place] [address], [place], [place] [address]  Troponin I - Add-On to previous collection     Status: None   Collection Time: 01/04/18  3:23 PM  Result Value Ref Range   Troponin I <0.03 <0.03 ng/mL    Comment: Performed at [facility], [place] [address], [place], [place] [address]  Lactic acid, plasma     Status: Abnormal   Collection Time: 01/04/18  3:34 PM  Result Value Ref Range   Lactic Acid, Venous 2.5 (HH) 0.5 - 1.9 mmol/L    Comment: CRITICAL RESULT CALLED TO, READ BACK BY AND VERIFIED WITHElaina Hoops RN AT 8882 01/04/18 BY TIBBITTS,K Performed at [facility], [place] [address] [place], [place] [address]   Urinalysis, Routine w reflex microscopic     Status: Abnormal   Collection Time: 01/04/18  4:39 PM  Result Value Ref Range   Color, Urine YELLOW YELLOW   APPearance CLEAR CLEAR   Specific Gravity, Urine 1.021 1.005 - 1.030   pH 6.0 5.0 - 8.0   Glucose, UA NEGATIVE NEGATIVE mg/dL   Hgb urine  dipstick NEGATIVE NEGATIVE   Bilirubin Urine NEGATIVE  NEGATIVE   Ketones, ur 5 (A) NEGATIVE mg/dL   Protein, ur NEGATIVE NEGATIVE mg/dL   Nitrite NEGATIVE NEGATIVE   Leukocytes, UA NEGATIVE NEGATIVE    Comment: Performed at [facility], [place] [address] [place], [place] [address]  Blood gas, arterial     Status: Abnormal   Collection Time: 01/04/18  8:00 PM  Result Value Ref Range   O2 Content 8.0 L/min   Delivery systems NASAL CANNULA    LHR 18 resp/min   pH, Arterial 7.407 7.350 - 7.450   pCO2 arterial 31.5 (L) 32.0 - 48.0 mmHg   pO2, Arterial 75.3 (L) 83.0 - 108.0 mmHg   Bicarbonate 19.4 (L) 20.0 - 28.0 mmol/L   Acid-base deficit 3.8 (H) 0.0 - 2.0 mmol/L   O2 Saturation 94.2 %   Patient temperature 98.6    Collection site RIGHT RADIAL    Drawn by [id number]    Sample type ARTERIAL DRAW    Allens test (pass/fail) PASS PASS    Comment: Performed at [facility], [place] [address] [place], [place] [address]   Dg Chest 2 View  Result Date: 01/04/2018 CLINICAL DATA:  Cough cough EXAM: CHEST - 2 VIEW COMPARISON:  12/21/2017, 01/01/2017 FINDINGS: Coarse interstitial opacity and reticular changes consistent with pulmonary fibrosis, similar as compared with prior exams. No pleural effusion or acute focal airspace disease. Post sternotomy changes. Stable cardiomediastinal silhouette. No pneumothorax. IMPRESSION: Chronic fibrosis without definite acute focal airspace disease. Electronically Signed   By: Donavan Foil M.D.   On: 01/04/2018 17:01   EKG Interpretation  Date/Time:  Friday January 04 2018 15:15:18 EST Ventricular Rate:  95 PR Interval:    QRS Duration: 108 QT Interval:  361 QTC Calculation: 256 R Axis:   54 Text Interpretation:  Sinus rhythm Low voltage, precordial leads Baseline wander When compared with ECG of 12/28/2016 No significant change was found Confirmed by Francine Graven [phone]) on 01/04/2018 4:13:20 PM   Pending Labs Unresulted Labs (From admission, onward)    Start      Ordered   01/05/18 0500  TSH  Tomorrow morning,   R     01/04/18 1908   01/04/18 1908  Lactic acid, plasma  ONCE - STAT,   R     01/04/18 1908   01/04/18 1908  Magnesium  Add-on,   R     01/04/18 1908   01/04/18 1901  HIV antibody  Once,   R     01/04/18 1908   01/04/18 1529  Culture, blood (Routine X 2) w Reflex to ID Panel  BLOOD CULTURE X 2,   R     01/04/18 1528          Vitals/Pain Today's Vitals   01/04/18 2000 01/04/18 2000 01/04/18 2030 01/04/18 2100  BP: 123/73  126/77 113/83  Pulse: (!) 109  (!) 108 (!) 103  Resp: 14  18 19   Temp:      TempSrc:      SpO2: 92%  90% 98%  PainSc:  0-No pain      Isolation Precautions No active isolations  Medications Medications  0.9 %  sodium chloride infusion ( Intravenous New Bag/Given (Non-Interop) 01/04/18 1546)  acetaminophen (TYLENOL) tablet 500-1,000 mg (has no administration in time range)  aspirin EC tablet 81 mg (81 mg Oral Given 01/04/18 2010)  atorvastatin (LIPITOR) tablet 20 mg (has no administration in time range)  busPIRone (BUSPAR) tablet  5 mg (has no administration in time range)  traZODone (DESYREL) tablet 100 mg (has no administration in time range)  levothyroxine (SYNTHROID, LEVOTHROID) tablet 150 mcg (has no administration in time range)  finasteride (PROSCAR) tablet 5 mg (has no administration in time range)  mirabegron ER (MYRBETRIQ) tablet 50 mg (50 mg Oral Given 01/04/18 2011)  carbidopa-levodopa (SINEMET CR) 50-200 MG per tablet controlled release 1 tablet (has no administration in time range)  carbidopa-levodopa (SINEMET IR) 25-100 MG per tablet immediate release 2 tablet (2 tablets Oral Given 01/04/18 2011)  gabapentin (NEURONTIN) capsule 100 mg (100 mg Oral Given 01/04/18 2012)  gabapentin (NEURONTIN) capsule 400 mg (has no administration in time range)  lactose free nutrition (BOOST PLUS) liquid 237 mL (has no administration in time range)  multivitamin with minerals tablet 1 tablet (has no  administration in time range)  enoxaparin (LOVENOX) injection 40 mg (has no administration in time range)  ceFEPIme (MAXIPIME) 1 g in sodium chloride 0.9 % 100 mL IVPB (0 g Intravenous Stopped 01/04/18 2125)  methylPREDNISolone sodium succinate (SOLU-MEDROL) 125 mg/2 mL injection 60 mg (has no administration in time range)  ipratropium (ATROVENT) nebulizer solution 0.5 mg (0.5 mg Nebulization Given 01/04/18 2036)  levalbuterol (XOPENEX) nebulizer solution 0.63 mg (0.63 mg Nebulization Given 01/04/18 2036)  sodium chloride 0.9 % bolus 500 mL (0 mLs Intravenous Stopped 01/04/18 1841)  sodium chloride 0.9 % bolus 1,000 mL (0 mLs Intravenous Stopped 01/04/18 2125)    Mobility walks

## 2018-01-04 NOTE — H&P (Addendum)
History and Physical    Bradley Bennett BDZ:[id number] DOB: 17-Mar-1932 DOA: 01/04/2018  PCP: [name], MD Patient coming from: ALF  Chief Complaint: Dyspnea  HPI: Bradley Bennett is a 84 y.o. male with medical history significant of COPD, centrilobular emphysema, pulmonary fibrosis, CAD status post CABG and PCI, hypertension, hyperlipidemia presenting to the hospital for evaluation of shortness of breath.  Patient reports having chronic dyspnea which has been worse recently.  States at baseline he uses 10 L oxygen at rest and 15 L with exertion.  For the past few days he has required 12 L oxygen at rest.  He has been coughing more and the cough has been productive of more sputum.  Denies having any fevers, chills, or chest pain.  He has been compliant with his home inhaler regimen.  ED Course: Placed on nonrebreather by fire dept. on seen.  Noted to be wheezing and was given DuoNeb, mag, and Solu-Medrol 125 mg. Slightly tachycardic.   No leukocytosis.  Lactic acid 2.5.  UA not suggestive of infection.  Chest x-ray showing chronic fibrosis without definite acute focal airspace disease.  I-STAT troponin negative and EKG not suggestive of ACS. Patient received a 500 cc bolus of normal saline and 1 hr albuterol continuous nebulizer treatment in the ED. Currently on 8 L oxygen via high flow nasal cannula.  Review of Systems: As per HPI otherwise 10 point review of systems negative.  Past Medical History:  Diagnosis Date  . Arthritis   . Asthma   . Coronary artery disease    ASCAD with MI in 1992 and subsequently underwent 4 vessel CABG in [place].  Since then he has had several PCIs with the last one being 2015.    . Emphysema of lung ([place])   . Hearing difficulty of both ears   . Hyperlipidemia   . Hypertension   . ILD (interstitial lung disease) ([place])   . Parkinson disease [facility])     Past Surgical History:  Procedure Laterality Date  . CARPAL TUNNEL RELEASE Right 03/2014  .  CORONARY ANGIOPLASTY WITH STENT PLACEMENT    . CORONARY ARTERY BYPASS GRAFT  1992  . HERNIA REPAIR  10/2013   x3   . VEIN BYPASS SURGERY       reports that he quit smoking about 32 years ago. He has a 70.00 pack-year smoking history. He has never used smokeless tobacco. He reports that he drinks alcohol. He reports that he does not use drugs.  Allergies  Allergen Reactions  . Isosorbide Other (See Comments)    Reaction:  Headaches     Family History  Problem Relation Age of Onset  . Colon cancer Father   . Stroke Father   . Arthritis Mother   . Arthritis Sister   . Heart disease Brother   . Kidney cancer Brother     Prior to Admission medications   Medication Sig Start Date End Date Taking? Authorizing Provider  acetaminophen (TYLENOL) 500 MG tablet Take 500-1,000 mg by mouth every 6 (six) hours as needed for mild pain, moderate pain, fever or headache.    Yes [provider]  aspirin EC 81 MG tablet Take 1 tablet (81 mg total) by mouth daily. 03/06/17 03/06/18 Yes Turner, Eber Hong, MD  atorvastatin (LIPITOR) 20 MG tablet Take 1 tablet (20 mg total) by mouth daily at 6 PM. 12/03/17 06/01/18 Yes Turner, Eber Hong, MD  busPIRone (BUSPAR) 5 MG tablet Take 1 tablet (5 mg total) by  mouth 2 (two) times daily. 10/29/17  Yes Burns, [name], MD  carbidopa-levodopa (SINEMET CR) 50-200 MG tablet Take 1 tablet by mouth at bedtime. 12/28/17  Yes Tat, Eustace Quail, DO  carbidopa-levodopa (SINEMET IR) 25-100 MG tablet Take 2 tablets by mouth 3 (three) times daily. 12/28/17  Yes Tat, Eustace Quail, DO  finasteride (PROSCAR) 5 MG tablet Take 1 tablet (5 mg total) by mouth every evening. 05/12/16  Yes Ollis, Brandi L, NP  gabapentin (NEURONTIN) 100 MG capsule TAKE ONE CAPSULE BY MOUTH AT 8AM AND ONE CAPSULE AT 4PM Patient taking differently: Take 100 mg by mouth 2 (two) times daily. TAKE ONE CAPSULE BY MOUTH AT 8AM AND ONE CAPSULE AT 4PM 10/29/17  Yes Burns, [name], MD  gabapentin (NEURONTIN) 400 MG  capsule TAKE ONE CAPSULE BY MOUTH AT BEDTIME 10/29/17  Yes Burns, [name], MD  Ibuprofen-diphenhydrAMINE HCl (ADVIL PM) 200-25 MG CAPS Take 2 tablets by mouth at bedtime as needed (sleep).   Yes [provider]  lactose free nutrition (BOOST PLUS) LIQD Take 237 mLs by mouth 3 (three) times daily with meals. 10/24/16  Yes Sheikh, Omair Latif, DO  levothyroxine (SYNTHROID, LEVOTHROID) 150 MCG tablet TAKE ONE TABLET BY MOUTH DAILY 08/22/17  Yes Burns, [name], MD  mirabegron ER (MYRBETRIQ) 50 MG TB24 tablet Take 50 mg by mouth daily.   Yes [provider]  Multiple Vitamin (MULTIVITAMIN WITH MINERALS) TABS tablet Take 1 tablet by mouth daily at 12 noon.   Yes [provider]  Multiple Vitamins-Minerals (ICAPS AREDS 2 PO) Take 1-2 tablets by mouth See admin instructions. 2 tablets @ noon and 1 tablet @dinner    Yes [provider]  Tiotropium Bromide-Olodaterol (STIOLTO RESPIMAT) 2.5-2.5 MCG/ACT AERS Inhale 2 puffs into the lungs daily. 01/04/18  Yes Juanito Doom, MD  traZODone (DESYREL) 100 MG tablet TAKE ONE TABLET BY MOUTH AT BEDTIME Patient taking differently: Take 100 mg by mouth at bedtime.  11/05/17  Yes Juanito Doom, MD  trimethoprim (TRIMPEX) 100 MG tablet Take 100 mg by mouth at bedtime.  01/25/17  Yes [provider]  sodium chloride (OCEAN) 0.65 % SOLN nasal spray Place 1 spray into both nostrils as needed for congestion. Patient not taking: Reported on 01/04/2018 05/12/16   Donita Brooks, NP  Spacer/Aero-Holding Chambers (AEROCHAMBER MV) inhaler Use as instructed 12/21/17   Lauraine Rinne, NP    Physical Exam: Vitals:   01/04/18 1630 01/04/18 1742 01/04/18 1800 01/04/18 1830  BP: 112/67 (!) 133/100 111/70 129/67  Pulse: (!) 105 (!) 111 (!) 114 (!) 108  Resp: 18 19 18 20   Temp:      TempSrc:      SpO2: 92% 91% 90% 91%    Physical Exam  Constitutional: He is oriented to person, place, and time. He appears well-developed and  well-nourished. No distress.  HENT:  Head: Normocephalic.  Dry mucous membranes  Eyes: Right eye exhibits no discharge. Left eye exhibits no discharge.  Neck: Neck supple.  Cardiovascular: Regular rhythm and intact distal pulses.  Tachycardic  Pulmonary/Chest: Effort normal. He has no rales.  Mild wheezing  Abdominal: Soft. Bowel sounds are normal. He exhibits no distension. There is no tenderness.  Musculoskeletal: He exhibits no edema.  Neurological: He is alert and oriented to person, place, and time.  Skin: Skin is warm and dry.  Psychiatric: He has a normal mood and affect. His behavior is normal.     Labs on Admission: I have personally  reviewed following labs and imaging studies  CBC: Recent Labs  Lab 01/04/18 1523  WBC 8.9  NEUTROABS 6.6  HGB 14.6  HCT 46.7  MCV 97.5  PLT 846   Basic Metabolic Panel: Recent Labs  Lab 01/04/18 1523  NA 140  K 4.1  CL 106  CO2 25  GLUCOSE 146*  BUN 22  CREATININE 0.95  CALCIUM 8.7*   GFR: CrCl cannot be calculated (Unknown ideal weight.). Liver Function Tests: Recent Labs  Lab 01/04/18 1523  AST 26  ALT 10  ALKPHOS 73  BILITOT 0.9  PROT 6.5  ALBUMIN 3.5   No results for input(s): LIPASE, AMYLASE in the last 168 hours. No results for input(s): AMMONIA in the last 168 hours. Coagulation Profile: No results for input(s): INR, PROTIME in the last 168 hours. Cardiac Enzymes: Recent Labs  Lab 01/04/18 1523  TROPONINI <0.03   BNP (last 3 results) No results for input(s): PROBNP in the last 8760 hours. HbA1C: No results for input(s): HGBA1C in the last 72 hours. CBG: No results for input(s): GLUCAP in the last 168 hours. Lipid Profile: No results for input(s): CHOL, HDL, LDLCALC, TRIG, CHOLHDL, LDLDIRECT in the last 72 hours. Thyroid Function Tests: No results for input(s): TSH, T4TOTAL, FREET4, T3FREE, THYROIDAB in the last 72 hours. Anemia Panel: No results for input(s): VITAMINB12, FOLATE, FERRITIN, TIBC,  IRON, RETICCTPCT in the last 72 hours. Urine analysis:    Component Value Date/Time   COLORURINE YELLOW 01/04/2018 [place] 01/04/2018 1639   LABSPEC 1.021 01/04/2018 1639   PHURINE 6.0 01/04/2018 1639   GLUCOSEU NEGATIVE 01/04/2018 1639   GLUCOSEU NEGATIVE 01/08/2017 1119   HGBUR NEGATIVE 01/04/2018 1639   BILIRUBINUR NEGATIVE 01/04/2018 1639   KETONESUR 5 (A) 01/04/2018 1639   PROTEINUR NEGATIVE 01/04/2018 1639   UROBILINOGEN 0.2 01/08/2017 1119   NITRITE NEGATIVE 01/04/2018 [place] 01/04/2018 1639    Radiological Exams on Admission: Dg Chest 2 View  Result Date: 01/04/2018 CLINICAL DATA:  Cough cough EXAM: CHEST - 2 VIEW COMPARISON:  12/21/2017, 01/01/2017 FINDINGS: Coarse interstitial opacity and reticular changes consistent with pulmonary fibrosis, similar as compared with prior exams. No pleural effusion or acute focal airspace disease. Post sternotomy changes. Stable cardiomediastinal silhouette. No pneumothorax. IMPRESSION: Chronic fibrosis without definite acute focal airspace disease. Electronically Signed   By: Donavan Foil M.D.   On: 01/04/2018 17:01    EKG: Independently reviewed.  Sinus rhythm, heart rate 95.  Assessment/Plan Principal Problem:   Acute respiratory failure (HCC) Active Problems:   CAD in native artery   Hyperlipidemia   Hypoxemia   Parkinson disease (HCC)   Hypothyroidism   BPH (benign prostatic hyperplasia)   Chronic lower back pain   COPD with acute exacerbation (HCC)   Elevated lactic acid level   Physical deconditioning   Depression   Overactive bladder   Acute on chronic hypoxemic respiratory failure secondary to COPD exacerbation and underlying emphysema, pulmonary fibrosis Slightly tachycardic after receiving nebulizer treatments.  Afebrile and no leukocytosis.  Chest x-ray showing chronic fibrosis without definite acute focal airspace disease.  I-STAT troponin negative and EKG not suggestive of  ACS. Patient was initially on nonrebreather.  He has received magnesium, nebulizer treatments, and IV Solu-Medrol 125 mg. Currently on 8 L oxygen via high flow nasal cannula. -Check ABG -Continue supplemental oxygen -IV Solu-Medrol 60 mg daily starting in a.m. -Levalbuterol-ipratropium every 6 hours -IV cefepime -Ambulate and check pulse ox in a.m. -Monitor in the  stepdown unit  Mildly elevated lactic acid Lactic acid mildly elevated at 2.5.  Likely secondary to dehydration, patient appears dry on exam.  Tachycardia and tachypnea likely in the setting of COPD exacerbation.  Infectious etiology less likely as he is afebrile and does not have leukocytosis.  UA not suggestive of infection.  Chest x-ray not suggestive of pneumonia.  Patient received a 500 cc bolus of normal saline in the ED. -Repeat lactic acid level -IV fluid -Blood culture x2 pending  ADDENDUM: ABG revealed pH 7.40, PCO2 31, PO2 75. Lactate 2.5 > 5.6.  Patient was already started on IV cefepime for severe COPD exacerbation.  He has received 2 L fluid boluses so far.  He is currently comfortable and heart rate in the 90s.  Blood pressure stable.  Currently on 10 L high-flow oxygen via nasal cannula.  I was concerned that hypoxemia might be contributing to worsening lactic acidosis and discussed the patient's case with Dr. Jimmy Bennett from PCCM.  Recommendation is to give additional IV fluid and continue to trend lactate. -Will give additional fluid bolus and repeat lactate level -Check procalcitonin level.  Broaden antibiotic coverage if procalcitonin elevated.  Physical deconditioning in setting of chronic lung disease -PT/ OT evaluation -Nutrition consult  CAD s/p CABG and PCI -Patient denies having chest pain.  I-STAT troponin negative and EKG not suggestive of ACS.  Continue home aspirin and Lipitor.  Hypothyroidism -Check TSH level.  Continue home Synthroid.  Hyperlipidemia -Continue Lipitor  Depression -Continue  buspirone, trazodone  Parkinson's disease -Continue home Sinemet CR and IR  BPH -Continue home finasteride  Chronic back pain -Continue home gabapentin, Tylenol  Overactive bladder -Continue home mirabegron   DVT prophylaxis: Lovenox Code Status: DNR.  Discussed with patient and daughter at bedside. Family Communication: Daughter at bedside Disposition Plan: Anticipate discharge to SNF after clinical improvement. Consults called: None Admission status: Observation   Bradley Leff MD [facility] Pager [phone]  If 7PM-7AM, please contact night-coverage [email or web address] Password [facility]  01/04/2018, 7:49 PM

## 2018-01-04 NOTE — ED Provider Notes (Signed)
[facility] DEPT Provider Note   CSN: [id number] Arrival date & time: 01/04/18  1446     History   Chief Complaint Chief Complaint  Patient presents with  . Shortness of Breath    HPI Bradley Bennett is a 84 y.o. male.  The history is provided by the patient. No language interpreter was used.  Shortness of Breath  This is a new problem. The average episode lasts 1 day. The problem occurs continuously.The current episode started 6 to 12 hours ago. The problem has not changed since onset.Associated symptoms include cough and wheezing. Pertinent negatives include no fever. It is unknown what precipitated the problem. He has tried beta-agonist inhalers for the symptoms. Associated medical issues include COPD.  Pt reports he developed increased shortness of breath.  Pt is normally on 8-10 liters of 02   Past Medical History:  Diagnosis Date  . Arthritis   . Asthma   . Coronary artery disease    ASCAD with MI in 1992 and subsequently underwent 4 vessel CABG in [place].  Since then he has had several PCIs with the last one being 2015.    . Emphysema of lung ([place])   . Hearing difficulty of both ears   . Hyperlipidemia   . Hypertension   . ILD (interstitial lung disease) ([place])   . Parkinson disease [facility])     Patient Active Problem List   Diagnosis Date Noted  . Ganglion cyst 10/05/2017  . Memory changes 01/08/2017  . Bowel incontinence 01/08/2017  . HCAP (healthcare-associated pneumonia) 01/01/2017  . Trigger point of right shoulder region 10/05/2016  . Cervical radiculopathy 09/15/2016  . Chronic respiratory failure ([place]) 08/21/2016  . Chronic right shoulder pain 08/21/2016  . Hypotension 07/15/2016  . Diarrhea 07/14/2016  . Prediabetes 07/13/2016  . Dyspnea 06/15/2016  . Allergic rhinitis 05/25/2016  . Trigger thumb of both hands 01/15/2016  . COPD (chronic obstructive pulmonary disease) ([place]) 07/16/2015  . Hypoxemia 07/16/2015  .  Postinflammatory pulmonary fibrosis ([place]) 07/16/2015  . Parkinson disease ([place]) 07/16/2015  . Hypothyroidism 07/16/2015  . Insomnia 07/16/2015  . Benign prostatic hyperplasia 07/16/2015  . Anxiety 07/16/2015  . Carpal tunnel syndrome, right 07/16/2015  . Chronic lower back pain 07/16/2015  . Macular degeneration 07/16/2015  . CAD in native artery 06/15/2015  . 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 S/P coronary artery stent placement 06/15/2015  . Hyperlipidemia 06/15/2015    Past Surgical History:  Procedure Laterality Date  . CARPAL TUNNEL RELEASE Right 03/2014  . CORONARY ANGIOPLASTY WITH STENT PLACEMENT    . CORONARY ARTERY BYPASS GRAFT  1992  . HERNIA REPAIR  10/2013   x3   . VEIN BYPASS SURGERY          Home Medications    Prior to Admission medications   Medication Sig Start Date End Date Taking? Authorizing Provider  acetaminophen (TYLENOL) 500 MG tablet Take 500-1,000 mg by mouth every 6 (six) hours as needed for mild pain, moderate pain, fever or headache.    Yes [provider]  aspirin EC 81 MG tablet Take 1 tablet (81 mg total) by mouth daily. 03/06/17 03/06/18 Yes Turner, Eber Hong, MD  atorvastatin (LIPITOR) 20 MG tablet Take 1 tablet (20 mg total) by mouth daily at 6 PM. 12/03/17 06/01/18 Yes Turner, Eber Hong, MD  busPIRone (BUSPAR) 5 MG tablet Take 1 tablet (5 mg total) by mouth 2 (two) times daily. 10/29/17  Yes [name], MD  carbidopa-levodopa (  SINEMET CR) 50-200 MG tablet Take 1 tablet by mouth at bedtime. 12/28/17  Yes Tat, Eustace Quail, DO  carbidopa-levodopa (SINEMET IR) 25-100 MG tablet Take 2 tablets by mouth 3 (three) times daily. 12/28/17  Yes Tat, Eustace Quail, DO  finasteride (PROSCAR) 5 MG tablet Take 1 tablet (5 mg total) by mouth every evening. 05/12/16  Yes Ollis, Brandi L, NP  gabapentin (NEURONTIN) 100 MG capsule TAKE ONE CAPSULE BY MOUTH AT 8AM AND ONE CAPSULE AT 4PM Patient taking differently: Take 100 mg by mouth 2 (two) times daily. TAKE ONE CAPSULE BY  MOUTH AT 8AM AND ONE CAPSULE AT 4PM 10/29/17  Yes Burns, [name], MD  gabapentin (NEURONTIN) 400 MG capsule TAKE ONE CAPSULE BY MOUTH AT BEDTIME 10/29/17  Yes Burns, [name], MD  Ibuprofen-diphenhydrAMINE HCl (ADVIL PM) 200-25 MG CAPS Take 2 tablets by mouth at bedtime as needed (sleep).   Yes [provider]  lactose free nutrition (BOOST PLUS) LIQD Take 237 mLs by mouth 3 (three) times daily with meals. 10/24/16  Yes Sheikh, Omair Latif, DO  levothyroxine (SYNTHROID, LEVOTHROID) 150 MCG tablet TAKE ONE TABLET BY MOUTH DAILY 08/22/17  Yes Burns, [name], MD  mirabegron ER (MYRBETRIQ) 50 MG TB24 tablet Take 50 mg by mouth daily.   Yes [provider]  Multiple Vitamin (MULTIVITAMIN WITH MINERALS) TABS tablet Take 1 tablet by mouth daily at 12 noon.   Yes [provider]  Multiple Vitamins-Minerals (ICAPS AREDS 2 PO) Take 1-2 tablets by mouth See admin instructions. 2 tablets @ noon and 1 tablet @dinner    Yes [provider]  Tiotropium Bromide-Olodaterol (STIOLTO RESPIMAT) 2.5-2.5 MCG/ACT AERS Inhale 2 puffs into the lungs daily. 01/04/18  Yes Juanito Doom, MD  traZODone (DESYREL) 100 MG tablet TAKE ONE TABLET BY MOUTH AT BEDTIME Patient taking differently: Take 100 mg by mouth at bedtime.  11/05/17  Yes Juanito Doom, MD  trimethoprim (TRIMPEX) 100 MG tablet Take 100 mg by mouth at bedtime.  01/25/17  Yes [provider]  sodium chloride (OCEAN) 0.65 % SOLN nasal spray Place 1 spray into both nostrils as needed for congestion. Patient not taking: Reported on 01/04/2018 05/12/16   Donita Brooks, NP  Spacer/Aero-Holding Chambers (AEROCHAMBER MV) inhaler Use as instructed 12/21/17   Lauraine Rinne, NP    Family History Family History  Problem Relation Age of Onset  . Colon cancer Father   . Stroke Father   . Arthritis Mother   . Arthritis Sister   . Heart disease Brother   . Kidney cancer Brother     Social History Social History    Tobacco Use  . Smoking status: Former Smoker    Packs/day: 2.00    Years: 35.00    Pack years: 70.00    Last attempt to quit: 08/15/1985    Years since quitting: 32.4  . Smokeless tobacco: Never Used  . Tobacco comment: quit smoking in 1992  Substance Use Topics  . Alcohol use: Yes    Alcohol/week: 0.0 standard drinks    Comment: twice every 6 months  . Drug use: No     Allergies   Isosorbide   Review of Systems Review of Systems  Constitutional: Negative for fever.  Respiratory: Positive for cough, shortness of breath and wheezing.   All other systems reviewed and are negative.    Physical Exam Updated Vital Signs BP (!) 133/100 (BP Location: Right Arm)   Pulse (!) 111   Temp 98.1 F (  36.7 C) (Oral)   Resp 19   SpO2 91%   Physical Exam  Constitutional: He is oriented to person, place, and time. He appears well-developed and well-nourished.  HENT:  Head: Normocephalic.  Eyes: EOM are normal.  Neck: Normal range of motion.  Cardiovascular: Normal rate.  Pulmonary/Chest: Effort normal. He has wheezes in the right upper field, the right middle field, the right lower field, the left upper field, the left middle field and the left lower field.  Abdominal: Soft. He exhibits no distension.  Musculoskeletal: Normal range of motion.       Right lower leg: Normal.       Left lower leg: Normal.  Neurological: He is alert and oriented to person, place, and time.  Psychiatric: He has a normal mood and affect.  Nursing note and vitals reviewed.    ED Treatments / Results  Labs (all labs ordered are listed, but only abnormal results are displayed) Labs Reviewed  COMPREHENSIVE METABOLIC PANEL - Abnormal; Notable for the following components:      Result Value   Glucose, Bld 146 (*)    Calcium 8.7 (*)    All other components within normal limits  LACTIC ACID, PLASMA - Abnormal; Notable for the following components:   Lactic Acid, Venous 2.5 (*)    All other  components within normal limits  URINALYSIS, ROUTINE W REFLEX MICROSCOPIC - Abnormal; Notable for the following components:   Ketones, ur 5 (*)    All other components within normal limits  CULTURE, BLOOD (ROUTINE X 2)  CULTURE, BLOOD (ROUTINE X 2)  CBC WITH DIFFERENTIAL/PLATELET  TROPONIN I  CBC    EKG EKG Interpretation  Date/Time:  Friday January 04 2018 15:15:18 EST Ventricular Rate:  95 PR Interval:    QRS Duration: 108 QT Interval:  361 QTC Calculation: 454 R Axis:   54 Text Interpretation:  Sinus rhythm Low voltage, precordial leads Baseline wander When compared with ECG of 12/28/2016 No significant change was found Confirmed by Francine Graven [id number]) on 01/04/2018 4:13:20 PM   Radiology Dg Chest 2 View  Result Date: 01/04/2018 CLINICAL DATA:  Cough cough EXAM: CHEST - 2 VIEW COMPARISON:  12/21/2017, 01/01/2017 FINDINGS: Coarse interstitial opacity and reticular changes consistent with pulmonary fibrosis, similar as compared with prior exams. No pleural effusion or acute focal airspace disease. Post sternotomy changes. Stable cardiomediastinal silhouette. No pneumothorax. IMPRESSION: Chronic fibrosis without definite acute focal airspace disease. Electronically Signed   By: Donavan Foil M.D.   On: 01/04/2018 17:01    Procedures Procedures (including critical care time)  Medications Ordered in ED Medications  0.9 %  sodium chloride infusion ( Intravenous New Bag/Given (Non-Interop) 01/04/18 1546)  albuterol (PROVENTIL,VENTOLIN) solution continuous neb (10 mg/hr Nebulization New Bag/Given 01/04/18 1540)     Initial Impression / Assessment and Plan / ED Course  I have reviewed the triage vital signs and the nursing notes.  Pertinent labs & imaging results that were available during my care of the patient were reviewed by me and considered in my medical decision making (see chart for details).     MDM  Pt given mag, solumedrol and duoneb by EMS.  Pt wheezing on  initial assessment.   Reevaluation  Pt feels much better.  Pt on 8 liters 02 sating 94%.  Lungs decreased wheezing.  Pt able to drink, eating a sandwich.  Chest xray shows no pneumonia.  I suspect exacerbation of COPD.  I will repeat lactic acid   Final Clinical  Impressions(s) / ED Diagnoses   Final diagnoses:  COPD exacerbation [facility])    ED Discharge Orders    None     I spoke with Hospitalist who wiil see for admission    Sidney Ace 01/04/18 [place], Arabi, DO 01/07/18 [phone]

## 2018-01-04 NOTE — Telephone Encounter (Signed)
Received confirmation that Stiolto has been approved through [employer]. Called [employer] and they are aware of the approval. Pt also needed a refill on Stiolto, I went ahead and sent in a refill. Nothing further needed at time of call.

## 2018-01-04 NOTE — Telephone Encounter (Signed)
Pt's daughter laurie calls and states that every time he is on his feet his oxygen level drops to the low 70's and it takes it a while to come up; on 15 liters his oxygen sat remains at 90; she says that she has noticed it for the past couple of weeks intermittently, but consistently on 12/30/17; the pt has interstitial lung disease and is followed by pulmonology and is supposed to be on 6 liters while sitting and 15 liters with exertion; recommendations made per nurse triage;she verbalizes understanding and will have the pt transported to the ED; the pt is normally seen by Dr Quay Burow, [place]; will route to office for notification of this encounter.       Reason for Disposition . Sounds like a life-threatening emergency to the triager  Answer Assessment - Initial Assessment Questions 1. RESPIRATORY STATUS: "Describe your breathing?" (e.g., wheezing, shortness of breath, unable to speak, severe coughing)      Wheezing, shortness of breath 2. ONSET: "When did this breathing problem begin?"      Wheezing 01/01/18 and worsening shortness of breath 12/30/17 3. PATTERN "Does the difficult breathing come and go, or has it been constant since it started?"      Now constant 4. SEVERITY: "How bad is your breathing?" (e.g., mild, moderate, severe)    - MILD: No SOB at rest, mild SOB with walking, speaks normally in sentences, can lay down, no retractions, pulse < 100.    - MODERATE: SOB at rest, SOB with minimal exertion and prefers to sit, cannot lie down flat, speaks in phrases, mild retractions, audible wheezing, pulse 100-120.    - SEVERE: Very SOB at rest, speaks in single words, struggling to breathe, sitting hunched forward, retractions, pulse > 120      moderate 5. RECURRENT SYMPTOM: "Have you had difficulty breathing before?" If so, ask: "When was the last time?" and "What happened that time?"      Yes ongoing due to interstitial lung disease 6. CARDIAC HISTORY: "Do you have any history of heart  disease?" (e.g., heart attack, angina, bypass surgery, angioplasty)      Bypass x 4 7. LUNG HISTORY: "Do you have any history of lung disease?"  (e.g., pulmonary embolus, asthma, emphysema)     Interstitial lung disease 8. CAUSE: "What do you think is causing the breathing problem?"      Ongoing chronic lung disease 9. OTHER SYMPTOMS: "Do you have any other symptoms? (e.g., dizziness, runny nose, cough, chest pain, fever)     Wheezing intermittently 10. PREGNANCY: "Is there any chance you are pregnant?" "When was your last menstrual period?"       N/a 11. TRAVEL: "Have you traveled out of the country in the last month?" (e.g., travel history, exposures)       no  Protocols used: BREATHING DIFFICULTY-A-AH

## 2018-01-04 NOTE — ED Notes (Signed)
First set blood culture collected at the bedside

## 2018-01-04 NOTE — ED Triage Notes (Signed)
Per [facility] pt from [place] [facility] for SOB that is worse with exertion. Pt was placed on NRB by fire on scene. Pt does have wheezing and given duoneb in route. 18g in left wrist. Mag 2g and Solu-Medrol 125mg . Pt was on 10L O2 at home and needs to go up to 15L with exertion.  Daughter is wanting options on skilled living as patient health declining and the care he is needing more care than he gets at his independent living. CBG 152, 100/56, R22,

## 2018-01-04 NOTE — ED Notes (Signed)
Bed: [facility] Expected date:  Expected time:  Means of arrival:  Comments: No bed. 

## 2018-01-05 DIAGNOSIS — N401 Enlarged prostate with lower urinary tract symptoms: Secondary | ICD-10-CM

## 2018-01-05 DIAGNOSIS — Z823 Family history of stroke: Secondary | ICD-10-CM | POA: Diagnosis not present

## 2018-01-05 DIAGNOSIS — J9621 Acute and chronic respiratory failure with hypoxia: Secondary | ICD-10-CM | POA: Diagnosis not present

## 2018-01-05 DIAGNOSIS — N3281 Overactive bladder: Secondary | ICD-10-CM

## 2018-01-05 DIAGNOSIS — Z8261 Family history of arthritis: Secondary | ICD-10-CM | POA: Diagnosis not present

## 2018-01-05 DIAGNOSIS — Z951 Presence of aortocoronary bypass graft: Secondary | ICD-10-CM | POA: Diagnosis not present

## 2018-01-05 DIAGNOSIS — J9611 Chronic respiratory failure with hypoxia: Secondary | ICD-10-CM | POA: Diagnosis not present

## 2018-01-05 DIAGNOSIS — J441 Chronic obstructive pulmonary disease with (acute) exacerbation: Secondary | ICD-10-CM | POA: Diagnosis not present

## 2018-01-05 DIAGNOSIS — R7989 Other specified abnormal findings of blood chemistry: Secondary | ICD-10-CM

## 2018-01-05 DIAGNOSIS — I2729 Other secondary pulmonary hypertension: Secondary | ICD-10-CM | POA: Diagnosis present

## 2018-01-05 DIAGNOSIS — R0902 Hypoxemia: Secondary | ICD-10-CM | POA: Diagnosis not present

## 2018-01-05 DIAGNOSIS — E872 Acidosis: Secondary | ICD-10-CM | POA: Diagnosis not present

## 2018-01-05 DIAGNOSIS — Z8051 Family history of malignant neoplasm of kidney: Secondary | ICD-10-CM | POA: Diagnosis not present

## 2018-01-05 DIAGNOSIS — R5381 Other malaise: Secondary | ICD-10-CM

## 2018-01-05 DIAGNOSIS — Z8249 Family history of ischemic heart disease and other diseases of the circulatory system: Secondary | ICD-10-CM | POA: Diagnosis not present

## 2018-01-05 DIAGNOSIS — I252 Old myocardial infarction: Secondary | ICD-10-CM | POA: Diagnosis not present

## 2018-01-05 DIAGNOSIS — F329 Major depressive disorder, single episode, unspecified: Secondary | ICD-10-CM

## 2018-01-05 DIAGNOSIS — I10 Essential (primary) hypertension: Secondary | ICD-10-CM | POA: Diagnosis not present

## 2018-01-05 DIAGNOSIS — Z515 Encounter for palliative care: Secondary | ICD-10-CM | POA: Diagnosis not present

## 2018-01-05 DIAGNOSIS — N400 Enlarged prostate without lower urinary tract symptoms: Secondary | ICD-10-CM | POA: Diagnosis present

## 2018-01-05 DIAGNOSIS — Z87891 Personal history of nicotine dependence: Secondary | ICD-10-CM | POA: Diagnosis not present

## 2018-01-05 DIAGNOSIS — R9431 Abnormal electrocardiogram [ECG] [EKG]: Secondary | ICD-10-CM | POA: Diagnosis not present

## 2018-01-05 DIAGNOSIS — Z7189 Other specified counseling: Secondary | ICD-10-CM | POA: Diagnosis not present

## 2018-01-05 DIAGNOSIS — Z66 Do not resuscitate: Secondary | ICD-10-CM | POA: Diagnosis present

## 2018-01-05 DIAGNOSIS — E7800 Pure hypercholesterolemia, unspecified: Secondary | ICD-10-CM

## 2018-01-05 DIAGNOSIS — Z955 Presence of coronary angioplasty implant and graft: Secondary | ICD-10-CM | POA: Diagnosis not present

## 2018-01-05 DIAGNOSIS — G8929 Other chronic pain: Secondary | ICD-10-CM | POA: Diagnosis not present

## 2018-01-05 DIAGNOSIS — G20 Parkinson's disease: Secondary | ICD-10-CM

## 2018-01-05 DIAGNOSIS — J8410 Pulmonary fibrosis, unspecified: Secondary | ICD-10-CM | POA: Diagnosis not present

## 2018-01-05 DIAGNOSIS — E038 Other specified hypothyroidism: Secondary | ICD-10-CM

## 2018-01-05 DIAGNOSIS — J9601 Acute respiratory failure with hypoxia: Secondary | ICD-10-CM | POA: Diagnosis not present

## 2018-01-05 DIAGNOSIS — E039 Hypothyroidism, unspecified: Secondary | ICD-10-CM | POA: Diagnosis not present

## 2018-01-05 DIAGNOSIS — I2510 Atherosclerotic heart disease of native coronary artery without angina pectoris: Secondary | ICD-10-CM | POA: Diagnosis not present

## 2018-01-05 DIAGNOSIS — Z800 Family history of malignant neoplasm of digestive organs: Secondary | ICD-10-CM | POA: Diagnosis not present

## 2018-01-05 DIAGNOSIS — E785 Hyperlipidemia, unspecified: Secondary | ICD-10-CM | POA: Diagnosis not present

## 2018-01-05 LAB — BASIC METABOLIC PANEL
Anion gap: 7 (ref 5–15)
BUN: 15 mg/dL (ref 8–23)
CO2: 23 mmol/L (ref 22–32)
Calcium: 8.1 mg/dL — ABNORMAL LOW (ref 8.9–10.3)
Chloride: 112 mmol/L — ABNORMAL HIGH (ref 98–111)
Creatinine, Ser: 0.58 mg/dL — ABNORMAL LOW (ref 0.61–1.24)
GFR calc Af Amer: >60 mL/min (ref >60)
GFR calc non Af Amer: >60 mL/min (ref >60)
Glucose, Bld: 109 mg/dL — ABNORMAL HIGH (ref 70–99)
Potassium: 4.6 mmol/L (ref 3.5–5.1)
Sodium: 142 mmol/L (ref 135–145)

## 2018-01-05 LAB — LACTIC ACID, PLASMA
LACTIC ACID, VENOUS: 2.2 mmol/L — AB (ref 0.5–1.9)
Lactic Acid, Venous: 4 mmol/L (ref 0.5–1.9)

## 2018-01-05 LAB — HIV ANTIBODY (ROUTINE TESTING W REFLEX): HIV Screen 4th Generation wRfx: NONREACTIVE

## 2018-01-05 LAB — TSH: TSH: 0.528 u[IU]/mL (ref 0.350–4.500)

## 2018-01-05 LAB — PROCALCITONIN: Procalcitonin: <0.1 ng/mL

## 2018-01-05 LAB — MAGNESIUM: Magnesium: 1.9 mg/dL (ref 1.7–2.4)

## 2018-01-05 LAB — MRSA PCR SCREENING: MRSA by PCR: NEGATIVE

## 2018-01-05 MED ORDER — SODIUM CHLORIDE 0.9 % IV BOLUS
500.0000 mL | Freq: Once | INTRAVENOUS | Status: AC
  Administered 2018-01-05: 500 mL via INTRAVENOUS

## 2018-01-05 MED ORDER — LEVALBUTEROL HCL 0.63 MG/3ML IN NEBU
0.6300 mg | INHALATION_SOLUTION | Freq: Three times a day (TID) | RESPIRATORY_TRACT | Status: DC
  Administered 2018-01-05 – 2018-01-08 (×8): 0.63 mg via RESPIRATORY_TRACT
  Filled 2018-01-05 (×8): qty 3

## 2018-01-05 MED ORDER — IPRATROPIUM BROMIDE 0.02 % IN SOLN
0.5000 mg | Freq: Three times a day (TID) | RESPIRATORY_TRACT | Status: DC
  Administered 2018-01-05 – 2018-01-08 (×9): 0.5 mg via RESPIRATORY_TRACT
  Filled 2018-01-05 (×9): qty 2.5

## 2018-01-05 MED ORDER — SODIUM CHLORIDE 0.9 % IV BOLUS
1000.0000 mL | Freq: Once | INTRAVENOUS | Status: AC
  Administered 2018-01-05: 500 mL via INTRAVENOUS

## 2018-01-05 NOTE — Progress Notes (Signed)
Dr. Ree Kida notified of pt's runs of Vfib. New order given. pls see lab order. Pt with no symptoms. EKG reveals NSR. Will continue to monitor pt. VWilliams, Therapist, sports.

## 2018-01-05 NOTE — Progress Notes (Signed)
PROGRESS NOTE    Bradley Bennett  EXB:[id number] DOB: Mar 02, 1932 DOA: 01/04/2018 PCP: [name], MD   Brief Narrative:  HPI On 01/04/2018 by Dr. Shela Leff Bradley Bennett is a 84 y.o. male with medical history significant of COPD, centrilobular emphysema, pulmonary fibrosis, CAD status post CABG and PCI, hypertension, hyperlipidemia presenting to the hospital for evaluation of shortness of breath.  Patient reports having chronic dyspnea which has been worse recently.  States at baseline he uses 10 L oxygen at rest and 15 L with exertion.  For the past few days he has required 12 L oxygen at rest.  He has been coughing more and the cough has been productive of more sputum.  Denies having any fevers, chills, or chest pain.  He has been compliant with his home inhaler regimen.  Interim histor Admitted for acute on chronic hypoxemic respiratory failure and COPD exacerbation. Assessment & Plan   Acute on chronic hypoxic respiratory failure secondary to COPD exacerbation with underlying emphysema and pulmonary fibrosis -Patient uses 2 L of home oxygen at rest and 15 L with exertion -Currently afebrile, no leukocytosis -CXR reviewed, showing chronic fribrosis, no infection -blood cultures show no growth -initially required nonrebreather  -Currently on nasal canula, 10L -Continue Solu-Medrol, neb treatments, cefepime  Nonsustained V fib -Patient had nonsustained run of V. Fib -Will check BMP as well as magnesium levels and replace accordingly -Currently asymptomatic  Elevated lactic acidosis -Lactic acid peaked at 5.6, now back down to 2.2 -Suspect worsened by patient's respiratory issues -Admitting physician discussed with Dr. dictating, PCCM, recommended IV fluid resuscitation -Procalcitonin <0.1  Physical deconditioning -PT and OT consulted  CAD -H/o of CABG and PCI -Troponin unremarkable, EKG not suggestive of ACS -No complaints of chest pain -Continue aspirin,  statin  Hypothyroidism -TSH 0.528 -Continue Synthroid  Hyperlipidemia -Continue statin  Depression -Continue buspirone and trazodone  Parkinson's disease -Continue Sinemet  BPH -Continue finasteride  Chronic back pain -Continue gabapentin, Tylenol  Overactive bladder -Continue mirabegron   DVT Prophylaxis  lovenox  Code Status: DNR  Family Communication: None at bedside  Disposition Plan: Currently in observation. Continue to monitor in stepdown- pending PT.   Consultants None  Procedures  None  Antibiotics   Anti-infectives (From admission, onward)   Start     Dose/Rate Route Frequency Ordered Stop   01/04/18 2000  ceFEPIme (MAXIPIME) 1 g in sodium chloride 0.9 % 100 mL IVPB     1 g 200 mL/hr over 30 Minutes Intravenous Every 8 hours 01/04/18 1908 01/09/18 1959      Subjective:   Bradley Bennett seen and examined today.  Patient  has no complaints at this time.  Feels his breathing has mildly improved and close to his baseline however has not been out of bed yet.  Denies current chest pain, abdominal pain, nausea or vomiting, diarrhea or constipation, dizziness or headache. Objective:   Vitals:   01/05/18 0400 01/05/18 0408 01/05/18 0717 01/05/18 0824  BP: (!) 114/59     Pulse: 86     Resp: 20     Temp:  97.7 F (36.5 C)  97.6 F (36.4 C)  TempSrc:  Oral  Oral  SpO2: 93%  96%   Weight:      Height:        Intake/Output Summary (Last 24 hours) at 01/05/2018 1109 Last data filed at 01/05/2018 0518 Gross per 24 hour  Intake 2879 ml  Output 525 ml  Net 2354 ml  Filed Weights   01/04/18 2200  Weight: 81.2 kg    Exam  General: Well developed, elderly, NAD, appears stated age  17: NCAT, mucous membranes moist.   Neck: Supple  Cardiovascular: S1 S2 auscultated, RRR, no murmur  Respiratory: Diminished breath sounds, with mild expiratory wheezing  Abdomen: Soft, nontender, nondistended, + bowel sounds  Extremities: warm dry  without cyanosis clubbing or edema  Neuro: AAOx3, nonfocal   Psych: Normal affect and demeanor    Data Reviewed: I have personally reviewed following labs and imaging studies  CBC: Recent Labs  Lab 01/04/18 1523  WBC 8.9  NEUTROABS 6.6  HGB 14.6  HCT 46.7  MCV 97.5  PLT 161   Basic Metabolic Panel: Recent Labs  Lab 01/04/18 1523 01/04/18 2221  NA 140  --   K 4.1  --   CL 106  --   CO2 25  --   GLUCOSE 146*  --   BUN 22  --   CREATININE 0.95  --   CALCIUM 8.7*  --   MG  --  1.9   GFR: Estimated Creatinine Clearance: 60.1 mL/min (by C-G formula based on SCr of 0.95 mg/dL). Liver Function Tests: Recent Labs  Lab 01/04/18 1523  AST 26  ALT 10  ALKPHOS 73  BILITOT 0.9  PROT 6.5  ALBUMIN 3.5   No results for input(s): LIPASE, AMYLASE in the last 168 hours. No results for input(s): AMMONIA in the last 168 hours. Coagulation Profile: No results for input(s): INR, PROTIME in the last 168 hours. Cardiac Enzymes: Recent Labs  Lab 01/04/18 1523  TROPONINI <0.03   BNP (last 3 results) No results for input(s): PROBNP in the last 8760 hours. HbA1C: No results for input(s): HGBA1C in the last 72 hours. CBG: No results for input(s): GLUCAP in the last 168 hours. Lipid Profile: No results for input(s): CHOL, HDL, LDLCALC, TRIG, CHOLHDL, LDLDIRECT in the last 72 hours. Thyroid Function Tests: Recent Labs    01/05/18 0140  TSH 0.528   Anemia Panel: No results for input(s): VITAMINB12, FOLATE, FERRITIN, TIBC, IRON, RETICCTPCT in the last 72 hours. Urine analysis:    Component Value Date/Time   COLORURINE YELLOW 01/04/2018 [place] 01/04/2018 1639   LABSPEC 1.021 01/04/2018 1639   PHURINE 6.0 01/04/2018 1639   GLUCOSEU NEGATIVE 01/04/2018 1639   GLUCOSEU NEGATIVE 01/08/2017 1119   HGBUR NEGATIVE 01/04/2018 1639   BILIRUBINUR NEGATIVE 01/04/2018 1639   KETONESUR 5 (A) 01/04/2018 1639   PROTEINUR NEGATIVE 01/04/2018 1639   UROBILINOGEN  0.2 01/08/2017 1119   NITRITE NEGATIVE 01/04/2018 1639   LEUKOCYTESUR NEGATIVE 01/04/2018 1639   Sepsis Labs: @LABRCNTIP (procalcitonin:4,lacticidven:4)  ) Recent Results (from the past 240 hour(s))  Culture, blood (Routine X 2) w Reflex to ID Panel     Status: None (Preliminary result)   Collection Time: 01/04/18  3:35 PM  Result Value Ref Range Status   Specimen Description   Final    BLOOD RIGHT FOREARM Performed at [facility], [place] [address], [place], [place] [address]    Special Requests   Final    BOTTLES DRAWN AEROBIC AND ANAEROBIC Blood Culture results may not be optimal due to an excessive volume of blood received in culture bottles Performed at [facility] [address] [place], [place] [address]    Culture   Final    NO GROWTH < 24 HOURS Performed at [facility] [address], [place], [place] [address]  Report Status PENDING  Incomplete  MRSA PCR Screening     Status: None   Collection Time: 01/04/18  9:49 PM  Result Value Ref Range Status   MRSA by PCR NEGATIVE NEGATIVE Final    Comment:        The GeneXpert MRSA Assay (FDA approved for NASAL specimens only), is one component of a comprehensive MRSA colonization surveillance program. It is not intended to diagnose MRSA infection nor to guide or monitor treatment for MRSA infections. Performed at [facility], [place] [address], [place], [place] [address]   Culture, blood (Routine X 2) w Reflex to ID Panel     Status: None (Preliminary result)   Collection Time: 01/04/18 10:21 PM  Result Value Ref Range Status   Specimen Description   Final    BLOOD LEFT ANTECUBITAL Performed at [facility] [address], [place], [place] [address]    Special Requests   Final    BOTTLES DRAWN AEROBIC AND ANAEROBIC Blood Culture adequate volume Performed at [facility] [address], [place], [place]  [address]    Culture   Final    NO GROWTH < 12 HOURS Performed at [facility] [address], [place],  [address]    Report Status PENDING  Incomplete      Radiology Studies: Dg Chest 2 View  Result Date: 01/04/2018 CLINICAL DATA:  Cough cough EXAM: CHEST - 2 VIEW COMPARISON:  12/21/2017, 01/01/2017 FINDINGS: Coarse interstitial opacity and reticular changes consistent with pulmonary fibrosis, similar as compared with prior exams. No pleural effusion or acute focal airspace disease. Post sternotomy changes. Stable cardiomediastinal silhouette. No pneumothorax. IMPRESSION: Chronic fibrosis without definite acute focal airspace disease. Electronically Signed   By: Donavan Foil M.D.   On: 01/04/2018 17:01     Scheduled Meds: . aspirin EC  81 mg Oral Daily  . atorvastatin  20 mg Oral q1800  . busPIRone  5 mg Oral BID  . carbidopa-levodopa  1 tablet Oral QHS  . carbidopa-levodopa  2 tablet Oral TID AC  . enoxaparin (LOVENOX) injection  40 mg Subcutaneous Q24H  . finasteride  5 mg Oral QPM  . gabapentin  100 mg Oral 2 times per day  . gabapentin  400 mg Oral QHS  . ipratropium  0.5 mg Nebulization TID  . lactose free nutrition  237 mL Oral TID WC  . levalbuterol  0.63 mg Nebulization Q6H  . levothyroxine  150 mcg Oral Q0600  . mouth rinse  15 mL Mouth Rinse BID  . methylPREDNISolone (SOLU-MEDROL) injection  60 mg Intravenous Daily  . mirabegron ER  50 mg Oral Daily  . multivitamin with minerals  1 tablet Oral Q1200  . traZODone  100 mg Oral QHS   Continuous Infusions: . sodium chloride 75 mL/hr at 01/05/18 0700  . ceFEPime (MAXIPIME) IV Stopped (01/05/18 0445)     LOS: 0 days   Time Spent in minutes   45 minutes (greater than 50% of time spent with patient face to face, as well as reviewing records, and formulating a plan)  Cristal Ford D.O. on 01/05/2018 at 11:09 AM  Between 7am to 7pm - Please see pager noted on [email or web address]  After 7pm go to  [email or web address]  And look for the night coverage person covering for me after hours  [facility] Office  [phone]

## 2018-01-05 NOTE — Evaluation (Signed)
Physical Therapy Evaluation Patient Details Name: Bradley Bennett MRN: [id number] DOB: 04-13-1932 Today's Date: 01/05/2018   History of Present Illness  Bradley Bennett is a 84 y.o. male with medical history significant of COPD, centrilobular emphysema, pulmonary fibrosis, CAD status post CABG and PCI, hypertension, hyperlipidemia, Parkinson's disease presenting to the hospital for evaluation of shortness of breath  Clinical Impression  Patient presents with decreased mobility due to decreased activity tolerance and will benefit from skilled PT in the acute setting to allow return home with intermittent family/friend support.  Likely could benefit from some further pulmonary rehab if able to tolerate once over this acute illness, but no current recommendations for follow up PT after d/c. SpO2 dropped during ambulation on 15 L portable O2 to 74%, up to 90% with 2 minute rest period on wall O2 (high flow 8L).     Follow Up Recommendations No PT follow up    Equipment Recommendations  None recommended by PT    Recommendations for Other Services       Precautions / Restrictions Precautions Precautions: Fall Precaution Comments: oxygen dependent      Mobility  Bed Mobility Overal bed mobility: Modified Independent                Transfers Overall transfer level: Needs assistance   Transfers: Sit to/from Stand Sit to Stand: Supervision         General transfer comment: due to lines  Ambulation/Gait Ambulation/Gait assistance: Min guard;Supervision Gait Distance (Feet): 200 Feet Assistive device: Rolling walker (2 wheeled) Gait Pattern/deviations: Step-through pattern;Decreased stride length     General Gait Details: used walker due to multiple lines, on 15 L portable O2 throughout SpO2 down to 74% after ambulation back up to 90% in 2 minutes on high flow O2 in room 8LPM  Stairs            Wheelchair Mobility    Modified Rankin (Stroke Patients Only)        Balance Overall balance assessment: No apparent balance deficits (not formally assessed)                                           Pertinent Vitals/Pain Pain Assessment: No/denies pain    Home Living Family/patient expects to be discharged to:: Private residence Living Arrangements: Alone Available Help at Discharge: Available PRN/intermittently;Friend(s) Type of Home: Independent living facility([facility]) Home Access: Elevator     Home Layout: One level Home Equipment: Walker - 4 wheels;Grab bars - tub/shower;Shower seat - built in;Grab bars - toilet      Prior Function Level of Independence: Independent         Comments: has help for laundry, cleaning and meals, still drives     Hand Dominance   Dominant Hand: Right    Extremity/Trunk Assessment        Lower Extremity Assessment Lower Extremity Assessment: Overall WFL for tasks assessed       Communication   Communication: HOH  Cognition Arousal/Alertness: Awake/alert Behavior During Therapy: WFL for tasks assessed/performed Overall Cognitive Status: Within Functional Limits for tasks assessed                                        General Comments General comments (skin integrity, edema, etc.): daughter arrived during  session    Exercises     Assessment/Plan    PT Assessment Patient needs continued PT services  PT Problem List Decreased activity tolerance;Decreased mobility;Cardiopulmonary status limiting activity       PT Treatment Interventions DME instruction;Patient/family education;Gait training;Functional mobility training    PT Goals (Current goals can be found in the Care Plan section)  Acute Rehab PT Goals Patient Stated Goal: to go home PT Goal Formulation: With patient Time For Goal Achievement: 01/11/18 Potential to Achieve Goals: Good    Frequency Min 3X/week   Barriers to discharge        Co-evaluation                AM-PAC PT "6 Clicks" Mobility  Outcome Measure Help needed turning from your back to your side while in a flat bed without using bedrails?: None Help needed moving from lying on your back to sitting on the side of a flat bed without using bedrails?: A Little Help needed moving to and from a bed to a chair (including a wheelchair)?: A Little Help needed standing up from a chair using your arms (e.g., wheelchair or bedside chair)?: A Little Help needed to walk in hospital room?: A Little Help needed climbing 3-5 steps with a railing? : A Lot 6 Click Score: 18    End of Session Equipment Utilized During Treatment: Gait belt;Oxygen Activity Tolerance: Treatment limited secondary to medical complications (Comment)(hypoxia ambulating on 15L portable O2) Patient left: with call bell/phone within reach;in chair;with family/visitor present   PT Visit Diagnosis: Difficulty in walking, not elsewhere classified (R26.2)    Time: 1140-1210 PT Time Calculation (min) (ACUTE ONLY): 30 min   Charges:   PT Evaluation $PT Eval Low Complexity: [place], [place] Acute Rehabilitation Services [phone] 01/05/2018   Reginia Naas 01/05/2018, 12:20 PM

## 2018-01-06 DIAGNOSIS — R9431 Abnormal electrocardiogram [ECG] [EKG]: Secondary | ICD-10-CM

## 2018-01-06 LAB — BASIC METABOLIC PANEL
Anion gap: 6 (ref 5–15)
BUN: 14 mg/dL (ref 8–23)
CO2: 28 mmol/L (ref 22–32)
CREATININE: 0.67 mg/dL (ref 0.61–1.24)
Calcium: 8.2 mg/dL — ABNORMAL LOW (ref 8.9–10.3)
Chloride: 105 mmol/L (ref 98–111)
GFR calc Af Amer: >60 mL/min (ref >60)
GFR calc non Af Amer: >60 mL/min (ref >60)
Glucose, Bld: 105 mg/dL — ABNORMAL HIGH (ref 70–99)
Potassium: 3.6 mmol/L (ref 3.5–5.1)
Sodium: 139 mmol/L (ref 135–145)

## 2018-01-06 LAB — MAGNESIUM: Magnesium: 1.8 mg/dL (ref 1.7–2.4)

## 2018-01-06 LAB — LACTIC ACID, PLASMA: Lactic Acid, Venous: 3.1 mmol/L (ref 0.5–1.9)

## 2018-01-06 MED ORDER — SODIUM CHLORIDE 0.9 % IV SOLN
INTRAVENOUS | Status: DC | PRN
  Administered 2018-01-06: 1000 mL via INTRAVENOUS

## 2018-01-06 MED ORDER — MAGNESIUM SULFATE 2 GM/50ML IV SOLN
2.0000 g | Freq: Once | INTRAVENOUS | Status: AC
  Administered 2018-01-06: 2 g via INTRAVENOUS
  Filled 2018-01-06: qty 50

## 2018-01-06 MED ORDER — POTASSIUM CHLORIDE CRYS ER 20 MEQ PO TBCR
20.0000 meq | EXTENDED_RELEASE_TABLET | ORAL | Status: AC
  Administered 2018-01-06 – 2018-01-07 (×2): 20 meq via ORAL
  Filled 2018-01-06 (×2): qty 1

## 2018-01-06 NOTE — Progress Notes (Signed)
eLink Physician-Brief Progress Note Patient Name: Bradley Bennett DOB: 1932-07-31 MRN: [id number]   Date of Service  01/06/2018  HPI/Events of Note  Wide-complex pattern on monitoring lead not reflected on 12 lead EKG and likely artifactual related to patient's known tremors.  eICU Interventions  No indication for antiarrythmic rx. Replete K+ and Mg.        Frederik Pear 01/06/2018, 8:43 PM

## 2018-01-06 NOTE — Evaluation (Signed)
Occupational Therapy Evaluation Patient Details Name: Bradley Bennett MRN: [id number] DOB: 10-14-1932 Today's Date: 01/06/2018    History of Present Illness Bradley Bennett was admitted with SOB/acute respiratory failure.   XIH:WTUU, centrilobular emphysema, pulmonary fibrosis, CAD status post CABG and PCI, hypertension, hyperlipidemia, Parkinson's disease   Clinical Impression   This 54 year ol man was admitted for the above. He lives at [facility], does basic adls, drives and uses scooter to get to dining room with 02.  He has assist for IADLs.  Pt is limited by cardiopulmonary status. Will benefit from continued OT in acute setting with mod I level goals.    Follow Up Recommendations  No OT follow up(as long as cardiopulmonary status improves to reach mod I level)    Equipment Recommendations       Recommendations for Other Services       Precautions / Restrictions Precautions Precautions: Fall Precaution Comments: oxygen dependent Restrictions Weight Bearing Restrictions: No      Mobility Bed Mobility Overal bed mobility: Modified Independent             General bed mobility comments: HOB raised  Transfers       Sit to Stand: Supervision         General transfer comment: with and without RW    Balance Overall balance assessment: No apparent balance deficits (not formally assessed)                                         ADL either performed or assessed with clinical judgement   ADL Overall ADL's : Needs assistance/impaired     Grooming: Oral care;Set up;Sitting   Upper Body Bathing: Set up   Lower Body Bathing: Minimal assistance;Sit to/from stand   Upper Body Dressing : Set up   Lower Body Dressing: Minimal assistance;Sit to/from stand   Toilet Transfer: Supervision/safety;Stand-pivot;BSC;RW   Toileting- Water quality scientist and Hygiene: Minimal assistance;Sit to/from stand         General  ADL Comments: pt needs min A for adls due to cardiopulmonary status. On 15 ltrs of 02 during evaluation.     Vision         Perception     Praxis      Pertinent Vitals/Pain Pain Assessment: No/denies pain     Hand Dominance Right   Extremity/Trunk Assessment Upper Extremity Assessment Upper Extremity Assessment: Overall WFL for tasks assessed           Communication Communication Communication: HOH   Cognition Arousal/Alertness: Awake/alert Behavior During Therapy: WFL for tasks assessed/performed Overall Cognitive Status: Within Functional Limits for tasks assessed                                     General Comments  on 15 ltrs during 02 eval. Sats 84% to 95%    Exercises     Shoulder Instructions      Home Living Family/patient expects to be discharged to:: ([facility]) Living Arrangements: Alone     Home Access: Research scientist (medical) Shower/Tub: Walk-in Corporate treasurer Toilet: Handicapped height     Home Equipment: Environmental consultant - 4 wheels;Grab bars - tub/shower;Shower seat - built in;Grab bars - toilet;Hotel manager  Comments: uses scooter to get to dining room      Prior Functioning/Environment          Comments: help for housework; does basic adls and still drives        OT Problem List: Decreased activity tolerance;Cardiopulmonary status limiting activity;Decreased knowledge of use of DME or AE      OT Treatment/Interventions: Self-care/ADL training;Energy conservation;DME and/or AE instruction;Patient/family education;Balance training;Therapeutic activities    OT Goals(Current goals can be found in the care plan section) Acute Rehab OT Goals Patient Stated Goal: to go home OT Goal Formulation: With patient Time For Goal Achievement: 01/20/18 Potential to Achieve Goals: Good ADL Goals Pt Will Transfer to Toilet: with modified independence;ambulating(high) Pt Will Perform  Tub/Shower Transfer: Shower transfer;with modified independence;shower seat Additional ADL Goal #1: pt will gather clothes and perform adl at mod I level, initiating rest breaks as needed for energy conservation  OT Frequency: Min 2X/week   Barriers to D/C:            Co-evaluation              AM-PAC OT "6 Clicks" Daily Activity     Outcome Measure Help from another person eating meals?: None Help from another person taking care of personal grooming?: A Little Help from another person toileting, which includes using toliet, bedpan, or urinal?: A Little Help from another person bathing (including washing, rinsing, drying)?: A Little Help from another person to put on and taking off regular upper body clothing?: A Little Help from another person to put on and taking off regular lower body clothing?: A Little 6 Click Score: 19   End of Session Nurse Communication: Mobility status  Activity Tolerance: (frequent rests due to 02 sats/WOB) Patient left: in chair;with call bell/phone within reach  OT Visit Diagnosis: Muscle weakness (generalized) (M62.81)                Time: 4132-4401 OT Time Calculation (min): 37 min Charges:  OT General Charges $OT Visit: 1 Visit OT Evaluation $OT Eval Low Complexity: 1 Low OT Treatments $Self Care/Home Management : 8-22 mins  Bradley Bennett, OTR/L Acute Rehabilitation Services [phone] [facility] pager [phone] office 01/06/2018  Bradley Bennett 01/06/2018, 11:44 AM

## 2018-01-06 NOTE — Consult Note (Addendum)
Cardiology Consultation:   Patient ID: Bradley Bennett MRN: [id number]; DOB: 26-May-1932  Admit date: 01/04/2018 Date of Consult: 01/06/2018  Primary Care Provider: [name], MD Primary Cardiologist: No primary care provider on file.  Primary Electrophysiologist:  None    Patient Profile:   Bradley Bennett is a 84 y.o. male with a hx of CABG who is being seen today for the evaluation of abnormal telemetry, concern for atrial fibrillation versus ventricular fibrillation/ventricular tachycardia at the request of Dr Ree Kida.  History of Present Illness:   Bradley Bennett 84 year old male with Parkinson's disease here with acute COPD exacerbation.  There was concern about possible V. tach/V. fib/possible atrial fibrillation on telemetry.  Upon close inspection-this is artifact.  Please see below for details.  No loss of consciousness, no chest pain.  Increased cough.  Past Medical History:  Diagnosis Date  . Arthritis   . Asthma   . Coronary artery disease    ASCAD with MI in 1992 and subsequently underwent 4 vessel CABG in [place].  Since then he has had several PCIs with the last one being 2015.    . Emphysema of lung ([place])   . Hearing difficulty of both ears   . Hyperlipidemia   . Hypertension   . ILD (interstitial lung disease) ([place])   . Parkinson disease [facility])     Past Surgical History:  Procedure Laterality Date  . CARPAL TUNNEL RELEASE Right 03/2014  . CORONARY ANGIOPLASTY WITH STENT PLACEMENT    . CORONARY ARTERY BYPASS GRAFT  1992  . HERNIA REPAIR  10/2013   x3   . VEIN BYPASS SURGERY       Home Medications:  Prior to Admission medications   Medication Sig Start Date End Date Taking? Authorizing Provider  acetaminophen (TYLENOL) 500 MG tablet Take 500-1,000 mg by mouth every 6 (six) hours as needed for mild pain, moderate pain, fever or headache.    Yes [provider]  aspirin EC 81 MG tablet Take 1 tablet (81 mg total) by mouth daily. 03/06/17  03/06/18 Yes Turner, Eber Hong, MD  atorvastatin (LIPITOR) 20 MG tablet Take 1 tablet (20 mg total) by mouth daily at 6 PM. 12/03/17 06/01/18 Yes Turner, Eber Hong, MD  busPIRone (BUSPAR) 5 MG tablet Take 1 tablet (5 mg total) by mouth 2 (two) times daily. 10/29/17  Yes Burns, [name], MD  carbidopa-levodopa (SINEMET CR) 50-200 MG tablet Take 1 tablet by mouth at bedtime. 12/28/17  Yes Tat, Eustace Quail, DO  carbidopa-levodopa (SINEMET IR) 25-100 MG tablet Take 2 tablets by mouth 3 (three) times daily. 12/28/17  Yes Tat, Eustace Quail, DO  finasteride (PROSCAR) 5 MG tablet Take 1 tablet (5 mg total) by mouth every evening. 05/12/16  Yes Ollis, Brandi L, NP  gabapentin (NEURONTIN) 100 MG capsule TAKE ONE CAPSULE BY MOUTH AT 8AM AND ONE CAPSULE AT 4PM Patient taking differently: Take 100 mg by mouth 2 (two) times daily. TAKE ONE CAPSULE BY MOUTH AT 8AM AND ONE CAPSULE AT 4PM 10/29/17  Yes Burns, [name], MD  gabapentin (NEURONTIN) 400 MG capsule TAKE ONE CAPSULE BY MOUTH AT BEDTIME 10/29/17  Yes Burns, [name], MD  Ibuprofen-diphenhydrAMINE HCl (ADVIL PM) 200-25 MG CAPS Take 2 tablets by mouth at bedtime as needed (sleep).   Yes [provider]  lactose free nutrition (BOOST PLUS) LIQD Take 237 mLs by mouth 3 (three) times daily with meals. 10/24/16  Yes Sheikh, Omair Latif, DO  levothyroxine (SYNTHROID, LEVOTHROID) 150 MCG  tablet TAKE ONE TABLET BY MOUTH DAILY 08/22/17  Yes Burns, [name], MD  mirabegron ER (MYRBETRIQ) 50 MG TB24 tablet Take 50 mg by mouth daily.   Yes [provider]  Multiple Vitamin (MULTIVITAMIN WITH MINERALS) TABS tablet Take 1 tablet by mouth daily at 12 noon.   Yes [provider]  Multiple Vitamins-Minerals (ICAPS AREDS 2 PO) Take 1-2 tablets by mouth See admin instructions. 2 tablets @ noon and 1 tablet @dinner    Yes [provider]  Tiotropium Bromide-Olodaterol (STIOLTO RESPIMAT) 2.5-2.5 MCG/ACT AERS Inhale 2 puffs into the lungs daily. 01/04/18  Yes Juanito Doom, MD  traZODone (DESYREL) 100 MG tablet TAKE ONE TABLET BY MOUTH AT BEDTIME Patient taking differently: Take 100 mg by mouth at bedtime.  11/05/17  Yes Juanito Doom, MD  trimethoprim (TRIMPEX) 100 MG tablet Take 100 mg by mouth at bedtime.  01/25/17  Yes [provider]  sodium chloride (OCEAN) 0.65 % SOLN nasal spray Place 1 spray into both nostrils as needed for congestion. Patient not taking: Reported on 01/04/2018 05/12/16   Donita Brooks, NP  Spacer/Aero-Holding Chambers (AEROCHAMBER MV) inhaler Use as instructed 12/21/17   Lauraine Rinne, NP    Inpatient Medications: Scheduled Meds: . aspirin EC  81 mg Oral Daily  . atorvastatin  20 mg Oral q1800  . busPIRone  5 mg Oral BID  . carbidopa-levodopa  1 tablet Oral QHS  . carbidopa-levodopa  2 tablet Oral TID AC  . enoxaparin (LOVENOX) injection  40 mg Subcutaneous Q24H  . finasteride  5 mg Oral QPM  . gabapentin  100 mg Oral 2 times per day  . gabapentin  400 mg Oral QHS  . ipratropium  0.5 mg Nebulization TID  . lactose free nutrition  237 mL Oral TID WC  . levalbuterol  0.63 mg Nebulization TID  . levothyroxine  150 mcg Oral Q0600  . mouth rinse  15 mL Mouth Rinse BID  . methylPREDNISolone (SOLU-MEDROL) injection  60 mg Intravenous Daily  . mirabegron ER  50 mg Oral Daily  . multivitamin with minerals  1 tablet Oral Q1200  . traZODone  100 mg Oral QHS   Continuous Infusions: . sodium chloride 10 mL/hr at 01/06/18 0831  . ceFEPime (MAXIPIME) IV Stopped (01/06/18 0414)   PRN Meds: sodium chloride, acetaminophen  Allergies:    Allergies  Allergen Reactions  . Isosorbide Other (See Comments)    Reaction:  Headaches     Social History:   Social History   Socioeconomic History  . Marital status: Divorced    Spouse name: Not on file  . Number of children: Not on file  . Years of education: Not on file  . Highest education level: Not on file  Occupational History  . Occupation: retired     Comment: [employer]  . Financial resource strain: Not on file  . Food insecurity:    Worry: Not on file    Inability: Not on file  . Transportation needs:    Medical: Not on file    Non-medical: Not on file  Tobacco Use  . Smoking status: Former Smoker    Packs/day: 2.00    Years: 35.00    Pack years: 70.00    Last attempt to quit: 08/15/1985    Years since quitting: 32.4  . Smokeless tobacco: Never Used  . Tobacco comment: quit smoking in 1992  Substance and Sexual Activity  . Alcohol use: Yes  Alcohol/week: 0.0 standard drinks    Comment: twice every 6 months  . Drug use: No  . Sexual activity: Never  Lifestyle  . Physical activity:    Days per week: Not on file    Minutes per session: Not on file  . Stress: Not on file  Relationships  . Social connections:    Talks on phone: Not on file    Gets together: Not on file    Attends religious service: Not on file    Active member of club or organization: Not on file    Attends meetings of clubs or organizations: Not on file    Relationship status: Not on file  . Intimate partner violence:    Fear of current or ex partner: Not on file    Emotionally abused: Not on file    Physically abused: Not on file    Forced sexual activity: Not on file  Other Topics Concern  . Not on file  Social History Narrative  . Not on file    Family History:    Family History  Problem Relation Age of Onset  . Colon cancer Father   . Stroke Father   . Arthritis Mother   . Arthritis Sister   . Heart disease Brother   . Kidney cancer Brother      ROS:  Please see the history of present illness.   All other ROS reviewed and negative.     Physical Exam/Data:   Vitals:   01/06/18 0743 01/06/18 0800 01/06/18 0825 01/06/18 0900  BP:  114/70    Pulse:  65 76 (!) 59  Resp:  16 18 16   Temp:      TempSrc:      SpO2: 96% 95% (!) 85% 97%  Weight:      Height:        Intake/Output Summary (Last 24 hours) at 01/06/2018  1005 Last data filed at 01/06/2018 0900 Gross per 24 hour  Intake 1810.15 ml  Output 2000 ml  Net -189.85 ml   Filed Weights   01/04/18 2200  Weight: 81.2 kg   Body mass index is 28.04 kg/m.  General:  Well nourished, well developed, in no acute distress, elderly HEENT: normal Lymph: no adenopathy Neck: no JVD Endocrine:  No thryomegaly Vascular: No carotid bruits; FA pulses 2+ bilaterally without bruits  Cardiac:  normal S1, S2; RRR; no murmur, occasional ectopy Lungs: Mild wheezes heard bilaterally Abd: soft, nontender, no hepatomegaly  Ext: no edema Musculoskeletal:  No deformities, BUE and BLE strength normal and equal Skin: warm and dry  Neuro:  CNs 2-12 intact, no focal abnormalities noted Psych:  Normal affect   EKG:  The EKG was personally reviewed and demonstrates: Normal sinus rhythm 89 with no other significant abnormalities  Telemetry:  Telemetry was personally reviewed and demonstrates: Sinus rhythm.  There are multiple episodes in telemetry that are labeled as ventricular tachycardia that are artifactual based upon his parkinsonian tremor.  Upon careful inspection, QRS complexes are marching through at normal sinus rhythm pace with artifactual waveform that resembles torsades representing his parkinsonian tremor.  This is not ventricular tachycardia or atrial fibrillation.  Relevant CV Studies:  2018 echocardiogram-EF 65% with pulmonary pressures 56. Laboratory Data:  Chemistry Recent Labs  Lab 01/04/18 1523 01/05/18 0756  NA 140 142  K 4.1 4.6  CL 106 112*  CO2 25 23  GLUCOSE 146* 109*  BUN 22 15  CREATININE 0.95 0.58*  CALCIUM 8.7* 8.1*  GFRNONAA >  60 >60  GFRAA >60 >60  ANIONGAP 9 7    Recent Labs  Lab 01/04/18 1523  PROT 6.5  ALBUMIN 3.5  AST 26  ALT 10  ALKPHOS 73  BILITOT 0.9   Hematology Recent Labs  Lab 01/04/18 1523  WBC 8.9  RBC 4.79  HGB 14.6  HCT 46.7  MCV 97.5  MCH 30.5  MCHC 31.3  RDW 14.3  PLT 263   Cardiac  Enzymes Recent Labs  Lab 01/04/18 1523  TROPONINI <0.03   No results for input(s): TROPIPOC in the last 168 hours.  BNPNo results for input(s): BNP, PROBNP in the last 168 hours.  DDimer No results for input(s): DDIMER in the last 168 hours.  Radiology/Studies:  Dg Chest 2 View  Result Date: 01/04/2018 CLINICAL DATA:  Cough cough EXAM: CHEST - 2 VIEW COMPARISON:  12/21/2017, 01/01/2017 FINDINGS: Coarse interstitial opacity and reticular changes consistent with pulmonary fibrosis, similar as compared with prior exams. No pleural effusion or acute focal airspace disease. Post sternotomy changes. Stable cardiomediastinal silhouette. No pneumothorax. IMPRESSION: Chronic fibrosis without definite acute focal airspace disease. Electronically Signed   By: Donavan Foil M.D.   On: 01/04/2018 17:01    Assessment and Plan:   Abnormal telemetry-artifact that is concerning for ventricular fibrillation -Sinus rhythm.  There are multiple episodes in telemetry that are labeled as ventricular tachycardia that are artifactual based upon his parkinsonian tremor.  Upon careful inspection, QRS complexes are marching through at normal sinus rhythm pace with artifactual waveform that resembles torsades representing his parkinsonian tremor.  This is not ventricular tachycardia or atrial fibrillation. This is artifact. Discussed with nursing team at telemetry monitor.  -Previous EF normal.  Parkinson's disease - Causing occasional tremor which is causing artifact on telemetry.  CAD -Prior coronary artery disease bypass grafts.  Stable, no anginal symptoms.  Secondary pulmonary hypertension -A result of ongoing COPD.  COPD exacerbation -Treatment per primary team.  Please let us know if we can be of further assistance  [facility] will sign off.   Medication Recommendations:  none Other recommendations (labs, testing, etc):  none Follow up as an outpatient:  none      For questions or updates,  please contact [facility] Please consult [email or web address] for contact info under     Signed, Candee Furbish, MD  01/06/2018 10:05 AM

## 2018-01-06 NOTE — Progress Notes (Signed)
PROGRESS NOTE    Bradley Bennett  KDX:[id number] DOB: 10-24-1932 DOA: 01/04/2018 PCP: [name], MD   Brief Narrative:  HPI On 01/04/2018 by Dr. Shela Leff Bradley Bennett is a 84 y.o. male with medical history significant of COPD, centrilobular emphysema, pulmonary fibrosis, CAD status post CABG and PCI, hypertension, hyperlipidemia presenting to the hospital for evaluation of shortness of breath.  Patient reports having chronic dyspnea which has been worse recently.  States at baseline he uses 10 L oxygen at rest and 15 L with exertion.  For the past few days he has required 12 L oxygen at rest.  He has been coughing more and the cough has been productive of more sputum.  Denies having any fevers, chills, or chest pain.  He has been compliant with his home inhaler regimen.  Interim histor Admitted for acute on chronic hypoxemic respiratory failure and COPD exacerbation. Assessment & Plan   Acute on chronic hypoxic respiratory failure secondary to COPD exacerbation with underlying emphysema and pulmonary fibrosis -Patient uses 2 L of home oxygen at rest and 15 L with exertion -Currently afebrile, no leukocytosis -CXR reviewed, showing chronic fribrosis, no infection -blood cultures show no growth -initially required nonrebreather  -Currently on nasal canula, 12L; patient ambulated yesterday on 15L and O2 saturations dropped to 74% -Continue Solu-Medrol, neb treatments, cefepime -Will consult palliative care to discuss GOC and possible hospice -?natural progression of pulm fibrosis   Nonsustained V fib/VTach -Patient had nonsustained run of V. Fib -K 4.6, Magnesium 1.9 -Currently asymptomatic -Continues to have runs of VT this morning- asymptomatic. Will discuss with cardiology  Elevated lactic acidosis -Lactic acid peaked at 5.6, now back down to 2.2- pending lab this morning -Suspect worsened by patient's respiratory issues -Admitting physician discussed with Dr.  dictating, PCCM, recommended IV fluid resuscitation -Procalcitonin <0.1  Physical deconditioning -PT evaluated patient, no follow up needed -OT pending   CAD -H/o of CABG and PCI -Troponin unremarkable, EKG not suggestive of ACS -No complaints of chest pain -Continue aspirin, statin  Hypothyroidism -TSH 0.528 -Continue Synthroid  Hyperlipidemia -Continue statin  Depression -Continue buspirone and trazodone  Parkinson's disease -Continue Sinemet  BPH -Continue finasteride  Chronic back pain -Continue gabapentin, Tylenol  Overactive bladder -Continue mirabegron   DVT Prophylaxis  lovenox  Code Status: DNR  Family Communication: None at bedside  Disposition Plan: Admitted. Pending improvement in respiratory status as well as cardiology input. Palliative care also consulted.  Consultants Palliative care Cardiology  Procedures  None  Antibiotics   Anti-infectives (From admission, onward)   Start     Dose/Rate Route Frequency Ordered Stop   01/04/18 2000  ceFEPIme (MAXIPIME) 1 g in sodium chloride 0.9 % 100 mL IVPB     1 g 200 mL/hr over 30 Minutes Intravenous Every 8 hours 01/04/18 1908 01/09/18 1959      Subjective:   [name] seen and examined today.  Patient feels his breathing has improved although was not back to his baseline.  He feels that this may just be a progression of his disease.  He denies any current chest pain, abdominal pain, nausea or vomiting, diarrhea or constipation, dizziness or headache.  Objective:   Vitals:   01/06/18 0343 01/06/18 0400 01/06/18 0743 01/06/18 0800  BP:  (!) 155/84  114/70  Pulse:  63  65  Resp:  17  16  Temp: 97.7 F (36.5 C)     TempSrc: Oral     SpO2:  96% 96%  95%  Weight:      Height:        Intake/Output Summary (Last 24 hours) at 01/06/2018 0947 Last data filed at 01/06/2018 0555 Gross per 24 hour  Intake 1687.92 ml  Output 2000 ml  Net -312.08 ml   Filed Weights   01/04/18 2200    Weight: 81.2 kg   Exam  General: Well developed, elderly, NAD  HEENT: NCAT, mucous membranes moist.   Neck: Supple  Cardiovascular: S1 S2 auscultated, RRR, no murmur  Respiratory: Clear to auscultation bilaterally with equal chest rise  Abdomen: Soft, nontender, nondistended, + bowel sounds  Extremities: warm dry without cyanosis clubbing or edema  Neuro: AAOx3, hard of hearing, otherwise nonfocal  Psych: Normal affect and demeanor with intact judgement and insight, pleasant  Data Reviewed: I have personally reviewed following labs and imaging studies  CBC: Recent Labs  Lab 01/04/18 1523  WBC 8.9  NEUTROABS 6.6  HGB 14.6  HCT 46.7  MCV 97.5  PLT 992   Basic Metabolic Panel: Recent Labs  Lab 01/04/18 1523 01/04/18 2221 01/05/18 0756  NA 140  --  142  K 4.1  --  4.6  CL 106  --  112*  CO2 25  --  23  GLUCOSE 146*  --  109*  BUN 22  --  15  CREATININE 0.95  --  0.58*  CALCIUM 8.7*  --  8.1*  MG  --  1.9 1.9   GFR: Estimated Creatinine Clearance: 71.3 mL/min (A) (by C-G formula based on SCr of 0.58 mg/dL (L)). Liver Function Tests: Recent Labs  Lab 01/04/18 1523  AST 26  ALT 10  ALKPHOS 73  BILITOT 0.9  PROT 6.5  ALBUMIN 3.5   No results for input(s): LIPASE, AMYLASE in the last 168 hours. No results for input(s): AMMONIA in the last 168 hours. Coagulation Profile: No results for input(s): INR, PROTIME in the last 168 hours. Cardiac Enzymes: Recent Labs  Lab 01/04/18 1523  TROPONINI <0.03   BNP (last 3 results) No results for input(s): PROBNP in the last 8760 hours. HbA1C: No results for input(s): HGBA1C in the last 72 hours. CBG: No results for input(s): GLUCAP in the last 168 hours. Lipid Profile: No results for input(s): CHOL, HDL, LDLCALC, TRIG, CHOLHDL, LDLDIRECT in the last 72 hours. Thyroid Function Tests: Recent Labs    01/05/18 0140  TSH 0.528   Anemia Panel: No results for input(s): VITAMINB12, FOLATE, FERRITIN, TIBC,  IRON, RETICCTPCT in the last 72 hours. Urine analysis:    Component Value Date/Time   COLORURINE YELLOW 01/04/2018 [place] 01/04/2018 1639   LABSPEC 1.021 01/04/2018 1639   PHURINE 6.0 01/04/2018 1639   GLUCOSEU NEGATIVE 01/04/2018 1639   GLUCOSEU NEGATIVE 01/08/2017 1119   HGBUR NEGATIVE 01/04/2018 1639   BILIRUBINUR NEGATIVE 01/04/2018 1639   KETONESUR 5 (A) 01/04/2018 1639   PROTEINUR NEGATIVE 01/04/2018 1639   UROBILINOGEN 0.2 01/08/2017 1119   NITRITE NEGATIVE 01/04/2018 1639   LEUKOCYTESUR NEGATIVE 01/04/2018 1639   Sepsis Labs: @LABRCNTIP (procalcitonin:4,lacticidven:4)  ) Recent Results (from the past 240 hour(s))  Culture, blood (Routine X 2) w Reflex to ID Panel     Status: None (Preliminary result)   Collection Time: 01/04/18  3:35 PM  Result Value Ref Range Status   Specimen Description   Final    BLOOD RIGHT FOREARM Performed at [facility], [place] [address], [place], [place] [address]    Special Requests   Final  BOTTLES DRAWN AEROBIC AND ANAEROBIC Blood Culture results may not be optimal due to an excessive volume of blood received in culture bottles Performed at [facility], [place] [address], [place], [place] [address]    Culture   Final    NO GROWTH < 24 HOURS Performed at [place] [address], [place], [place] [address]    Report Status PENDING  Incomplete  MRSA PCR Screening     Status: None   Collection Time: 01/04/18  9:49 PM  Result Value Ref Range Status   MRSA by PCR NEGATIVE NEGATIVE Final    Comment:        The GeneXpert MRSA Assay (FDA approved for NASAL specimens only), is one component of a comprehensive MRSA colonization surveillance program. It is not intended to diagnose MRSA infection nor to guide or monitor treatment for MRSA infections. Performed at [facility], [place] [address], [place], [place] [address]   Culture, blood (Routine X 2) w  Reflex to ID Panel     Status: None (Preliminary result)   Collection Time: 01/04/18 10:21 PM  Result Value Ref Range Status   Specimen Description   Final    BLOOD LEFT ANTECUBITAL Performed at [place] [address], [place], [place] [address]    Special Requests   Final    BOTTLES DRAWN AEROBIC AND ANAEROBIC Blood Culture adequate volume Performed at [place] [address], [place], [place] [address]    Culture   Final    NO GROWTH < 12 HOURS Performed at [place] [address], [place],  [address]    Report Status PENDING  Incomplete      Radiology Studies: Dg Chest 2 View  Result Date: 01/04/2018 CLINICAL DATA:  Cough cough EXAM: CHEST - 2 VIEW COMPARISON:  12/21/2017, 01/01/2017 FINDINGS: Coarse interstitial opacity and reticular changes consistent with pulmonary fibrosis, similar as compared with prior exams. No pleural effusion or acute focal airspace disease. Post sternotomy changes. Stable cardiomediastinal silhouette. No pneumothorax. IMPRESSION: Chronic fibrosis without definite acute focal airspace disease. Electronically Signed   By: Donavan Foil M.D.   On: 01/04/2018 17:01     Scheduled Meds: . aspirin EC  81 mg Oral Daily  . atorvastatin  20 mg Oral q1800  . busPIRone  5 mg Oral BID  . carbidopa-levodopa  1 tablet Oral QHS  . carbidopa-levodopa  2 tablet Oral TID AC  . enoxaparin (LOVENOX) injection  40 mg Subcutaneous Q24H  . finasteride  5 mg Oral QPM  . gabapentin  100 mg Oral 2 times per day  . gabapentin  400 mg Oral QHS  . ipratropium  0.5 mg Nebulization TID  . lactose free nutrition  237 mL Oral TID WC  . levalbuterol  0.63 mg Nebulization TID  . levothyroxine  150 mcg Oral Q0600  . mouth rinse  15 mL Mouth Rinse BID  . methylPREDNISolone (SOLU-MEDROL) injection  60 mg Intravenous Daily  . mirabegron ER  50 mg Oral Daily  . multivitamin with minerals  1 tablet Oral Q1200  .  traZODone  100 mg Oral QHS   Continuous Infusions: . sodium chloride 10 mL/hr at 01/06/18 0831  . ceFEPime (MAXIPIME) IV Stopped (01/06/18 0414)     LOS: 1 day   Time Spent in minutes   45 minutes (greater than 50% of time spent with patient face to face, as well as reviewing records, calling  consults, and formulating a plan)  Cristal Ford D.O. on 01/06/2018 at 9:47 AM  Between 7am to 7pm - Please see pager noted on [email or web address]  After 7pm go to [email or web address]  And look for the night coverage person covering for me after hours  [facility] Office  [phone]

## 2018-01-06 NOTE — Progress Notes (Signed)
Nutrition Brief Note  RD consulted via COPD gold protocol.  Patient's UBW is 180 lb. Weight is stable. Boost Plus has been ordered for patient.  Wt Readings from Last 15 Encounters:  01/04/18 81.2 kg  12/21/17 80.3 kg  10/05/17 80.6 kg  08/17/17 80.3 kg  08/07/17 79 kg  05/18/17 78.9 kg  03/06/17 82 kg  03/02/17 81.6 kg  02/21/17 81.6 kg  02/16/17 82.1 kg  01/08/17 82.6 kg  01/01/17 82.7 kg  12/28/16 81.7 kg  11/01/16 78 kg  10/30/16 80.7 kg    Body mass index is 28.04 kg/m. Patient meets criteria for overweight based on current BMI.   Current diet order is heart healthy. Labs and medications reviewed.   No nutrition interventions warranted at this time. If nutrition issues arise, please consult RD.   Clayton Bibles, MS, RD, [place] Dietitian Pager: [phone] After Hours Pager: [phone]

## 2018-01-07 DIAGNOSIS — Z7189 Other specified counseling: Secondary | ICD-10-CM

## 2018-01-07 DIAGNOSIS — Z515 Encounter for palliative care: Secondary | ICD-10-CM

## 2018-01-07 LAB — BASIC METABOLIC PANEL
Anion gap: 8 (ref 5–15)
BUN: 16 mg/dL (ref 8–23)
CO2: 30 mmol/L (ref 22–32)
Calcium: 8.5 mg/dL — ABNORMAL LOW (ref 8.9–10.3)
Chloride: 101 mmol/L (ref 98–111)
Creatinine, Ser: 0.68 mg/dL (ref 0.61–1.24)
GFR calc Af Amer: >60 mL/min (ref >60)
GFR calc non Af Amer: >60 mL/min (ref >60)
Glucose, Bld: 87 mg/dL (ref 70–99)
Potassium: 4.3 mmol/L (ref 3.5–5.1)
SODIUM: 139 mmol/L (ref 135–145)

## 2018-01-07 LAB — LACTIC ACID, PLASMA: Lactic Acid, Venous: 0.9 mmol/L (ref 0.5–1.9)

## 2018-01-07 LAB — MAGNESIUM: MAGNESIUM: 2.2 mg/dL (ref 1.7–2.4)

## 2018-01-07 MED ORDER — LEVALBUTEROL HCL 0.63 MG/3ML IN NEBU: 0.6300 mg | INHALATION_SOLUTION | RESPIRATORY_TRACT | Status: DC | PRN

## 2018-01-07 NOTE — Progress Notes (Signed)
Pt walked on 15L approximately 215 feet total; at approximately 109 feet oxygen dropped to 86%. Walked the rest of the way back to bed and while sitting in bed dropped to 70%. Recovered back to 95% within 5 minutes on 15L. Placed on 10L after the 5 minutes and sats at 93%.

## 2018-01-07 NOTE — Telephone Encounter (Signed)
Forwarding to MD for FYI.Marland KitchenJohny Bennett

## 2018-01-07 NOTE — Consult Note (Signed)
Consultation Note Date: 01/07/2018   Patient Name: Bradley Bennett  DOB: May 19, 1932  MRN: [id number]  Age / Sex: 84 y.o., male  PCP: [name], MD Referring Physician: Cristal Ford, DO  Reason for Consultation: Establishing goals of care  HPI/Patient Profile: 84 y.o. male   admitted on 01/04/2018   Clinical Assessment and Goals of Care:  84 year old gentleman who lives in an independent living facility, has history of COPD centrilobular emphysema and pulmonary fibrosis. He has underlying history of coronary artery disease status post percutaneous intervention as well as CABG. Also has underlying hypertension and dyslipidemia.  At baseline patient uses anywhere between 10-15 L of supplemental oxygen via nasal cannula. He has a scooter to help him ambulate at his independent living facility. Otherwise, patient is completely awake alert oriented and able to participate in all of his activities of daily living himself. Patient has been admitted with worsening shortness of breath and has been admitted to hospital medicine for acute on chronic hypoxic respiratory failure secondary to COPD exacerbation with underlying emphysema, pulmonary fibrosis. Hospital course also complicated by questionable asymptomatic arrhythmia events that has been deemed as an aberrancy or at artifact due to his history of Parkinson's disease.  A palliative medicine consultation has been requested for goals of care discussions.  Very pleasant elderly gentleman sitting in a chair. I introduced myself and palliative care as follows: Palliative medicine is specialized medical care for people living with serious illness. It focuses on providing relief from the symptoms and stress of a serious illness. The goal is to improve quality of life for both the patient and the family.  Patient is awake and alert. He denies any symptoms. He  wants to go back to his independent living facility and is asking if he will be released today. He has participated some with physical therapy as well with occupational therapy. He is in no distress. He is currently on 12 liters of oxygen via nasal cannula. Brief life review performed. Patient is now retired, he worked as an Insurance underwriter and has lived in [place] and [place].  Patient wishes to continue to follow-up with his pulmonologist in the outpatient setting. No acute symptoms. Please note additional discussions and summary of recommendations as listed below. Thank you for the consult.  NEXT OF KIN  patient has 3 children. Son lives in [place]. Patient's one daughter lives in [place], [place]. Patient's other daughter lives in [place], [place].  SUMMARY OF RECOMMENDATIONS   Agree with DNR Agree with PT at his independent living facility Would recommend out patient palliative care, at his independent living facility. Patient follows with Dr [place] Bells for his lung disease.  Continue current mode of care.  Thank you for the consult.   Code Status/Advance Care Planning:  DNR    Symptom Management:    continue current mode of care  Palliative Prophylaxis:   Delirium Protocol  Psycho-social/Spiritual:   Desire for further Chaplaincy support:yes  Additional Recommendations: Caregiving  Support/Resources  Prognosis:   Unable to determine  Discharge Planning: recommend outpatient palliative care to follow-up with the patient at his independent living facility.      Primary Diagnoses: Present on Admission: . Hypoxemia . Hypothyroidism . Chronic lower back pain . CAD in native artery . Hyperlipidemia . Parkinson disease ([place])   I have reviewed the medical record, interviewed the patient and family, and examined the patient. The following aspects are pertinent.  Past Medical History:  Diagnosis Date  . Arthritis     . Asthma   . Coronary artery disease    ASCAD with MI in 1992 and subsequently underwent 4 vessel CABG in [place].  Since then he has had several PCIs with the last one being 2015.    . Emphysema of lung ([place])   . Hearing difficulty of both ears   . Hyperlipidemia   . Hypertension   . ILD (interstitial lung disease) ([place])   . Parkinson disease [facility])    Social History   Socioeconomic History  . Marital status: Divorced    Spouse name: Not on file  . Number of children: Not on file  . Years of education: Not on file  . Highest education level: Not on file  Occupational History  . Occupation: retired    Comment: [employer]  . Financial resource strain: Not on file  . Food insecurity:    Worry: Not on file    Inability: Not on file  . Transportation needs:    Medical: Not on file    Non-medical: Not on file  Tobacco Use  . Smoking status: Former Smoker    Packs/day: 2.00    Years: 35.00    Pack years: 70.00    Last attempt to quit: 08/15/1985    Years since quitting: 32.4  . Smokeless tobacco: Never Used  . Tobacco comment: quit smoking in 1992  Substance and Sexual Activity  . Alcohol use: Yes    Alcohol/week: 0.0 standard drinks    Comment: twice every 6 months  . Drug use: No  . Sexual activity: Never  Lifestyle  . Physical activity:    Days per week: Not on file    Minutes per session: Not on file  . Stress: Not on file  Relationships  . Social connections:    Talks on phone: Not on file    Gets together: Not on file    Attends religious service: Not on file    Active member of club or organization: Not on file    Attends meetings of clubs or organizations: Not on file    Relationship status: Not on file  Other Topics Concern  . Not on file  Social History Narrative  . Not on file   Family History  Problem Relation Age of Onset  . Colon cancer Father   . Stroke Father   . Arthritis Mother   . Arthritis Sister   . Heart disease Brother   .  Kidney cancer Brother    Scheduled Meds: . aspirin EC  81 mg Oral Daily  . atorvastatin  20 mg Oral q1800  . busPIRone  5 mg Oral BID  . carbidopa-levodopa  1 tablet Oral QHS  . carbidopa-levodopa  2 tablet Oral TID AC  . enoxaparin (LOVENOX) injection  40 mg Subcutaneous Q24H  . finasteride  5 mg Oral QPM  . gabapentin  100 mg Oral 2 times per day  . gabapentin  400 mg Oral QHS  . ipratropium  0.5 mg Nebulization TID  . lactose free nutrition  237 mL Oral TID WC  . levalbuterol  0.63 mg Nebulization TID  . levothyroxine  150 mcg Oral Q0600  . mouth rinse  15 mL Mouth Rinse BID  . methylPREDNISolone (SOLU-MEDROL) injection  60 mg Intravenous Daily  . mirabegron ER  50 mg Oral Daily  . multivitamin with minerals  1 tablet Oral Q1200  . traZODone  100 mg Oral QHS   Continuous Infusions: . sodium chloride Stopped (01/06/18 1546)  . ceFEPime (MAXIPIME) IV Stopped (01/07/18 1432)   PRN Meds:.sodium chloride, acetaminophen Medications Prior to Admission:  Prior to Admission medications   Medication Sig Start Date End Date Taking? Authorizing Provider  acetaminophen (TYLENOL) 500 MG tablet Take 500-1,000 mg by mouth every 6 (six) hours as needed for mild pain, moderate pain, fever or headache.    Yes [provider]  aspirin EC 81 MG tablet Take 1 tablet (81 mg total) by mouth daily. 03/06/17 03/06/18 Yes Turner, Eber Hong, MD  atorvastatin (LIPITOR) 20 MG tablet Take 1 tablet (20 mg total) by mouth daily at 6 PM. 12/03/17 06/01/18 Yes Turner, Eber Hong, MD  busPIRone (BUSPAR) 5 MG tablet Take 1 tablet (5 mg total) by mouth 2 (two) times daily. 10/29/17  Yes Burns, [name], MD  carbidopa-levodopa (SINEMET CR) 50-200 MG tablet Take 1 tablet by mouth at bedtime. 12/28/17  Yes Tat, Eustace Quail, DO  carbidopa-levodopa (SINEMET IR) 25-100 MG tablet Take 2 tablets by mouth 3 (three) times daily. 12/28/17  Yes Tat, Eustace Quail, DO  finasteride (PROSCAR) 5 MG tablet Take 1 tablet (5 mg total) by  mouth every evening. 05/12/16  Yes Ollis, Brandi L, NP  gabapentin (NEURONTIN) 100 MG capsule TAKE ONE CAPSULE BY MOUTH AT 8AM AND ONE CAPSULE AT 4PM Patient taking differently: Take 100 mg by mouth 2 (two) times daily. TAKE ONE CAPSULE BY MOUTH AT 8AM AND ONE CAPSULE AT 4PM 10/29/17  Yes Burns, [name], MD  gabapentin (NEURONTIN) 400 MG capsule TAKE ONE CAPSULE BY MOUTH AT BEDTIME 10/29/17  Yes Burns, [name], MD  Ibuprofen-diphenhydrAMINE HCl (ADVIL PM) 200-25 MG CAPS Take 2 tablets by mouth at bedtime as needed (sleep).   Yes [provider]  lactose free nutrition (BOOST PLUS) LIQD Take 237 mLs by mouth 3 (three) times daily with meals. 10/24/16  Yes Sheikh, Omair Latif, DO  levothyroxine (SYNTHROID, LEVOTHROID) 150 MCG tablet TAKE ONE TABLET BY MOUTH DAILY 08/22/17  Yes Burns, [name], MD  mirabegron ER (MYRBETRIQ) 50 MG TB24 tablet Take 50 mg by mouth daily.   Yes [provider]  Multiple Vitamin (MULTIVITAMIN WITH MINERALS) TABS tablet Take 1 tablet by mouth daily at 12 noon.   Yes [provider]  Multiple Vitamins-Minerals (ICAPS AREDS 2 PO) Take 1-2 tablets by mouth See admin instructions. 2 tablets @ noon and 1 tablet @dinner    Yes [provider]  Tiotropium Bromide-Olodaterol (STIOLTO RESPIMAT) 2.5-2.5 MCG/ACT AERS Inhale 2 puffs into the lungs daily. 01/04/18  Yes Juanito Doom, MD  traZODone (DESYREL) 100 MG tablet TAKE ONE TABLET BY MOUTH AT BEDTIME Patient taking differently: Take 100 mg by mouth at bedtime.  11/05/17  Yes Juanito Doom, MD  trimethoprim (TRIMPEX) 100 MG tablet Take 100 mg by mouth at bedtime.  01/25/17  Yes [provider]  sodium chloride (OCEAN) 0.65 % SOLN nasal spray Place 1 spray into both nostrils as needed for congestion. Patient not taking: Reported  on 01/04/2018 05/12/16   Donita Brooks, NP  Spacer/Aero-Holding Chambers (AEROCHAMBER MV) inhaler Use as instructed 12/21/17   Lauraine Rinne, NP   Allergies    Allergen Reactions  . Isosorbide Other (See Comments)    Reaction:  Headaches    Review of Systems Occasional shortness of breath. None currently. Denies pain.  Physical Exam Elderly gentleman sitting in his chair. Awake alert oriented Clear breath sounds S 1-S 2 Abdomen is soft non tender Patient is a little hard of hearing otherwise he is awake alert oriented Mood and affect within normal limits.  Vital Signs: BP 127/73 (BP Location: Left Arm)   Pulse 84   Temp 98 F (36.7 C) (Axillary)   Resp 18   Ht 5\' 7"  (1.702 m)   Wt 78.5 kg   SpO2 95%   BMI 27.11 kg/m  Pain Scale: 0-10   Pain Score: 0-No pain   SpO2: SpO2: 95 % O2 Device:SpO2: 95 % O2 Flow Rate: .O2 Flow Rate (L/min): 12 L/min  IO: Intake/output summary:   Intake/Output Summary (Last 24 hours) at 01/07/2018 1443 Last data filed at 01/07/2018 0517 Gross per 24 hour  Intake 429.16 ml  Output 3850 ml  Net -3420.84 ml    LBM: Last BM Date: 01/06/18 Baseline Weight: Weight: 81.2 kg Most recent weight: Weight: 78.5 kg     Palliative Assessment/Data:   Flowsheet Rows     Most Recent Value  Intake Tab  Referral Department  Hospitalist  Unit at Time of Referral  ICU  Palliative Care Primary Diagnosis  Pulmonary  Date Notified  01/06/18  Palliative Care Type  New Palliative care  Reason for referral  Counsel Regarding Hospice  Date of Admission  01/04/18  # of days IP prior to Palliative referral  2  Clinical Assessment  Psychosocial & Spiritual Assessment  Palliative Care Outcomes     PPS 50%  Time In:  12 Time Out:  1300 Time Total:  60 min  Greater than  50%  of this time was spent counseling and coordinating care related to the above assessment and plan.  Signed by: Loistine Chance, MD  [phone]  Please contact Palliative Medicine Team phone at [phone] for questions and concerns.  For individual provider: See Shea Evans

## 2018-01-07 NOTE — Progress Notes (Signed)
Physical Therapy Treatment Patient Details Name: Bradley Bennett MRN: [id number] DOB: February 25, 1932 Today's Date: 01/07/2018    History of Present Illness SHINE MIKES is a 84 y.o. male with medical history significant of COPD, centrilobular emphysema, pulmonary fibrosis, CAD status post CABG and PCI, hypertension, hyperlipidemia, Parkinson's disease presenting to the hospital for evaluation of shortness of breath    PT Comments    Patient preferred to transfer to recliner, did not want to ambulate. Continue PT.   Follow Up Recommendations  Home health PT  May benefit.     Equipment Recommendations  None recommended by PT    Recommendations for Other Services       Precautions / Restrictions Precautions Precautions: Fall Precaution Comments: oxygen dependent, minimal ambulator in apt. Restrictions Weight Bearing Restrictions: No    Mobility  Bed Mobility Overal bed mobility: Modified Independent                Transfers     Transfers: Sit to/from Stand Sit to Stand: Min guard         General transfer comment: without RW, declined to ambulate in room, on 12 L>  HFNC  Ambulation/Gait                 Stairs             Wheelchair Mobility    Modified Rankin (Stroke Patients Only)       Balance                                            Cognition Arousal/Alertness: Awake/alert                                            Exercises      General Comments        Pertinent Vitals/Pain Pain Assessment: No/denies pain    Home Living                      Prior Function            PT Goals (current goals can now be found in the care plan section) Progress towards PT goals: Progressing toward goals    Frequency    Min 3X/week      PT Plan Current plan remains appropriate    Co-evaluation              AM-PAC PT "6 Clicks" Mobility   Outcome Measure  Help needed  turning from your back to your side while in a flat bed without using bedrails?: None Help needed moving from lying on your back to sitting on the side of a flat bed without using bedrails?: A Little Help needed moving to and from a bed to a chair (including a wheelchair)?: A Little Help needed standing up from a chair using your arms (e.g., wheelchair or bedside chair)?: A Little   Help needed climbing 3-5 steps with a railing? : Total 6 Click Score: 14    End of Session Equipment Utilized During Treatment: Oxygen Activity Tolerance: Treatment limited secondary to medical complications (Comment) Patient left: in chair;with call bell/phone within reach Nurse Communication: Mobility status PT Visit Diagnosis: Difficulty in walking, not elsewhere classified (R26.2)  Time: 7092-9574 PT Time Calculation (min) (ACUTE ONLY): 14 min  Charges:  $Therapeutic Activity: 8-22 mins                     Tresa Endo PT Acute Rehabilitation Services Pager [phone] Office [phone] '   Claretha Cooper 01/07/2018, 11:10 AM

## 2018-01-07 NOTE — Progress Notes (Signed)
PROGRESS NOTE    [name]  [name]:[id number] DOB: 07/12/1932 DOA: 01/04/2018 PCP: [name], MD   Brief Narrative:  HPI On 01/04/2018 by Dr. Shela Leff Bradley Bennett is a 84 y.o. male with medical history significant of COPD, centrilobular emphysema, pulmonary fibrosis, CAD status post CABG and PCI, hypertension, hyperlipidemia presenting to the hospital for evaluation of shortness of breath.  Patient reports having chronic dyspnea which has been worse recently.  States at baseline he uses 10 L oxygen at rest and 15 L with exertion.  For the past few days he has required 12 L oxygen at rest.  He has been coughing more and the cough has been productive of more sputum.  Denies having any fevers, chills, or chest pain.  He has been compliant with his home inhaler regimen.  Interim histor Admitted for acute on chronic hypoxemic respiratory failure and COPD exacerbation. Assessment & Plan   Acute on chronic hypoxic respiratory failure secondary to COPD exacerbation with underlying emphysema and pulmonary fibrosis -Patient uses 10 L of home oxygen at rest and 15 L with exertion -Currently afebrile, no leukocytosis -CXR reviewed, showing chronic fribrosis, no infection -blood cultures show no growth -initially required nonrebreather  -Currently on nasal canula, 12L; patient ambulated 01/05/18 on 15L and O2 saturations dropped to 74% -Continue Solu-Medrol, neb treatments, cefepime -?natural progression of pulm fibrosis  -palliative care consulted and appreciated, recommended outpatient palliative care to follow  Nonsustained V fib/VTach -Patient had nonsustained run of V. Fib/Tach  -K and mag WNL -Cardiology consulted and appreciated, and feels this is artifact given tremors  Elevated lactic acidosis -Lactic acid peaked at 5.6, now back down to 2.2- pending lab this morning -Suspect worsened by patient's respiratory issues -Admitting physician discussed with Dr.  dictating, PCCM, recommended IV fluid resuscitation -Procalcitonin <0.1  Physical deconditioning -PT evaluated patient, may benefit from [facility] -OT no further therapy needed  CAD -H/o of CABG and PCI -Troponin unremarkable, EKG not suggestive of ACS -No complaints of chest pain -Continue aspirin, statin  Hypothyroidism -TSH 0.528 -Continue Synthroid  Hyperlipidemia -Continue statin  Depression -Continue buspirone and trazodone  Parkinson's disease -Continue Sinemet  BPH -Continue finasteride  Chronic back pain -Continue gabapentin, Tylenol  Overactive bladder -Continue mirabegron   DVT Prophylaxis  lovenox  Code Status: DNR  Family Communication: None at bedside  Disposition Plan: Admitted. Pending improvement in respiratory status- suspect on 01/08/2018  Consultants Palliative care Cardiology  Procedures  None  Antibiotics   Anti-infectives (From admission, onward)   Start     Dose/Rate Route Frequency Ordered Stop   01/04/18 2000  ceFEPIme (MAXIPIME) 1 g in sodium chloride 0.9 % 100 mL IVPB     1 g 200 mL/hr over 30 Minutes Intravenous Every 8 hours 01/04/18 1908 01/09/18 1959      Subjective:   Bradley Bennett seen and examined today.  Feels breathing has improved but not back to baseline.  States that he can combine 2 oxygen tanks to get 30 L of oxygen at home.  Would like to go home.  Denies current chest pain, abdominal pain, nausea or vomiting, diarrhea or constipation, dizziness or headache.  Objective:   Vitals:   01/07/18 0753 01/07/18 0800 01/07/18 1200 01/07/18 1444  BP:  122/73 127/73   Pulse: 63 67 84 77  Resp: 20 14 18 18   Temp:  98.2 F (36.8 C) 98 F (36.7 C)   TempSrc:  Oral Axillary   SpO2: 90% 93% 95%  95%  Weight:      Height:        Intake/Output Summary (Last 24 hours) at 01/07/2018 1458 Last data filed at 01/07/2018 2130 Gross per 24 hour  Intake 429.16 ml  Output 3850 ml  Net -3420.84 ml   Filed Weights   01/04/18  2200 01/07/18 0456  Weight: 81.2 kg 78.5 kg   Exam  General: Well developed, well nourished, NAD, appears stated age  73: NCAT, mucous membranes moist.   Neck: Supple  Cardiovascular: S1 S2 auscultated, no murmur  Respiratory: Diminished but clear  Abdomen: Soft, nontender, nondistended, + bowel sounds  Extremities: warm dry without cyanosis clubbing or edema  Neuro: AAOx3, Hard of hearing, otherwise nonfocal  Psych: Normal affect and demeanor, pleasant   Data Reviewed: I have personally reviewed following labs and imaging studies  CBC: Recent Labs  Lab 01/04/18 1523  WBC 8.9  NEUTROABS 6.6  HGB 14.6  HCT 46.7  MCV 97.5  PLT 865   Basic Metabolic Panel: Recent Labs  Lab 01/04/18 1523 01/04/18 2221 01/05/18 0756 01/06/18 1700 01/07/18 0758  NA 140  --  142 139 139  K 4.1  --  4.6 3.6 4.3  CL 106  --  112* 105 101  CO2 25  --  23 28 30   GLUCOSE 146*  --  109* 105* 87  BUN 22  --  15 14 16   CREATININE 0.95  --  0.58* 0.67 0.68  CALCIUM 8.7*  --  8.1* 8.2* 8.5*  MG  --  1.9 1.9 1.8 2.2   GFR: Estimated Creatinine Clearance: 65.4 mL/min (by C-G formula based on SCr of 0.68 mg/dL). Liver Function Tests: Recent Labs  Lab 01/04/18 1523  AST 26  ALT 10  ALKPHOS 73  BILITOT 0.9  PROT 6.5  ALBUMIN 3.5   No results for input(s): LIPASE, AMYLASE in the last 168 hours. No results for input(s): AMMONIA in the last 168 hours. Coagulation Profile: No results for input(s): INR, PROTIME in the last 168 hours. Cardiac Enzymes: Recent Labs  Lab 01/04/18 1523  TROPONINI <0.03   BNP (last 3 results) No results for input(s): PROBNP in the last 8760 hours. HbA1C: No results for input(s): HGBA1C in the last 72 hours. CBG: No results for input(s): GLUCAP in the last 168 hours. Lipid Profile: No results for input(s): CHOL, HDL, LDLCALC, TRIG, CHOLHDL, LDLDIRECT in the last 72 hours. Thyroid Function Tests: Recent Labs    01/05/18 0140  TSH 0.528    Anemia Panel: No results for input(s): VITAMINB12, FOLATE, FERRITIN, TIBC, IRON, RETICCTPCT in the last 72 hours. Urine analysis:    Component Value Date/Time   COLORURINE YELLOW 01/04/2018 [place] 01/04/2018 1639   LABSPEC 1.021 01/04/2018 1639   PHURINE 6.0 01/04/2018 1639   GLUCOSEU NEGATIVE 01/04/2018 1639   GLUCOSEU NEGATIVE 01/08/2017 1119   HGBUR NEGATIVE 01/04/2018 1639   BILIRUBINUR NEGATIVE 01/04/2018 1639   KETONESUR 5 (A) 01/04/2018 1639   PROTEINUR NEGATIVE 01/04/2018 1639   UROBILINOGEN 0.2 01/08/2017 1119   NITRITE NEGATIVE 01/04/2018 1639   LEUKOCYTESUR NEGATIVE 01/04/2018 1639   Sepsis Labs: @LABRCNTIP (procalcitonin:4,lacticidven:4)  ) Recent Results (from the past 240 hour(s))  Culture, blood (Routine X 2) w Reflex to ID Panel     Status: None (Preliminary result)   Collection Time: 01/04/18  3:35 PM  Result Value Ref Range Status   Specimen Description   Final    BLOOD RIGHT FOREARM Performed at [facility], [place] [address], [place], [place] [address]    Special Requests   Final    BOTTLES DRAWN AEROBIC AND ANAEROBIC Blood Culture results may not be optimal due to an excessive volume of blood received in culture bottles Performed at [facility] [address] [place], [place] [address]    Culture   Final    NO GROWTH 3 DAYS Performed at [facility], [place] [address], [place], [place] [address]    Report Status PENDING  Incomplete  MRSA PCR Screening     Status: None   Collection Time: 01/04/18  9:49 PM  Result Value Ref Range Status   MRSA by PCR NEGATIVE NEGATIVE Final    Comment:        The GeneXpert MRSA Assay (FDA approved for NASAL specimens only), is one component of a comprehensive MRSA colonization surveillance program. It is not intended to diagnose MRSA infection nor to guide or monitor treatment for MRSA infections. Performed at [facility] [place] [address] [place], [place] [address]   Culture, blood (Routine X 2) w Reflex to ID Panel     Status: None (Preliminary result)   Collection Time: 01/04/18 10:21 PM  Result Value Ref Range Status   Specimen Description   Final    BLOOD LEFT ANTECUBITAL Performed at [facility] [address], [place], [place] [address]    Special Requests   Final    BOTTLES DRAWN AEROBIC AND ANAEROBIC Blood Culture adequate volume Performed at [facility] [address] [place], [place] [address]    Culture   Final    NO GROWTH 2 DAYS Performed at [facility] [address], [place],  [address]    Report Status PENDING  Incomplete      Radiology Studies: No results found.   Scheduled Meds: . aspirin EC  81 mg Oral Daily  . atorvastatin  20 mg Oral q1800  . busPIRone  5 mg Oral BID  . carbidopa-levodopa  1 tablet Oral QHS  . carbidopa-levodopa  2 tablet Oral TID AC  . enoxaparin (LOVENOX) injection  40 mg Subcutaneous Q24H  . finasteride  5 mg Oral QPM  . gabapentin  100 mg Oral 2 times per day  . gabapentin  400 mg Oral QHS  . ipratropium  0.5 mg Nebulization TID  . lactose free nutrition  237 mL Oral TID WC  . levalbuterol  0.63 mg Nebulization TID  . levothyroxine  150 mcg Oral Q0600  . mouth rinse  15 mL Mouth Rinse BID  . methylPREDNISolone (SOLU-MEDROL) injection  60 mg Intravenous Daily  . mirabegron ER  50 mg Oral Daily  . multivitamin with minerals  1 tablet Oral Q1200  . traZODone  100 mg Oral QHS   Continuous Infusions: . sodium chloride Stopped (01/06/18 1546)  . ceFEPime (MAXIPIME) IV Stopped (01/07/18 1432)     LOS: 2 days   Time Spent in minutes   30 minutes  Stepahnie Campo D.O. on 01/07/2018 at 2:58 PM  Between 7am to 7pm - Please see pager noted on [email or web address]  After 7pm go to [email or web address]  And look for the night coverage person covering for me after hours  [facility] Office   [phone]

## 2018-01-08 ENCOUNTER — Telehealth: Payer: Self-pay | Admitting: Pulmonary Disease

## 2018-01-08 ENCOUNTER — Ambulatory Visit (INDEPENDENT_AMBULATORY_CARE_PROVIDER_SITE_OTHER): Payer: Medicare Other | Admitting: Adult Health

## 2018-01-08 ENCOUNTER — Encounter: Payer: Self-pay | Admitting: Adult Health

## 2018-01-08 ENCOUNTER — Inpatient Hospital Stay (HOSPITAL_COMMUNITY): Payer: Medicare Other

## 2018-01-08 VITALS — BP 164/74 | HR 86

## 2018-01-08 DIAGNOSIS — J8410 Pulmonary fibrosis, unspecified: Secondary | ICD-10-CM | POA: Diagnosis not present

## 2018-01-08 DIAGNOSIS — J441 Chronic obstructive pulmonary disease with (acute) exacerbation: Secondary | ICD-10-CM | POA: Diagnosis not present

## 2018-01-08 DIAGNOSIS — J9611 Chronic respiratory failure with hypoxia: Secondary | ICD-10-CM | POA: Diagnosis not present

## 2018-01-08 DIAGNOSIS — I2510 Atherosclerotic heart disease of native coronary artery without angina pectoris: Secondary | ICD-10-CM

## 2018-01-08 MED ORDER — CEFUROXIME AXETIL 250 MG PO TABS: 250.0000 mg | ORAL_TABLET | Freq: Two times a day (BID) | ORAL | 0 refills | Status: AC

## 2018-01-08 MED ORDER — PREDNISONE 20 MG PO TABS: ORAL_TABLET | ORAL | 0 refills | Status: DC

## 2018-01-08 NOTE — Assessment & Plan Note (Signed)
Possible flare with progressive pulmonary fibrosis.  Patient is to finish antibiotics and steroids as recommended.  Continue on his current regimen.

## 2018-01-08 NOTE — Telephone Encounter (Signed)
Spoke with pt's daughter, Margarita Grizzle. States that the pt is being discharged from the hospital today. Margarita Grizzle states that the pt needs to be on oxygen but he is not being discharged with it. States, "He needs oxygen. I can't get him from my car to his apartment without him struggling to breathe." Pt has been scheduled for a HFU today at 3:15pm. Nothing further was needed.

## 2018-01-08 NOTE — Progress Notes (Signed)
@Patient  ID: Bradley Bennett, male    DOB: 12-07-1932, 84 y.o.   MRN: [id number]  Chief Complaint  Patient presents with  . Hospitalization Follow-up    Referring provider: [name], MD  HPI: 83 year old male former smoker followed for pulmonary fibrosis and COPD with a history of chronic aspiration in the setting of Parkinson's disease. Along with chronic hypoxic respiratory failure on O2  Independent Living =Apartment . Lives alone. Daughter helps .  Previous air traffic controller      TEST/EVENTS :  PFT 01/2016 FEV1 114%, ratio 75, FVC 114, DLCO 20%.  CXR 09/26/16 >widespread fibrotic changes   01/08/2018 Follow up ; Pulmonary Fibrosis , Post hospital follow up .  Patient presents for a post hospital follow-up.  Patient was admitted 11/29 through 01/08/2018 for acute on chronic hypoxic respiratory failure secondary to COPD and pulmonary fibrosis exacerbation.  Patient has very severe pulmonary fibrosis at baseline on oxygen 6 L at rest and 15 L of oxygen with activity prior to admission.  Patient was admitted with progressively worsening shortness of breath and hypoxemia despite high flow oxygen.  He required 10 to 12 L at rest to maintain O2 saturations greater than 88 to 90%.  During his hospital stay chest x-ray showed chronic fibrosis.  He remained afebrile with no leukocytosis.  He initially required nonrebreather high flow oxygen but was weaned to 10 L of oxygen at rest and 15 L of oxygen with activity. He was treated empirically with antibiotics.  And started on a prednisone burst.  Patient did have some improvement and was able to maintain O2 saturations.  Patient was seen by physical therapy and palliative care.  Has been recommended for outpatient PT and palliative care . Patient lives in independent living at a apartment by himself.  His daughter is local and tries to help him.  They have brought him by the office today due to concerns of oxygen he only has one  concentrator that will go up to 10 L.  We have contacted his homecare company to supply additional concentrator so he can increase his oxygen to 15 L with activity.  Per case management notes palliative care and home health have been set up to help with home care needs.  I discussed in detail with his daughter that if it is not felt that he can manage at home independently with assistance then we will consult with palliative care for transition into hospice if indicated .  Patient and daughter are acceptable to this.  Patient is very hard of hearing so I went over this in detail and got him to repeat his understanding of this.Marland Kitchen He is acceptable to this.  Allergies  Allergen Reactions  . Isosorbide Other (See Comments)    Reaction:  Headaches     Immunization History  Administered Date(s) Administered  . Influenza Split 11/09/2014  . Influenza, High Dose Seasonal PF 12/04/2015, 11/01/2016, 10/05/2017  . Pneumococcal Conjugate-13 01/14/2016  . Pneumococcal Polysaccharide-23 10/12/2014    Past Medical History:  Diagnosis Date  . Arthritis   . Asthma   . Coronary artery disease    ASCAD with MI in 1992 and subsequently underwent 4 vessel CABG in [place].  Since then he has had several PCIs with the last one being 2015.    . Emphysema of lung ([place])   . Hearing difficulty of both ears   . Hyperlipidemia   . Hypertension   . ILD (interstitial lung disease) ([place])   .  Parkinson disease ([place])     Tobacco History: Social History   Tobacco Use  Smoking Status Former Smoker  . Packs/day: 2.00  . Years: 35.00  . Pack years: 70.00  . Last attempt to quit: 08/15/1985  . Years since quitting: 32.4  Smokeless Tobacco Never Used  Tobacco Comment   quit smoking in 1992   Counseling given: Not Answered Comment: quit smoking in 1992   Outpatient Medications Prior to Visit  Medication Sig Dispense Refill  . acetaminophen (TYLENOL) 500 MG tablet Take 500-1,000 mg by mouth every 6 (six)  hours as needed for mild pain, moderate pain, fever or headache.     Marland Kitchen aspirin EC 81 MG tablet Take 1 tablet (81 mg total) by mouth daily. 90 tablet 3  . atorvastatin (LIPITOR) 20 MG tablet Take 1 tablet (20 mg total) by mouth daily at 6 PM. 90 tablet 1  . busPIRone (BUSPAR) 5 MG tablet Take 1 tablet (5 mg total) by mouth 2 (two) times daily. 180 tablet 1  . carbidopa-levodopa (SINEMET CR) 50-200 MG tablet Take 1 tablet by mouth at bedtime. 90 tablet 1  . carbidopa-levodopa (SINEMET IR) 25-100 MG tablet Take 2 tablets by mouth 3 (three) times daily. 540 tablet 1  . cefUROXime (CEFTIN) 250 MG tablet Take 1 tablet (250 mg total) by mouth 2 (two) times daily for 2 days. 4 tablet 0  . finasteride (PROSCAR) 5 MG tablet Take 1 tablet (5 mg total) by mouth every evening.    . gabapentin (NEURONTIN) 100 MG capsule TAKE ONE CAPSULE BY MOUTH AT 8AM AND ONE CAPSULE AT 4PM (Patient taking differently: Take 100 mg by mouth 2 (two) times daily. TAKE ONE CAPSULE BY MOUTH AT 8AM AND ONE CAPSULE AT 4PM) 180 capsule 1  . gabapentin (NEURONTIN) 400 MG capsule TAKE ONE CAPSULE BY MOUTH AT BEDTIME 90 capsule 1  . Ibuprofen-diphenhydrAMINE HCl (ADVIL PM) 200-25 MG CAPS Take 2 tablets by mouth at bedtime as needed (sleep).    . lactose free nutrition (BOOST PLUS) LIQD Take 237 mLs by mouth 3 (three) times daily with meals. 30 Can 0  . levothyroxine (SYNTHROID, LEVOTHROID) 150 MCG tablet TAKE ONE TABLET BY MOUTH DAILY 90 tablet 1  . mirabegron ER (MYRBETRIQ) 50 MG TB24 tablet Take 50 mg by mouth daily.    . Multiple Vitamin (MULTIVITAMIN WITH MINERALS) TABS tablet Take 1 tablet by mouth daily at 12 noon.    . Multiple Vitamins-Minerals (ICAPS AREDS 2 PO) Take 1-2 tablets by mouth See admin instructions. 2 tablets @ noon and 1 tablet @dinner     . predniSONE (DELTASONE) 20 MG tablet Daily with breakfast: Take 3 tabs x 2 days, then 2 tabs x 3 days, then 1 tab x 3 days. 15 tablet 0  . sodium chloride (OCEAN) 0.65 % SOLN nasal  spray Place 1 spray into both nostrils as needed for congestion. 1 Bottle 2  . Spacer/Aero-Holding Chambers (AEROCHAMBER MV) inhaler Use as instructed 1 each 0  . Tiotropium Bromide-Olodaterol (STIOLTO RESPIMAT) 2.5-2.5 MCG/ACT AERS Inhale 2 puffs into the lungs daily. 3 Inhaler 1  . traZODone (DESYREL) 100 MG tablet TAKE ONE TABLET BY MOUTH AT BEDTIME (Patient taking differently: Take 100 mg by mouth at bedtime. ) 90 tablet 0  . trimethoprim (TRIMPEX) 100 MG tablet Take 100 mg by mouth at bedtime.   11   No facility-administered medications prior to visit.      Review of Systems  Constitutional:   No  weight loss,  night sweats,  Fevers, chills,  +fatigue, or  lassitude.  HEENT:   No headaches,  Difficulty swallowing,  Tooth/dental problems, or  Sore throat,                No sneezing, itching, ear ache, nasal congestion, post nasal drip, hard of hearing +  CV:  No chest pain,  Orthopnea, PND, swelling in lower extremities, anasarca, dizziness, palpitations, syncope.   GI  No heartburn, indigestion, abdominal pain, nausea, vomiting, diarrhea, change in bowel habits, loss of appetite, bloody stools.   Resp:  No chest wall deformity  Skin: no rash or lesions.  GU: no dysuria, change in color of urine, no urgency or frequency.  No flank pain, no hematuria   MS:  No joint pain or swelling.  No decreased range of motion.  No back pain.    Physical Exam  BP (!) 164/74 (BP Location: Right Arm, Patient Position: Sitting, Cuff Size: Normal)   Pulse 86   SpO2 96%   GEN: A/Ox3; pleasant , NAD, + chronically ill appearing , on O2 , wc    HEENT:  [place]/AT,  EACs-R clear , Left wax impaction ,  NOSE-clear, THROAT-clear, no lesions, no postnasal drip or exudate noted.   NECK:  Supple w/ fair ROM; no JVD; normal carotid impulses w/o bruits; no thyromegaly or nodules palpated; no lymphadenopathy.    RESP  BB crackles   no accessory muscle use, no dullness to percussion  CARD:  RRR, no  m/r/g, tr peripheral edema, pulses intact, no cyanosis or clubbing.  GI:   Soft & nt; nml bowel sounds; no organomegaly or masses detected.   Musco: Warm bil, no deformities or joint swelling noted.   Neuro: alert, no focal deficits noted.    Skin: Warm, no lesions or rashes    Lab Results:  CBC    Component Value Date/Time   WBC 8.9 01/04/2018 1523   RBC 4.79 01/04/2018 1523   HGB 14.6 01/04/2018 1523   HCT 46.7 01/04/2018 1523   PLT 263 01/04/2018 1523   MCV 97.5 01/04/2018 1523   MCH 30.5 01/04/2018 1523   MCHC 31.3 01/04/2018 1523   RDW 14.3 01/04/2018 1523   LYMPHSABS 1.2 01/04/2018 1523   MONOABS 0.6 01/04/2018 1523   EOSABS 0.4 01/04/2018 1523   BASOSABS 0.0 01/04/2018 1523    BMET    Component Value Date/Time   NA 139 01/07/2018 0758   K 4.3 01/07/2018 0758   CL 101 01/07/2018 0758   CO2 30 01/07/2018 0758   GLUCOSE 87 01/07/2018 0758   BUN 16 01/07/2018 0758   CREATININE 0.68 01/07/2018 0758   CALCIUM 8.5 (L) 01/07/2018 0758   GFRNONAA >60 01/07/2018 0758   GFRAA >60 01/07/2018 0758    BNP    Component Value Date/Time   BNP 187.2 (H) 05/10/2016 1405    ProBNP    Component Value Date/Time   PROBNP 39.0 10/26/2016 1728    Imaging: Dg Chest 2 View  Result Date: 01/04/2018 CLINICAL DATA:  Cough cough EXAM: CHEST - 2 VIEW COMPARISON:  12/21/2017, 01/01/2017 FINDINGS: Coarse interstitial opacity and reticular changes consistent with pulmonary fibrosis, similar as compared with prior exams. No pleural effusion or acute focal airspace disease. Post sternotomy changes. Stable cardiomediastinal silhouette. No pneumothorax. IMPRESSION: Chronic fibrosis without definite acute focal airspace disease. Electronically Signed   By: Donavan Foil M.D.   On: 01/04/2018 17:01   Dg Chest 2 View  Result Date: 12/21/2017 CLINICAL DATA:  Cough and wheezing for several days EXAM: CHEST - 2 VIEW COMPARISON:  01/01/2017 FINDINGS: Cardiac shadow is stable.  Postsurgical changes are again seen. Diffuse fibrotic changes are again identified throughout both lungs. Apical bullous formation is noted and stable. No new focal abnormality is seen. IMPRESSION: Chronic fibrotic change without acute abnormality. Electronically Signed   By: Inez Catalina M.D.   On: 12/21/2017 12:02   Dg Chest Port 1 View  Result Date: 01/08/2018 CLINICAL DATA:  Hypoxia EXAM: PORTABLE CHEST 1 VIEW COMPARISON:  01/04/2018 FINDINGS: Cardiac shadow is mildly enlarged but stable. Postsurgical changes are again seen. Coronary stents are again noted. Lungs are well aerated bilaterally with diffuse fibrotic changes. Apical blebs are noted on the right. No focal infiltrate or sizable effusion is seen. The overall appearance is stable from the prior exam. IMPRESSION: Chronic fibrotic changes without acute abnormality. Electronically Signed   By: Inez Catalina M.D.   On: 01/08/2018 07:37      PFT Results Latest Ref Rng & Units 03/02/2017 01/11/2016  FVC-Pre L 3.52 4.39  FVC-Predicted Pre % 88 108  FVC-Post L - 4.37  FVC-Predicted Post % - 107  Pre FEV1/FVC % % 76 75  Post FEV1/FCV % % - 72  FEV1-Pre L 2.67 3.28  FEV1-Predicted Pre % 94 114  FEV1-Post L - 3.14  DLCO UNC% % - 20  DLCO COR %Predicted % - 24  TLC L - 8.55  TLC % Predicted % - 119  RV % Predicted % - 130    No results found for: NITRICOXIDE      Assessment & Plan:   Postinflammatory pulmonary fibrosis (HCC) Progressive pulmonary fibrosis requiring high flow oxygen It appears very appropriate for palliative care to be involved in patient's care going forward.   Plan to patient and daughter that it appears patient is going to need more detailed care and assistance in the home setting.  And may need to be in assisted or skilled living facility in order to provide support.. For now will provide oxygen concentrator so patient can continue on oxygen 2 L at rest and 15 L with activity.  Patient has a home Oxymizer  tubing.   Plan  Patient Instructions  Please use Oxygen 10 L of oxygen with rest and 15l/m with activity  We will set up DME to get this set up .  Palliative Care to see in Outpatient setting .  Home Health consulted for home evaluation for services  Continue on Stiolto 2 puffs daily .  Finish Ceftin and Prednisone as directed.  Follow up with Dr. [place] Bells in 2 weeks and As needed   Please contact office for sooner follow up if symptoms do not improve or worsen or seek emergency care       COPD (chronic obstructive pulmonary disease) ([place]) Possible flare with progressive pulmonary fibrosis.  Patient is to finish antibiotics and steroids as recommended.  Continue on his current regimen.  Chronic respiratory failure ([place]) Patient with underlying severe pulmonary fibrosis along with COPD.  Patient is requiring higher flow oxygen.  He needs 10 L at rest and 15 L with activity to prevent severe hypoxemia.  Patient home care company has been contacted and they are to provide him with an additional concentrator to help facilitate these needs.  He is at home Oxymizer to be to help with this as well.     Rexene Edison, NP 01/08/2018

## 2018-01-08 NOTE — Discharge Summary (Signed)
Physician Discharge Summary  Bradley Bennett GMW:[id number] DOB: 1932/04/22 DOA: 01/04/2018  PCP: [name], MD  Admit date: 01/04/2018 Discharge date: 01/08/2018  Time spent: 45 minutes  Recommendations for Outpatient Follow-up:  Patient will be discharged to home with palliative care to follow and home health physical therapy and nursing.  Patient will need to follow up with primary care provider within one week of discharge.  Patient will need to follow up with pulmonology, Dr. Lake Bells. Patient should continue medications as prescribed.  Patient should follow a heart healthy diet.   Discharge Diagnoses:  Acute on chronic hypoxic respiratory failure secondary to COPD exacerbation with underlying emphysema and pulmonary fibrosis Nonsustained V fib/VTach Elevated lactic acidosis Physical deconditioning CAD Hypothyroidism Hyperlipidemia Depression Parkinson's disease BPH Chronic back pain Overactive bladder  Discharge Condition: stable  Diet recommendation: heart healthy  Filed Weights   01/04/18 2200 01/07/18 0456  Weight: 81.2 kg 78.5 kg    History of present illness:  On 01/04/2018 by Dr. Maxwell Caul McDonaldis a 84 y.o.malewith medical history significant ofCOPD, centrilobular emphysema, pulmonary fibrosis, CAD status post CABGand PCI,hypertension, hyperlipidemia presenting to the hospital for evaluation of shortness of breath.Patient reports having chronic dyspnea whichhas been worse recently. States at baseline he uses 10 L oxygen at rest and 15 L with exertion. For the past few days he has required 12 L oxygen at rest. He has been coughing more and the cough has been productive of more sputum. Denies having any fevers, chills, or chest pain. He has been compliant with his home inhaler regimen.  Hospital Course:  Acute on chronic hypoxic respiratory failure secondary to COPD exacerbation with underlying emphysema and pulmonary  fibrosis -Patient uses 10 L of home oxygen at rest and 15 L with exertion -Currently afebrile, no leukocytosis -CXR reviewed, showing chronic fribrosis, no infection -blood cultures show no growth -initially required nonrebreather  -was placed on Solu-Medrol, neb treatments, cefepime -?natural progression of pulm fibrosis  -palliative care consulted and appreciated, recommended outpatient palliative care to follow- daughter would like to be contacted by outpatient palliative care -Discussed with Dr. Lake Bells, pulm, feels that patient may have a shunt which causes him to have oxygen desaturations, however no further intervention can be done for this. Continue home course with outpatient follow up.  -Will discharge ceftin for additional 2 days and prednisone taper  Nonsustained V fib/VTach -Patient had nonsustained run of V. Fib/Tach  -K and mag WNL -Cardiology consulted and appreciated, and feels this is artifact given tremors  Elevated lactic acidosis -Lactic acid peaked at 5.6, now back down to 2.2- pending lab this morning -Suspect worsened by patient's respiratory issues -Admitting physician discussed PCCM, recommended IV fluid resuscitation -Procalcitonin <0.1  Physical deconditioning -PT evaluated patient, may benefit from [facility] -OT no further therapy needed  CAD -H/o of CABG and PCI -Troponin unremarkable, EKG not suggestive of ACS -No complaints of chest pain -Continue aspirin, statin  Hypothyroidism -TSH 0.528 -Continue Synthroid  Hyperlipidemia -Continue statin  Depression -Continue buspirone and trazodone  Parkinson's disease -Continue Sinemet  BPH -Continue finasteride  Chronic back pain -Continue gabapentin, Tylenol  Overactive bladder -Continue mirabegron   Code status: DNR  Consultants Palliative care Cardiology Pulmonology, via phone, Dr. Lake Bells  Procedures  None  Discharge Exam: Vitals:   01/08/18 0800 01/08/18 0826  BP: (!)  149/71   Pulse: 68 74  Resp: 14 18  Temp: 97.7 F (36.5 C)   SpO2: 98% 99%   Patient states he  is feeling better and would like to go home. Feels his breathing is at baseline. Denies current chest pain, abdominal pain, N/V/D/C, dizziness, headache.    General: Well developed, well nourished, NAD, appears stated age  60: NCAT, mucous membranes moist.  Neck: Supple  Cardiovascular: S1 S2 auscultated, no murmurs, RRR  Respiratory:   Abdomen: Soft, nontender, nondistended, + bowel sounds  Extremities: warm dry without cyanosis clubbing or edema  Neuro: AAOx3, hard of hearing, nonfocal  Psych: Normal affect and demeanor with intact judgement and insight, very pleasant   Discharge Instructions Discharge Instructions    Discharge instructions   Complete by:  As directed    Patient will be discharged to home with palliative care to follow and home health physical therapy and nursing.  Patient will need to follow up with primary care provider within one week of discharge.  Patient will need to follow up with pulmonology, Dr. Lake Bells. Patient should continue medications as prescribed.  Patient should follow a heart healthy diet.     Allergies as of 01/08/2018      Reactions   Isosorbide Other (See Comments)   Reaction:  Headaches       Medication List    TAKE these medications   acetaminophen 500 MG tablet Commonly known as:  TYLENOL Take 500-1,000 mg by mouth every 6 (six) hours as needed for mild pain, moderate pain, fever or headache.   ADVIL PM 200-25 MG Caps Generic drug:  Ibuprofen-diphenhydrAMINE HCl Take 2 tablets by mouth at bedtime as needed (sleep).   AEROCHAMBER MV inhaler Use as instructed   aspirin EC 81 MG tablet Take 1 tablet (81 mg total) by mouth daily.   atorvastatin 20 MG tablet Commonly known as:  LIPITOR Take 1 tablet (20 mg total) by mouth daily at 6 PM.   busPIRone 5 MG tablet Commonly known as:  BUSPAR Take 1 tablet (5 mg total) by  mouth 2 (two) times daily.   carbidopa-levodopa 50-200 MG tablet Commonly known as:  SINEMET CR Take 1 tablet by mouth at bedtime.   carbidopa-levodopa 25-100 MG tablet Commonly known as:  SINEMET IR Take 2 tablets by mouth 3 (three) times daily.   cefUROXime 250 MG tablet Commonly known as:  CEFTIN Take 1 tablet (250 mg total) by mouth 2 (two) times daily for 2 days.   finasteride 5 MG tablet Commonly known as:  PROSCAR Take 1 tablet (5 mg total) by mouth every evening.   gabapentin 400 MG capsule Commonly known as:  NEURONTIN TAKE ONE CAPSULE BY MOUTH AT BEDTIME What changed:  Another medication with the same name was changed. Make sure you understand how and when to take each.   gabapentin 100 MG capsule Commonly known as:  NEURONTIN TAKE ONE CAPSULE BY MOUTH AT 8AM AND ONE CAPSULE AT 4PM What changed:  See the new instructions.   ICAPS AREDS 2 PO Take 1-2 tablets by mouth See admin instructions. 2 tablets @ noon and 1 tablet @dinner    lactose free nutrition Liqd Take 237 mLs by mouth 3 (three) times daily with meals.   levothyroxine 150 MCG tablet Commonly known as:  SYNTHROID, LEVOTHROID TAKE ONE TABLET BY MOUTH DAILY   multivitamin with minerals Tabs tablet Take 1 tablet by mouth daily at 12 noon.   MYRBETRIQ 50 MG Tb24 tablet Generic drug:  mirabegron ER Take 50 mg by mouth daily.   predniSONE 20 MG tablet Commonly known as:  DELTASONE Daily with breakfast: Take 3 tabs x  2 days, then 2 tabs x 3 days, then 1 tab x 3 days.   sodium chloride 0.65 % Soln nasal spray Commonly known as:  OCEAN Place 1 spray into both nostrils as needed for congestion.   Tiotropium Bromide-Olodaterol 2.5-2.5 MCG/ACT Aers Inhale 2 puffs into the lungs daily.   traZODone 100 MG tablet Commonly known as:  DESYREL TAKE ONE TABLET BY MOUTH AT BEDTIME   trimethoprim 100 MG tablet Commonly known as:  TRIMPEX Take 100 mg by mouth at bedtime.            Durable Medical  Equipment  (From admission, onward)         Start     Ordered   01/08/18 1042  DME Oxygen  Once    Question Answer Comment  Mode or (Route) Nasal cannula   Liters per Minute 15   Frequency Continuous (stationary and portable oxygen unit needed)   Oxygen conserving device Yes   Oxygen delivery system Gas      01/08/18 1045         Allergies  Allergen Reactions  . Isosorbide Other (See Comments)    Reaction:  Headaches    Follow-up Information    [name], MD. Schedule an appointment as soon as possible for a visit in 1 week(s).   Specialty:  Internal Medicine Why:  Hospital follow up Contact information: [place] [address] [phone]        Juanito Doom, MD. Schedule an appointment as soon as possible for a visit in 1 week(s).   Specialty:  Pulmonary Disease Why:  Hospital follow up Contact information: [place]  [phone]            The results of significant diagnostics from this hospitalization (including imaging, microbiology, ancillary and laboratory) are listed below for reference.    Significant Diagnostic Studies: Dg Chest 2 View  Result Date: 01/04/2018 CLINICAL DATA:  Cough cough EXAM: CHEST - 2 VIEW COMPARISON:  12/21/2017, 01/01/2017 FINDINGS: Coarse interstitial opacity and reticular changes consistent with pulmonary fibrosis, similar as compared with prior exams. No pleural effusion or acute focal airspace disease. Post sternotomy changes. Stable cardiomediastinal silhouette. No pneumothorax. IMPRESSION: Chronic fibrosis without definite acute focal airspace disease. Electronically Signed   By: Donavan Foil M.D.   On: 01/04/2018 17:01   Dg Chest 2 View  Result Date: 12/21/2017 CLINICAL DATA:  Cough and wheezing for several days EXAM: CHEST - 2 VIEW COMPARISON:  01/01/2017 FINDINGS: Cardiac shadow is stable. Postsurgical changes are again seen. Diffuse fibrotic changes are  again identified throughout both lungs. Apical bullous formation is noted and stable. No new focal abnormality is seen. IMPRESSION: Chronic fibrotic change without acute abnormality. Electronically Signed   By: Inez Catalina M.D.   On: 12/21/2017 12:02   Dg Chest Port 1 View  Result Date: 01/08/2018 CLINICAL DATA:  Hypoxia EXAM: PORTABLE CHEST 1 VIEW COMPARISON:  01/04/2018 FINDINGS: Cardiac shadow is mildly enlarged but stable. Postsurgical changes are again seen. Coronary stents are again noted. Lungs are well aerated bilaterally with diffuse fibrotic changes. Apical blebs are noted on the right. No focal infiltrate or sizable effusion is seen. The overall appearance is stable from the prior exam. IMPRESSION: Chronic fibrotic changes without acute abnormality. Electronically Signed   By: Inez Catalina M.D.   On: 01/08/2018 07:37    Microbiology: Recent Results (from the past 240 hour(s))  Culture, blood (Routine X  2) w Reflex to ID Panel     Status: None (Preliminary result)   Collection Time: 01/04/18  3:35 PM  Result Value Ref Range Status   Specimen Description   Final    BLOOD RIGHT FOREARM Performed at [facility] [address] [place], [place] [address]    Special Requests   Final    BOTTLES DRAWN AEROBIC AND ANAEROBIC Blood Culture results may not be optimal due to an excessive volume of blood received in culture bottles Performed at [facility] [address], [place], [place] [address]    Culture   Final    NO GROWTH 3 DAYS Performed at [facility], [place] [address] [place], [place] [address]    Report Status PENDING  Incomplete  MRSA PCR Screening     Status: None   Collection Time: 01/04/18  9:49 PM  Result Value Ref Range Status   MRSA by PCR NEGATIVE NEGATIVE Final    Comment:        The GeneXpert MRSA Assay (FDA approved for NASAL specimens only), is one component of a comprehensive MRSA colonization surveillance  program. It is not intended to diagnose MRSA infection nor to guide or monitor treatment for MRSA infections. Performed at [facility], [place] [address], [place], [place] [address]   Culture, blood (Routine X 2) w Reflex to ID Panel     Status: None (Preliminary result)   Collection Time: 01/04/18 10:21 PM  Result Value Ref Range Status   Specimen Description   Final    BLOOD LEFT ANTECUBITAL Performed at [facility] [address], [place], [place] [address]    Special Requests   Final    BOTTLES DRAWN AEROBIC AND ANAEROBIC Blood Culture adequate volume Performed at [facility] [address], [place], [place] [address]    Culture   Final    NO GROWTH 2 DAYS Performed at [facility] [address], [place], [place] [address]    Report Status PENDING  Incomplete     Labs: Basic Metabolic Panel: Recent Labs  Lab 01/04/18 1523 01/04/18 2221 01/05/18 0756 01/06/18 1700 01/07/18 0758  NA 140  --  142 139 139  K 4.1  --  4.6 3.6 4.3  CL 106  --  112* 105 101  CO2 25  --  23 28 30   GLUCOSE 146*  --  109* 105* 87  BUN 22  --  15 14 16   CREATININE 0.95  --  0.58* 0.67 0.68  CALCIUM 8.7*  --  8.1* 8.2* 8.5*  MG  --  1.9 1.9 1.8 2.2   Liver Function Tests: Recent Labs  Lab 01/04/18 1523  AST 26  ALT 10  ALKPHOS 73  BILITOT 0.9  PROT 6.5  ALBUMIN 3.5   No results for input(s): LIPASE, AMYLASE in the last 168 hours. No results for input(s): AMMONIA in the last 168 hours. CBC: Recent Labs  Lab 01/04/18 1523  WBC 8.9  NEUTROABS 6.6  HGB 14.6  HCT 46.7  MCV 97.5  PLT 263   Cardiac Enzymes: Recent Labs  Lab 01/04/18 1523  TROPONINI <0.03   BNP: BNP (last 3 results) No results for input(s): BNP in the last 8760 hours.  ProBNP (last 3 results) No results for input(s): PROBNP in the last 8760 hours.  CBG: No results for input(s): GLUCAP in the last 168 hours.     Signed:  Cristal Ford  [facility] 01/08/2018, 10:46 AM

## 2018-01-08 NOTE — Assessment & Plan Note (Signed)
Patient with underlying severe pulmonary fibrosis along with COPD.  Patient is requiring higher flow oxygen.  He needs 10 L at rest and 15 L with activity to prevent severe hypoxemia.  Patient home care company has been contacted and they are to provide him with an additional concentrator to help facilitate these needs.  He is at home Oxymizer to be to help with this as well.

## 2018-01-08 NOTE — Discharge Instructions (Signed)

## 2018-01-08 NOTE — Assessment & Plan Note (Signed)
Progressive pulmonary fibrosis requiring high flow oxygen It appears very appropriate for palliative care to be involved in patient's care going forward.   Plan to patient and daughter that it appears patient is going to need more detailed care and assistance in the home setting.  And may need to be in assisted or skilled living facility in order to provide support.. For now will provide oxygen concentrator so patient can continue on oxygen 2 L at rest and 15 L with activity.  Patient has a home Oxymizer tubing.   Plan  Patient Instructions  Please use Oxygen 10 L of oxygen with rest and 15l/m with activity  We will set up DME to get this set up .  Palliative Care to see in Outpatient setting .  Home Health consulted for home evaluation for services  Continue on Stiolto 2 puffs daily .  Finish Ceftin and Prednisone as directed.  Follow up with Dr. Lake Bells in 2 weeks and As needed   Please contact office for sooner follow up if symptoms do not improve or worsen or seek emergency care

## 2018-01-08 NOTE — Care Management Note (Signed)
Case Management Note  Patient Details  Name: Bradley Bennett MRN: [id number] Date of Birth: [birth year]  Subjective/Objective:                  Discharged  Action/Plan: Returning to [place] with o2 and hhc-RN,PT and Resp. Therapy through [facility].  Choices given to patient wants to use [facility].  Expected Discharge Date:  01/08/18               Expected Discharge Plan:  [place]  In-House Referral:     Discharge planning Services  CM Consult  Post Acute Care Choice:  Durable Medical Equipment, Home Health Choice offered to:  Patient  DME Arranged:  Oxygen DME Agency:  [facility] Arranged:  RN, PT, OT [facility] Agency:  [facility]  Status of Service:  Completed, signed off  If discussed at [place] of Stay Meetings, dates discussed:    Additional Comments:  Leeroy Cha, RN 01/08/2018, 12:01 PM

## 2018-01-08 NOTE — Patient Instructions (Addendum)
Please use Oxygen 10 L of oxygen with rest and 15l/m with activity  We will set up DME to get this set up .  Palliative Care to see in Outpatient setting .  Home Health consulted for home evaluation for services  Continue on Stiolto 2 puffs daily .  Finish Ceftin and Prednisone as directed.  Follow up with Dr. Lake Bells in 2 weeks and As needed   Please contact office for sooner follow up if symptoms do not improve or worsen or seek emergency care

## 2018-01-09 ENCOUNTER — Telehealth: Payer: Self-pay

## 2018-01-09 ENCOUNTER — Telehealth: Payer: Self-pay | Admitting: *Deleted

## 2018-01-09 LAB — CULTURE, BLOOD (ROUTINE X 2): Culture: NO GROWTH

## 2018-01-09 NOTE — Progress Notes (Signed)
Reviewed, agree 

## 2018-01-09 NOTE — Telephone Encounter (Signed)
Transition Care Management Follow-up Telephone Call   Date discharged? 01/08/18   How have you been since you were released from the hospital? Spoke w/daughter Margarita Grizzle) she states dad is doing fine   Do you understand why you were in the hospital? YES   Do you understand the discharge instructions? YES   Where were you discharged to? Home   Items Reviewed:  Medications reviewed: YES  Allergies reviewed: YES  Dietary changes reviewed: YES  Referrals reviewed: YES, have appt w/cardiology 01/25/2018   Functional Questionnaire:   Activities of Daily Living (ADLs):   He states he are independent in the following: ambulation, bathing and hygiene, feeding, continence, grooming, toileting and dressing States he doesn't require assistance    Any transportation issues/concerns?: NO   Any patient concerns? NO   Confirmed importance and date/time of follow-up visits scheduled YES, appt 01/18/18  Provider Appointment booked with Dr. Quay Burow  Confirmed with patient if condition begins to worsen call PCP or go to the ER.  Patient was given the office number and encouraged to call back with question or concerns.  : YES

## 2018-01-09 NOTE — Telephone Encounter (Signed)
Message received from daughter requesting a follow up visit to be scheduled. Scheduled with Colletta Maryland NP for 01/10/18

## 2018-01-10 ENCOUNTER — Telehealth: Payer: Self-pay

## 2018-01-10 ENCOUNTER — Other Ambulatory Visit: Payer: Self-pay | Admitting: Internal Medicine

## 2018-01-10 ENCOUNTER — Other Ambulatory Visit: Payer: Medicare Other | Admitting: Nurse Practitioner

## 2018-01-10 DIAGNOSIS — J8410 Pulmonary fibrosis, unspecified: Secondary | ICD-10-CM

## 2018-01-10 DIAGNOSIS — J9611 Chronic respiratory failure with hypoxia: Secondary | ICD-10-CM

## 2018-01-10 LAB — CULTURE, BLOOD (ROUTINE X 2)
Culture: NO GROWTH
Special Requests: ADEQUATE

## 2018-01-10 NOTE — Progress Notes (Deleted)
[facility] Telephone: [phone] Fax: [phone]  PATIENT NAME: Bradley Bennett DOB: 10/13/1932 MRN: [id number]  PRIMARY CARE PROVIDER:   [name], MD  REFERRING PROVIDER:  [name], MD [place], [place] [address]  RESPONSIBLE PARTY:   Bradley Bennett (daughter) [phone]   HISTORY OF PRESENT ILLNESS:  Bradley Bennett is a 84 y.o. year old male with multiple medical problems including ***. Palliative Care was asked to help address goals of care.    ASSESSMENT/PLAN:  COPD/Pulmonary fibrosis Oxygen dependent chronic hypoxic resp failure    Parkinsons Dementia anxiety -follwed by Dr. Wells Guiles Tat   Hypothyroidism HTN HLD BPH    CAD s/p CABGx4;s/p stent    ACP      I spent *** minutes providing this consultation,  from *** to ***. More than 50% of the time in this consultation was spent coordinating communication.    CODE STATUS:   PPS: 0% HOSPICE ELIGIBILITY/DIAGNOSIS: TBD  PAST MEDICAL HISTORY:  Past Medical History:  Diagnosis Date  . Arthritis   . Asthma   . Coronary artery disease    ASCAD with MI in 1992 and subsequently underwent 4 vessel CABG in [place].  Since then he has had several PCIs with the last one being 2015.    . Emphysema of lung ([place])   . Hearing difficulty of both ears   . Hyperlipidemia   . Hypertension   . ILD (interstitial lung disease) ([place])   . Parkinson disease ([place])     SOCIAL HX:  Social History   Tobacco Use  . Smoking status: Former Smoker    Packs/day: 2.00    Years: 35.00    Pack years: 70.00    Last attempt to quit: 08/15/1985    Years since quitting: 32.4  . Smokeless tobacco: Never Used  . Tobacco comment: quit smoking in 1992  Substance Use Topics  . Alcohol use: Yes    Alcohol/week: 0.0 standard drinks    Comment: twice every 6 months    ALLERGIES:  Allergies  Allergen Reactions  . Isosorbide Other (See Comments)    Reaction:  Headaches      PERTINENT  MEDICATIONS:  Outpatient Encounter Medications as of 01/10/2018  Medication Sig  . acetaminophen (TYLENOL) 500 MG tablet Take 500-1,000 mg by mouth every 6 (six) hours as needed for mild pain, moderate pain, fever or headache.   Marland Kitchen aspirin EC 81 MG tablet Take 1 tablet (81 mg total) by mouth daily.  Marland Kitchen atorvastatin (LIPITOR) 20 MG tablet Take 1 tablet (20 mg total) by mouth daily at 6 PM.  . busPIRone (BUSPAR) 5 MG tablet Take 1 tablet (5 mg total) by mouth 2 (two) times daily.  . carbidopa-levodopa (SINEMET CR) 50-200 MG tablet Take 1 tablet by mouth at bedtime.  . carbidopa-levodopa (SINEMET IR) 25-100 MG tablet Take 2 tablets by mouth 3 (three) times daily.  . cefUROXime (CEFTIN) 250 MG tablet Take 1 tablet (250 mg total) by mouth 2 (two) times daily for 2 days.  . finasteride (PROSCAR) 5 MG tablet Take 1 tablet (5 mg total) by mouth every evening.  . gabapentin (NEURONTIN) 100 MG capsule TAKE ONE CAPSULE BY MOUTH AT 8AM AND ONE CAPSULE AT 4PM (Patient taking differently: Take 100 mg by mouth 2 (two) times daily. TAKE ONE CAPSULE BY MOUTH AT 8AM AND ONE CAPSULE AT 4PM)  . gabapentin (NEURONTIN) 400 MG capsule TAKE ONE CAPSULE BY MOUTH AT BEDTIME  . Ibuprofen-diphenhydrAMINE  HCl (ADVIL PM) 200-25 MG CAPS Take 2 tablets by mouth at bedtime as needed (sleep).  . lactose free nutrition (BOOST PLUS) LIQD Take 237 mLs by mouth 3 (three) times daily with meals.  Marland Kitchen levothyroxine (SYNTHROID, LEVOTHROID) 150 MCG tablet TAKE ONE TABLET BY MOUTH DAILY  . mirabegron ER (MYRBETRIQ) 50 MG TB24 tablet Take 50 mg by mouth daily.  . Multiple Vitamin (MULTIVITAMIN WITH MINERALS) TABS tablet Take 1 tablet by mouth daily at 12 noon.  . Multiple Vitamins-Minerals (ICAPS AREDS 2 PO) Take 1-2 tablets by mouth See admin instructions. 2 tablets @ noon and 1 tablet @dinner   . predniSONE (DELTASONE) 20 MG tablet Daily with breakfast: Take 3 tabs x 2 days, then 2 tabs x 3 days, then 1 tab x 3 days.  . sodium chloride (OCEAN)  0.65 % SOLN nasal spray Place 1 spray into both nostrils as needed for congestion.  Marland Kitchen Spacer/Aero-Holding Chambers (AEROCHAMBER MV) inhaler Use as instructed  . Tiotropium Bromide-Olodaterol (STIOLTO RESPIMAT) 2.5-2.5 MCG/ACT AERS Inhale 2 puffs into the lungs daily.  . traZODone (DESYREL) 100 MG tablet TAKE ONE TABLET BY MOUTH AT BEDTIME (Patient taking differently: Take 100 mg by mouth at bedtime. )  . trimethoprim (TRIMPEX) 100 MG tablet Take 100 mg by mouth at bedtime.    No facility-administered encounter medications on file as of 01/10/2018.     PHYSICAL EXAM:   General: NAD, frail appearing, thin Cardiovascular: regular rate and rhythm Pulmonary: clear ant fields Abdomen: soft, nontender, + bowel sounds GU: no suprapubic tenderness Extremities: no edema, no joint deformities Skin: no rashes Neurological: Weakness but otherwise nonfocal  Bradley [place] G [place], NP

## 2018-01-10 NOTE — Telephone Encounter (Signed)
At the direction of NP, message sent to PCP for an order for hospice eval

## 2018-01-12 DIAGNOSIS — N3289 Other specified disorders of bladder: Secondary | ICD-10-CM | POA: Diagnosis not present

## 2018-01-12 DIAGNOSIS — J8410 Pulmonary fibrosis, unspecified: Secondary | ICD-10-CM | POA: Diagnosis not present

## 2018-01-12 DIAGNOSIS — E039 Hypothyroidism, unspecified: Secondary | ICD-10-CM | POA: Diagnosis not present

## 2018-01-12 DIAGNOSIS — G20 Parkinson's disease: Secondary | ICD-10-CM | POA: Diagnosis not present

## 2018-01-12 DIAGNOSIS — I4901 Ventricular fibrillation: Secondary | ICD-10-CM | POA: Diagnosis not present

## 2018-01-12 DIAGNOSIS — R1310 Dysphagia, unspecified: Secondary | ICD-10-CM | POA: Diagnosis not present

## 2018-01-12 DIAGNOSIS — J449 Chronic obstructive pulmonary disease, unspecified: Secondary | ICD-10-CM | POA: Diagnosis not present

## 2018-01-12 DIAGNOSIS — E785 Hyperlipidemia, unspecified: Secondary | ICD-10-CM | POA: Diagnosis not present

## 2018-01-12 DIAGNOSIS — I2510 Atherosclerotic heart disease of native coronary artery without angina pectoris: Secondary | ICD-10-CM | POA: Diagnosis not present

## 2018-01-12 DIAGNOSIS — F339 Major depressive disorder, recurrent, unspecified: Secondary | ICD-10-CM | POA: Diagnosis not present

## 2018-01-12 DIAGNOSIS — N401 Enlarged prostate with lower urinary tract symptoms: Secondary | ICD-10-CM | POA: Diagnosis not present

## 2018-01-12 DIAGNOSIS — R630 Anorexia: Secondary | ICD-10-CM | POA: Diagnosis not present

## 2018-01-15 ENCOUNTER — Other Ambulatory Visit: Payer: Self-pay | Admitting: Pulmonary Disease

## 2018-01-15 DIAGNOSIS — J8410 Pulmonary fibrosis, unspecified: Secondary | ICD-10-CM | POA: Diagnosis not present

## 2018-01-15 DIAGNOSIS — R630 Anorexia: Secondary | ICD-10-CM | POA: Diagnosis not present

## 2018-01-15 DIAGNOSIS — R1310 Dysphagia, unspecified: Secondary | ICD-10-CM | POA: Diagnosis not present

## 2018-01-15 DIAGNOSIS — G20 Parkinson's disease: Secondary | ICD-10-CM | POA: Diagnosis not present

## 2018-01-15 DIAGNOSIS — I4901 Ventricular fibrillation: Secondary | ICD-10-CM | POA: Diagnosis not present

## 2018-01-15 DIAGNOSIS — J449 Chronic obstructive pulmonary disease, unspecified: Secondary | ICD-10-CM | POA: Diagnosis not present

## 2018-01-16 ENCOUNTER — Telehealth: Payer: Self-pay | Admitting: Internal Medicine

## 2018-01-16 NOTE — Telephone Encounter (Signed)
Patient's daughter states that the patient is now under the care of Hospice.

## 2018-01-17 DIAGNOSIS — R1310 Dysphagia, unspecified: Secondary | ICD-10-CM | POA: Diagnosis not present

## 2018-01-17 DIAGNOSIS — I4901 Ventricular fibrillation: Secondary | ICD-10-CM | POA: Diagnosis not present

## 2018-01-17 DIAGNOSIS — G20 Parkinson's disease: Secondary | ICD-10-CM | POA: Diagnosis not present

## 2018-01-17 DIAGNOSIS — R630 Anorexia: Secondary | ICD-10-CM | POA: Diagnosis not present

## 2018-01-17 DIAGNOSIS — J449 Chronic obstructive pulmonary disease, unspecified: Secondary | ICD-10-CM | POA: Diagnosis not present

## 2018-01-17 DIAGNOSIS — J8410 Pulmonary fibrosis, unspecified: Secondary | ICD-10-CM | POA: Diagnosis not present

## 2018-01-18 ENCOUNTER — Ambulatory Visit: Payer: Medicare Other | Admitting: Pulmonary Disease

## 2018-01-18 ENCOUNTER — Inpatient Hospital Stay: Payer: Medicare Other | Admitting: Internal Medicine

## 2018-01-22 ENCOUNTER — Ambulatory Visit: Payer: Medicare Other | Admitting: Pulmonary Disease

## 2018-01-23 DIAGNOSIS — J8410 Pulmonary fibrosis, unspecified: Secondary | ICD-10-CM | POA: Diagnosis not present

## 2018-01-23 DIAGNOSIS — R1310 Dysphagia, unspecified: Secondary | ICD-10-CM | POA: Diagnosis not present

## 2018-01-23 DIAGNOSIS — J449 Chronic obstructive pulmonary disease, unspecified: Secondary | ICD-10-CM | POA: Diagnosis not present

## 2018-01-23 DIAGNOSIS — I4901 Ventricular fibrillation: Secondary | ICD-10-CM | POA: Diagnosis not present

## 2018-01-23 DIAGNOSIS — R630 Anorexia: Secondary | ICD-10-CM | POA: Diagnosis not present

## 2018-01-23 DIAGNOSIS — G20 Parkinson's disease: Secondary | ICD-10-CM | POA: Diagnosis not present

## 2018-01-25 ENCOUNTER — Encounter: Payer: Self-pay | Admitting: Pulmonary Disease

## 2018-01-25 ENCOUNTER — Ambulatory Visit (INDEPENDENT_AMBULATORY_CARE_PROVIDER_SITE_OTHER): Admitting: Pulmonary Disease

## 2018-01-25 VITALS — BP 108/56 | HR 45 | Ht 70.0 in | Wt 176.0 lb

## 2018-01-25 DIAGNOSIS — J441 Chronic obstructive pulmonary disease with (acute) exacerbation: Secondary | ICD-10-CM | POA: Diagnosis not present

## 2018-01-25 DIAGNOSIS — J9611 Chronic respiratory failure with hypoxia: Secondary | ICD-10-CM | POA: Diagnosis not present

## 2018-01-25 DIAGNOSIS — G20 Parkinson's disease: Secondary | ICD-10-CM | POA: Diagnosis not present

## 2018-01-25 DIAGNOSIS — J8410 Pulmonary fibrosis, unspecified: Secondary | ICD-10-CM | POA: Diagnosis not present

## 2018-01-25 DIAGNOSIS — R630 Anorexia: Secondary | ICD-10-CM | POA: Diagnosis not present

## 2018-01-25 DIAGNOSIS — R1310 Dysphagia, unspecified: Secondary | ICD-10-CM | POA: Diagnosis not present

## 2018-01-25 DIAGNOSIS — J449 Chronic obstructive pulmonary disease, unspecified: Secondary | ICD-10-CM | POA: Diagnosis not present

## 2018-01-25 DIAGNOSIS — I4901 Ventricular fibrillation: Secondary | ICD-10-CM | POA: Diagnosis not present

## 2018-01-25 NOTE — Patient Instructions (Signed)
COPD: Continue Stiolto 2 puffs daily no matter how you feel Continue using albuterol as needed for chest tightness wheezing or shortness of breath  Chronic respiratory failure with hypoxemia: We will reach out to the [place] to see if they can supply you with a concentrator that goes to 15 L/min, if not then we will request that they send a second concentrator which can go to 10 L/min which can be tethered to your current concentrator. Keep using 10 to 15 L of oxygen continuously  Aspiration pneumonitis: Do your best to try to minimize aspiration, use swallowing techniques we have discussed in the past  We will see you back in 2 months or sooner if needed

## 2018-01-25 NOTE — Progress Notes (Signed)
[facility]  January 25, 2018  To Whom it May Concern:  I have been caring for Mr. Bradley Bennett for several years now.  He has advanced chronic respiratory failure with hypoxemia secondary to interstitial lung disease as well as centrilobular emphysema.  Over the last several months his respiratory failure has worsened to a severe degree.  He now requires 15 L of oxygen to maintain his O2 saturation within a normal range.  Despite this he is still able to walk around his apartment and have some quality of life.  However, because his current concentrator only goes to 10 L of oxygen his mobility is significantly limited.  My request is that your agency supply an O2 concentrator which go to 15 L/min.  If that is unable to be obtained then please supply a second concentrator which goes to 10 L/min as we have had success in the past tethering to concentrators to achieve adequate oxygenation for patients with severe interstitial fibrosis and chronic respiratory failure with hypoxemia.  Sincerely,   Dr. Norlene Campbell MD

## 2018-01-25 NOTE — Progress Notes (Signed)
Subjective:    Patient ID: Bradley Bennett, male    DOB: 01-20-1933, 84 y.o.   MRN: [id number]  Synopsis: On her fibrosis and possible COPD with a history of chronic aspiration in the setting of Parkinson's disease. Uses 3-4 L of oxygen continuously.  He was started on oxygen around 2013 after his diagnosis of COPD    in 2013.  He was diagnosed with an ILD around 73 in [place]. A modified barium swallow in 2015 showed aspiration.  He smoked 35 years, 2 ppd, quit around 1992.   HPI Chief Complaint  Patient presents with  . Follow-up    on hospice.  c/o sob with exertion,  minimally prod cough with clear mucus worse qam.     Bradley Bennett was put on hospice after recent hospitalization.  He says that he feels "great".  However, his daughter says that things are not good.  She says that he struggles with shortness of breath and his activity level has continued to decrease.  They have been feeling tethered to his apartment lately because of his oxygen needs.  On hospice they are getting fairly decent support but they are looking for ways to increase the total amount of oxygen that he receives.  Specifically they are hoping that they can get a second concentrator for him so that his oxygen can go up to 15 L in the apartment when he exerts himself.  The only way they are able to accommodate that at this point is with the use of tanks which burn through quite quickly.  They have been requesting this from the [place] who supplies his oxygen but as of yet they have not been able to do that.  An ideal scenario for him would be to have a concentrator which goes up to 15 L.  In lieu of that tethering to concentrators which go up to 10 L together may be helpful.  Past Medical History:  Diagnosis Date  . Arthritis   . Asthma   . Coronary artery disease    ASCAD with MI in 1992 and subsequently underwent 4 vessel CABG in [place].  Since then he has had several PCIs with the last one being 2015.    . Emphysema of  lung ([place])   . Hearing difficulty of both ears   . Hyperlipidemia   . Hypertension   . ILD (interstitial lung disease) ([place])   . Parkinson disease ([place])          Review of Systems  Constitutional: Positive for fatigue. Negative for chills and fever.  HENT: Positive for postnasal drip, rhinorrhea and sinus pressure. Negative for sinus pain.   Respiratory: Positive for cough and shortness of breath. Negative for wheezing.   Cardiovascular: Negative for chest pain, palpitations and leg swelling.  Gastrointestinal: Negative for abdominal distention, constipation, diarrhea and nausea.  Musculoskeletal: Negative for arthralgias, gait problem, joint swelling and neck stiffness.  Neurological: Negative for seizures, speech difficulty, light-headedness, numbness and headaches.       Objective:   Physical Exam Vitals:   01/25/18 0924  BP: (!) 108/56  Pulse: (!) 45  SpO2: 90%  Weight: 176 lb (79.8 kg)  Height: 5\' 10"  (1.778 m)   15 L continuous  Of note: Forehead probe measured at 97% when finger probe measured 92%  Gen: chronically appearing HENT: OP clear, neck supple PULM: Crackles B, normal percussion CV: RRR, no mgr, trace edema GI: BS+, soft, nontender Derm: no cyanosis or rash Psyche: normal mood  and affect   BMET    Component Value Date/Time   NA 139 01/07/2018 0758   K 4.3 01/07/2018 0758   CL 101 01/07/2018 0758   CO2 30 01/07/2018 0758   GLUCOSE 87 01/07/2018 0758   BUN 16 01/07/2018 0758   CREATININE 0.68 01/07/2018 0758   CALCIUM 8.5 (L) 01/07/2018 0758   GFRNONAA >60 01/07/2018 0758   GFRAA >60 01/07/2018 0758     Records from his visit with neurology reviewed where he had his Parkinson's medications adjusted.    Assessment & Plan:  Pulmonary fibrosis (HCC)  Postinflammatory pulmonary fibrosis (HCC)  Chronic obstructive pulmonary disease with acute exacerbation (HCC)  Chronic respiratory failure with hypoxia [facility])  Discussion: Bradley Bennett  is now on hospice which is reasonable considering his advanced chronic lung disease with hypoxemia.  At this point we need to try to get a second concentrator for him so that he can be more mobile in the home.  I do not want to switch him off of Stiolto because it is quite simple for him to use and provides good benefit to him.  Plan: COPD: Continue Stiolto 2 puffs daily no matter how you feel Continue using albuterol as needed for chest tightness wheezing or shortness of breath  Chronic respiratory failure with hypoxemia: We will reach out to the [place] to see if they can supply you with a concentrator that goes to 15 L/min, if not then we will request that they send a second concentrator which can go to 10 L/min which can be tethered to your current concentrator. Keep using 10 to 15 L of oxygen continuously  Aspiration pneumonitis: Do your best to try to minimize aspiration, use swallowing techniques we have discussed in the past  We will see you back in 2 months or sooner if needed  > 50% of this 30 min visit spent face to face    Current Outpatient Medications:  .  acetaminophen (TYLENOL) 500 MG tablet, Take 500-1,000 mg by mouth every 6 (six) hours as needed for mild pain, moderate pain, fever or headache. , Disp: , Rfl:  .  aspirin EC 81 MG tablet, Take 1 tablet (81 mg total) by mouth daily., Disp: 90 tablet, Rfl: 3 .  atorvastatin (LIPITOR) 20 MG tablet, Take 1 tablet (20 mg total) by mouth daily at 6 PM., Disp: 90 tablet, Rfl: 1 .  busPIRone (BUSPAR) 5 MG tablet, Take 1 tablet (5 mg total) by mouth 2 (two) times daily., Disp: 180 tablet, Rfl: 1 .  carbidopa-levodopa (SINEMET CR) 50-200 MG tablet, Take 1 tablet by mouth at bedtime., Disp: 90 tablet, Rfl: 1 .  carbidopa-levodopa (SINEMET IR) 25-100 MG tablet, Take 2 tablets by mouth 3 (three) times daily., Disp: 540 tablet, Rfl: 1 .  finasteride (PROSCAR) 5 MG tablet, Take 1 tablet (5 mg total) by mouth every evening., Disp: , Rfl:  .   gabapentin (NEURONTIN) 100 MG capsule, TAKE ONE CAPSULE BY MOUTH AT 8AM AND ONE CAPSULE AT 4PM (Patient taking differently: Take 100 mg by mouth 2 (two) times daily. TAKE ONE CAPSULE BY MOUTH AT 8AM AND ONE CAPSULE AT 4PM), Disp: 180 capsule, Rfl: 1 .  gabapentin (NEURONTIN) 400 MG capsule, TAKE ONE CAPSULE BY MOUTH AT BEDTIME, Disp: 90 capsule, Rfl: 1 .  Ibuprofen-diphenhydrAMINE HCl (ADVIL PM) 200-25 MG CAPS, Take 2 tablets by mouth at bedtime as needed (sleep)., Disp: , Rfl:  .  lactose free nutrition (BOOST PLUS) LIQD, Take 237 mLs by mouth  3 (three) times daily with meals., Disp: 30 Can, Rfl: 0 .  levothyroxine (SYNTHROID, LEVOTHROID) 150 MCG tablet, TAKE ONE TABLET BY MOUTH DAILY, Disp: 90 tablet, Rfl: 1 .  mirabegron ER (MYRBETRIQ) 50 MG TB24 tablet, Take 50 mg by mouth daily., Disp: , Rfl:  .  Multiple Vitamin (MULTIVITAMIN WITH MINERALS) TABS tablet, Take 1 tablet by mouth daily at 12 noon., Disp: , Rfl:  .  Multiple Vitamins-Minerals (ICAPS AREDS 2 PO), Take 1-2 tablets by mouth See admin instructions. 2 tablets @ noon and 1 tablet @dinner , Disp: , Rfl:  .  sodium chloride (OCEAN) 0.65 % SOLN nasal spray, Place 1 spray into both nostrils as needed for congestion., Disp: 1 Bottle, Rfl: 2 .  Spacer/Aero-Holding Chambers (AEROCHAMBER MV) inhaler, Use as instructed, Disp: 1 each, Rfl: 0 .  Tiotropium Bromide-Olodaterol (STIOLTO RESPIMAT) 2.5-2.5 MCG/ACT AERS, Inhale 2 puffs into the lungs daily., Disp: 3 Inhaler, Rfl: 1 .  traZODone (DESYREL) 100 MG tablet, TAKE ONE TABLET BY MOUTH AT BEDTIME, Disp: 90 tablet, Rfl: 0 .  trimethoprim (TRIMPEX) 100 MG tablet, Take 100 mg by mouth at bedtime. , Disp: , Rfl: 11

## 2018-01-28 DIAGNOSIS — I4901 Ventricular fibrillation: Secondary | ICD-10-CM | POA: Diagnosis not present

## 2018-01-28 DIAGNOSIS — J449 Chronic obstructive pulmonary disease, unspecified: Secondary | ICD-10-CM | POA: Diagnosis not present

## 2018-01-28 DIAGNOSIS — R1310 Dysphagia, unspecified: Secondary | ICD-10-CM | POA: Diagnosis not present

## 2018-01-28 DIAGNOSIS — J8410 Pulmonary fibrosis, unspecified: Secondary | ICD-10-CM | POA: Diagnosis not present

## 2018-01-28 DIAGNOSIS — G20 Parkinson's disease: Secondary | ICD-10-CM | POA: Diagnosis not present

## 2018-01-28 DIAGNOSIS — R630 Anorexia: Secondary | ICD-10-CM | POA: Diagnosis not present

## 2018-01-31 ENCOUNTER — Telehealth: Payer: Self-pay | Admitting: Pulmonary Disease

## 2018-01-31 ENCOUNTER — Other Ambulatory Visit: Payer: Self-pay | Admitting: Internal Medicine

## 2018-01-31 DIAGNOSIS — R630 Anorexia: Secondary | ICD-10-CM | POA: Diagnosis not present

## 2018-01-31 DIAGNOSIS — G20 Parkinson's disease: Secondary | ICD-10-CM | POA: Diagnosis not present

## 2018-01-31 DIAGNOSIS — J8410 Pulmonary fibrosis, unspecified: Secondary | ICD-10-CM | POA: Diagnosis not present

## 2018-01-31 DIAGNOSIS — R1310 Dysphagia, unspecified: Secondary | ICD-10-CM | POA: Diagnosis not present

## 2018-01-31 DIAGNOSIS — J449 Chronic obstructive pulmonary disease, unspecified: Secondary | ICD-10-CM | POA: Diagnosis not present

## 2018-01-31 DIAGNOSIS — I4901 Ventricular fibrillation: Secondary | ICD-10-CM | POA: Diagnosis not present

## 2018-01-31 NOTE — Telephone Encounter (Signed)
Spoke with Ria Comment and advised her that I would fax over the office notes. Faxed and confirmation received. Nothing further is needed.

## 2018-02-01 ENCOUNTER — Telehealth: Payer: Self-pay | Admitting: Pulmonary Disease

## 2018-02-01 DIAGNOSIS — J449 Chronic obstructive pulmonary disease, unspecified: Secondary | ICD-10-CM | POA: Diagnosis not present

## 2018-02-01 DIAGNOSIS — J8410 Pulmonary fibrosis, unspecified: Secondary | ICD-10-CM | POA: Diagnosis not present

## 2018-02-01 DIAGNOSIS — I4901 Ventricular fibrillation: Secondary | ICD-10-CM | POA: Diagnosis not present

## 2018-02-01 DIAGNOSIS — R630 Anorexia: Secondary | ICD-10-CM | POA: Diagnosis not present

## 2018-02-01 DIAGNOSIS — R1310 Dysphagia, unspecified: Secondary | ICD-10-CM | POA: Diagnosis not present

## 2018-02-01 DIAGNOSIS — G20 Parkinson's disease: Secondary | ICD-10-CM | POA: Diagnosis not present

## 2018-02-01 NOTE — Telephone Encounter (Signed)
Attempted to call patient today regarding O2 concentrator not rec'd at this time. I did not receive an answer at time of call. I have left a voicemail message for pt to return call. X1

## 2018-02-04 DIAGNOSIS — R630 Anorexia: Secondary | ICD-10-CM | POA: Diagnosis not present

## 2018-02-04 DIAGNOSIS — J8410 Pulmonary fibrosis, unspecified: Secondary | ICD-10-CM | POA: Diagnosis not present

## 2018-02-04 DIAGNOSIS — I4901 Ventricular fibrillation: Secondary | ICD-10-CM | POA: Diagnosis not present

## 2018-02-04 DIAGNOSIS — R1310 Dysphagia, unspecified: Secondary | ICD-10-CM | POA: Diagnosis not present

## 2018-02-04 DIAGNOSIS — J449 Chronic obstructive pulmonary disease, unspecified: Secondary | ICD-10-CM | POA: Diagnosis not present

## 2018-02-04 DIAGNOSIS — G20 Parkinson's disease: Secondary | ICD-10-CM | POA: Diagnosis not present

## 2018-02-04 NOTE — Telephone Encounter (Signed)
Bradley Bennett called back. Clarified that they are requesting O2 concentrator for home.  Per BQ OV 01/25/18- Chronic respiratory failure with hypoxemia: We will reach out to the [place] to see if they can supply you with a concentrator that goes to 15 L/min, if not then we will request that they send a second concentrator which can go to 10 L/min which can be tethered to your current concentrator. Keep using 10 to 15 L of oxygen continuously Called and spoke with [place], [place].  They do not tether concentrators anymore because of back flow and it is not guaranteed that the Patient is getting the required O2.   Returned phone call to [place].    Explained what [place], [place] said about tethering 2 O2 concentrators.  I suggested possibly using another DME company. She stated that the Patient was also on hospice, and she wanted to avoid using another company for services. Bradley Bennett asked if there is anything TP or Dr. Lake Bells can do? Explained Wyn Quaker was DOD. Understanding stated, and was open to suggestions.  Will route message to Wyn Quaker, NP-DOD Please advise any suggestions

## 2018-02-04 NOTE — Telephone Encounter (Signed)
Called and spoke with patient's daughter, Bradley Bennett.  She  Stated that Dr. Lake Bells wrote a letter to the [facility] for the Patient to have a second O2 concentrator, and they have not heard anything back.  Called and spoke with [place], [place], [place], ext [phone].  She stated that the Patient wants a POC and does not qualify for POC.  She stated that his sats were on 15L, at 157ft-88-90%, and on 10L at 113ft- 90%. They do not have a concentrator that goes up to 10L. Called Bradley Bennett back, left message for her to call back.

## 2018-02-04 NOTE — Telephone Encounter (Signed)
Patient's daughter, Margarita Grizzle, returned call.  CB is [phone].

## 2018-02-04 NOTE — Telephone Encounter (Signed)
Attempted to call Patient.  Left message on VM to call back concerning concentrator.

## 2018-02-04 NOTE — Telephone Encounter (Signed)
Called and spoke with pt's daughter Margarita Grizzle and stated to her the information per Wyn Quaker, NP.  Margarita Grizzle stated that she would contact hospice to see if they can help pt get another concentrator. When I stated to Margarita Grizzle that the other option is to switch DMEs, Margarita Grizzle stated that the [place] is the only one that has the liquid O2 that pt is on and due to this, they do not want to switch DMEs. I stated to Margarita Grizzle to call us if anything else was needed.

## 2018-02-04 NOTE — Telephone Encounter (Signed)
I am unsure what else to suggest if they are unwilling to switch DME companies to receive a oxygen concentrator that goes up to 15L's.  If the [place] cannot coordinate then their options in my opinion would to be to set up with a different DME company.  Have they tried contacting hospice to see if hospice can help coordinate?  Wyn Quaker, FNP

## 2018-02-06 DIAGNOSIS — I2510 Atherosclerotic heart disease of native coronary artery without angina pectoris: Secondary | ICD-10-CM | POA: Diagnosis not present

## 2018-02-06 DIAGNOSIS — R630 Anorexia: Secondary | ICD-10-CM | POA: Diagnosis not present

## 2018-02-06 DIAGNOSIS — N401 Enlarged prostate with lower urinary tract symptoms: Secondary | ICD-10-CM | POA: Diagnosis not present

## 2018-02-06 DIAGNOSIS — I4901 Ventricular fibrillation: Secondary | ICD-10-CM | POA: Diagnosis not present

## 2018-02-06 DIAGNOSIS — E785 Hyperlipidemia, unspecified: Secondary | ICD-10-CM | POA: Diagnosis not present

## 2018-02-06 DIAGNOSIS — J449 Chronic obstructive pulmonary disease, unspecified: Secondary | ICD-10-CM | POA: Diagnosis not present

## 2018-02-06 DIAGNOSIS — N3289 Other specified disorders of bladder: Secondary | ICD-10-CM | POA: Diagnosis not present

## 2018-02-06 DIAGNOSIS — F339 Major depressive disorder, recurrent, unspecified: Secondary | ICD-10-CM | POA: Diagnosis not present

## 2018-02-06 DIAGNOSIS — J8410 Pulmonary fibrosis, unspecified: Secondary | ICD-10-CM | POA: Diagnosis not present

## 2018-02-06 DIAGNOSIS — E039 Hypothyroidism, unspecified: Secondary | ICD-10-CM | POA: Diagnosis not present

## 2018-02-06 DIAGNOSIS — G20 Parkinson's disease: Secondary | ICD-10-CM | POA: Diagnosis not present

## 2018-02-06 DIAGNOSIS — R1310 Dysphagia, unspecified: Secondary | ICD-10-CM | POA: Diagnosis not present

## 2018-02-07 DIAGNOSIS — J449 Chronic obstructive pulmonary disease, unspecified: Secondary | ICD-10-CM | POA: Diagnosis not present

## 2018-02-07 DIAGNOSIS — J8410 Pulmonary fibrosis, unspecified: Secondary | ICD-10-CM | POA: Diagnosis not present

## 2018-02-07 DIAGNOSIS — G20 Parkinson's disease: Secondary | ICD-10-CM | POA: Diagnosis not present

## 2018-02-07 DIAGNOSIS — R630 Anorexia: Secondary | ICD-10-CM | POA: Diagnosis not present

## 2018-02-07 DIAGNOSIS — R1310 Dysphagia, unspecified: Secondary | ICD-10-CM | POA: Diagnosis not present

## 2018-02-07 DIAGNOSIS — I4901 Ventricular fibrillation: Secondary | ICD-10-CM | POA: Diagnosis not present

## 2018-02-14 DIAGNOSIS — J449 Chronic obstructive pulmonary disease, unspecified: Secondary | ICD-10-CM | POA: Diagnosis not present

## 2018-02-14 DIAGNOSIS — G20 Parkinson's disease: Secondary | ICD-10-CM | POA: Diagnosis not present

## 2018-02-14 DIAGNOSIS — I4901 Ventricular fibrillation: Secondary | ICD-10-CM | POA: Diagnosis not present

## 2018-02-14 DIAGNOSIS — J8410 Pulmonary fibrosis, unspecified: Secondary | ICD-10-CM | POA: Diagnosis not present

## 2018-02-14 DIAGNOSIS — R630 Anorexia: Secondary | ICD-10-CM | POA: Diagnosis not present

## 2018-02-14 DIAGNOSIS — R1310 Dysphagia, unspecified: Secondary | ICD-10-CM | POA: Diagnosis not present

## 2018-02-21 DIAGNOSIS — J8410 Pulmonary fibrosis, unspecified: Secondary | ICD-10-CM | POA: Diagnosis not present

## 2018-02-21 DIAGNOSIS — R630 Anorexia: Secondary | ICD-10-CM | POA: Diagnosis not present

## 2018-02-21 DIAGNOSIS — R1310 Dysphagia, unspecified: Secondary | ICD-10-CM | POA: Diagnosis not present

## 2018-02-21 DIAGNOSIS — I4901 Ventricular fibrillation: Secondary | ICD-10-CM | POA: Diagnosis not present

## 2018-02-21 DIAGNOSIS — J449 Chronic obstructive pulmonary disease, unspecified: Secondary | ICD-10-CM | POA: Diagnosis not present

## 2018-02-21 DIAGNOSIS — G20 Parkinson's disease: Secondary | ICD-10-CM | POA: Diagnosis not present

## 2018-02-22 ENCOUNTER — Ambulatory Visit: Payer: Medicare Other | Admitting: Internal Medicine

## 2018-02-28 DIAGNOSIS — R630 Anorexia: Secondary | ICD-10-CM | POA: Diagnosis not present

## 2018-02-28 DIAGNOSIS — I4901 Ventricular fibrillation: Secondary | ICD-10-CM | POA: Diagnosis not present

## 2018-02-28 DIAGNOSIS — J8410 Pulmonary fibrosis, unspecified: Secondary | ICD-10-CM | POA: Diagnosis not present

## 2018-02-28 DIAGNOSIS — R1310 Dysphagia, unspecified: Secondary | ICD-10-CM | POA: Diagnosis not present

## 2018-02-28 DIAGNOSIS — G20 Parkinson's disease: Secondary | ICD-10-CM | POA: Diagnosis not present

## 2018-02-28 DIAGNOSIS — J449 Chronic obstructive pulmonary disease, unspecified: Secondary | ICD-10-CM | POA: Diagnosis not present

## 2018-03-05 DIAGNOSIS — R630 Anorexia: Secondary | ICD-10-CM | POA: Diagnosis not present

## 2018-03-05 DIAGNOSIS — G20 Parkinson's disease: Secondary | ICD-10-CM | POA: Diagnosis not present

## 2018-03-05 DIAGNOSIS — R1310 Dysphagia, unspecified: Secondary | ICD-10-CM | POA: Diagnosis not present

## 2018-03-05 DIAGNOSIS — J8410 Pulmonary fibrosis, unspecified: Secondary | ICD-10-CM | POA: Diagnosis not present

## 2018-03-05 DIAGNOSIS — J449 Chronic obstructive pulmonary disease, unspecified: Secondary | ICD-10-CM | POA: Diagnosis not present

## 2018-03-05 DIAGNOSIS — I4901 Ventricular fibrillation: Secondary | ICD-10-CM | POA: Diagnosis not present

## 2018-03-07 DIAGNOSIS — G20 Parkinson's disease: Secondary | ICD-10-CM | POA: Diagnosis not present

## 2018-03-07 DIAGNOSIS — R1310 Dysphagia, unspecified: Secondary | ICD-10-CM | POA: Diagnosis not present

## 2018-03-07 DIAGNOSIS — J8410 Pulmonary fibrosis, unspecified: Secondary | ICD-10-CM | POA: Diagnosis not present

## 2018-03-07 DIAGNOSIS — I4901 Ventricular fibrillation: Secondary | ICD-10-CM | POA: Diagnosis not present

## 2018-03-07 DIAGNOSIS — J449 Chronic obstructive pulmonary disease, unspecified: Secondary | ICD-10-CM | POA: Diagnosis not present

## 2018-03-07 DIAGNOSIS — R630 Anorexia: Secondary | ICD-10-CM | POA: Diagnosis not present

## 2018-03-09 DIAGNOSIS — R1310 Dysphagia, unspecified: Secondary | ICD-10-CM | POA: Diagnosis not present

## 2018-03-09 DIAGNOSIS — N3289 Other specified disorders of bladder: Secondary | ICD-10-CM | POA: Diagnosis not present

## 2018-03-09 DIAGNOSIS — I2510 Atherosclerotic heart disease of native coronary artery without angina pectoris: Secondary | ICD-10-CM | POA: Diagnosis not present

## 2018-03-09 DIAGNOSIS — G20 Parkinson's disease: Secondary | ICD-10-CM | POA: Diagnosis not present

## 2018-03-09 DIAGNOSIS — N401 Enlarged prostate with lower urinary tract symptoms: Secondary | ICD-10-CM | POA: Diagnosis not present

## 2018-03-09 DIAGNOSIS — E039 Hypothyroidism, unspecified: Secondary | ICD-10-CM | POA: Diagnosis not present

## 2018-03-09 DIAGNOSIS — J8410 Pulmonary fibrosis, unspecified: Secondary | ICD-10-CM | POA: Diagnosis not present

## 2018-03-09 DIAGNOSIS — J449 Chronic obstructive pulmonary disease, unspecified: Secondary | ICD-10-CM | POA: Diagnosis not present

## 2018-03-09 DIAGNOSIS — I4901 Ventricular fibrillation: Secondary | ICD-10-CM | POA: Diagnosis not present

## 2018-03-09 DIAGNOSIS — E785 Hyperlipidemia, unspecified: Secondary | ICD-10-CM | POA: Diagnosis not present

## 2018-03-09 DIAGNOSIS — F339 Major depressive disorder, recurrent, unspecified: Secondary | ICD-10-CM | POA: Diagnosis not present

## 2018-03-09 DIAGNOSIS — R630 Anorexia: Secondary | ICD-10-CM | POA: Diagnosis not present

## 2018-03-14 ENCOUNTER — Other Ambulatory Visit: Payer: Self-pay | Admitting: Neurology

## 2018-03-14 ENCOUNTER — Other Ambulatory Visit: Payer: Self-pay

## 2018-03-14 ENCOUNTER — Other Ambulatory Visit: Payer: Self-pay | Admitting: Cardiology

## 2018-03-14 DIAGNOSIS — I4901 Ventricular fibrillation: Secondary | ICD-10-CM | POA: Diagnosis not present

## 2018-03-14 DIAGNOSIS — R1310 Dysphagia, unspecified: Secondary | ICD-10-CM | POA: Diagnosis not present

## 2018-03-14 DIAGNOSIS — J8410 Pulmonary fibrosis, unspecified: Secondary | ICD-10-CM | POA: Diagnosis not present

## 2018-03-14 DIAGNOSIS — R630 Anorexia: Secondary | ICD-10-CM | POA: Diagnosis not present

## 2018-03-14 DIAGNOSIS — J449 Chronic obstructive pulmonary disease, unspecified: Secondary | ICD-10-CM | POA: Diagnosis not present

## 2018-03-14 DIAGNOSIS — G20 Parkinson's disease: Secondary | ICD-10-CM | POA: Diagnosis not present

## 2018-03-14 MED ORDER — GABAPENTIN 100 MG PO CAPS: ORAL_CAPSULE | ORAL | 0 refills | Status: AC

## 2018-03-14 MED ORDER — CARBIDOPA-LEVODOPA ER 50-200 MG PO TBCR: 1.0000 | EXTENDED_RELEASE_TABLET | Freq: Every day | ORAL | 1 refills | Status: AC

## 2018-03-14 MED ORDER — BUSPIRONE HCL 5 MG PO TABS: ORAL_TABLET | ORAL | 0 refills | Status: AC

## 2018-03-14 MED ORDER — CARBIDOPA-LEVODOPA 25-100 MG PO TABS: 2.0000 | ORAL_TABLET | Freq: Three times a day (TID) | ORAL | 1 refills | Status: AC

## 2018-03-14 MED ORDER — ATORVASTATIN CALCIUM 20 MG PO TABS: 20.0000 mg | ORAL_TABLET | Freq: Every day | ORAL | 0 refills | Status: AC

## 2018-03-14 MED ORDER — GABAPENTIN 400 MG PO CAPS: 400.0000 mg | ORAL_CAPSULE | Freq: Every day | ORAL | 0 refills | Status: DC

## 2018-03-14 MED ORDER — LEVOTHYROXINE SODIUM 150 MCG PO TABS: 150.0000 ug | ORAL_TABLET | Freq: Every day | ORAL | 0 refills | Status: DC

## 2018-03-15 DIAGNOSIS — R1310 Dysphagia, unspecified: Secondary | ICD-10-CM | POA: Diagnosis not present

## 2018-03-15 DIAGNOSIS — J8410 Pulmonary fibrosis, unspecified: Secondary | ICD-10-CM | POA: Diagnosis not present

## 2018-03-15 DIAGNOSIS — R630 Anorexia: Secondary | ICD-10-CM | POA: Diagnosis not present

## 2018-03-15 DIAGNOSIS — G20 Parkinson's disease: Secondary | ICD-10-CM | POA: Diagnosis not present

## 2018-03-15 DIAGNOSIS — I4901 Ventricular fibrillation: Secondary | ICD-10-CM | POA: Diagnosis not present

## 2018-03-15 DIAGNOSIS — J449 Chronic obstructive pulmonary disease, unspecified: Secondary | ICD-10-CM | POA: Diagnosis not present

## 2018-03-21 DIAGNOSIS — R630 Anorexia: Secondary | ICD-10-CM | POA: Diagnosis not present

## 2018-03-21 DIAGNOSIS — R1310 Dysphagia, unspecified: Secondary | ICD-10-CM | POA: Diagnosis not present

## 2018-03-21 DIAGNOSIS — G20 Parkinson's disease: Secondary | ICD-10-CM | POA: Diagnosis not present

## 2018-03-21 DIAGNOSIS — I4901 Ventricular fibrillation: Secondary | ICD-10-CM | POA: Diagnosis not present

## 2018-03-21 DIAGNOSIS — J449 Chronic obstructive pulmonary disease, unspecified: Secondary | ICD-10-CM | POA: Diagnosis not present

## 2018-03-21 DIAGNOSIS — J8410 Pulmonary fibrosis, unspecified: Secondary | ICD-10-CM | POA: Diagnosis not present

## 2018-03-25 DIAGNOSIS — G20 Parkinson's disease: Secondary | ICD-10-CM | POA: Diagnosis not present

## 2018-03-25 DIAGNOSIS — J449 Chronic obstructive pulmonary disease, unspecified: Secondary | ICD-10-CM | POA: Diagnosis not present

## 2018-03-25 DIAGNOSIS — R1310 Dysphagia, unspecified: Secondary | ICD-10-CM | POA: Diagnosis not present

## 2018-03-25 DIAGNOSIS — R630 Anorexia: Secondary | ICD-10-CM | POA: Diagnosis not present

## 2018-03-25 DIAGNOSIS — J8410 Pulmonary fibrosis, unspecified: Secondary | ICD-10-CM | POA: Diagnosis not present

## 2018-03-25 DIAGNOSIS — I4901 Ventricular fibrillation: Secondary | ICD-10-CM | POA: Diagnosis not present

## 2018-03-26 ENCOUNTER — Telehealth: Payer: Self-pay

## 2018-03-26 MED ORDER — TRAZODONE HCL 100 MG PO TABS: 100.0000 mg | ORAL_TABLET | Freq: Every day | ORAL | 0 refills | Status: AC

## 2018-03-26 NOTE — Telephone Encounter (Signed)
Paperwork has been filled out and faxed

## 2018-03-26 NOTE — Telephone Encounter (Signed)
Copied from [place] [id number]. Topic: General - Other >> Mar 26, 2018  2:41 PM Oneta Rack wrote: Osvaldo Human name: Tanika  Relation to pt: [employer] Clinical Appeal  Call back number: [phone] fax # [phone] case # [id number]    Reason for call:  Checking on the status of 2nd fax regarding clinical PA questions for busPIRone (BUSPAR) 5 MG tablet , forms faxed to [phone], please advise

## 2018-03-28 DIAGNOSIS — R630 Anorexia: Secondary | ICD-10-CM | POA: Diagnosis not present

## 2018-03-28 DIAGNOSIS — J449 Chronic obstructive pulmonary disease, unspecified: Secondary | ICD-10-CM | POA: Diagnosis not present

## 2018-03-28 DIAGNOSIS — I4901 Ventricular fibrillation: Secondary | ICD-10-CM | POA: Diagnosis not present

## 2018-03-28 DIAGNOSIS — J8410 Pulmonary fibrosis, unspecified: Secondary | ICD-10-CM | POA: Diagnosis not present

## 2018-03-28 DIAGNOSIS — G20 Parkinson's disease: Secondary | ICD-10-CM | POA: Diagnosis not present

## 2018-03-28 DIAGNOSIS — R1310 Dysphagia, unspecified: Secondary | ICD-10-CM | POA: Diagnosis not present

## 2018-04-02 ENCOUNTER — Telehealth: Payer: Self-pay

## 2018-04-02 ENCOUNTER — Telehealth: Payer: Self-pay | Admitting: Pulmonary Disease

## 2018-04-02 NOTE — Telephone Encounter (Signed)
Called and spoke with [place], Hospice.  She stated that medications lorazepam, and morphine have been ordered. She stated that she goes 1-2 times per week to see Patient.  She stated that Dr Lyman Speller is going out to see Patient is a few weeks as part of Medicare.  Dr Anastasia Pall message was read to [place]. Ria Comment is wanting to know reason Dr Lyman Speller needs to visit with Patient.   Message routed to Dr Lake Bells

## 2018-04-02 NOTE — Telephone Encounter (Signed)
I spoke to Bradley Bennett about her dad's condition today.  I believe he needs lorazepam for anxiety relief and morphine for relief of dyspnea.  I would like for this to be coordinated through hospice of [place].   Plan: Triage, please contact [place] and tell them that I would like to start using morphine 2-4mg  po q3h prn dyspnea and ativan 0.5-1mg  po q8h prn anxiety.

## 2018-04-02 NOTE — Telephone Encounter (Signed)
Bradley Doom, MD       12:42 PM  Note    I spoke to Bradley Bennett about her dad's condition today.  I believe he needs lorazepam for anxiety relief and morphine for relief of dyspnea.  I would like for this to be coordinated through hospice of [place].   Plan: Triage, please contact Bradley Bennett and tell them that I would like to start using morphine 2-4mg  po q3h prn dyspnea and ativan 0.5-1mg  po q8h prn anxiety.         Advised Bradley Bennett of the convo above. She stated that the daughter mentioned that BQ requested that a hospice doctor visit the patient. Per their guidelines, hospice doctors only visit during the recertification process.   BQ, please advise if you requested a hospice doctor to visit the patient. Thanks!

## 2018-04-02 NOTE — Telephone Encounter (Signed)
Received the following message from patient's daughter.   BQ, please advise. Thanks!   ----- Message -----  From: Reche Dixon  Sent: 2/24/20209:20 PM EST  To: Simonne Maffucci, MD Subject: Non-Urgent Medical Question  Hi Dr Lake Bells, I had to cancel my dads follow up appointment with you because he has not left his home since our last visit with you 2 months ago.It is very hard for him to even stand up, let alone walk a few feet.His oxygen drops to the low 60's in no time.His pulse goes way up when his oxygen drops and I realize this puts him at high risk for a heart attack or stroke.He is currently at 16 - 17 liters of oxygen at rest with two concentrators tethered together and he stays in the mid to high 80's,if he talks it drops. He gets increasing more and more anxious because it is so challenging to breath, especially through his nose.His sinuses have holes burned in them from the constant high rate of oxygen and I realize there is nothing short of surgery that can fix this. We use the AYR gel, but it doesn't seem to help.If you have any suggestions to help with the anxiety or breathing I would appreciate it. If there isn't anything else, I understand that too.Thank you, Margarita Grizzle

## 2018-04-02 NOTE — Telephone Encounter (Signed)
Note    Spoke with Sherri and gave a verbal medication order for Ativan. She stated I had to e-scribe the Morphine so BQ would have to use the system the physicians use for controlled substances. She said it would be best to send in the liquid version 20mg /ml 2-5mg  as needed. Dr. Lake Bells can you please send in, pharmacy is correct.

## 2018-04-02 NOTE — Telephone Encounter (Signed)
That would be best if they could offer it.  If not then see if we can place a palliative care consultation to have someone go out to see him.

## 2018-04-02 NOTE — Telephone Encounter (Addendum)
Spoke with Sherri and gave a verbal medication order for Ativan. She stated I had to e-scribe the Morphine so BQ would have to use the system the physicians use for controlled substances. She said it would be best to send in the liquid version 20mg /ml 2-5mg  as needed. Dr. Lake Bells can you please send in, pharmacy is correct.

## 2018-04-03 ENCOUNTER — Ambulatory Visit: Admitting: Pulmonary Disease

## 2018-04-03 DIAGNOSIS — R630 Anorexia: Secondary | ICD-10-CM | POA: Diagnosis not present

## 2018-04-03 DIAGNOSIS — J449 Chronic obstructive pulmonary disease, unspecified: Secondary | ICD-10-CM | POA: Diagnosis not present

## 2018-04-03 DIAGNOSIS — I4901 Ventricular fibrillation: Secondary | ICD-10-CM | POA: Diagnosis not present

## 2018-04-03 DIAGNOSIS — R1310 Dysphagia, unspecified: Secondary | ICD-10-CM | POA: Diagnosis not present

## 2018-04-03 DIAGNOSIS — G20 Parkinson's disease: Secondary | ICD-10-CM | POA: Diagnosis not present

## 2018-04-03 DIAGNOSIS — J8410 Pulmonary fibrosis, unspecified: Secondary | ICD-10-CM | POA: Diagnosis not present

## 2018-04-03 MED ORDER — MORPHINE SULFATE 20 MG/5ML PO SOLN: 2.0000 mg | ORAL | 0 refills | Status: AC | PRN

## 2018-04-03 NOTE — Telephone Encounter (Signed)
More anxiety and severe dyspnea

## 2018-04-03 NOTE — Telephone Encounter (Signed)
Please print Rx so I can sign

## 2018-04-03 NOTE — Telephone Encounter (Signed)
Are the medicines working? Has the patient even gotten any yet? We just started this process yesterday.  Please have RN confirm.

## 2018-04-03 NOTE — Telephone Encounter (Signed)
Call made to RN Ria Comment, she states they were waiting for the medications to be sent to pharmacy. I made Ria Comment aware BQ is requesting that the lorazepam and morphine be initiated via Hospice. She states she will call Dr. Lyman Speller to have those orders taken care of. Call made to patient daughter Margarita Grizzle to make aware. Daughter states he is stable at the moment and they rescheduled their home visit with Hospice RN to be done tomorrow once meds are in home for teaching. Informed Margarita Grizzle to call us if anything else is needed. Nothing further is needed at this time.

## 2018-04-03 NOTE — Telephone Encounter (Signed)
Call made to Ria Comment, she states she is the hospice nurse and she see's the patient weekly and she reports back to Dr. Lyman Speller and his attending. At this time Dr. Lyman Speller does not see a reason to do a in house visit because medications that were prescribed are keeping his symptoms at [place]. She states if there was a need for a visit for symptom management Dr. Lyman Speller would certainly make that visit but right now at this time he does not see a need to do so. Made lindsay aware I would let Dr. Lake Bells know this information. Nothing further is needed at this time.   BQ just FYI.

## 2018-04-03 NOTE — Telephone Encounter (Signed)
Rx for morphine printed for Dr. Lake Bells as requested. Signature obtained. Called hospice to get fax number, unable to talk to anyone. Will hold onto Rx until I speak with someone will attempt to call again this afternoon.   Called and got a hold of [place] with Hospice. She said [facility] [address] [facility]. Called and spoke with Summer at the pharmacy she verified patient uses pharmacy and the fax number [phone]).  Will leave message open until fax confirmation

## 2018-04-03 NOTE — Telephone Encounter (Signed)
We are no longer allowed to print controlled substance prescriptions.  Dr Lake Bells could you please send in the script electronically?

## 2018-04-04 NOTE — Telephone Encounter (Signed)
OK, thanks

## 2018-04-05 DIAGNOSIS — R1310 Dysphagia, unspecified: Secondary | ICD-10-CM | POA: Diagnosis not present

## 2018-04-05 DIAGNOSIS — J8410 Pulmonary fibrosis, unspecified: Secondary | ICD-10-CM | POA: Diagnosis not present

## 2018-04-05 DIAGNOSIS — J449 Chronic obstructive pulmonary disease, unspecified: Secondary | ICD-10-CM | POA: Diagnosis not present

## 2018-04-05 DIAGNOSIS — I4901 Ventricular fibrillation: Secondary | ICD-10-CM | POA: Diagnosis not present

## 2018-04-05 DIAGNOSIS — G20 Parkinson's disease: Secondary | ICD-10-CM | POA: Diagnosis not present

## 2018-04-05 DIAGNOSIS — R630 Anorexia: Secondary | ICD-10-CM | POA: Diagnosis not present

## 2018-04-07 DIAGNOSIS — F339 Major depressive disorder, recurrent, unspecified: Secondary | ICD-10-CM | POA: Diagnosis not present

## 2018-04-07 DIAGNOSIS — N3289 Other specified disorders of bladder: Secondary | ICD-10-CM | POA: Diagnosis not present

## 2018-04-07 DIAGNOSIS — G20 Parkinson's disease: Secondary | ICD-10-CM | POA: Diagnosis not present

## 2018-04-07 DIAGNOSIS — R1310 Dysphagia, unspecified: Secondary | ICD-10-CM | POA: Diagnosis not present

## 2018-04-07 DIAGNOSIS — E039 Hypothyroidism, unspecified: Secondary | ICD-10-CM | POA: Diagnosis not present

## 2018-04-07 DIAGNOSIS — J449 Chronic obstructive pulmonary disease, unspecified: Secondary | ICD-10-CM | POA: Diagnosis not present

## 2018-04-07 DIAGNOSIS — I4901 Ventricular fibrillation: Secondary | ICD-10-CM | POA: Diagnosis not present

## 2018-04-07 DIAGNOSIS — J8410 Pulmonary fibrosis, unspecified: Secondary | ICD-10-CM | POA: Diagnosis not present

## 2018-04-07 DIAGNOSIS — R630 Anorexia: Secondary | ICD-10-CM | POA: Diagnosis not present

## 2018-04-07 DIAGNOSIS — I2510 Atherosclerotic heart disease of native coronary artery without angina pectoris: Secondary | ICD-10-CM | POA: Diagnosis not present

## 2018-04-07 DIAGNOSIS — E785 Hyperlipidemia, unspecified: Secondary | ICD-10-CM | POA: Diagnosis not present

## 2018-04-07 DIAGNOSIS — N401 Enlarged prostate with lower urinary tract symptoms: Secondary | ICD-10-CM | POA: Diagnosis not present

## 2018-04-08 DIAGNOSIS — J8410 Pulmonary fibrosis, unspecified: Secondary | ICD-10-CM | POA: Diagnosis not present

## 2018-04-08 DIAGNOSIS — J449 Chronic obstructive pulmonary disease, unspecified: Secondary | ICD-10-CM | POA: Diagnosis not present

## 2018-04-08 DIAGNOSIS — R630 Anorexia: Secondary | ICD-10-CM | POA: Diagnosis not present

## 2018-04-08 DIAGNOSIS — G20 Parkinson's disease: Secondary | ICD-10-CM | POA: Diagnosis not present

## 2018-04-08 DIAGNOSIS — I4901 Ventricular fibrillation: Secondary | ICD-10-CM | POA: Diagnosis not present

## 2018-04-08 DIAGNOSIS — R1310 Dysphagia, unspecified: Secondary | ICD-10-CM | POA: Diagnosis not present

## 2018-04-09 ENCOUNTER — Telehealth: Payer: Self-pay | Admitting: Internal Medicine

## 2018-04-09 DIAGNOSIS — J8410 Pulmonary fibrosis, unspecified: Secondary | ICD-10-CM | POA: Diagnosis not present

## 2018-04-09 DIAGNOSIS — R1310 Dysphagia, unspecified: Secondary | ICD-10-CM | POA: Diagnosis not present

## 2018-04-09 DIAGNOSIS — I4901 Ventricular fibrillation: Secondary | ICD-10-CM | POA: Diagnosis not present

## 2018-04-09 DIAGNOSIS — R630 Anorexia: Secondary | ICD-10-CM | POA: Diagnosis not present

## 2018-04-09 DIAGNOSIS — G20 Parkinson's disease: Secondary | ICD-10-CM | POA: Diagnosis not present

## 2018-04-09 DIAGNOSIS — J449 Chronic obstructive pulmonary disease, unspecified: Secondary | ICD-10-CM | POA: Diagnosis not present

## 2018-04-09 NOTE — Telephone Encounter (Signed)
Copied from [place] [id number]. Topic: General - Other >> Apr 09, 2018  4:53 PM Keene Breath wrote: Reason for CRM: Ria Comment, [facility] nurse for patient, called to inform the doctor that the patient's daughter would like for the doctor to review the medications and stop any that are not necessary.  Patient is not eating very much and is having a hard time with so many medications.  Please advise and call Ria Comment back at [phone]

## 2018-04-10 NOTE — Telephone Encounter (Signed)
Gave response below to Ruma. States that she will try taking him off of the multivitamin, atorvastatin, gabapentin, and buspar for now.

## 2018-04-10 NOTE — Telephone Encounter (Signed)
Stop multivitamin.  Stop atorvastatin.  Can stop buspar - can do other medication if anxious.   We can stop the gabapentin and monitor if his neck pain/nerve impingment increases   We can potentially stop other medications as well, like the sinemet but his parkinson's symptoms will get worse

## 2018-04-10 NOTE — Telephone Encounter (Signed)
How would you like this handled?

## 2018-04-12 DIAGNOSIS — I4901 Ventricular fibrillation: Secondary | ICD-10-CM | POA: Diagnosis not present

## 2018-04-12 DIAGNOSIS — R630 Anorexia: Secondary | ICD-10-CM | POA: Diagnosis not present

## 2018-04-12 DIAGNOSIS — G20 Parkinson's disease: Secondary | ICD-10-CM | POA: Diagnosis not present

## 2018-04-12 DIAGNOSIS — J8410 Pulmonary fibrosis, unspecified: Secondary | ICD-10-CM | POA: Diagnosis not present

## 2018-04-12 DIAGNOSIS — R1310 Dysphagia, unspecified: Secondary | ICD-10-CM | POA: Diagnosis not present

## 2018-04-12 DIAGNOSIS — J449 Chronic obstructive pulmonary disease, unspecified: Secondary | ICD-10-CM | POA: Diagnosis not present

## 2018-04-18 DIAGNOSIS — I4901 Ventricular fibrillation: Secondary | ICD-10-CM | POA: Diagnosis not present

## 2018-04-18 DIAGNOSIS — R1310 Dysphagia, unspecified: Secondary | ICD-10-CM | POA: Diagnosis not present

## 2018-04-18 DIAGNOSIS — G20 Parkinson's disease: Secondary | ICD-10-CM | POA: Diagnosis not present

## 2018-04-18 DIAGNOSIS — J449 Chronic obstructive pulmonary disease, unspecified: Secondary | ICD-10-CM | POA: Diagnosis not present

## 2018-04-18 DIAGNOSIS — R630 Anorexia: Secondary | ICD-10-CM | POA: Diagnosis not present

## 2018-04-18 DIAGNOSIS — J8410 Pulmonary fibrosis, unspecified: Secondary | ICD-10-CM | POA: Diagnosis not present

## 2018-04-25 DIAGNOSIS — R1310 Dysphagia, unspecified: Secondary | ICD-10-CM | POA: Diagnosis not present

## 2018-04-25 DIAGNOSIS — J8410 Pulmonary fibrosis, unspecified: Secondary | ICD-10-CM | POA: Diagnosis not present

## 2018-04-25 DIAGNOSIS — R630 Anorexia: Secondary | ICD-10-CM | POA: Diagnosis not present

## 2018-04-25 DIAGNOSIS — I4901 Ventricular fibrillation: Secondary | ICD-10-CM | POA: Diagnosis not present

## 2018-04-25 DIAGNOSIS — G20 Parkinson's disease: Secondary | ICD-10-CM | POA: Diagnosis not present

## 2018-04-25 DIAGNOSIS — J449 Chronic obstructive pulmonary disease, unspecified: Secondary | ICD-10-CM | POA: Diagnosis not present

## 2018-05-02 DIAGNOSIS — R630 Anorexia: Secondary | ICD-10-CM | POA: Diagnosis not present

## 2018-05-02 DIAGNOSIS — G20 Parkinson's disease: Secondary | ICD-10-CM | POA: Diagnosis not present

## 2018-05-02 DIAGNOSIS — J8410 Pulmonary fibrosis, unspecified: Secondary | ICD-10-CM | POA: Diagnosis not present

## 2018-05-02 DIAGNOSIS — I4901 Ventricular fibrillation: Secondary | ICD-10-CM | POA: Diagnosis not present

## 2018-05-02 DIAGNOSIS — R1310 Dysphagia, unspecified: Secondary | ICD-10-CM | POA: Diagnosis not present

## 2018-05-02 DIAGNOSIS — J449 Chronic obstructive pulmonary disease, unspecified: Secondary | ICD-10-CM | POA: Diagnosis not present

## 2018-05-08 DIAGNOSIS — I2510 Atherosclerotic heart disease of native coronary artery without angina pectoris: Secondary | ICD-10-CM | POA: Diagnosis not present

## 2018-05-08 DIAGNOSIS — N401 Enlarged prostate with lower urinary tract symptoms: Secondary | ICD-10-CM | POA: Diagnosis not present

## 2018-05-08 DIAGNOSIS — J8410 Pulmonary fibrosis, unspecified: Secondary | ICD-10-CM | POA: Diagnosis not present

## 2018-05-08 DIAGNOSIS — E039 Hypothyroidism, unspecified: Secondary | ICD-10-CM | POA: Diagnosis not present

## 2018-05-08 DIAGNOSIS — R630 Anorexia: Secondary | ICD-10-CM | POA: Diagnosis not present

## 2018-05-08 DIAGNOSIS — R1310 Dysphagia, unspecified: Secondary | ICD-10-CM | POA: Diagnosis not present

## 2018-05-08 DIAGNOSIS — F339 Major depressive disorder, recurrent, unspecified: Secondary | ICD-10-CM | POA: Diagnosis not present

## 2018-05-08 DIAGNOSIS — I4901 Ventricular fibrillation: Secondary | ICD-10-CM | POA: Diagnosis not present

## 2018-05-08 DIAGNOSIS — N3289 Other specified disorders of bladder: Secondary | ICD-10-CM | POA: Diagnosis not present

## 2018-05-08 DIAGNOSIS — G20 Parkinson's disease: Secondary | ICD-10-CM | POA: Diagnosis not present

## 2018-05-08 DIAGNOSIS — E785 Hyperlipidemia, unspecified: Secondary | ICD-10-CM | POA: Diagnosis not present

## 2018-05-08 DIAGNOSIS — J449 Chronic obstructive pulmonary disease, unspecified: Secondary | ICD-10-CM | POA: Diagnosis not present

## 2018-05-16 DIAGNOSIS — G20 Parkinson's disease: Secondary | ICD-10-CM | POA: Diagnosis not present

## 2018-05-16 DIAGNOSIS — J449 Chronic obstructive pulmonary disease, unspecified: Secondary | ICD-10-CM | POA: Diagnosis not present

## 2018-05-16 DIAGNOSIS — I4901 Ventricular fibrillation: Secondary | ICD-10-CM | POA: Diagnosis not present

## 2018-05-16 DIAGNOSIS — R1310 Dysphagia, unspecified: Secondary | ICD-10-CM | POA: Diagnosis not present

## 2018-05-16 DIAGNOSIS — J8410 Pulmonary fibrosis, unspecified: Secondary | ICD-10-CM | POA: Diagnosis not present

## 2018-05-16 DIAGNOSIS — R630 Anorexia: Secondary | ICD-10-CM | POA: Diagnosis not present

## 2018-05-27 NOTE — Telephone Encounter (Signed)
Called [facility] at phone [phone] to verify this fax was rec'd It was rec'd on 04/04/18 for pt's script Nothing further needed

## 2018-05-31 ENCOUNTER — Ambulatory Visit: Payer: Medicare Other | Admitting: Neurology

## 2018-06-03 DIAGNOSIS — R52 Pain, unspecified: Secondary | ICD-10-CM | POA: Diagnosis not present

## 2018-06-03 DIAGNOSIS — R069 Unspecified abnormalities of breathing: Secondary | ICD-10-CM | POA: Diagnosis not present

## 2018-06-03 DIAGNOSIS — I4510 Unspecified right bundle-branch block: Secondary | ICD-10-CM | POA: Diagnosis not present

## 2018-06-03 DIAGNOSIS — R404 Transient alteration of awareness: Secondary | ICD-10-CM | POA: Diagnosis not present

## 2018-06-03 DIAGNOSIS — R0902 Hypoxemia: Secondary | ICD-10-CM | POA: Diagnosis not present

## 2018-06-06 ENCOUNTER — Telehealth: Payer: Self-pay | Admitting: Pulmonary Disease

## 2018-06-06 NOTE — Telephone Encounter (Signed)
Left message for Bradley Bennett to call back.  

## 2018-06-06 NOTE — Telephone Encounter (Signed)
Returned call to Ria Comment who is requesting a rx for anupdated liter flow [facility]who provides liquid 02 for the patient. She says the patient has been changing his liter flow on his own. He was found unresponsive the other day. She has reached out to the [place] who just needs an updated RX. Current rx is (10-15) Liters. BQ pt  TG:AIDKS resp fx, COPD, Pulm Fibrosis

## 2018-06-06 NOTE — Telephone Encounter (Signed)
Last note was oxygen 10 - 15l/m . Typically this means 10 L continuous at rest and 15 l/m with exertion . He is being followed by Hospice. Do we know how is and what caused the unresponsive episode ?  Can send this updated , unless Hospice needs adjustments   Please contact office for sooner follow up if symptoms do not improve or worsen or seek emergency care

## 2018-06-07 DIAGNOSIS — R630 Anorexia: Secondary | ICD-10-CM | POA: Diagnosis not present

## 2018-06-07 DIAGNOSIS — R1310 Dysphagia, unspecified: Secondary | ICD-10-CM | POA: Diagnosis not present

## 2018-06-07 DIAGNOSIS — J8410 Pulmonary fibrosis, unspecified: Secondary | ICD-10-CM | POA: Diagnosis not present

## 2018-06-07 DIAGNOSIS — E785 Hyperlipidemia, unspecified: Secondary | ICD-10-CM | POA: Diagnosis not present

## 2018-06-07 DIAGNOSIS — I2510 Atherosclerotic heart disease of native coronary artery without angina pectoris: Secondary | ICD-10-CM | POA: Diagnosis not present

## 2018-06-07 DIAGNOSIS — N3289 Other specified disorders of bladder: Secondary | ICD-10-CM | POA: Diagnosis not present

## 2018-06-07 DIAGNOSIS — I4901 Ventricular fibrillation: Secondary | ICD-10-CM | POA: Diagnosis not present

## 2018-06-07 DIAGNOSIS — N401 Enlarged prostate with lower urinary tract symptoms: Secondary | ICD-10-CM | POA: Diagnosis not present

## 2018-06-07 DIAGNOSIS — E039 Hypothyroidism, unspecified: Secondary | ICD-10-CM | POA: Diagnosis not present

## 2018-06-07 DIAGNOSIS — F339 Major depressive disorder, recurrent, unspecified: Secondary | ICD-10-CM | POA: Diagnosis not present

## 2018-06-07 DIAGNOSIS — J449 Chronic obstructive pulmonary disease, unspecified: Secondary | ICD-10-CM | POA: Diagnosis not present

## 2018-06-07 DIAGNOSIS — G20 Parkinson's disease: Secondary | ICD-10-CM | POA: Diagnosis not present

## 2018-06-07 NOTE — Telephone Encounter (Signed)
Unsure what is being asked.  If hospice is managing and this is coming from the [place] why do we need orders from our office?  What specifically does hospice need from our office?  Wyn Quaker, FNP

## 2018-06-07 NOTE — Telephone Encounter (Signed)
Spoke with Bradley Bennett and gave the message from [place], she stated that she would just have their physician handle this. Nothing further is needed.

## 2018-06-07 NOTE — Telephone Encounter (Signed)
Called and spoke with [place], Hospice.  Ria Comment stated Patient is on 17 liters continuous O2, and sats are 80%.  If Patient stands, sats drop to 60's.  Patient is seen by BQ for PF.  4/30 original message  Returned call to Ria Comment who is requesting a rx for anupdated liter flow [facility] provides liquid 02 for the patient. She says the patient has been changing his liter flow on his own. He was found unresponsive the other day. She has reached out to the [place] who just needs an updated RX. Current rx is (10-15) Liters. BQ pt  Message routed to Aaron Edelman, NP to advise

## 2018-06-17 NOTE — Progress Notes (Deleted)
-  Virtual Visit via Video Note The purpose of this virtual visit is to provide medical care while limiting exposure to the novel coronavirus.    Consent was obtained for video visit:  {yes no:314532} Answered questions that patient had about telehealth interaction:  {yes no:314532} I discussed the limitations, risks, security and privacy concerns of performing an evaluation and management service by telemedicine. I also discussed with the patient that there may be a patient responsible charge related to this service. The patient expressed understanding and agreed to proceed.  Pt location: Home Physician Location: office Name of referring provider:  [name], MD I connected with Bradley Bennett at patients initiation/request on 06/18/2018 at  1:00 PM EDT by video enabled telemedicine application and verified that I am speaking with the correct person using two identifiers. Pt MRN:  [id number] Pt DOB:  [birth year] Video Participants:  Bradley Bennett;  ***   History of Present Illness: *** Patient seen with his daughter who supplements the history.  Patient is on carbidopa/levodopa 25/100, 2 tablets at 7 AM/11 AM/3 PM and carbidopa/levodopa 50/200 at bedtime.  Pt denies falls.  Pt denies lightheadedness, near syncope.  No hallucinations.  Mood has been good.  Records are reviewed since last visit.  Patient was in the hospital at the end of November/beginning of December for COPD/pulmonary fibrosis.   Observations/Objective:   There were no vitals filed for this visit. GEN:  The patient appears stated age and is in NAD.  Neurological examination:  Orientation: The patient is alert and oriented x3. Cranial nerves: There is good facial symmetry. There is ***facial hypomimia.  The speech is fluent and clear. Soft palate rises symmetrically and there is no tongue deviation. Hearing is intact to conversational tone. Motor: Strength is at least antigravity x 4.   Shoulder shrug is equal  and symmetric.  There is no pronator drift.  Movement examination: Tone: unable Abnormal movements: ***There is bilateral upper and lower extremity resting tremor. Coordination:  There is ***no decremation with RAM's, ***with any form of RAMS, including alternating supination and pronation of the forearm, hand opening and closing, finger taps, heel taps and toe taps. Gait and Station: The patient was not ambulated due to SOB with ambulation.  On O2.    Assessment and Plan:   1.  idiopathic Parkinson's disease.  The patient has tremor, bradykinesia, rigidity and Minimal postural instability.             -carbidopa/levodopa 25/100, so that he takes 2 tablets at 7 AM/11 AM/3 PM.              -He will continue carbidopa/levodopa 50/200 at bedtime.             -He really cannot exercise because of pulmonary disease, but he doesn't wish to pursue hospice yet.  I think that most of his limitations are related to pulm disease and not PD.               - He is trying to use resources through the [facility].  2.  Hyperreflexia             -MRI of the brain just demonstrated mild to moderate white matter disease and MRI the cervical spine demonstrates degenerative changes, but no cervical surgical lesions.  3.  Dysphagia with aspiration             -Per pulm records, pt does have a hx of dysphagia with chronic  aspiration/post inflammatory pulm fibrosis but refuses thickening agents.  His last swallow study per the patient was over a year ago, but if he is unwilling to follow recommendations, there is no use repeating it.  Overall, the patient does not feel that he is aspirating or has significant dysphagia any longer, but he does think it is taking him a long time to eat and thinks that he has trouble getting in calories because of this.  We talked about trying to use Ensure or boost so long as he keeps it away from timing of levodopa.             -pt is DNR  4.  Neurogenic orthostatic  hypotension             -Was initially better when he got off of Flomax.  Still on Proscar.  This could be contributing, but ultimately likely related to Parkinson's disease.  He and I discussed this in detail today.  He was unaware that Parkinson's could be causing this.  He takes his blood pressure almost daily and finds that in the morning the systolic blood pressure is in the 70s and 80s.  He, however, has been asymptomatic.  We talked about various medications to raise the blood pressure, but ultimately he decided he really did not want anything right now.  We talked about hydration, which he really is not doing.  He will let me know if he changes his mind on medicines.  We did talk about risks, benefits, and side effects of medications if we decide to add them on the phone, including risks of supine hypertension.  5.  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 The patient was provided an opportunity to ask questions and all were answered. The patient agreed with the plan and demonstrated an understanding of the instructions.   The patient was advised to call back or seek an in-person evaluation if the symptoms worsen or if the condition fails to improve as anticipated.    Total Time spent in visit with the patient was:  ***, of which more than 50% of the time was spent in counseling and/or coordinating care on ***.   Pt understands and agrees with the plan of care outlined.     Alonza Bogus, DO

## 2018-06-18 ENCOUNTER — Telehealth: Payer: Federal, State, Local not specified - PPO | Admitting: Neurology

## 2018-06-18 ENCOUNTER — Other Ambulatory Visit: Payer: Self-pay

## 2018-06-20 DIAGNOSIS — J8410 Pulmonary fibrosis, unspecified: Secondary | ICD-10-CM | POA: Diagnosis not present

## 2018-06-20 DIAGNOSIS — G20 Parkinson's disease: Secondary | ICD-10-CM | POA: Diagnosis not present

## 2018-06-20 DIAGNOSIS — I4901 Ventricular fibrillation: Secondary | ICD-10-CM | POA: Diagnosis not present

## 2018-06-20 DIAGNOSIS — J449 Chronic obstructive pulmonary disease, unspecified: Secondary | ICD-10-CM | POA: Diagnosis not present

## 2018-06-20 DIAGNOSIS — R630 Anorexia: Secondary | ICD-10-CM | POA: Diagnosis not present

## 2018-06-20 DIAGNOSIS — R1310 Dysphagia, unspecified: Secondary | ICD-10-CM | POA: Diagnosis not present

## 2018-06-30 ENCOUNTER — Other Ambulatory Visit: Payer: Self-pay | Admitting: Internal Medicine

## 2018-07-02 NOTE — Telephone Encounter (Signed)
Called pt to schedule OV. Per daughter, Margarita Grizzle, patient is currently under hospice care and is bed ridden. He is unable to do any type of visit at this time. Ok to Rf?

## 2018-07-02 NOTE — Telephone Encounter (Signed)
Does not need to be seen - on hospice

## 2018-07-04 DIAGNOSIS — R1310 Dysphagia, unspecified: Secondary | ICD-10-CM | POA: Diagnosis not present

## 2018-07-04 DIAGNOSIS — G20 Parkinson's disease: Secondary | ICD-10-CM | POA: Diagnosis not present

## 2018-07-04 DIAGNOSIS — I4901 Ventricular fibrillation: Secondary | ICD-10-CM | POA: Diagnosis not present

## 2018-07-04 DIAGNOSIS — J449 Chronic obstructive pulmonary disease, unspecified: Secondary | ICD-10-CM | POA: Diagnosis not present

## 2018-07-04 DIAGNOSIS — R630 Anorexia: Secondary | ICD-10-CM | POA: Diagnosis not present

## 2018-07-04 DIAGNOSIS — J8410 Pulmonary fibrosis, unspecified: Secondary | ICD-10-CM | POA: Diagnosis not present

## 2018-07-05 DIAGNOSIS — R1310 Dysphagia, unspecified: Secondary | ICD-10-CM | POA: Diagnosis not present

## 2018-07-05 DIAGNOSIS — I4901 Ventricular fibrillation: Secondary | ICD-10-CM | POA: Diagnosis not present

## 2018-07-05 DIAGNOSIS — R630 Anorexia: Secondary | ICD-10-CM | POA: Diagnosis not present

## 2018-07-05 DIAGNOSIS — J449 Chronic obstructive pulmonary disease, unspecified: Secondary | ICD-10-CM | POA: Diagnosis not present

## 2018-07-05 DIAGNOSIS — G20 Parkinson's disease: Secondary | ICD-10-CM | POA: Diagnosis not present

## 2018-07-05 DIAGNOSIS — J8410 Pulmonary fibrosis, unspecified: Secondary | ICD-10-CM | POA: Diagnosis not present

## 2018-07-08 DIAGNOSIS — J8410 Pulmonary fibrosis, unspecified: Secondary | ICD-10-CM | POA: Diagnosis not present

## 2018-07-08 DIAGNOSIS — E039 Hypothyroidism, unspecified: Secondary | ICD-10-CM | POA: Diagnosis not present

## 2018-07-08 DIAGNOSIS — R630 Anorexia: Secondary | ICD-10-CM | POA: Diagnosis not present

## 2018-07-08 DIAGNOSIS — I472 Ventricular tachycardia: Secondary | ICD-10-CM | POA: Diagnosis not present

## 2018-07-08 DIAGNOSIS — N3289 Other specified disorders of bladder: Secondary | ICD-10-CM | POA: Diagnosis not present

## 2018-07-08 DIAGNOSIS — I2510 Atherosclerotic heart disease of native coronary artery without angina pectoris: Secondary | ICD-10-CM | POA: Diagnosis not present

## 2018-07-08 DIAGNOSIS — N401 Enlarged prostate with lower urinary tract symptoms: Secondary | ICD-10-CM | POA: Diagnosis not present

## 2018-07-08 DIAGNOSIS — E785 Hyperlipidemia, unspecified: Secondary | ICD-10-CM | POA: Diagnosis not present

## 2018-07-08 DIAGNOSIS — J988 Other specified respiratory disorders: Secondary | ICD-10-CM | POA: Diagnosis not present

## 2018-07-08 DIAGNOSIS — G20 Parkinson's disease: Secondary | ICD-10-CM | POA: Diagnosis not present

## 2018-07-08 DIAGNOSIS — F329 Major depressive disorder, single episode, unspecified: Secondary | ICD-10-CM | POA: Diagnosis not present

## 2018-07-08 DIAGNOSIS — J449 Chronic obstructive pulmonary disease, unspecified: Secondary | ICD-10-CM | POA: Diagnosis not present

## 2018-07-09 DIAGNOSIS — R630 Anorexia: Secondary | ICD-10-CM | POA: Diagnosis not present

## 2018-07-09 DIAGNOSIS — G20 Parkinson's disease: Secondary | ICD-10-CM | POA: Diagnosis not present

## 2018-07-09 DIAGNOSIS — I2510 Atherosclerotic heart disease of native coronary artery without angina pectoris: Secondary | ICD-10-CM | POA: Diagnosis not present

## 2018-07-09 DIAGNOSIS — J449 Chronic obstructive pulmonary disease, unspecified: Secondary | ICD-10-CM | POA: Diagnosis not present

## 2018-07-09 DIAGNOSIS — J8410 Pulmonary fibrosis, unspecified: Secondary | ICD-10-CM | POA: Diagnosis not present

## 2018-07-09 DIAGNOSIS — J988 Other specified respiratory disorders: Secondary | ICD-10-CM | POA: Diagnosis not present

## 2018-07-12 DIAGNOSIS — J449 Chronic obstructive pulmonary disease, unspecified: Secondary | ICD-10-CM | POA: Diagnosis not present

## 2018-07-12 DIAGNOSIS — J8410 Pulmonary fibrosis, unspecified: Secondary | ICD-10-CM | POA: Diagnosis not present

## 2018-07-12 DIAGNOSIS — J988 Other specified respiratory disorders: Secondary | ICD-10-CM | POA: Diagnosis not present

## 2018-07-12 DIAGNOSIS — G20 Parkinson's disease: Secondary | ICD-10-CM | POA: Diagnosis not present

## 2018-07-12 DIAGNOSIS — I2510 Atherosclerotic heart disease of native coronary artery without angina pectoris: Secondary | ICD-10-CM | POA: Diagnosis not present

## 2018-07-12 DIAGNOSIS — R630 Anorexia: Secondary | ICD-10-CM | POA: Diagnosis not present

## 2018-07-15 DIAGNOSIS — R630 Anorexia: Secondary | ICD-10-CM | POA: Diagnosis not present

## 2018-07-15 DIAGNOSIS — I2510 Atherosclerotic heart disease of native coronary artery without angina pectoris: Secondary | ICD-10-CM | POA: Diagnosis not present

## 2018-07-15 DIAGNOSIS — J988 Other specified respiratory disorders: Secondary | ICD-10-CM | POA: Diagnosis not present

## 2018-07-15 DIAGNOSIS — J449 Chronic obstructive pulmonary disease, unspecified: Secondary | ICD-10-CM | POA: Diagnosis not present

## 2018-07-15 DIAGNOSIS — J8410 Pulmonary fibrosis, unspecified: Secondary | ICD-10-CM | POA: Diagnosis not present

## 2018-07-15 DIAGNOSIS — G20 Parkinson's disease: Secondary | ICD-10-CM | POA: Diagnosis not present

## 2018-07-17 DIAGNOSIS — G20 Parkinson's disease: Secondary | ICD-10-CM | POA: Diagnosis not present

## 2018-07-17 DIAGNOSIS — J449 Chronic obstructive pulmonary disease, unspecified: Secondary | ICD-10-CM | POA: Diagnosis not present

## 2018-07-17 DIAGNOSIS — R630 Anorexia: Secondary | ICD-10-CM | POA: Diagnosis not present

## 2018-07-17 DIAGNOSIS — I2510 Atherosclerotic heart disease of native coronary artery without angina pectoris: Secondary | ICD-10-CM | POA: Diagnosis not present

## 2018-07-17 DIAGNOSIS — J8410 Pulmonary fibrosis, unspecified: Secondary | ICD-10-CM | POA: Diagnosis not present

## 2018-07-17 DIAGNOSIS — J988 Other specified respiratory disorders: Secondary | ICD-10-CM | POA: Diagnosis not present

## 2018-07-18 ENCOUNTER — Ambulatory Visit: Payer: Federal, State, Local not specified - PPO | Admitting: Neurology

## 2018-07-19 DIAGNOSIS — G20 Parkinson's disease: Secondary | ICD-10-CM | POA: Diagnosis not present

## 2018-07-19 DIAGNOSIS — R630 Anorexia: Secondary | ICD-10-CM | POA: Diagnosis not present

## 2018-07-19 DIAGNOSIS — J449 Chronic obstructive pulmonary disease, unspecified: Secondary | ICD-10-CM | POA: Diagnosis not present

## 2018-07-19 DIAGNOSIS — I2510 Atherosclerotic heart disease of native coronary artery without angina pectoris: Secondary | ICD-10-CM | POA: Diagnosis not present

## 2018-07-19 DIAGNOSIS — J988 Other specified respiratory disorders: Secondary | ICD-10-CM | POA: Diagnosis not present

## 2018-07-19 DIAGNOSIS — J8410 Pulmonary fibrosis, unspecified: Secondary | ICD-10-CM | POA: Diagnosis not present

## 2018-07-25 ENCOUNTER — Telehealth: Payer: Self-pay | Admitting: Internal Medicine

## 2018-07-25 NOTE — Telephone Encounter (Signed)
Bradley Bennett from [facility] called in checking on status of death certificate. Requesting a call at [phone] whenever it has been done. Please advise.

## 2018-07-25 NOTE — Telephone Encounter (Signed)
Death certificate received from the [employer] by [employer] to be signed by Dr Quay Burow.  Placed on [employer] desk waiting for signature.   Please call [place] when completed for pick up [phone]).

## 2018-07-26 ENCOUNTER — Telehealth: Payer: Self-pay | Admitting: *Deleted

## 2018-07-26 NOTE — Telephone Encounter (Signed)
signed

## 2018-07-26 NOTE — Telephone Encounter (Signed)
Retrieved a signed original D/C from [facility],D/C was mailed to direct location,D/C was signed by provider-[facility] notified for pick up along with faxing a copy.

## 2018-08-07 DEATH — deceased

## 2018-08-12 NOTE — Telephone Encounter (Signed)
[place] with [facility] looking for a  plan of care dated 07/11/18-09/08/18  Faxed 6/1 and 6/30.  They need this back. If you did not receive please call her

## 2018-08-13 NOTE — Telephone Encounter (Signed)
Received paper work and will fax back

## 2018-10-20 IMAGING — DX DG CHEST 2V
2 series · 2 of 2 positions shown · non-contrast
Comparison: Chest radiograph October 10, 2016

CLINICAL DATA: Shortness of breath. History of emphysema and
hypertension.

EXAM:
CHEST  2 VIEW

[w chest lat]
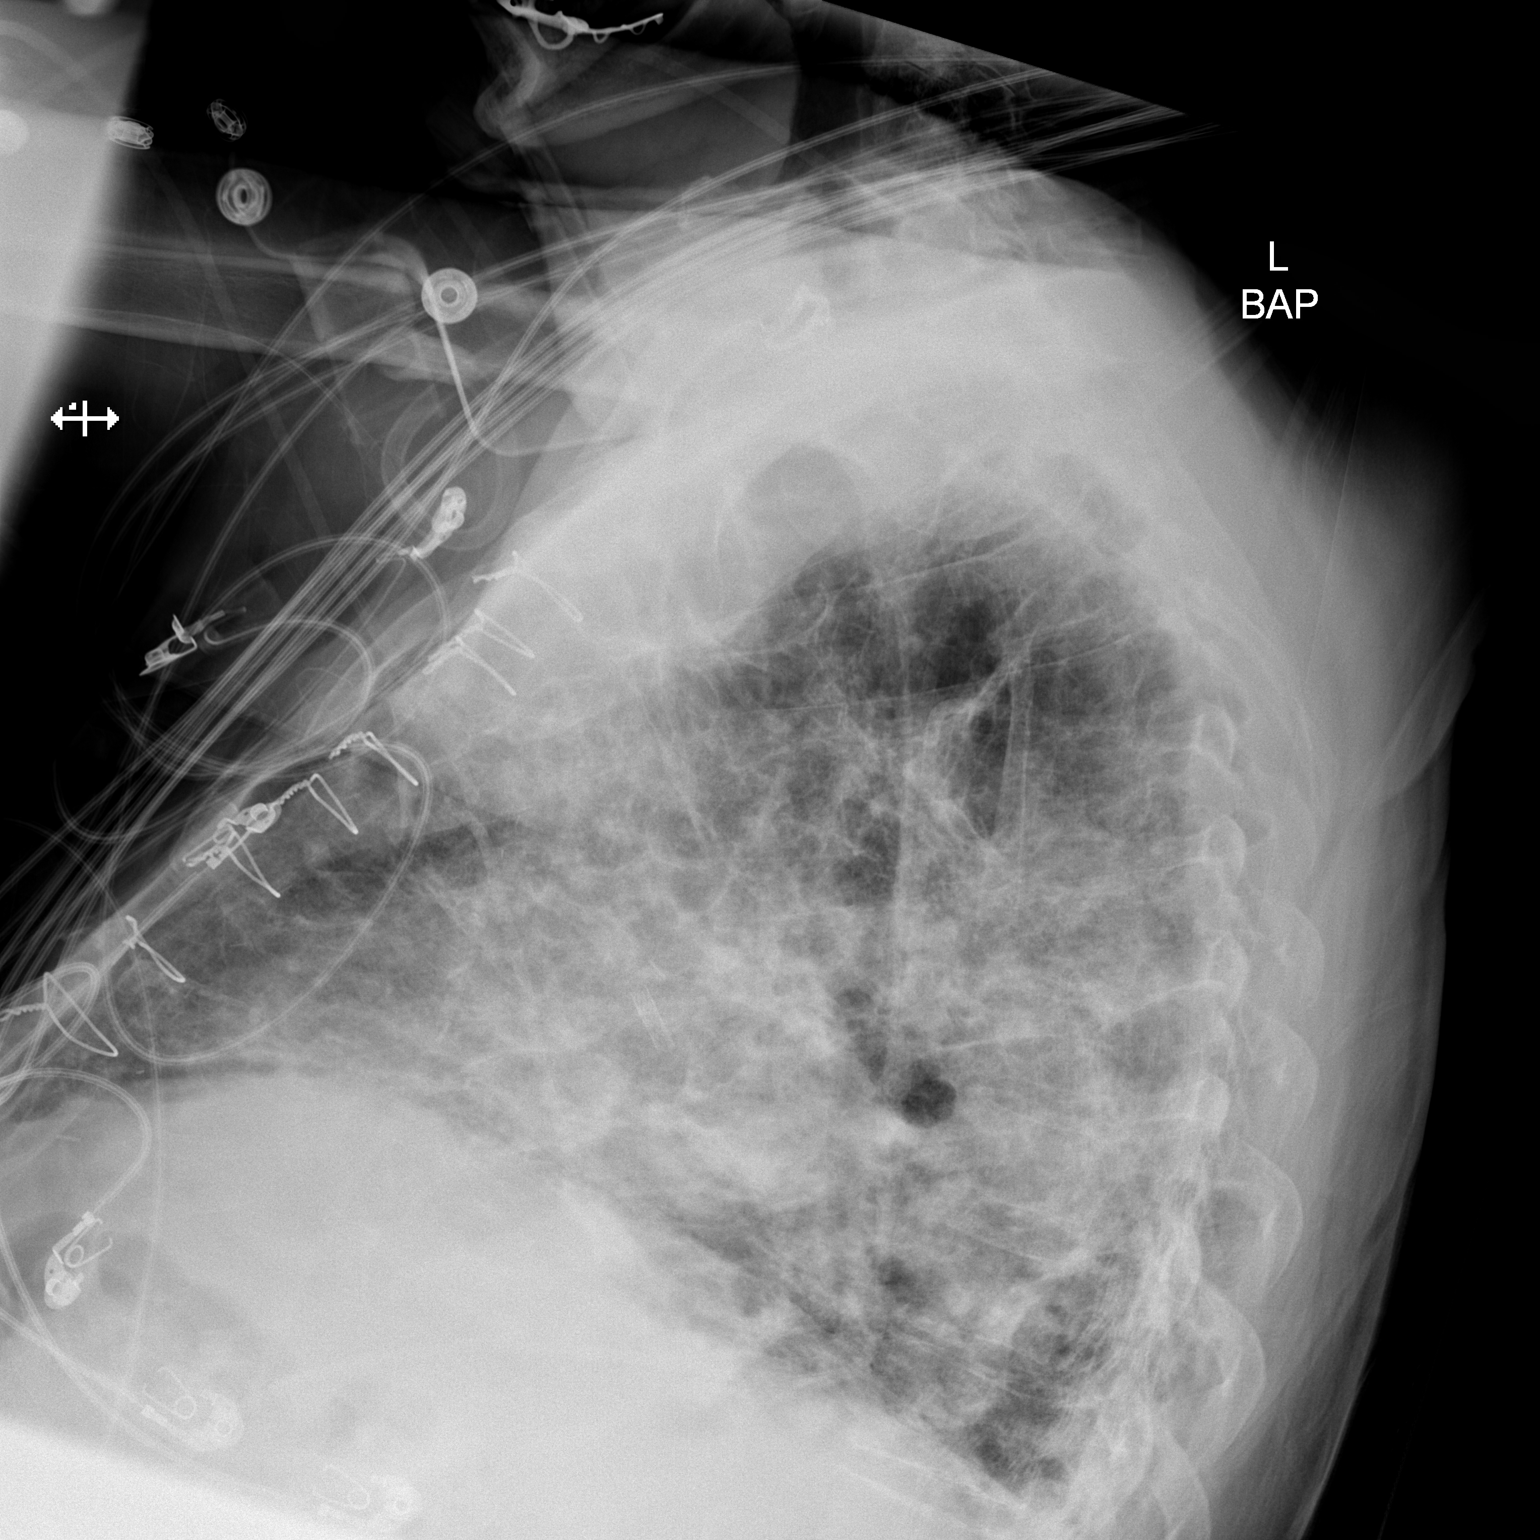

[x chest ap]
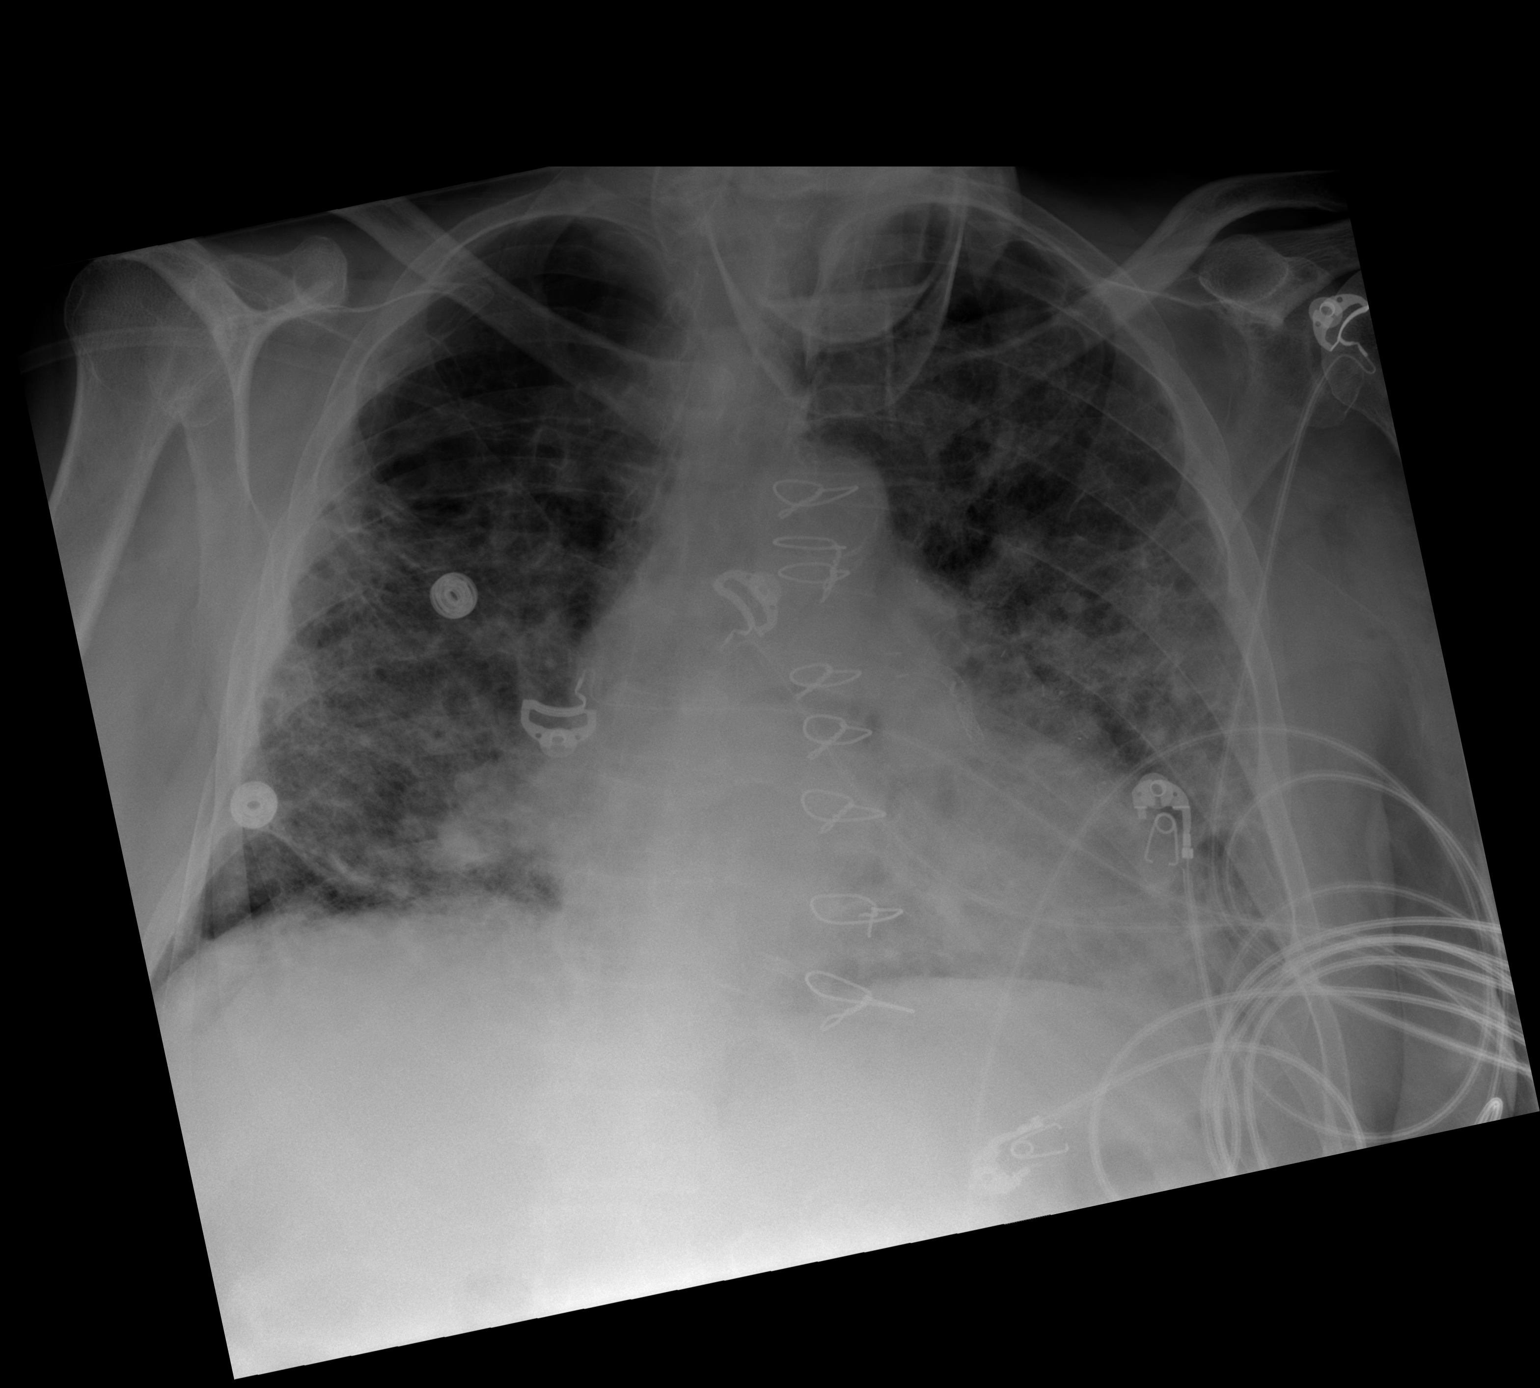

[2 of 2 positions shown; findings below may reference images not displayed]

FINDINGS: Cardiac silhouette is similarly enlarged. Calcified aortic knob.
Status post median sternotomy for CABG with coronary artery stent.
Diffuse interstitial prominence with RIGHT greater LEFT apical
bullous changes. Flattened hemidiaphragms. Similar scarring RIGHT
mid lung zone. No pleural effusion or focal consolidation. No
pneumothorax. Soft tissue planes and included osseous structures are
unchanged.
IMPRESSION: Stable interstitial prominence concerning for chronic interstitial
lung disease superimposed on COPD.

Stable cardiomegaly, status post CABG and coronary artery stenting.

Aortic Atherosclerosis ([id number].I).

## 2018-10-21 IMAGING — MR MR FOOT*R* WO/W CM
4 of 10 series · 19 of 40 positions shown · IV contrast (multihance)
Comparison: Right foot x-rays from yesterday.

CLINICAL DATA: Left great toe ulcer.  Evaluate for osteomyelitis.

EXAM:
MRI OF THE RIGHT FOREFOOT WITHOUT AND WITH CONTRAST
TECHNIQUE: Multiplanar, multisequence MR imaging of the right forefoot was
performed before and after the administration of intravenous
contrast.
CONTRAST:  18mL MULTIHANCE GADOBENATE DIMEGLUMINE 529 MG/ML IV SOLN

[Series 4: T1 · coronal · 4.0mm · 0.29mm/px · 6 of 36 slices shown]
[im 1/36]
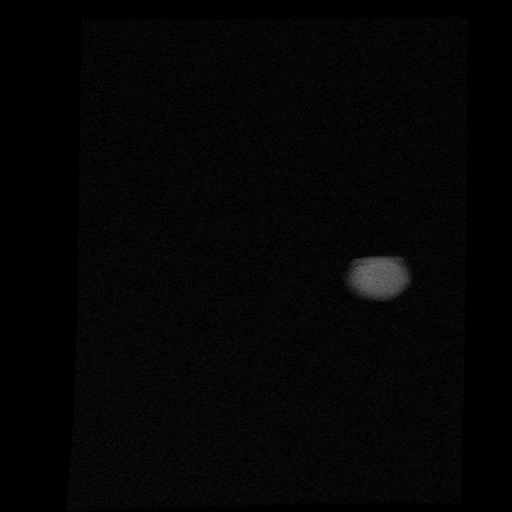
[im 8/36]
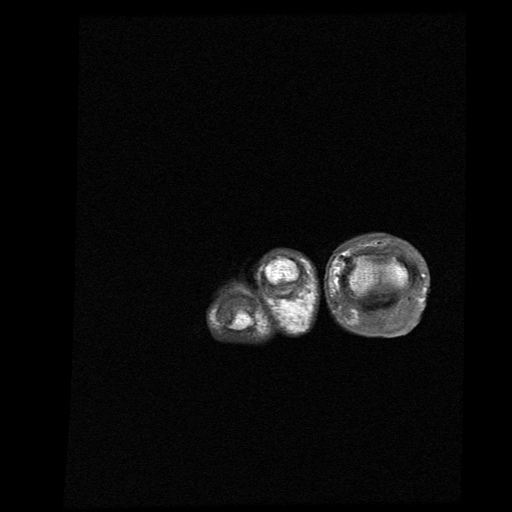
[im 15/36]
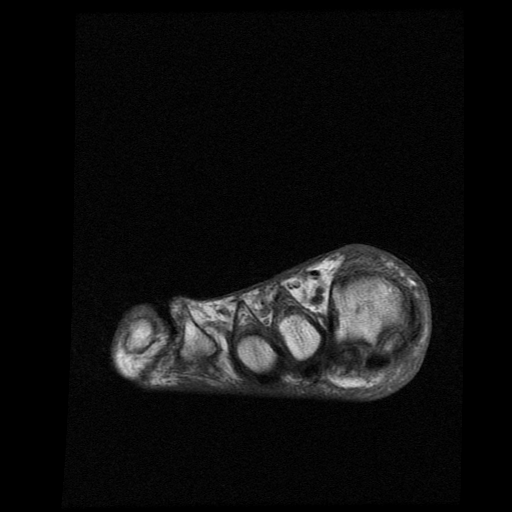
[im 22/36]
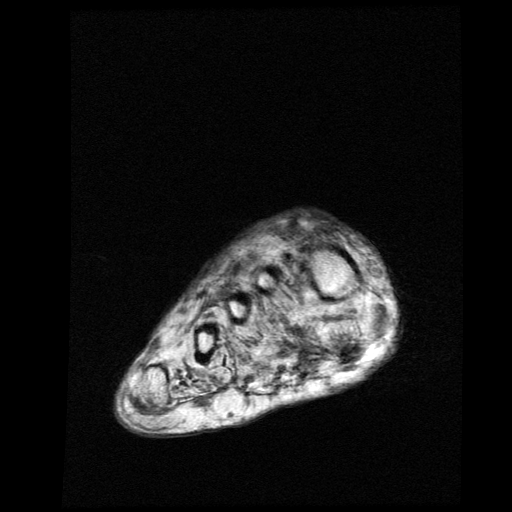
[im 29/36]
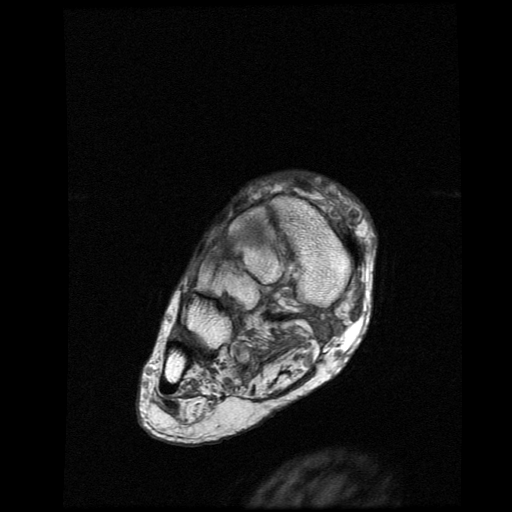
[im 36/36]
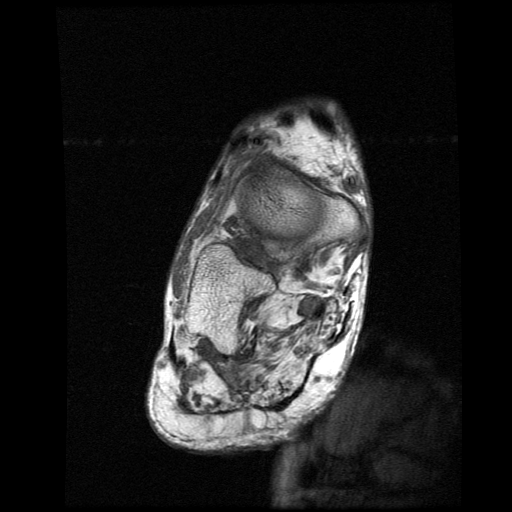

[Series 5: T1 fat-sat · coronal · non-contrast · 4.0mm · 0.29mm/px · 3 of 36 slices shown]
[im 8/36]
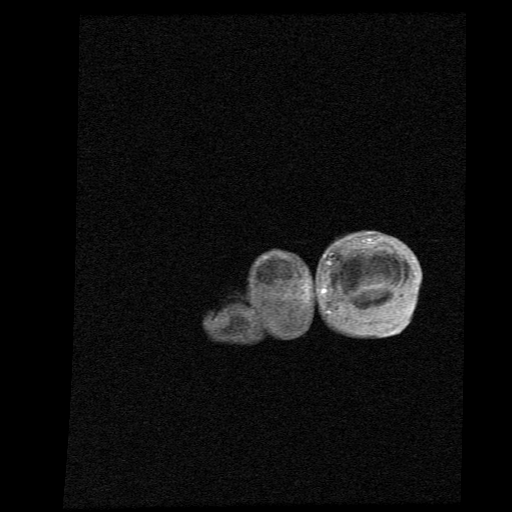
[im 22/36]
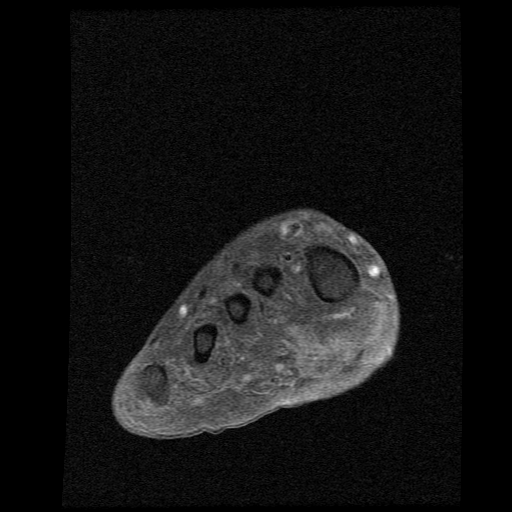
[im 36/36]
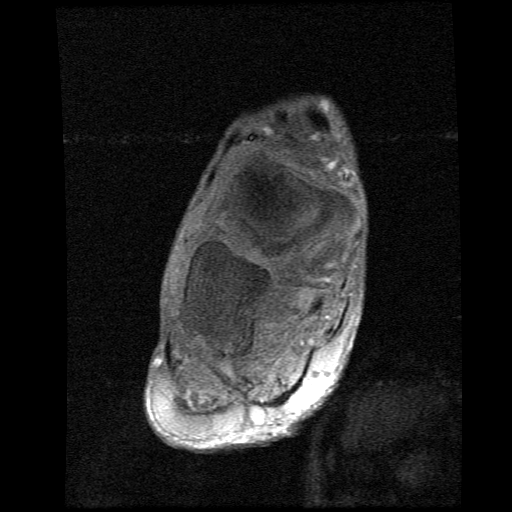

[Series 6: T2 fat-sat · coronal · 4.0mm · 0.29mm/px · 5 of 36 slices shown (1 of 2)]
[im 1/36]
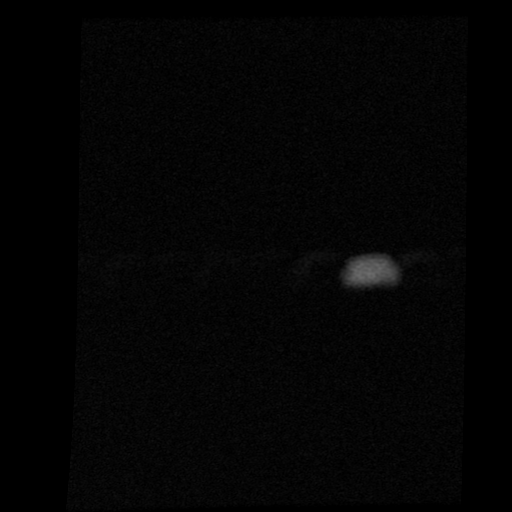
[im 9/36]
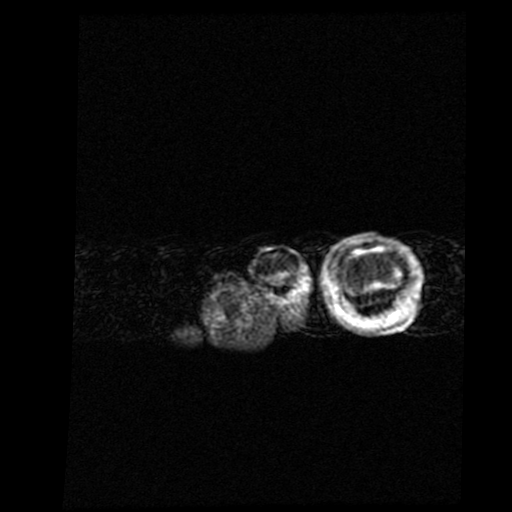
[im 18/36]
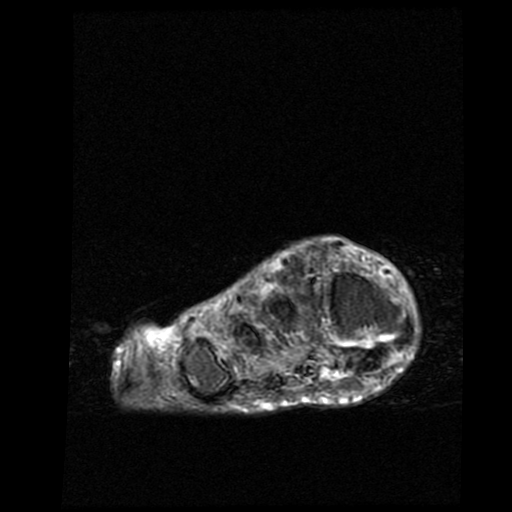
[im 27/36]
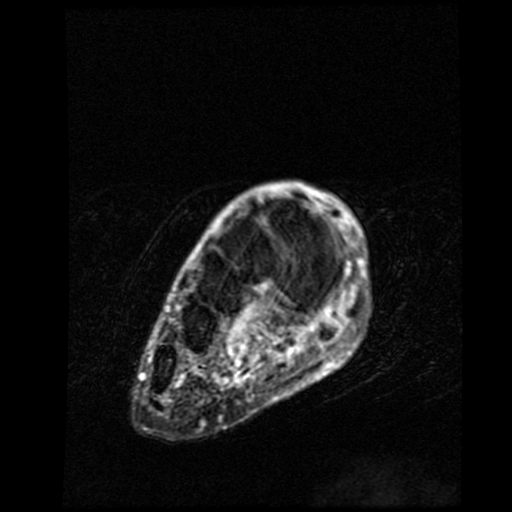
[im 36/36]
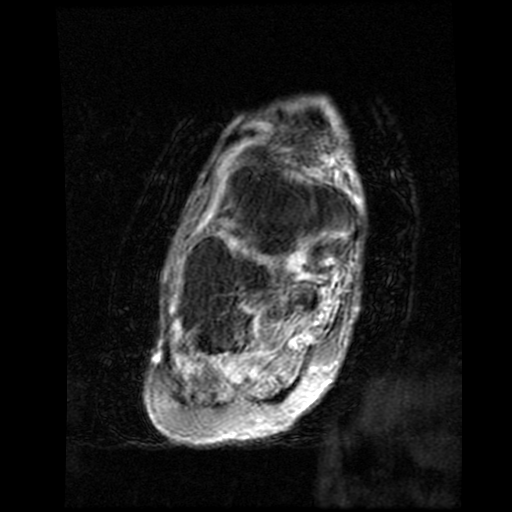

[Series 8: T2 fat-sat · coronal · 4.0mm · 0.29mm/px · 5 of 36 slices shown (2 of 2)]
[im 1/36]
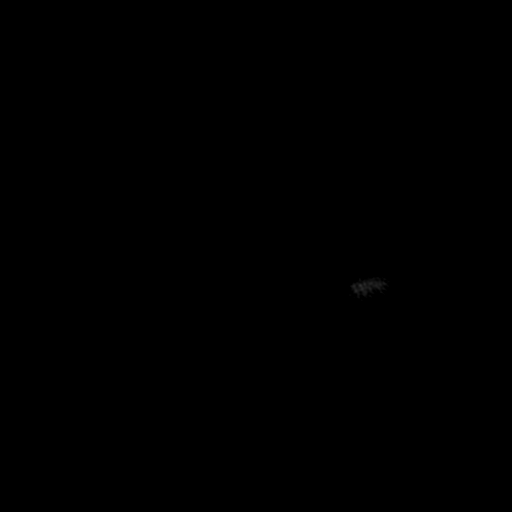
[im 9/36]
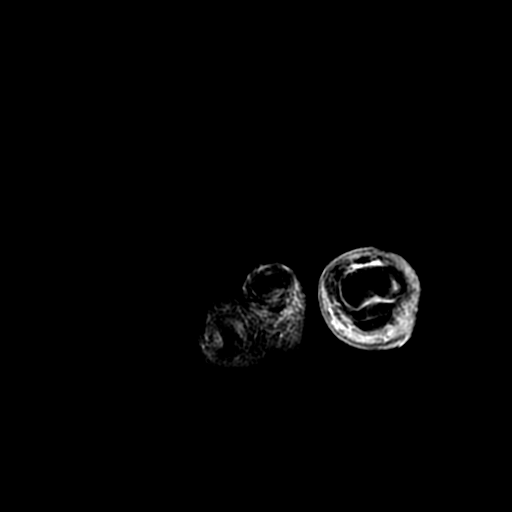
[im 18/36]
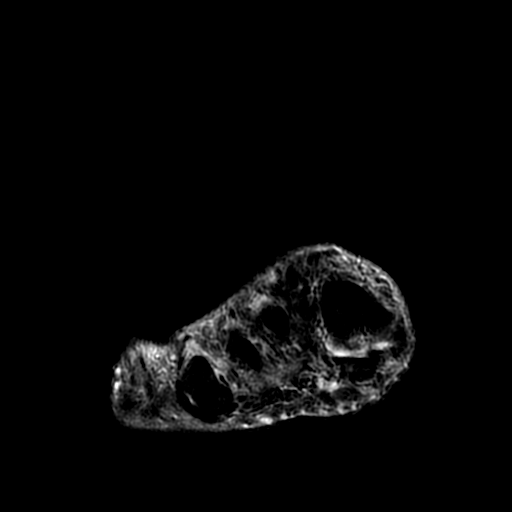
[im 27/36]
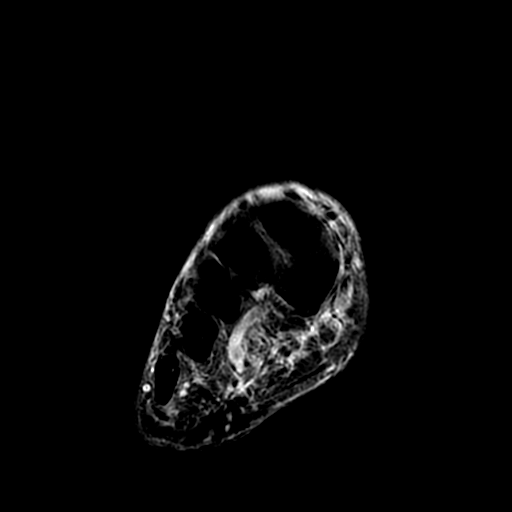
[im 36/36]
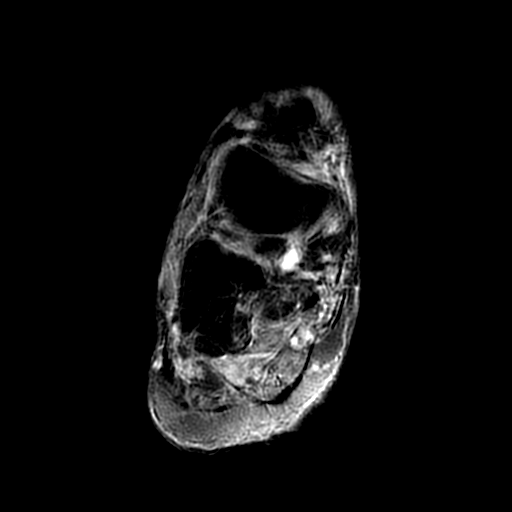

[19 of 40 positions shown; findings below may reference images not displayed]

FINDINGS: Bones/Joint/Cartilage

No suspicious marrow signal abnormality. Mild degenerative changes
of the first MTP and IP joints. No fracture or dislocation.

Ligaments

The Lisfranc ligament is intact. The collateral ligaments are
intact.

Muscles and Tendons

Severe fatty atrophy of the intrinsic muscles of the forefoot. The
visualized flexor and extensor tendons are intact.

Soft tissues

Soft tissue ulceration along the medial aspect of the first distal
phalanx. Mild surrounding soft tissue enhancement. No drainable
fluid collection
IMPRESSION: 1. Soft tissue ulceration along the medial aspect of the great toe
at the level of the first distal phalanx. No evidence of
osteomyelitis or drainable fluid collection.
